# Patient Record
Sex: Female | Born: 1965 | Race: White | Hispanic: No
Health system: Southern US, Community
[De-identification: ages and names within clinical notes are randomized; demographics above are authoritative.]

## PROBLEM LIST (undated history)

## (undated) DIAGNOSIS — D649 Anemia, unspecified: Secondary | ICD-10-CM

## (undated) DIAGNOSIS — N189 Chronic kidney disease, unspecified: Secondary | ICD-10-CM

## (undated) DIAGNOSIS — Z780 Asymptomatic menopausal state: Secondary | ICD-10-CM

## (undated) DIAGNOSIS — W57XXXA Bitten or stung by nonvenomous insect and other nonvenomous arthropods, initial encounter: Secondary | ICD-10-CM

## (undated) DIAGNOSIS — I219 Acute myocardial infarction, unspecified: Secondary | ICD-10-CM

## (undated) DIAGNOSIS — C4490 Unspecified malignant neoplasm of skin, unspecified: Secondary | ICD-10-CM

## (undated) DIAGNOSIS — Z8742 Personal history of other diseases of the female genital tract: Secondary | ICD-10-CM

## (undated) DIAGNOSIS — F329 Major depressive disorder, single episode, unspecified: Secondary | ICD-10-CM

## (undated) DIAGNOSIS — N951 Menopausal and female climacteric states: Secondary | ICD-10-CM

## (undated) DIAGNOSIS — T7840XA Allergy, unspecified, initial encounter: Secondary | ICD-10-CM

## (undated) DIAGNOSIS — J449 Chronic obstructive pulmonary disease, unspecified: Secondary | ICD-10-CM

## (undated) DIAGNOSIS — R32 Unspecified urinary incontinence: Secondary | ICD-10-CM

## (undated) DIAGNOSIS — E079 Disorder of thyroid, unspecified: Secondary | ICD-10-CM

## (undated) DIAGNOSIS — F419 Anxiety disorder, unspecified: Secondary | ICD-10-CM

## (undated) DIAGNOSIS — M1990 Unspecified osteoarthritis, unspecified site: Secondary | ICD-10-CM

## (undated) DIAGNOSIS — F32A Depression, unspecified: Secondary | ICD-10-CM

## (undated) DIAGNOSIS — R011 Cardiac murmur, unspecified: Secondary | ICD-10-CM

## (undated) DIAGNOSIS — E7800 Pure hypercholesterolemia, unspecified: Secondary | ICD-10-CM

## (undated) DIAGNOSIS — C801 Malignant (primary) neoplasm, unspecified: Secondary | ICD-10-CM

## (undated) HISTORY — PX: ANKLE SURGERY: SHX546

## (undated) HISTORY — PX: APPENDECTOMY: SHX54

## (undated) HISTORY — DX: Acute myocardial infarction, unspecified: I21.9

## (undated) HISTORY — DX: Cardiac murmur, unspecified: R01.1

## (undated) HISTORY — DX: Pure hypercholesterolemia, unspecified: E78.00

## (undated) HISTORY — DX: Unspecified osteoarthritis, unspecified site: M19.90

## (undated) HISTORY — DX: Chronic obstructive pulmonary disease, unspecified: J44.9

## (undated) HISTORY — DX: Depression, unspecified: F32.A

## (undated) HISTORY — DX: Menopausal and female climacteric states: N95.1

## (undated) HISTORY — DX: Personal history of other diseases of the female genital tract: Z87.42

## (undated) HISTORY — DX: Disorder of thyroid, unspecified: E07.9

## (undated) HISTORY — PX: BUNIONECTOMY: SHX129

## (undated) HISTORY — PX: WISDOM TOOTH EXTRACTION: SHX21

## (undated) HISTORY — PX: AUGMENTATION MAMMAPLASTY: SUR837

## (undated) HISTORY — DX: Anxiety disorder, unspecified: F41.9

## (undated) HISTORY — PX: BREAST ENHANCEMENT SURGERY: SHX7

## (undated) HISTORY — DX: Allergy, unspecified, initial encounter: T78.40XA

## (undated) HISTORY — DX: Anemia, unspecified: D64.9

## (undated) HISTORY — DX: Major depressive disorder, single episode, unspecified: F32.9

## (undated) HISTORY — DX: Unspecified urinary incontinence: R32

## (undated) HISTORY — DX: Chronic kidney disease, unspecified: N18.9

## (undated) HISTORY — PX: TOTAL ABDOMINAL HYSTERECTOMY W/ BILATERAL SALPINGOOPHORECTOMY: SHX83

---
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
## 1998-04-06 ENCOUNTER — Emergency Department (HOSPITAL_COMMUNITY): Admission: EM | Admit: 1998-04-06 | Discharge: 1998-04-06 | Payer: Self-pay | Admitting: Internal Medicine

## 1998-09-07 ENCOUNTER — Emergency Department (HOSPITAL_COMMUNITY): Admission: EM | Admit: 1998-09-07 | Discharge: 1998-09-07 | Payer: Self-pay | Admitting: Emergency Medicine

## 1998-11-01 ENCOUNTER — Emergency Department (HOSPITAL_COMMUNITY): Admission: EM | Admit: 1998-11-01 | Discharge: 1998-11-01 | Payer: Self-pay

## 1999-06-29 ENCOUNTER — Emergency Department (HOSPITAL_COMMUNITY): Admission: EM | Admit: 1999-06-29 | Discharge: 1999-06-29 | Payer: Self-pay | Admitting: Emergency Medicine

## 2002-10-17 ENCOUNTER — Other Ambulatory Visit: Admission: RE | Admit: 2002-10-17 | Discharge: 2002-10-17 | Payer: Self-pay | Admitting: Family Medicine

## 2002-10-17 ENCOUNTER — Encounter: Payer: Self-pay | Admitting: Family Medicine

## 2002-10-17 LAB — CONVERTED CEMR LAB: Pap Smear: NORMAL

## 2004-11-23 ENCOUNTER — Ambulatory Visit: Payer: Self-pay | Admitting: Family Medicine

## 2004-12-07 ENCOUNTER — Encounter: Admission: RE | Admit: 2004-12-07 | Discharge: 2004-12-07 | Payer: Self-pay | Admitting: Family Medicine

## 2005-02-22 ENCOUNTER — Ambulatory Visit: Payer: Self-pay | Admitting: Family Medicine

## 2005-03-03 ENCOUNTER — Ambulatory Visit (HOSPITAL_COMMUNITY): Admission: RE | Admit: 2005-03-03 | Discharge: 2005-03-03 | Payer: Self-pay | Admitting: Gynecology

## 2005-03-03 ENCOUNTER — Encounter (INDEPENDENT_AMBULATORY_CARE_PROVIDER_SITE_OTHER): Payer: Self-pay | Admitting: *Deleted

## 2005-05-04 ENCOUNTER — Ambulatory Visit: Payer: Self-pay | Admitting: Family Medicine

## 2005-08-08 ENCOUNTER — Ambulatory Visit (HOSPITAL_COMMUNITY): Admission: RE | Admit: 2005-08-08 | Discharge: 2005-08-08 | Payer: Self-pay | Admitting: Gynecology

## 2005-08-11 ENCOUNTER — Encounter (INDEPENDENT_AMBULATORY_CARE_PROVIDER_SITE_OTHER): Payer: Self-pay | Admitting: Specialist

## 2005-08-11 ENCOUNTER — Ambulatory Visit (HOSPITAL_COMMUNITY): Admission: RE | Admit: 2005-08-11 | Discharge: 2005-08-11 | Payer: Self-pay | Admitting: Gynecology

## 2005-10-30 HISTORY — PX: OTHER SURGICAL HISTORY: SHX169

## 2005-12-08 ENCOUNTER — Ambulatory Visit: Payer: Self-pay | Admitting: Family Medicine

## 2006-02-19 ENCOUNTER — Ambulatory Visit: Payer: Self-pay | Admitting: Family Medicine

## 2006-03-19 ENCOUNTER — Ambulatory Visit: Payer: Self-pay | Admitting: Gynecology

## 2006-04-12 ENCOUNTER — Ambulatory Visit (HOSPITAL_COMMUNITY): Admission: RE | Admit: 2006-04-12 | Discharge: 2006-04-13 | Payer: Self-pay | Admitting: Gynecology

## 2006-04-13 ENCOUNTER — Ambulatory Visit: Payer: Self-pay | Admitting: Gynecology

## 2006-04-19 ENCOUNTER — Ambulatory Visit: Payer: Self-pay | Admitting: Family Medicine

## 2006-05-07 ENCOUNTER — Ambulatory Visit: Payer: Self-pay | Admitting: Gynecology

## 2006-05-07 ENCOUNTER — Ambulatory Visit: Payer: Self-pay | Admitting: Family Medicine

## 2006-06-01 ENCOUNTER — Ambulatory Visit: Payer: Self-pay | Admitting: Gynecology

## 2006-06-18 ENCOUNTER — Ambulatory Visit: Payer: Self-pay | Admitting: Gynecology

## 2006-07-11 ENCOUNTER — Ambulatory Visit: Payer: Self-pay | Admitting: Gynecology

## 2006-08-06 ENCOUNTER — Ambulatory Visit: Payer: Self-pay | Admitting: Gynecology

## 2006-08-23 ENCOUNTER — Ambulatory Visit: Payer: Self-pay | Admitting: Family Medicine

## 2006-09-04 ENCOUNTER — Ambulatory Visit: Payer: Self-pay | Admitting: Family Medicine

## 2006-09-10 ENCOUNTER — Ambulatory Visit: Payer: Self-pay | Admitting: Gynecology

## 2006-10-02 ENCOUNTER — Encounter: Admission: RE | Admit: 2006-10-02 | Discharge: 2006-10-02 | Payer: Self-pay | Admitting: Gynecology

## 2006-10-08 ENCOUNTER — Ambulatory Visit: Payer: Self-pay | Admitting: Gynecology

## 2006-11-08 ENCOUNTER — Ambulatory Visit: Payer: Self-pay | Admitting: Obstetrics & Gynecology

## 2006-12-10 ENCOUNTER — Ambulatory Visit: Payer: Self-pay | Admitting: Gynecology

## 2007-01-07 ENCOUNTER — Ambulatory Visit: Payer: Self-pay | Admitting: Gynecology

## 2007-02-07 ENCOUNTER — Ambulatory Visit: Payer: Self-pay | Admitting: Gynecology

## 2007-04-09 ENCOUNTER — Ambulatory Visit: Payer: Self-pay | Admitting: Gynecology

## 2007-05-09 ENCOUNTER — Encounter: Payer: Self-pay | Admitting: Family Medicine

## 2007-05-09 DIAGNOSIS — I059 Rheumatic mitral valve disease, unspecified: Secondary | ICD-10-CM | POA: Insufficient documentation

## 2007-05-09 DIAGNOSIS — I341 Nonrheumatic mitral (valve) prolapse: Secondary | ICD-10-CM | POA: Insufficient documentation

## 2007-05-09 DIAGNOSIS — G43109 Migraine with aura, not intractable, without status migrainosus: Secondary | ICD-10-CM | POA: Insufficient documentation

## 2007-05-13 ENCOUNTER — Ambulatory Visit: Payer: Self-pay | Admitting: Family Medicine

## 2007-05-13 DIAGNOSIS — R221 Localized swelling, mass and lump, neck: Secondary | ICD-10-CM | POA: Insufficient documentation

## 2007-05-13 DIAGNOSIS — R220 Localized swelling, mass and lump, head: Secondary | ICD-10-CM | POA: Insufficient documentation

## 2007-05-13 LAB — CONVERTED CEMR LAB
Free T4: 0.6 ng/dL (ref 0.6–1.6)
T3, Free: 2.2 pg/mL — ABNORMAL LOW (ref 2.3–4.2)
TSH: 1.75 microintl units/mL (ref 0.35–5.50)

## 2007-05-21 ENCOUNTER — Ambulatory Visit: Payer: Self-pay | Admitting: Family Medicine

## 2007-05-21 DIAGNOSIS — R5381 Other malaise: Secondary | ICD-10-CM | POA: Insufficient documentation

## 2007-05-21 DIAGNOSIS — R5383 Other fatigue: Secondary | ICD-10-CM

## 2007-09-09 ENCOUNTER — Ambulatory Visit: Payer: Self-pay | Admitting: Gynecology

## 2007-10-08 ENCOUNTER — Encounter: Admission: RE | Admit: 2007-10-08 | Discharge: 2007-10-08 | Payer: Self-pay | Admitting: Gynecology

## 2007-10-16 ENCOUNTER — Ambulatory Visit: Payer: Self-pay | Admitting: Family Medicine

## 2007-10-16 LAB — CONVERTED CEMR LAB: Inflenza A Ag: NEGATIVE

## 2007-12-10 ENCOUNTER — Ambulatory Visit: Payer: Self-pay | Admitting: Internal Medicine

## 2008-01-02 ENCOUNTER — Ambulatory Visit: Payer: Self-pay | Admitting: Family Medicine

## 2008-01-02 DIAGNOSIS — B9789 Other viral agents as the cause of diseases classified elsewhere: Secondary | ICD-10-CM | POA: Insufficient documentation

## 2008-01-15 ENCOUNTER — Telehealth (INDEPENDENT_AMBULATORY_CARE_PROVIDER_SITE_OTHER): Payer: Self-pay | Admitting: Internal Medicine

## 2008-01-16 ENCOUNTER — Ambulatory Visit: Payer: Self-pay | Admitting: Family Medicine

## 2008-02-11 ENCOUNTER — Ambulatory Visit: Payer: Self-pay | Admitting: Family Medicine

## 2008-02-21 ENCOUNTER — Encounter: Admission: RE | Admit: 2008-02-21 | Discharge: 2008-02-21 | Payer: Self-pay | Admitting: Family Medicine

## 2008-05-28 ENCOUNTER — Ambulatory Visit: Payer: Self-pay | Admitting: Family Medicine

## 2008-07-02 ENCOUNTER — Ambulatory Visit: Payer: Self-pay | Admitting: Family Medicine

## 2008-07-02 DIAGNOSIS — M25519 Pain in unspecified shoulder: Secondary | ICD-10-CM | POA: Insufficient documentation

## 2008-07-02 DIAGNOSIS — M629 Disorder of muscle, unspecified: Secondary | ICD-10-CM | POA: Insufficient documentation

## 2008-07-02 DIAGNOSIS — IMO0002 Reserved for concepts with insufficient information to code with codable children: Secondary | ICD-10-CM | POA: Insufficient documentation

## 2008-07-02 DIAGNOSIS — S4350XA Sprain of unspecified acromioclavicular joint, initial encounter: Secondary | ICD-10-CM | POA: Insufficient documentation

## 2008-07-02 DIAGNOSIS — M25569 Pain in unspecified knee: Secondary | ICD-10-CM | POA: Insufficient documentation

## 2008-07-08 ENCOUNTER — Ambulatory Visit: Payer: Self-pay | Admitting: Family Medicine

## 2008-07-09 ENCOUNTER — Telehealth (INDEPENDENT_AMBULATORY_CARE_PROVIDER_SITE_OTHER): Payer: Self-pay | Admitting: *Deleted

## 2008-07-12 ENCOUNTER — Encounter: Payer: Self-pay | Admitting: Family Medicine

## 2008-07-12 ENCOUNTER — Encounter: Admission: RE | Admit: 2008-07-12 | Discharge: 2008-07-12 | Payer: Self-pay | Admitting: Family Medicine

## 2008-07-15 ENCOUNTER — Encounter: Payer: Self-pay | Admitting: Family Medicine

## 2008-07-22 ENCOUNTER — Encounter (INDEPENDENT_AMBULATORY_CARE_PROVIDER_SITE_OTHER): Payer: Self-pay | Admitting: *Deleted

## 2008-09-14 ENCOUNTER — Ambulatory Visit: Payer: Self-pay | Admitting: Gynecology

## 2008-10-08 ENCOUNTER — Encounter: Admission: RE | Admit: 2008-10-08 | Discharge: 2008-10-08 | Payer: Self-pay | Admitting: Gynecology

## 2009-08-17 ENCOUNTER — Ambulatory Visit: Payer: Self-pay | Admitting: Obstetrics & Gynecology

## 2009-08-18 ENCOUNTER — Encounter: Payer: Self-pay | Admitting: Obstetrics & Gynecology

## 2010-02-07 ENCOUNTER — Encounter: Admission: RE | Admit: 2010-02-07 | Discharge: 2010-02-07 | Payer: Self-pay | Admitting: Obstetrics & Gynecology

## 2010-02-15 ENCOUNTER — Telehealth: Payer: Self-pay | Admitting: Family Medicine

## 2010-04-29 ENCOUNTER — Encounter: Admission: RE | Admit: 2010-04-29 | Discharge: 2010-04-29 | Payer: Self-pay | Admitting: Sports Medicine

## 2010-06-02 ENCOUNTER — Encounter (INDEPENDENT_AMBULATORY_CARE_PROVIDER_SITE_OTHER): Payer: Self-pay | Admitting: *Deleted

## 2010-11-20 ENCOUNTER — Encounter: Payer: Self-pay | Admitting: Gynecology

## 2010-11-21 ENCOUNTER — Encounter: Payer: Self-pay | Admitting: Obstetrics and Gynecology

## 2010-12-01 NOTE — Assessment & Plan Note (Signed)
Summary: ear infect.?/bir   Vital Signs:  Patient Profile:   45 Years Old Female Weight:      132 pounds Temp:     98.3 degrees F oral Pulse rate:   56 / minute Pulse rhythm:   regular BP sitting:   100 / 80  (right arm) Cuff size:   regular  Vitals Entered ByMarland Kitchen Providence Crosby (May 13, 2007 12:30 PM)               Chief Complaint:  RIGHT EAR PAIN.  History of Present Illness: R ear hurts down along the anterior cerv chain with balance problems...tripping over own feet., falling up steps approx one month.   Ear pain approx 1 week.  Current Allergies: No known allergies         Impression & Recommendations:  Problem # 1:  SYMPTOM, SWELLING IN HEAD/NECK (ICD-784.2) Assessment: New R/o Thyroid dz Orders: Ultrasound (Ultrasound) of thyroid. Venipuncture [phone]) TLB-TSH (Thyroid Stimulating Hormone) (84443-TSH) TLB-T3, Free (Triiodothyronine) (84481-T3FREE) TLB-T4 (Thyrox), Free [phone]) RTC 1 week.  Orders: Ultrasound (Ultrasound) Venipuncture (19147) TLB-TSH (Thyroid Stimulating Hormone) (84443-TSH) TLB-T3, Free (Triiodothyronine) (84481-T3FREE) TLB-T4 (Thyrox), Free [phone])   Problem # 2:  INFECTION, UP RESPIRAT, MLT SITES, ACUTE NOS (ICD-465.9) Assessment: New .guaifenesin as directed.    Advil 200 Mg Tabs (Ibuprofen) .Marland Kitchen... As needed Instructed on symptomatic treatment. Call if symptoms persist or worsen.  Her updated medication list for this problem includes:    Advil 200 Mg Tabs (Ibuprofen) .Marland Kitchen... As needed   Medications Added to Medication List This Visit: 1)  Hormone Injection  .... Every month 2)  Augmentin 500-125 Mg Tabs (Amoxicillin-pot clavulanate) .... T   Patient Instructions: 1)  GUAIFENESIN  600mg  by mouth AM and NOON   2)    ROBITUSSIN PLAIN (NO LETTERS, NO NAMES) two tablespoons  or 3)    [facility] MUCOUS RELIEF EXPECTORANT (400 mg) 11/2 TABS  or 4)    GUAIFENESIN (200 MG) 3 TABS  5)  Thyroid U/S                    6)  RTC 1  week.

## 2010-12-01 NOTE — Assessment & Plan Note (Signed)
Summary: HA X 3 DAYS/CLE   Vital Signs:  Patient Profile:   45 Years Old Female Weight:      130 pounds Temp:     98.2 degrees F oral Pulse rate:   73 / minute Pulse rhythm:   regular Resp:     16 per minute BP sitting:   107 / 76  (left arm) Cuff size:   regular  Vitals Entered By: Cooper Render (December 10, 2007 3:36 PM)                 Visit Type:  Acute PCP:  Hetty Ely  Chief Complaint:  migraine  x 3 days and vomiting x 3 yest & 2 x today.  History of Present Illness: Has had migraine headache x 3 days that has been accompanied by nausea and vomiting and has not responded to the Excedrin Migraine meds. Has had a little visual sx's as well. Rates pain as 7/10. Has been several months since patient had a migraine this bad. Patient lying on the exam table with the room darkened and her husband is sitting in the corner.    Prior Medication List:  CRESTOR 10 MG  TABS (ROSUVASTATIN CALCIUM) Take one by mouth daily ENJUVIA 1.25 MG  TABS (ESTROGENS CONJ SYNTHETIC B) Take one by mouth two times a day ADVIL 200 MG  TABS (IBUPROFEN) as needed EXCEDRIN MIGRAINE 250-250-65 MG  TABS (ASPIRIN-ACETAMINOPHEN-CAFFEINE) as needed AMOXICILLIN 500 MG  CAPS (AMOXICILLIN) 2 bid HYCODAN 5-1.5 MG/5ML  SYRP (HYDROCODONE-HOMATROPINE) 1 tsp at bedtime for cough, may repeat in 4-6h as needed.  Caution re drowsiness, no driving   Current Allergies (reviewed today): No known allergies   Past Surgical History:    Reviewed history from 05/21/2007 and no changes required:       Breast Augmentation 02/1999       Pelvic U/S, increased endometr. lining, RF Dr. 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       Siblings: Brother alive and well and a sister who died at 69 years of age of a motor vehicle accident and a sister with depression who has quit cocaine.  Social History:    Reviewed history from 05/09/2007 and no changes required:       Marital Status: Remarried       Children: 2 Children by her first marriage, 2 stepsons       Occupation: Housewife   Risk Factors:  Seatbelt use:  100 %   Review of Systems  The patient denies fever, chest pain, dyspnea on exhertion, peripheral edema, prolonged cough, abdominal pain, and severe indigestion/heartburn.         Complains of headache with nausea and vomiting x 3 days.   Physical Exam  General:  alert, well-developed, well-nourished, well-hydrated, appropriate dress, normal appearance, healthy-appearing, cooperative to examination, and good hygiene.   Neck:     supple and full ROM.   Lungs:     normal respiratory effort, no accessory muscle use, and normal breath sounds.   Heart:     normal rate and regular rhythm.   Neurologic:     cranial nerves III-XII intact and strength normal in all extremities.  Visual sx's. Psych:     Oriented X3 and subdued.  Continous headache x this is the third day.     Impression & Recommendations:  Problem # 1:  MIGRAINE HEADACHE (ICD-346.90) Onset 3 days ago with nausea and vomiting. Stadol 60 mg IM now. Phenergan 25 mg IM now. Phenergan 25 mg by mouth every 6 hours as needed nausea/ vomiting. Her updated medication list for this problem includes:    Advil 200 Mg Tabs (Ibuprofen) .Marland Kitchen... As needed    Excedrin Migraine 250-250-65 Mg Tabs (Aspirin-acetaminophen-caffeine) .Marland Kitchen... As needed  Orders: Ketorolac-Toradol 15mg  (Z6109) Promethazine up to 50mg  (J2550) Admin of  Therapeutic Inj  intramuscular or subcutaneous (60454)   Complete Medication List: 1)  Crestor 10 Mg Tabs (Rosuvastatin calcium) .... Take one by mouth daily 2)  Enjuvia 1.25 Mg Tabs (Estrogens conj synthetic b) .... Take one by mouth two times a day 3)  Advil 200 Mg Tabs (Ibuprofen) .... As needed 4)  Excedrin Migraine 250-250-65 Mg Tabs (Aspirin-acetaminophen-caffeine) .... As needed 5)  Promethazine Hcl 25 Mg Tabs (Promethazine hcl) .... Take 1 tab every 6 hours as needed for nausea.   Patient Instructions: 1)  Stadol 60 mg IM given. 2)  Phenergan 25 mg IM given. 3)  Go home and sleep. dark, quiet and cool place. 4)  Phenergan 25 mg by mouth every 6 hours as needed for nausea. 5)  Return to check as needed.    Prescriptions: PROMETHAZINE HCL 25 MG  TABS (PROMETHAZINE HCL) Take 1 tab every 6 hours as needed for nausea.  #12 x 0   Entered and Authorized by:   Kathie Rhodes NP   Signed by:   Kathie Rhodes NP on 12/10/2007   Method used:   Print then Give to Patient   RxID:   [id number]  ] Prior Medications (reviewed today): CRESTOR 10 MG  TABS (ROSUVASTATIN CALCIUM) Take one by mouth daily ENJUVIA 1.25 MG  TABS (ESTROGENS CONJ SYNTHETIC B) Take one by mouth two times a day ADVIL 200 MG  TABS (IBUPROFEN) as needed EXCEDRIN MIGRAINE 250-250-65 MG  TABS (ASPIRIN-ACETAMINOPHEN-CAFFEINE) as needed PROMETHAZINE HCL 25 MG  TABS (PROMETHAZINE HCL) Take 1 tab every 6 hours as needed for nausea. Current Allergies (reviewed today): No known allergies   Medication Administration  Injection # 1:    Medication: Ketorolac-Toradol 15mg     Diagnosis: MIGRAINE HEADACHE (ICD-346.90)    Route: IM    Site: LUOQ gluteus    Exp Date: 09/29/2008    Lot #: [id number]    Mfr: hospira    Comments: toradol 60 mg 2 ml    Patient tolerated injection without complications    Given by: Cooper Render (December 10, 2007 4:42 PM)  Injection # 2:    Medication: Promethazine up to 50mg      Diagnosis: MIGRAINE HEADACHE (ICD-346.90)    Route: IM    Site: RUOQ gluteus    Exp Date: 02/28/2008    Lot #: [id number]    Mfr: [email or web address]    Comments: 25 ml    Patient tolerated  injection without complications    Given by: Cooper Render (December 10, 2007 4:46 PM)

## 2010-12-01 NOTE — Letter (Signed)
Summary: [place] No Show Letter  [place] [facility]  [address] [place], [place] [address]   Phone: [phone]  Fax: [phone]    07/22/2008 MRN: [id number]  [name] [address] [place], [place]  [address]   Dear Ms. DILLS,   Our records indicate that you missed your scheduled appointment with Dr. Patsy Lager on July 22, 2008.  Please contact this office to reschedule your appointment as soon as possible.  It is important that you keep your scheduled appointments with your physician, so we can provide you the best care possible.  Please be advised that there may be a charge for "no show" appointments.    Sincerely,   [place] [facility]

## 2010-12-01 NOTE — Progress Notes (Signed)
Summary: Rx-Hydrocodone-APAP  Phone Note Refill Request Message from:  Fax from Pharmacy on July 09, 2008 8:56 AM  Not on med list. Hydrocodone-APAP 5/500mg  #30 take 1-2 tablets by mouth every 6 hours as needed for pain. [facility] [address] [phone]  Initial call taken by: Silas [place],  July 09, 2008 8:57 AM  Follow-up for Phone Call        I saw this patient yesterday and prescribed Ultram and Voltaren. Will not refill vicodin - discussed with pt. Follow-up by: Hannah Beat MD,  July 09, 2008 9:00 AM  Additional Follow-up for Phone Call Additional follow up Details #1::        called pharmacy and let them know that it was denied. Additional Follow-up by: Silas Sacramento,  July 09, 2008 9:17 AM

## 2010-12-01 NOTE — Progress Notes (Signed)
Summary: Referral to Dr. Sandria Manly  Phone Note Call from Patient Call back at [phone]   Caller: Patient Call For: Natalie Coombe, FNP Summary of Call: Pt needs a referral to Dr. Sandria Manly, a neurologist for her migraines. Initial call taken by: Sydell Axon,  January 15, 2008 4:56 PM  Follow-up for Phone Call        refer to Dr Sandria Manly for eval of migraine  ..................................................................Marland KitchenBillie-Khiley Tyler Deis FNP  January 15, 2008 5:42 PM

## 2010-12-01 NOTE — Assessment & Plan Note (Signed)
Summary: FLU? DLO   Vital Signs:  Patient Profile:   45 Years Old Female Weight:      134 pounds Temp:     97.9 degrees F oral Pulse rate:   66 / minute BP sitting:   144 / 84  (right arm) Cuff size:   regular  Vitals Entered By: Cooper Render (October 16, 2007 11:04 AM)                 Chief Complaint:  URI sx, cough, worse at night, fever, and dtr had a & b flu strain.  History of Present Illness: Here due to fever, cough--ribs hurt to cough, going on for 2 wks.  Has used Nyquil and Dayquil.  Getting worse. no wheezing.  Daughter had a pos flu test 3 wks ago.  Current Allergies (reviewed today): No known allergies  Updated/Current Medications (including changes made in today's visit):  CRESTOR 10 MG  TABS (ROSUVASTATIN CALCIUM) Take one by mouth daily ENJUVIA 1.25 MG  TABS (ESTROGENS CONJ SYNTHETIC B) Take one by mouth two times a day ADVIL 200 MG  TABS (IBUPROFEN) as needed EXCEDRIN MIGRAINE 250-250-65 MG  TABS (ASPIRIN-ACETAMINOPHEN-CAFFEINE) as needed AMOXICILLIN 500 MG  CAPS (AMOXICILLIN) 2 bid HYCODAN 5-1.5 MG/5ML  SYRP (HYDROCODONE-HOMATROPINE) 1 tsp at bedtime for cough, may repeat in 4-6h as needed.  Caution re drowsiness, no driving      Review of Systems      See HPI   Physical Exam  General:     alert, well-developed, and well-nourished.  NAD Eyes:     pupils equal, pupils round, and no injection.   Ears:     TMs retracted with increased fluid Nose:     injected with some crusting, sinuses +,- Mouth:     injected with no exudate Lungs:     moist harsh cough, non productive, no crackles and no wheezes.   Neurologic:     alert & oriented X3 and gait normal.   Skin:     turgor normal.   Psych:     normally interactive.      Impression & Recommendations:  Problem # 1:  BRONCHITIS-ACUTE (ICD-466.0) Assessment: New will continue comfort care measures: rest, increased by mouth fluids, tylenol/IBP will start Augmentin 875 1 two times a  day x 7d see back if not improved in 5-7d, sooner if worsens Flu swab neg The following medications were removed from the medication list:    Augmentin 500-125 Mg Tabs (Amoxicillin-pot clavulanate) .Marland Kitchen... T  Her updated medication list for this problem includes:    Amoxicillin 500 Mg Caps (Amoxicillin) .Marland Kitchen... 2 bid    Hycodan 5-1.5 Mg/65ml Syrp (Hydrocodone-homatropine) .Marland Kitchen... 1 tsp at bedtime for cough, may repeat in 4-6h as needed.  caution re drowsiness, no driving  Orders: EMR Electrical engineer Code [facility])   Complete Medication List: 1)  Crestor 10 Mg Tabs (Rosuvastatin calcium) .... Take one by mouth daily 2)  Enjuvia 1.25 Mg Tabs (Estrogens conj synthetic b) .... Take one by mouth two times a day 3)  Advil 200 Mg Tabs (Ibuprofen) .... As needed 4)  Excedrin Migraine 250-250-65 Mg Tabs (Aspirin-acetaminophen-caffeine) .... As needed 5)  Amoxicillin 500 Mg Caps (Amoxicillin) .... 2 bid 6)  Hycodan 5-1.5 Mg/5ml Syrp (Hydrocodone-homatropine) .Marland Kitchen.. 1 tsp at bedtime for cough, may repeat in 4-6h as needed.  caution re drowsiness, no driving  Other Orders: EMR Electrical engineer Code (EMRMisc)     Prescriptions: HYCODAN 5-1.5 MG/5ML  SYRP (HYDROCODONE-HOMATROPINE) 1 tsp  at bedtime for cough, may repeat in 4-6h as needed.  Caution re drowsiness, no driving  #045 ml x 0   Entered and Authorized by:   Gildardo Griffes FNP   Signed by:   Gildardo Griffes FNP on 10/16/2007   Method used:   Print then Give to Patient   RxID:   [id number] AMOXICILLIN 500 MG  CAPS (AMOXICILLIN) 2 bid  #40 x 0   Entered and Authorized by:   Gildardo Griffes FNP   Signed by:   Gildardo Griffes FNP on 10/16/2007   Method used:   Print then Give to Patient   RxID:   [id number]  ] Laboratory Results  Date/Time Received: 10/16/07 Date/Time Reported: 10/16/07  Other Tests  Influenza: negative

## 2010-12-01 NOTE — Assessment & Plan Note (Signed)
Summary: FOLLOW UP   Vital Signs:  Patient Profile:   45 Years Old Female Weight:      134 pounds Temp:     98.6 degrees F oral Pulse rate:   60 / minute Pulse rhythm:   regular BP sitting:   110 / 70  (left arm) Cuff size:   regular  Vitals Entered By: Providence Crosby (May 21, 2007 4:19 PM)               Chief Complaint:  f/u labs and thyroid ultrasound.  History of Present Illness: doing ok but still tired. Has put on almost 30 pounds since quitting smoking which was difficult and she has no desire to undo that effort. She admits to eating much of everything she wants. Was concerned about her thyroid with swellig of the left cubclavian/medial clavicle area. No other problems/complaints.  Current Allergies (reviewed today): No known allergies   Past Surgical History:    Breast Augmentation 02/1999    Pelvic U/S, increased endometr. lining, RF Dr. 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 No apparent alopecia or balding. Eyes:     Conjunctiva clear bilaterally.  Ears:     External ear exam shows no significant lesions or deformities.  Otoscopic examination reveals clear canals, tympanic membranes are intact bilaterally without bulging, retraction, inflammation or discharge. Hearing is grossly normal bilaterally. Nose:     External nasal examination shows no deformity or inflammation. Nasal mucosa are pink and moist without lesions or exudates. Mouth:  Oral mucosa and oropharynx without lesions or exudates.  Teeth in good repair. Neck:     No deformities, masses, or tenderness noted. Chest Wall:     No deformities, masses, or tenderness noted. Lungs:     Normal respiratory effort, chest expands symmetrically. Lungs are clear to auscultation, no crackles or wheezes. Heart:     Normal rate and regular rhythm. S1 and S2 normal without gallop, murmur, click, rub or other extra sounds. Msk:     No deformity or scoliosis noted of thoracic or lumbar spine.   Pulses:     R and L carotid,radial,femoral,dorsalis pedis and posterior tibial pulses are full and equal bilaterally Extremities:     No clubbing, cyanosis, edema, or deformity noted with normal full range of motion of all joints.   Skin:     Intact without suspicious lesions or rashes Cervical Nodes:     No lymphadenopathy noted    Impression & Recommendations:  Problem # 1:  SYMPTOM, MALAISE AND FATIGUE NEC (ICD-780.79) Assessment: Unchanged thyroid functions and thyroid U/S were nml. Presume this to be the result of reasonably rapid weight gain.  Long discussion about goals of reducing eating, what to eat and how, when to eat and idea and goals of regular exercise.   Patient Instructions: 1)  Please schedule a follow-up appointment as needed.

## 2010-12-01 NOTE — Progress Notes (Signed)
Summary: Rx Crestor  Phone Note Refill Request Call back at [phone] Message from:  [facility] on February 15, 2010 2:58 PM  Refills Requested: Medication #1:  CRESTOR 10 MG  TABS Take one by mouth daily   Last Refilled: 01/08/2010 PATIENT HAS NOT BEEN SEEN IN WAY OVER A YEAR. NO SHOWED FOR LAST APPT AND HAS CANCELLED SEVERAL APPTS. MESSAGE SENT TO PHARMACY AT LAST REFILL STATING MUST SCHEDULE AN APPT TO BE SEEN FOR FURTHER REFILLS   Method Requested: Electronic Initial call taken by: Sydell Axon LPN,  February 15, 2010 3:01 PM  Follow-up for Phone Call        Pt needs to be seen. Follow-up by: Shaune Leeks MD,  February 15, 2010 3:30 PM  Additional Follow-up for Phone Call Additional follow up Details #1::        Pharmacy notified as instructed. Additional Follow-up by: Sydell Axon LPN,  February 15, 2010 3:32 PM

## 2010-12-01 NOTE — Assessment & Plan Note (Signed)
Summary: acute/flu like symptoms/cmt   Vital Signs:  Patient Profile:   45 Years Old Female Weight:      121 pounds Temp:     98.1 degrees F oral Pulse rate:   70 / minute BP sitting:   101 / 71  (left arm) Cuff size:   regular  Vitals Entered By: Cooper Render (May 28, 2008 12:40 PM)                 PCP:  Hetty Ely  Chief Complaint:  fever, chills, achy, and bad HA  & cough.  History of Present Illness: Here with chills, cough--non-productive, ST, chillos--no fever, aching--for 1 wk Husband similarily sick with ST last week--resolved Taking Nyquil--makes legs ache--nothing now.     Updated Prior Medication List: CRESTOR 10 MG  TABS (ROSUVASTATIN CALCIUM) Take one by mouth daily ADVIL 200 MG  TABS (IBUPROFEN) as needed TOPAMAX 25 MG  TABS (TOPIRAMATE) take 2 tabs  at bedtime AMOXICILLIN 500 MG  CAPS (AMOXICILLIN) take 2 caps two times a day for bronchitis CLARINEX 5 MG  TABS (DESLORATADINE) 1 once daily for congestion by mouth [BMN]  Current Allergies (reviewed today): ! * NYQUIL     Review of Systems      See HPI   Physical Exam  General:     alert, well-developed, well-nourished, and well-hydrated.  NAD Ears:     TMs retracted with some fluid Nose:     no airflow obstruction, mucosal erythema, and mucosal edema.  sinuses tender throughout Mouth:     no exudates and pharyngeal erythema.   Lungs:     moist harsh cough, no crackles and no wheezes.   Cervical Nodes:     no anterior cervical adenopathy and no posterior cervical adenopathy.   Psych:     normally interactive.      Impression & Recommendations:  Problem # 1:  COUGH (ICD-786.2) Assessment: New continue comfort care measures: increase po fluids, rest, tylenol or IBP as needed will start on Amoxicillin two times a day x7d see back if not improved in 5d  Complete Medication List: 1)  Crestor 10 Mg Tabs (Rosuvastatin calcium) .... Take one by mouth daily 2)  Advil 200 Mg Tabs  (Ibuprofen) .... As needed 3)  Topamax 25 Mg Tabs (Topiramate) .... Take 2 tabs  at bedtime 4)  Amoxicillin 500 Mg Caps (Amoxicillin) .... Take 2 caps two times a day for bronchitis 5)  Clarinex 5 Mg Tabs (Desloratadine) .Marland Kitchen.. 1 once daily for congestion by mouth 6)  Imitrex Statdose Refill 6 Mg/0.6ml Kit (Sumatriptan succinate) .Marland Kitchen.. 1 at onset of ha, repeat in 2 h if not resolved    Prescriptions: CLARINEX 5 MG  TABS (DESLORATADINE) 1 once daily for congestion by mouth Brand medically necessary #5 x 0   Entered and Authorized by:   Gildardo Griffes FNP   Signed by:   Gildardo Griffes FNP on 05/28/2008   Method used:   Print then Give to Patient   RxID:   [id number] AMOXICILLIN 500 MG  CAPS (AMOXICILLIN) take 2 caps two times a day for bronchitis  #28 x 0   Entered and Authorized by:   Gildardo Griffes FNP   Signed by:   Gildardo Griffes FNP on 05/28/2008   Method used:   Print then Give to Patient   RxID:   [id number]  ] Prior Medications (reviewed today): CRESTOR 10 MG  TABS (ROSUVASTATIN CALCIUM) Take one by mouth daily ADVIL 200  MG  TABS (IBUPROFEN) as needed TOPAMAX 25 MG  TABS (TOPIRAMATE) take 2 tabs  at bedtime Current Allergies (reviewed today): ! * NYQUIL

## 2010-12-01 NOTE — Assessment & Plan Note (Signed)
Summary: MIGRAINE HA/CLE   Vital Signs:  Patient Profile:   45 Years Old Female Weight:      130 pounds Temp:     98.1 degrees F oral Pulse rate:   68 / minute BP sitting:   102 / 70  (right arm) Cuff size:   regular  Vitals Entered By: Cooper Render (January 16, 2008 11:01 AM)                 PCP:  Hetty Ely  Chief Complaint:  migraine HA, took maxalt x 2, and no help.  History of Present Illness: Here for follow up of migraine.  Having one now--started 2 dago, took Excedrin migraine 2 tabs  x2 no help(usually helps)--on 01/14/08. --took Maxalt Tabs 2 in 2hrs on 3/18--helped but did not go away. --Here today with HA that is no better --Had blood vessel break in L eye 2wks ago, saw eye doc--due to inflamation, gave eyegtts that had steriod in it.  --stopped smoking 2 yrs ago, 8oz of pepai once daily--no other caffeine.  Has had headaches q2wks since 1/09--denies problems in marriage, new dog in 1/09--will get rid of due to husbands allergies, no other changes.  Not working. Finances are stressed--husband manages the money and does the shopping due toher anxiety in crowded places.    Current Allergies (reviewed today): ! * NYQUIL     Review of Systems      See HPI   Physical Exam  General:     alert, well-developed, well-nourished, and well-hydrated.  lying in dark exam room Eyes:     EOMs full, pupils equal, pupils round, no injection, and no nystagmus.   Neck:     normal carotid upstroke and no carotid bruits.   Lungs:     normal respiratory effort, no intercostal retractions, no accessory muscle use, and normal breath sounds.   Heart:     normal rate, regular rhythm, and no murmur.   Neurologic:     alert & oriented X3, cranial nerves II-XII intact, strength normal in all extremities, sensation intact to light touch, gait normal, and finger-to-nose normal.   Skin:     turgor normal, color normal, and no rashes.   Psych:     normally interactive, good eye  contact, flat affect, and subdued.      Impression & Recommendations:  Problem # 1:  MIGRAINE HEADACHE (ICD-346.90) Assessment: New new 2d migraine, with little to no releif from Excedrin Migraine and Maxalt , which hs sworked in the past. will try Subcutaneously Imitrex for migraine---improved in and gone in gave samples of Treximet --gave instructions for todayand for future--call response gave HA calanders to keep o3 mo see back as needed The following medications were removed from the medication list:    Excedrin Migraine 250-250-65 Mg Tabs (Aspirin-acetaminophen-caffeine) .Marland Kitchen... As needed    Maxalt 10 Mg Tabs (Rizatriptan benzoate) .Marland Kitchen... 1 at onset of migraine  Her updated medication list for this problem includes:    Advil 200 Mg Tabs (Ibuprofen) .Marland Kitchen... As needed    Treximet 85-500 Mg Tabs (Sumatriptan-naproxen sodium) .Marland Kitchen... 1 at onset of headache and repeat in 2 hr as needed  Orders: EMR miscellaneous medications (EMRORAL) Admin of Therapeutic Inj  intramuscular or subcutaneous (04540)   Complete Medication List: 1)  Crestor 10 Mg Tabs (Rosuvastatin calcium) .... Take one by mouth daily 2)  Enjuvia 1.25 Mg Tabs (Estrogens conj synthetic b) .... Take one by mouth two times a day 3)  Advil 200 Mg Tabs (Ibuprofen) .... As needed 4)  Veramyst 27.5 Mcg/spray Susp (Fluticasone furoate) .... 2 sprays each nostril once daily for congestion 5)  Treximet 85-500 Mg Tabs (Sumatriptan-naproxen sodium) .Marland Kitchen.. 1 at onset of headache and repeat in 2 hr as needed     ] Prior Medications (reviewed today): CRESTOR 10 MG  TABS (ROSUVASTATIN CALCIUM) Take one by mouth daily ENJUVIA 1.25 MG  TABS (ESTROGENS CONJ SYNTHETIC B) Take one by mouth two times a day ADVIL 200 MG  TABS (IBUPROFEN) as needed VERAMYST 27.5 MCG/SPRAY  SUSP (FLUTICASONE FUROATE) 2 sprays each nostril once daily for congestion Current Allergies (reviewed today): ! * NYQUIL   Medication  Administration  Injection # 1:    Medication: EMR miscellaneous medications    Diagnosis: MIGRAINE HEADACHE (ICD-346.90)    Route: SQ    Site: R deltoid    Exp Date: 06/30/2009    Lot #: [id number]    Mfr: [email or web address]    Comments: Imitrex 6 mg sq    Patient tolerated injection without complications    Given by: Cooper Render (January 16, 2008 11:49 AM)  Orders Added: 1)  EMR miscellaneous medications [EMRORAL] 2)  Admin of Therapeutic Inj  intramuscular or subcutaneous [96372] 3)  Est. Patient Level III [14782]   Medication Administration  Injection # 1:    Medication: EMR miscellaneous medications    Diagnosis: MIGRAINE HEADACHE (ICD-346.90)    Route: SQ    Site: R deltoid    Exp Date: 06/30/2009    Lot #: [id number]    Mfr: [email or web address]    Comments: Imitrex 6 mg sq    Patient tolerated injection without complications    Given by: Cooper Render (January 16, 2008 11:49 AM)  Orders Added: 1)  EMR miscellaneous medications [EMRORAL] 2)  Admin of Therapeutic Inj  intramuscular or subcutaneous [96372] 3)  Est. Patient Level III [86578]

## 2010-12-01 NOTE — Letter (Signed)
Summary: Nadara Segundo letter  [place] [facility]  [address] [place], [place] [address]   Phone: [phone]  Fax: [phone]       06/02/2010 MRN: [id number]  MIGUEL MEDAL [address] [place], [place]  [address]  Dear Ms. [facility] [place], and [place] announce the retirement of Arta Silence, M.D., from full-time practice at the [facility] office effective April 28, 2010 and his plans of returning part-time.  It is important to Dr. Hetty Ely and to our practice that you understand that [facility] has seven physicians in our office for your health care needs.  We will continue to offer the same exceptional care that you have today.    Dr. Hetty Ely has spoken to many of you about his plans for retirement and returning part-time in the fall.   We will continue to work with you through the transition to schedule appointments for you in the office and meet the high standards that [facility] is committed to.   Again, it is with great pleasure that we share the news that Dr. Hetty Ely will return to [facility] at [facility] in October of 2011 with a reduced schedule.    If you have any questions, or would like to request an appointment with one of our physicians, please call us at [phone] and press the option for Scheduling an appointment.  We take pleasure in providing you with excellent patient care and look forward to seeing you at your next office visit.  Our [facility] Physicians are:  Tillman Abide, M.D. Laurita Quint, M.D. Roxy Manns, M.D. Kerby Nora, M.D. Hannah Beat, M.D. Ruthe Mannan, M.D. We proudly welcomed Raechel Ache, M.D. and Eustaquio Boyden, M.D. to the practice in July/August 2011.  Sincerely,  [facility]

## 2010-12-01 NOTE — Miscellaneous (Signed)
Summary: Orders Update  Clinical Lists Changes  Orders: Added new Referral order of Orthopedic Surgeon Referral (Ortho Surgeon) - Signed 

## 2010-12-01 NOTE — Assessment & Plan Note (Signed)
Summary: ?FLU   Vital Signs:  Patient Profile:   45 Years Old Female Weight:      131 pounds Temp:     98.4 degrees F oral Pulse rate:   79 / minute BP sitting:   106 / 79  (left arm) Cuff size:   regular  Vitals Entered By: Cooper Render (January 02, 2008 2:14 PM)                 PCP:  Hetty Ely  Chief Complaint:  URI sx, ears hurt, fever 102.1 this am, and vomiting.  History of Present Illness: Here due to fever/chills, vomiting, aching--onset x 36h.  Tried Nyquil--makes legs hurt.  Took IBP this AM for fever--102.1.  Skin hurts, no cough, runny nose and ears hurts, HA. No work yesterday or today--in bed,    Current Allergies (reviewed today): ! * NYQUIL     Review of Systems      See HPI   Physical Exam  General:     alert, well-developed, well-nourished, and well-hydrated.  sick Eyes:     pupils equal, pupils round, and no injection.   Ears:     TMs retracted with increased fluid bilat Nose:     mucosal erythema, mucosal edema, and airflow obstruction.  mouth breathing Mouth:     no exudates and pharyngeal erythema.   Lungs:     moist harsh cough, no crackles and no wheezes.   Neurologic:     alert & oriented X3 and gait normal.   Skin:     turgor normal, color normal, and no rashes.   Cervical Nodes:     no anterior cervical adenopathy and no posterior cervical adenopathy.   Psych:     normally interactive and good eye contact.      Impression & Recommendations:  Problem # 1:  VIRAL INFECTION (ICD-079.99) Assessment: New continue comfort care measures: increase po fluids, rest, tylenol or IBP as needed due to severd edema of nose, will start Veramyst nasal spray 2qd each nostril--sample and demo start Clarinex 1 once daily see back if not improved in 5=7d Her updated medication list for this problem includes:    Advil 200 Mg Tabs (Ibuprofen) .Marland Kitchen... As needed   Complete Medication List: 1)  Crestor 10 Mg Tabs (Rosuvastatin calcium) .... Take  one by mouth daily 2)  Enjuvia 1.25 Mg Tabs (Estrogens conj synthetic b) .... Take one by mouth two times a day 3)  Advil 200 Mg Tabs (Ibuprofen) .... As needed 4)  Excedrin Migraine 250-250-65 Mg Tabs (Aspirin-acetaminophen-caffeine) .... As needed 5)  Maxalt 10 Mg Tabs (Rizatriptan benzoate) .Marland Kitchen.. 1 at onset of migraine 6)  Clarinex 5 Mg Tabs (Desloratadine) .Marland Kitchen.. 1 once daily as needed congestion by mouth 7)  Veramyst 27.5 Mcg/spray Susp (Fluticasone furoate) .... 2 sprays each nostril once daily for congestion     Prescriptions: CLARINEX 5 MG  TABS (DESLORATADINE) 1 once daily as needed congestion by mouth Brand medically necessary #5 x 0   Entered and Authorized by:   Gildardo Griffes FNP   Signed by:   Gildardo Griffes FNP on 01/02/2008   Method used:   Print then Give to Patient   RxID:   [id number]  ] Prior Medications (reviewed today): CRESTOR 10 MG  TABS (ROSUVASTATIN CALCIUM) Take one by mouth daily ENJUVIA 1.25 MG  TABS (ESTROGENS CONJ SYNTHETIC B) Take one by mouth two times a day ADVIL 200 MG  TABS (IBUPROFEN) as needed EXCEDRIN  MIGRAINE 250-250-65 MG  TABS (ASPIRIN-ACETAMINOPHEN-CAFFEINE) as needed MAXALT 10 MG  TABS (RIZATRIPTAN BENZOATE) 1 at onset of migraine Current Allergies (reviewed today): ! * NYQUIL

## 2010-12-01 NOTE — Assessment & Plan Note (Signed)
Summary: R SHOULDER PAIN/CLE   Vital Signs:  Patient Profile:   45 Years Old Female Weight:      122.25 pounds (55.57 kg) Temp:     98.0 degrees F (36.67 degrees C) oral Pulse rate:   68 / minute Pulse rhythm:   regular BP sitting:   120 / 80  (left arm) Cuff size:   regular  Vitals Entered By: Silas Sacramento (July 02, 2008 12:10 PM)                 PCP:  Hetty Ely  Chief Complaint:  Right shoulder pain.  History of Present Illness: I saw [name] in the office today for an initial visit.  [name] is a 45 years old woman with the complaint of:  right shoulder pain. The patient fell earlier today and fell on the point of her shoulder. [name] now has a significant amount of tenderness in the anterior portion of her shoulder. [name]'s not having any swelling or  ecchymosis currently. [name] denies any prior shoulder injury or fracture on this side. [name]'s not had any prior ligamentous or tendon tear on that side.[name] denies any prior dislocation or subluxation. [name] denies any history of shoulder separation.  Right hand dominant    Current Allergies: ! * NYQUIL  Past Surgical History:    Reviewed history from 05/21/2007 and no changes required:       Breast Augmentation 02/1999       Pelvic U/S, increased endometr. lining, RF Dr. 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       Siblings: Brother alive and well and a sister who died at 24 years of age of a motor vehicle accident and a  sister with depression who has quit cocaine.  Social History:    Reviewed history from 05/09/2007 and no changes required:       Marital Status: Remarried       Children: 2 Children by her first marriage, 2 stepsons       Occupation: Housewife    Review of Systems       For overall ROS, please see HPI. patient denies fevers, chills, myalgias, chest pain, shortness of breath. does complain some of HA.    Physical Exam  General:     Well-developed,well-nourished,in no acute distress; alert,appropriate and cooperative throughout examination Head:     Normocephalic and atraumatic without obvious abnormalities. No apparent alopecia or balding. Ears:     no external deformities.   Nose:     no external deformity.   Lungs:     normal respiratory effort.   Msk:     Shoulder:R Inspection: No muscle wasting or winging Ecchymosis/edema: neg  AC joint, scapula, clavicle: very tender at [facility] joint and  portion of shoulder just anterior and inferior to [facility] joint Cervical spine: NT, full ROM Abduction: limited, painful, 3/5 Flexion: limited by pain IR, full, lift-off: limited by pain ER at neutral: limited by paintechnique via a hot heat and a half minutes before meals and on for Sulcus sign: neg Scapular dyskinesis: none C5-T1 intact Sensation intact   Neurologic:     alert & oriented X3 and gait normal.   Additional Exam:     XR Shoulder, R Indication: pain Findings: no evidence of acute fracture or dislocationher    Impression & Recommendations:  Problem # 1:  SHOULDER PAIN, RIGHT (ICD-719.41) Assessment: New Ibuprofen 800 mg by mouth three times a day. no sign of fracture and clinical indication of probable a.c. joint separation. There is also a likely rotator cuff contusion. At this point can't rule out a rotator cuff tear, and I will have her followup next week.  If at that point [name] still has significantly limited abduction and flexion, an MRI of her shoulder is  warranted.  The following medications were removed from the medication list:    Advil 200 Mg Tabs (Ibuprofen) .Marland Kitchen... As needed  Her updated medication list for this problem includes:    Hydrocodone-acetaminophen 5-500 Mg Tabs (Hydrocodone-acetaminophen) .Marland Kitchen... 1-2 tabs by mouth q 6 hours as needed pain  Orders: Radiology other (Radiology Other) Slings- Al  Types (Z6109)   Problem # 2:  ACROMIOCLAVICULAR SPRAIN AND STRAIN (ICD-840.0) DOI 07/02/08  sling 3-4 days, pendulums, ROM exercises  Complete Medication List: 1)  Crestor 10 Mg Tabs (Rosuvastatin calcium) .... Take one by mouth daily 2)  Topamax 25 Mg Tabs (Topiramate) .... Take 2 tabs  at bedtime 3)  Hydrocodone-acetaminophen 5-500 Mg Tabs (Hydrocodone-acetaminophen) .Marland Kitchen.. 1-2 tabs by mouth q 6 hours as needed pain    Prescriptions: HYDROCODONE-ACETAMINOPHEN 5-500 MG TABS (HYDROCODONE-ACETAMINOPHEN) 1-2 tabs by mouth q 6 hours as needed pain  #30 x 0   Entered and Authorized by:   Hannah Beat MD   Signed by:   Hannah Beat MD on 07/02/2008   Method used:   Print then Give to Patient   RxID:   [id number]  ]

## 2010-12-01 NOTE — Assessment & Plan Note (Signed)
Summary: [name]   Vital Signs:  Patient Profile:   45 Years Old Female Weight:      123 pounds Temp:     98 degrees F oral Pulse rate:   64 / minute Pulse rhythm:   regular BP sitting:   100 / 72  (left arm) Cuff size:   regular  Vitals Entered By: Lowella Petties (July 08, 2008 11:57 AM)                 PCP:  Hetty Ely  Chief Complaint:  Follow up with right shoulder.  History of Present Illness:    I saw [name] in the office today for follow-up visit today.  She is a 45 years old woman with the complaint of:  right shoulder pain. The patient fell last week and fell on the point of her shoulder. She now has a significant amount of tenderness in the anterior portion of her shoulder. She is having some slight amount of swelling on the posterior aspect of her shoulder. She denies any prior shoulder injury or fracture on this side. She's not had any prior ligamentous or tendon tear on that side.she denies any prior dislocation or subluxation. She denies any history of shoulder separation.  she left town last weekend, did have significant amount of difficulty doing her fishing activities. She is limited significantly on her range of motion. At this point she is having difficulty abducting her shoulder unassisted.  Shoulder x-rays were reviewed on her last visit, and these were negative. For any particular fracture.  Right hand dominant    Current Allergies: ! * NYQUIL  Past Surgical History:    Reviewed history from 05/21/2007 and no changes required:       Breast Augmentation 02/1999       Pelvic U/S, increased endometr. lining, RF Dr. Mia Creek 07/28/99       CT Head wnl 01/18/00       Stress Cardiolite wnl 07/26/99       ECHO, mild MVP, mild M. R. 07/21/99       Part Hyst L Ovary intactDysmennorhea  07/1999       Pelvic U/S wnl, left ovary intact o/w negative 04/25/04       Bladder tack 06/07       Limited Left Supraclavicular U/S nml  08/24/2006   Social History:    Reviewed history from 05/09/2007 and no changes required:       Marital Status: Remarried       Children: 2 Children by her first marriage, 2 stepsons       Occupation: Housewife    Review of Systems  General      Denies chills and fever.  MS      Complains of joint pain, joint swelling, loss of strength, muscle aches, muscle, cramps, muscle weakness, and stiffness.      Denies joint redness and mid back pain.  Neuro      complaint is some mild numbness in her right sided 2 and third fingers as well.  Heme      bruising noted at the site of her fall, including a right-sided hip region and also with abundant scrapes on her left knee.   Physical Exam  General:     Well-developed,well-nourished,in no acute distress; alert,appropriate and cooperative throughout examination Head:     Normocephalic and atraumatic without obvious abnormalities. No apparent alopecia or balding. Ears:     no external deformities.  Nose:     no external deformity.   Lungs:     normal respiratory effort.   Msk:     Shoulder:R Inspection: No muscle wasting or winging Ecchymosis/edema: neg  AC joint, scapula, clavicle: mildly tender at [facility] joint and portion of shoulder just anterior and inferior to [facility] joint, at the point of supraspinatus insertion Cervical spine: NT, full ROM Abduction: limited, painful, 3/5 Abduction with thumb up in scapular plane 3/5 Flexion: limited by pain Drop arm - significant pain, but not abruptly dropped Modified lift-off, 5/5 ER at neutral: 3+/5 Sulcus sign: neg C5-T1 intact Sensation intact   Extremities:     No clubbing, cyanosis, edema, or deformity noted with normal full range of motion of all joints.      Impression & Recommendations:  Problem # 1:  SHOULDER PAIN, RIGHT (ICD-719.41) Assessment: Unchanged 1. Obtain an MRI of the right shoulder. clinically, there is concern for potential full-thickness supraspinatus  tear. Clinically, infraspinatus is involved as well.  2. the patient is to followup in 2 weeks. Her MRI will help delineate whether or not this is a rehabable injury or if she will need operative intervention.  Date of Injury: 07/02/08  we reviewed range of motion exercises including pendulums, flexion and abduction range of motion along the wall.  The following medications were removed from the medication list:    Hydrocodone-acetaminophen 5-500 Mg Tabs (Hydrocodone-acetaminophen) .Marland Kitchen... 1-2 tabs by mouth q 6 hours as needed pain  Her updated medication list for this problem includes:    Tramadol Hcl 50 Mg Tabs (Tramadol hcl) .Marland Kitchen... 1 by mouth qid as needed pain    Diclofenac Sodium 75 Mg Tbec (Diclofenac sodium) .Marland Kitchen... 1 by mouth two times a day  Orders: Radiology Referral (Radiology)   Problem # 2:  ACROMIOCLAVICULAR SPRAIN AND STRAIN (ICD-840.0) Assessment: Unchanged  Complete Medication List: 1)  Crestor 10 Mg Tabs (Rosuvastatin calcium) .... Take one by mouth daily 2)  Topamax 25 Mg Tabs (Topiramate) .... Take 2 tabs  at bedtime 3)  Tramadol Hcl 50 Mg Tabs (Tramadol hcl) .Marland Kitchen.. 1 by mouth qid as needed pain 4)  Diclofenac Sodium 75 Mg Tbec (Diclofenac sodium) .Marland Kitchen.. 1 by mouth two times a day   Patient Instructions: 1)  Stop by to see Shirlee Limerick to set up MRI 2)  f/u 2 weeks   Prescriptions: DICLOFENAC SODIUM 75 MG TBEC (DICLOFENAC SODIUM) 1 by mouth two times a day  #60 x 3   Entered and Authorized by:   Hannah Beat MD   Signed by:   Hannah Beat MD on 07/08/2008   Method used:   Print then Give to Patient   RxID:   [id number] TRAMADOL HCL 50 MG  TABS (TRAMADOL HCL) 1 by mouth qid as needed pain  #90 x 2   Entered and Authorized by:   Hannah Beat MD   Signed by:   Hannah Beat MD on 07/08/2008   Method used:   Print then Give to Patient   RxID:   [id number]  ]

## 2011-02-01 ENCOUNTER — Other Ambulatory Visit: Payer: Self-pay | Admitting: Obstetrics & Gynecology

## 2011-02-01 DIAGNOSIS — Z1231 Encounter for screening mammogram for malignant neoplasm of breast: Secondary | ICD-10-CM

## 2011-02-13 ENCOUNTER — Emergency Department (HOSPITAL_COMMUNITY)
Admission: EM | Admit: 2011-02-13 | Discharge: 2011-02-13 | Disposition: A | Discharge status: Home / Self Care | Payer: 59 | Attending: Emergency Medicine | Admitting: Emergency Medicine

## 2011-02-13 DIAGNOSIS — E7800 Pure hypercholesterolemia, unspecified: Secondary | ICD-10-CM | POA: Insufficient documentation

## 2011-02-13 DIAGNOSIS — R509 Fever, unspecified: Secondary | ICD-10-CM | POA: Insufficient documentation

## 2011-02-13 DIAGNOSIS — M542 Cervicalgia: Secondary | ICD-10-CM | POA: Insufficient documentation

## 2011-02-13 DIAGNOSIS — IMO0001 Reserved for inherently not codable concepts without codable children: Secondary | ICD-10-CM | POA: Insufficient documentation

## 2011-02-13 DIAGNOSIS — J029 Acute pharyngitis, unspecified: Secondary | ICD-10-CM | POA: Insufficient documentation

## 2011-02-13 DIAGNOSIS — R110 Nausea: Secondary | ICD-10-CM | POA: Insufficient documentation

## 2011-02-13 DIAGNOSIS — J3489 Other specified disorders of nose and nasal sinuses: Secondary | ICD-10-CM | POA: Insufficient documentation

## 2011-02-13 DIAGNOSIS — R221 Localized swelling, mass and lump, neck: Secondary | ICD-10-CM | POA: Insufficient documentation

## 2011-02-13 DIAGNOSIS — R220 Localized swelling, mass and lump, head: Secondary | ICD-10-CM | POA: Insufficient documentation

## 2011-02-13 LAB — URINALYSIS, ROUTINE W REFLEX MICROSCOPIC
Bilirubin Urine: NEGATIVE
Glucose, UA: NEGATIVE mg/dL
Hgb urine dipstick: NEGATIVE
Ketones, ur: 15 mg/dL — AB
Nitrite: NEGATIVE
Protein, ur: NEGATIVE mg/dL
Specific Gravity, Urine: 1.023 (ref 1.005–1.030)
Urobilinogen, UA: 0.2 mg/dL (ref 0.0–1.0)
pH: 6 (ref 5.0–8.0)

## 2011-02-13 LAB — DIFFERENTIAL
Basophils Absolute: 0 10*3/uL (ref 0.0–0.1)
Basophils Relative: 0 % (ref 0–1)
Eosinophils Absolute: 0 10*3/uL (ref 0.0–0.7)
Eosinophils Relative: 0 % (ref 0–5)
Lymphocytes Relative: 10 % — ABNORMAL LOW (ref 12–46)
Lymphs Abs: 0.7 10*3/uL (ref 0.7–4.0)
Monocytes Absolute: 0.6 10*3/uL (ref 0.1–1.0)
Monocytes Relative: 8 % (ref 3–12)
Neutro Abs: 6.2 10*3/uL (ref 1.7–7.7)
Neutrophils Relative %: 82 % — ABNORMAL HIGH (ref 43–77)

## 2011-02-13 LAB — POCT I-STAT, CHEM 8
BUN: 8 mg/dL (ref 6–23)
Calcium, Ion: 1.14 mmol/L (ref 1.12–1.32)
Chloride: 103 mEq/L (ref 96–112)
Creatinine, Ser: 1.1 mg/dL (ref 0.4–1.2)
Glucose, Bld: 101 mg/dL — ABNORMAL HIGH (ref 70–99)
HCT: 42 % (ref 36.0–46.0)
Hemoglobin: 14.3 g/dL (ref 12.0–15.0)
Potassium: 3.8 mEq/L (ref 3.5–5.1)
Sodium: 137 mEq/L (ref 135–145)
TCO2: 25 mmol/L (ref 0–100)

## 2011-02-13 LAB — CBC
HCT: 39.5 % (ref 36.0–46.0)
Hemoglobin: 13.4 g/dL (ref 12.0–15.0)
MCH: 29.9 pg (ref 26.0–34.0)
MCHC: 33.9 g/dL (ref 30.0–36.0)
MCV: 88.2 fL (ref 78.0–100.0)
Platelets: 199 10*3/uL (ref 150–400)
RBC: 4.48 MIL/uL (ref 3.87–5.11)
RDW: 13.3 % (ref 11.5–15.5)
WBC: 7.5 10*3/uL (ref 4.0–10.5)

## 2011-02-13 LAB — RAPID STREP SCREEN (MED CTR MEBANE ONLY): Streptococcus, Group A Screen (Direct): NEGATIVE

## 2011-02-22 ENCOUNTER — Ambulatory Visit
Admission: RE | Admit: 2011-02-22 | Discharge: 2011-02-22 | Disposition: A | Discharge status: Home / Self Care | Payer: 59 | Source: Ambulatory Visit | Attending: Obstetrics & Gynecology | Admitting: Obstetrics & Gynecology

## 2011-02-22 DIAGNOSIS — Z1231 Encounter for screening mammogram for malignant neoplasm of breast: Secondary | ICD-10-CM

## 2011-03-14 NOTE — Assessment & Plan Note (Signed)
NAMEDEBBE, CRUMBLE                  ACCOUNT NO.:  [id number]   MEDICAL RECORD NO.:  [id number]          PATIENT TYPE:  POB   LOCATION:  [facility] at [facility]         FACILITY:  [facility]   PHYSICIAN:  Allie Bossier, MD        DATE OF BIRTH:  [birth year]   DATE OF SERVICE:                                  CLINIC NOTE   [name] is a 45 year old married white gravida 2, para 2.  She has 43-  year-old and 2 year old children.  She comes in here for annual exam.  Her main complaint today is that of losing urine with any kind of  lifting or movements, even walking.  She said that she had her tension-  free vaginal tape in 2007, and for about 2 years she had no incontinence  and then over the last year and a half it has progressively gotten  worse.  She denies dysuria currently.   PAST MEDICAL HISTORY:  Depression, stress incontinence, high  cholesterol, and migraines.   PAST SURGICAL HISTORY:  She had a TAHBSO, TVT in 2007, and appendectomy.  She has had saline breast implants.   REVIEW OF SYSTEMS:  Her family practice is [facility].  She has been married for last 10 years.  She has occasional  positional dyspareunia.  She is a Futures trader.  Mammogram was done this  year and gets done approximately every 6 months.  She has had  approximately 20-pound weight gain in the last year and her family  doctor has followed for thyroid.   FAMILY HISTORY:  Positive for diabetes but no breast, GYN, or colon  malignancies.   SOCIAL HISTORY:  She quit smoking in 2007.  She drinks occasionally.  Denies drug use.   ALLERGIES:  No known drug allergies.  No latex allergies.   MEDICATIONS:  She takes Wellbutrin 150 mg twice a day and Crestor daily.   PHYSICAL EXAMINATION:  VITAL SIGNS:  Weight 131 pounds, height 5 feet 0  inches, blood pressure 118/82, pulse 78.  HEENT:  Normal.  BREASTS:  Normal for the patient with implants.  There is no nipple  discharge, skin changes, or  masses.  ABDOMEN:  Benign.  No hepatosplenomegaly.  GENITOURINARY:  External genitalia, no lesions.  Cuff well healed.  There is good support of the vaginal cuff.  No cystocele or rectocele,  only minimal atrophy with Valsalva.  She does not expel urine, although  she did just void in the bathroom.  Bimanual exam, there are no masses  palpable.   ASSESSMENT AND PLAN:  1. Annual exam.  Recommended that she get her mammograms as      prescribed, recommended self-breast and self-vulvar exams monthly.  2. With regard to her urine loss, I am checking a urinalysis, a urine      culture, and a CBG (78).  I am empirically giving her a course of      Bactrim DS 1 p.o. b.i.d. for 5 days.  I will go ahead and schedule      her an appointment with a urologist.  I have told her that if the  antibiotic clears up her incontinence issue then she should cancel      that appointment.  She will otherwise will come back in a year.      Allie Bossier, MD     MCD/MEDQ  D:  08/17/2009  T:  08/18/2009  Job:  [id number]

## 2011-03-14 NOTE — Assessment & Plan Note (Signed)
NAMEDORALYN, [name] NO.:  [id number]   MEDICAL RECORD NO.:  [id number]          PATIENT TYPE:  POB   LOCATION:  [facility]         FACILITY:  [facility]   PHYSICIAN:  Argentina Donovan, MD        DATE OF BIRTH:  [birth year]   DATE OF SERVICE:  09/14/2008                                  CLINIC NOTE   The patient is a 45 year old Caucasian female who underwent total  abdominal hysterectomy, bilateral salpingo-oophorectomy split up into  two surgeries for endometriosis.  She has also had an appendectomy at  that time.  Following that, was placed on estrogen replacement therapy,  tried several different types, but she had terrible mood swings and  anger when she was on this and had to stop.  She is a small woman,  weighing 115 pounds and 5 feet tall, light complected, and I think high  risk for osteoporosis.  She has not been taking calcium, so we have  counseled her to start on calcium with vitamin D at 1200 mg a day, and  we will follow that with a baseline bone density scan.  In addition to  this, the patient had a small nodule in the right breast that she had it  ultrasound and mammogram on in the last April, which they thought might  be little fat necrosis above her implant.  She is scheduled to follow  that up in early December and for the mammogram we will try and follow  her on the same day for the bone density scan.  The patient also states  that following surgery, where she has had a sling put in for stress  incontinence, she was much improved after the surgery, but recently when  she coughs or sneezes or jumps, she does tend to lose a little urine.  This is becoming a problem for her.   On examination, I did not see much in the way of notable descensus of  the bladder.  However, the fact that she has not been on estrogen for  over a year may have added to the onset of this as a new problem.  I  have told her we will put her on Vagi-Tabs and see if that  works,  although if it starts causing her to have these symptoms that she was  having, has to stop it, and I suggest that she sees a urologist.  Constipation has also been a problem since her surgery, and I have  encouraged to increase fluid and stool softeners.  She states she does  go and she eats prunes and will continue those, and if that does not  help, we will consider a Gastroenterology referral.   PHYSICAL EXAMINATION:  BREASTS:  The patient's breasts are symmetrical  with implants.  No sign of axial or supraclavicular lymphadenopathy.  No  nodules noted by me and no nipple discharge.  ABDOMEN:  Soft, flat, nontender.  No masses, no organomegaly.  EXTERNAL GENITALIA:  Normal.  The vagina is clean with some loss of  rugation, I think.  When I had the patient cough, sneeze, or she pushed  down, she  did not seem to show a significant descensus in the anterior  vaginal wall or the urethra.  The vagina was clean, as I said, with a  loss of rugation and the cul-de-sac with status hysterectomy.  Manual  examination was confirmatory, but failed to reveal any pelvic growths at  all.   IMPRESSION:  1. Mild recurrent urinary stress incontinence.  2. Atrophic vaginitis, early.  3. Concern for osteoporosis, since she will not take any estrogen, has      not taken estrogen, and is of high risk for osteoporosis by      habitus.           ______________________________  Argentina Donovan, MD     PR/MEDQ  D:  09/14/2008  T:  09/15/2008  Job:  [id number]

## 2011-03-14 NOTE — Assessment & Plan Note (Signed)
[name], [name]                  ACCOUNT NO.:  [id number]   MEDICAL RECORD NO.:  [id number]          PATIENT TYPE:  POB   LOCATION:  [facility]         FACILITY:  [facility]   PHYSICIAN:  Tinnie Gens, MD        DATE OF BIRTH:  May 23, 1966   DATE OF SERVICE:  02/11/2008                                  CLINIC NOTE   CHIEF COMPLAINT:  Breast mass.   PRESENT ILLNESS:  The patient is a 45 year old patient of Dr. Mart Piggs  who has undergone a complete hysterectomy and oophorectomy and has been  on estrogen for that period.  The patient has started on NGBL 1.25 mg  daily but continues to have hot flashes on occasion.  The patient is  also reported increasing painful intercourse and renewal of her  endometriosis symptoms.  She is going to review this with Dr. Mia Creek  when she comes in November.  Today the patient comes in complaining of  breast mass that she noticed first last month and seems to be bigger  this month.  It is right behind her nipple.  The patient is status post  implants and she had a negative mammogram in December but she has very  dense breasts as well as implants.   PHYSICAL EXAMINATION:  VITALS:  Her vitals are as in the chart.  GENERAL:  She well-nourished female in no acute distress.  BREASTS:  Breasts are symmetric with everted nipples.  There is no skin  changes noted.  Breast implants present bilaterally.  She does have  fibrocystic change anterior portions of the breast bilaterally. There is  a breast lump that is soft and rounded at 12 o'clock behind her right  nipple.  There is associated fibrocystic change noted in this area and I  am suspicious this could be a breast cyst.   IMPRESSION:  Breast mass.   PLAN:  Will refer to the breast center.  They can do ultrasound as well  as mammogram and biopsy as needed.           ______________________________  Tinnie Gens, MD     TP/MEDQ  D:  02/11/2008  T:  02/11/2008  Job:  [id number]

## 2011-03-17 NOTE — Discharge Summary (Signed)
NAMEYISEL, MEGILL                  ACCOUNT NO.:  [id number]   MEDICAL RECORD NO.:  [id number]          PATIENT TYPE:  OIB   LOCATION:  9311                          FACILITY:  [facility]   PHYSICIAN:  Tracy L. Mayford Knife, M.D.DATE OF BIRTH:  [birth year]   DATE OF ADMISSION:  04/12/2006  DATE OF DISCHARGE:  04/13/2006                                 DISCHARGE SUMMARY   REASON FOR ADMISSION:  Scheduled surgery.   DISCHARGE DIAGNOSES:  1.  Stress incontinence, status post TVT and cystoscopy.   HOSPITAL COURSE:  The patient is a 45 year old female with stress  incontinence. The patient was admitted for scheduled surgery. She underwent  TVT and cystoscopy. Please see OP note for full details. Postoperative  course was unremarkable. Foley was discontinued on postoperative day 1 and  the patient voided without difficulty.   DISPOSITION:  Home.   CONDITION ON DISCHARGE:  Stable condition.   FOLLOWUP:  In 2 weeks with Dr Mia Creek.   ACTIVITY:  No heavy lifting and nothing per vagina x6 weeks.   SPECIAL INSTRUCTIONS:  The patient was also encouraged not to let her  bladder be over-distended. She needs to wake overnight and void.   DISCHARGE MEDICATIONS:  1.  Levaquin 500 mg 1 tab p.o. daily x7 days.  2.  Vicodin p.r.n. pain.           ______________________________  Marc Morgans. Mayford Knife, M.D.     TLW/MEDQ  D:  04/13/2006  T:  04/13/2006  Job:  [id number]

## 2011-03-17 NOTE — Op Note (Signed)
[name], SCHLEMMER                  ACCOUNT NO.:  [id number]   MEDICAL RECORD NO.:  [id number]          PATIENT TYPE:  AMB   LOCATION:  [facility]                           FACILITY:  [facility]   PHYSICIAN:  Ginger Carne, MD  DATE OF BIRTH:  13-Oct-1966   DATE OF PROCEDURE:  03/03/2005  DATE OF DISCHARGE:                                 OPERATIVE REPORT   PREOPERATIVE DIAGNOSIS:  Chronic left lower quadrant pain and dyspareunia.   POSTOPERATIVE DIAGNOSIS:  Chronic left lower quadrant pain and dyspareunia.  Left ovarian/adnexal adhesive disease.   PROCEDURE:  Laparoscopic left salpingo-oophorectomy.   SURGEON:  Ginger Carne, M.D.   ASSISTANT:  None.   COMPLICATIONS:  None immediate.   ESTIMATED BLOOD LOSS:  Minimal.   ANESTHESIA:  General.   SPECIMENS:  Left tube and ovary.   FINDINGS:  External genitalia, vulva, and vagina normal.  Cervix and uterus  and right tube and ovary are absent from prior surgery.  Laparoscopic  evaluation revealed evidence of adhesive disease of the left adnexa to its  respective side wall.  The upper abdomen was normal.  Appendix was normal.  Large and small bowel grossly normal and no evidence for residual  endometriosis in the cul-de-sac, vaginal cuff, or bladder flap.   DESCRIPTION OF PROCEDURE:  The patient was prepped and draped in the usual  fashion and placed in the lithotomy position.  Betadine solution used for  antiseptic and the patient was catheterized prior to the procedure.  After  adequate general anesthesia, a vertical infraumbilical incision was made and  the Veress needle placed in the abdomen.  Opening and closing pressures were  10 to 15 mmHg.  Needle released, trocar placed in the same incision, and  laparoscope placed in the trocar sleeve.  Two 5 mm ports were made in the  left lower quadrant, left hypogastric regions and inspection of the pelvic  and abdominal contents was carried out followed by photography.  Afterward,  adnexal adhesions on the left side were taken down with sharp and blunt  dissection and the infundibulopelvic ligament was bipolar cauterized and cut  including the attachment of the round ligament to the left adnexal  structure.  Specimen removed with an Endopouch without difficulty.  Bleeding  points were hemostatically checked, no active bleeding in the left  infundibulopelvic ligament and pelvic side wall noted.  Gas released,  trocars removed, closure of  the 10 mm fascia site with 0 Vicryl suture and 4-0 Vicryl for the  subcuticular closure.  Needle, sponge, and instrument counts correct.  The  patient tolerated the procedure well and returned to the post anesthesia  recovery room in excellent condition.      SHB/MEDQ  D:  03/03/2005  T:  03/03/2005  Job:  [id number]

## 2011-03-17 NOTE — Op Note (Signed)
[name], [name]                  ACCOUNT NO.:  [id number]   MEDICAL RECORD NO.:  [id number]          PATIENT TYPE:  OIB   LOCATION:  9399                          FACILITY:  [facility]   PHYSICIAN:  Ginger Carne, MD  DATE OF BIRTH:  01-13-1966   DATE OF PROCEDURE:  04/12/2006  DATE OF DISCHARGE:                                 OPERATIVE REPORT   PREOPERATIVE DIAGNOSIS:  Genuine urinary stress incontinence.   POSTOP DIAGNOSIS:  Genuine urinary stress incontinence.   PROCEDURE:  Tension-free vaginal tape procedure and cystoscopy   SURGEON:  Ginger Carne, M.D.   ASSISTANT:  None.   COMPLICATIONS:  None immediate.   ESTIMATED BLOOD LOSS:  Minimal.   SPECIMEN:  None.   OPERATIVE FINDINGS:  The patient had been previously diagnosed with genuine  urinary stress incontinence and appropriately worked up.  No evidence of any  additional findings including prolapse.   OPERATIVE PROCEDURE:  The patient prepped and draped in the usual fashion  and placed in lithotomy position.  Betadine solution used for antiseptic and  the patient was catheterized prior to the procedure.  After adequate general  anesthesia, a weighted speculum was placed in the fourchette and posterior  vaginal wall. The self-retaining retractors utilized.  Following this the  anterior vaginal epithelium was incised in the midline.  The pubovesical  cervical fascia was then dissected bilaterally up to the space of Retzius.  Using a bottom up technique with the Advantage TVT system, the tape was then  placed on either side with the trocar 1-2 cm lateral to the symphysis pubis  on either side.  Following this a cystoscopy performed.  No injury to the  urethra or bladder noted.  Specifically, trigone, side walls, and dome were  identified and free of injury.  Afterwards fluid from the bladder was  removed.  Bladder was then filled to 260 mL and appropriate tensioning of  said tape performed. Following this, the  sheaths were removed and the tape  was well-tensioned.  3-0 Vicryl sutures were placed laterally on either side  to affixed tape to the superficial portion of the bladder muscularis to  avoid posterior migration.  Copious irrigation with lactated Ringer's  followed. Closure of the vaginal epithelium with 3-0 Monocryl running  interlocking suture.  The patient tolerated the procedure well and returned  to the post anesthesia recovery room in excellent condition.     Ginger Carne, MD  Electronically Signed    SHB/MEDQ  D:  04/12/2006  T:  04/12/2006  Job:  [id number]

## 2011-03-17 NOTE — H&P (Signed)
[name], [name]                  ACCOUNT NO.:  [id number]   MEDICAL RECORD NO.:  [id number]          PATIENT TYPE:  AMB   LOCATION:  [facility]                           FACILITY:  [facility]   PHYSICIAN:  Ginger Carne, MD  DATE OF BIRTH:  [birth year]   DATE OF ADMISSION:  04/12/2006  DATE OF DISCHARGE:                                HISTORY & PHYSICAL   REASON FOR HOSPITALIZATION:  Genuine urinary stress incontinence.   HISTORY OF PRESENT ILLNESS:  This patient is a 45 year old gravida 3, para 2-  0-1-2 Caucasian female with a 1-year history of worsening urinary stress  incontinence.  The patient loses urine primarily with coughing, straining  and other Valsalva numerous  The patient denies loss of urine at rest,  nocturia or postvoid dribbling and she denies urgency.  The patient takes no  medications to enhance propensity for losing urine and has no chronic or  debilitating diseases affecting said incontinence.  The patient has never  had previous kidney or bladder surgery.  She has attempted on her own Kegel  exercises without benefit.   OBSTETRICAL/GYNECOLOGICAL HISTORY:  In 2000, the patient underwent a  laparoscopic-assisted vaginal hysterectomy and right salpingo-oophorectomy  because of endometriosis.  In May of 2006, she had a laparoscopic left  salpingo-oophorectomy because of continued pain and endometriosis.  In  October 2060, she had a laparoscopic appendectomy for subacute appendicitis.   The patient has had 2 full-term vaginal deliveries and 1 miscarriage.   ALLERGIES:  None.   CURRENT MEDICATIONS:  Delestrogen injections 40 mg every 3 weeks  intramuscularly and Januvia 1.25 mg twice a day orally.   MEDICAL HISTORY:  Noncontributory.   SURGICAL HISTORY:  Per above.   SOCIAL HISTORY:  The patient smokes less than 1 pack of cigarettes a day,  denies alcohol or illicit drug abuse.   REVIEW OF SYSTEMS:  Ten-point comprehensive review of systems is negative.   FAMILY  HISTORY:  Mother had ovarian cancer at age 15.  She also had a  myocardial infarction and type 2 diabetes.   PHYSICAL EXAMINATION:  VITAL SIGNS:  Blood pressure 123/71.  Height 5 feet 0  inches, weight 124 pounds.  Pulse 59 and regular.  HEENT: Grossly normal.  BREASTS:  Without masses, discharge, thickenings or tenderness.  CHEST:  Clear to percussion and auscultation.  CARDIOVASCULAR:  Without murmurs or enlargements.  Regular rate and rhythm.  EXTREMITIES:  Within normal limits.  LYMPHATICS:  Within normal limits.  SKIN:  Within normal limits.  NEUROLOGICAL:  Within normal limits.  MUSCULOSKELETAL:  Within normal limits.  VASCULAR:  Within normal limits.  ABDOMEN:  Soft without gross hepatosplenomegaly.  PELVIC:  External genitalia, vulva and vagina normal.  Cervix and uterus  absent.  No vault prolapse observed.  GU:  The patient demonstrates loss of urine on Valsalva maneuvers.  Residual  urine volume is 19 mL.  Filling cystometry reveals no evidence for  spontaneous detrusor contractions.  RECTAL:  Hemoccult-negative without masses.   IMPRESSION:  Genuine urinary stress incontinence.   PLAN:  After a thorough discussion  with said patient, she has opted and  agreed to a tension-free vaginal tape procedure and cystoscopy.  Ashby Dawes of  said procedure discussed in detail.  Risks including possible injuries to  ureter, bowel and bladder, possible conversion to an open procedure, graft  rejection, erosion or infection, possible postoperative recurrent urinary  stress incontinence and/or urgency were discussed and understood by said  patient.      Ginger Carne, MD  Electronically Signed     SHB/MEDQ  D:  04/10/2006  T:  04/10/2006  Job:  [id number]

## 2011-03-17 NOTE — Op Note (Signed)
[name], [name]                  ACCOUNT NO.:  [id number]   MEDICAL RECORD NO.:  [id number]          PATIENT TYPE:  AMB   LOCATION:  [facility]                           FACILITY:  [facility]   PHYSICIAN:  Ginger Carne, MD  DATE OF BIRTH:  17-Nov-1965   DATE OF PROCEDURE:  08/11/2005  DATE OF DISCHARGE:                                 OPERATIVE REPORT   PREOPERATIVE DIAGNOSES:  1.  Chronic appendicitis.  2.  Right lower quadrant pain.   POSTOPERATIVE DIAGNOSES:  1.  Chronic appendicitis.  2.  Right lower quadrant pain.   OPERATION/PROCEDURE:  Laparoscopic appendectomy.   SURGEON:  Ginger Carne, M.D.   ASSISTANT:  None.   COMPLICATIONS:  None.   ESTIMATED BLOOD LOSS:  Minimal.   SPECIMENS:  Appendix.   ANESTHESIA:  General.   OPERATIVE FINDINGS:  Upon laparoscopic evaluation, the patient had a  previously excised uterus, cervix, right tube and left tube.  The appendix  was significantly thickened with a thickened mesoappendix.  The patient  appendix was not pliable with a thickened wall.  It was not erythematous.  It was also demonstrating adhesions with omentum to the right gutter.  Large  and small bowel otherwise grossly normal.   DESCRIPTION OF PROCEDURE:  The patient was prepped and draped in the usual  fashion and placed in the lithotomy position.  Betadine solution used for  antiseptic and the patient was catheterized prior to the procedure.  Afterwards a vertical infraumbilical incision was made.  The Veress needle  was placed in the abdomen.  Opening and closing pressures were 10-15 mmHg.  The needle was released, trocar placed in the same incision.  Laparoscope  placed in the trocar sleeve.  Two 5 mm ports were made in the left lower  quadrant and left hypogastric region.  Following this, the appendix was  visualized and mesoappendix was bipolar cauterized at the base.  Two 0  Vicryl loop ties were placed at the base of the appendix and one about the 8  mm above  the first two.  Appendix cut above the first two ties, removed with  an Endopouch.  Copiously irrigation with lactated Ringer's to the base  solid. No  active bleeding at the base noted.  Irrigant removed.  Gas released.  Trocars removed.  Closure with a 10 mm fascial site was 0 Vicryl sutured and  4-0 Vicryl for subcuticular closure.  Instrument and sponge count were  correct.  The patient tolerated the procedure well and returned to the post  anesthesia recovery room in excellent condition.      Ginger Carne, MD  Electronically Signed     SHB/MEDQ  D:  08/11/2005  T:  08/11/2005  Job:  [id number]

## 2011-03-17 NOTE — H&P (Signed)
[name], [name]                  ACCOUNT NO.:  [id number]   MEDICAL RECORD NO.:  [id number]          PATIENT TYPE:  AMB   LOCATION:  [facility]                           FACILITY:  [facility]   PHYSICIAN:  Ginger Carne, MD  DATE OF BIRTH:  15-Feb-1966   DATE OF ADMISSION:  DATE OF DISCHARGE:                                HISTORY & PHYSICAL   REASON FOR HOSPITALIZATION:  Chronic left lower quadrant pain and  dyspareunia.   HISTORY OF PRESENT ILLNESS:  This patient is a 45 year old gravida 3, para 2-  0-1-2, Caucasian female admitted for a laparoscopic left salpingo-  oophorectomy.  The patient has had a longstanding history for over 1 year of  worsening dyspareunia principally on the left lower quadrant.  The patient  also complains of discomfort when she does not engage in intercourse.  In  2000, the patient had a total vaginal hysterectomy and right salpingo-  oophorectomy with preservation of her left adnexa because of  menometrorrhagia and endometriosis (stage II).  The patient was not  suppressed with oral contraceptives due to active smoking history and age  over 30.   The patient denies genitourinary, gastrointestinal or musculoskeletal  sources for her discomfort.   OBSTETRICAL/GYNECOLOGICAL HISTORY:  She has had 2 full-term pregnancies, in  1985 and 1992, by way of vaginal delivery.  She had a miscarriage in 1987  and bilateral tubal ligation in 1994.   PAST SURGICAL HISTORY:  Total vaginal hysterectomy and right salpingo-  oophorectomy in October of 2000.   SOCIAL HISTORY:  This patient smokes 1 pack of cigarettes per day, denies  alcohol or illicit drug abuse.   MEDICAL HISTORY:  Hypercholesterolemia.   CURRENT MEDICATIONS:  1.  Crestor 10 mg one at night daily.  2.  Sudafed p.r.n. for allergies.   ALLERGIES:  None to medication, iodine or Latex.   REVIEW OF SYSTEMS:  Negative.   FAMILY HISTORY:  Mother, at the age of 38, has had ovarian carcinoma in  addition to  myocardial infarction and type 2 diabetes.  Her father is in  good health.   PHYSICAL EXAMINATION:  VITAL SIGNS:  Blood pressure 110/70.  Height 5 foot 0  inches.  Weight 119 pounds.  HEENT:  Grossly normal.  BREASTS:  Breast exam without masses, discharge, thickenings or tenderness.  CHEST:  Clear to percussion and auscultation.  CARDIOVASCULAR:  Exam without murmurs or enlargements, regular rate and  rhythm.  EXTREMITIES/LYMPHATICS/NEUROLOGICAL/MUSCULOSKELETAL:  Within normal limits.  ABDOMEN:  Soft without gross hepatosplenomegaly.  PELVIC:  External genitalia, vulva and vagina normal.  Cervix and uterus  surgically absent.  Right adnexa palpable without masses.  Left adnexa  reveals tenderness, but without enlarged masses.  RECTAL:  Hemoccult-negative, without masses.   IMAGING STUDIES:  Transvaginal ultrasound reveals ovary measuring 3.7 x 2.1  x 1.7 cm abutting the vaginal cuff.   IMPRESSION:  1.  Dyspareunia and chronic left lower quadrant pain, status post vaginal      hysterectomy.  2.  Probable left peri-adnexal adhesions.   PLAN:  The patient does not desire  to continue having said discomfort.  She  was offered the option of adhesiolysis of the left adnexa as opposed to a  laparoscopic left salpingo-oophorectomy.  The pros and cons of the above  procedures were discussed in detail.  The patient understood that if  adhesiolysis was performed, new adhesions may re-form.  In addition, the  patient may have endometriosis of the left adnexa as well which would not be  managed appropriately with adhesiolysis.  She has opted for removal of said  tube and ovary.  The patient understands that she will be a candidate for  estrogen replacement therapy after  said procedure in addition to the pros  and cons of estrogen replacement therapy.  Risks including possible  laparotomy, bleeding and infection discussed in detail.  The patient also  understands that there may be a period of  time to adjust her estrogen dosing  for satisfactory results.      SHB/MEDQ  D:  03/02/2005  T:  03/02/2005  Job:  [id number]

## 2011-03-17 NOTE — H&P (Signed)
[name], [name]                  ACCOUNT NO.:  [id number]   MEDICAL RECORD NO.:  [id number]           PATIENT TYPE:   LOCATION:                                 FACILITY:   PHYSICIAN:  Ginger Carne, MD       DATE OF BIRTH:   DATE OF ADMISSION:  08/11/2005  DATE OF DISCHARGE:                                HISTORY & PHYSICAL   REASON FOR HOSPITALIZATION:  Subacute appendicitis.   HISTORY OF PRESENT ILLNESS:  This patient is a 45 year old gravida 3, para 2-  0-1-2 Caucasian female admitted for a laparoscopic appendectomy because of  chronic subacute appendicitis.  Patient has had a three-week history of  worsening right lower quadrant pain.  She has had a previous left salpingo-  oophorectomy laparoscopically performed in May of 2006 and a hysterectomy  and right salpingo-oophorectomy in 2000 for endometriosis.  The patient  underwent a CAT scan of the abdomen and pelvis without contrast on the 10th  of October which demonstrated no evidence for renal or ureteral stones.  The  appendix did fill with contrast and exuded acute appendicitis.  Patient has  no genitourinary or musculoskeletal sources for her discomfort.  She has  also had some nausea and anorexia associated with symptoms.   OB/GYN HISTORY:  Patient has had two full-term vaginal deliveries and a  miscarriage.  In 2000 she underwent a total vaginal hysterectomy, right  salpingo-oophorectomy for stage II endometriosis, menometrorrhagia, and  pelvic pain.  In May of 2006 she underwent a laparoscopic left salpingo-  oophorectomy because of adhesive disease and dyspareunia.   ALLERGIES:  None.   CURRENT MEDICATIONS:  Premarin 1.25 mg twice daily.   MEDICAL HISTORY:  Noncontributory.   SURGICAL HISTORY:  Per above.   SOCIAL HISTORY:  Patient smokes one pack of cigarettes daily.  Denies  alcohol or illicit drug abuse.   REVIEW OF SYSTEMS:  Negative.   FAMILY HISTORY:  Mother had ovarian cancer at age 32.  Also  history of  myocardial infarction and type 2 diabetes.   PHYSICAL EXAMINATION:  VITAL SIGNS:  Weight 119 pounds, blood pressure  100/60, height 5 feet 0 inches.  HEENT:  Grossly normal.  CHEST:  Clear.  CARDIAC:  Without murmurs or enlargements.  Regular rate and rhythm.  EXTREMITIES:  Within normal limits.  LYMPHATICS:  Within normal limits.  SKIN:  Within normal limits.  NEUROLOGIC:  Within normal limits.  MUSCULOSKELETAL:  Within normal limits.  ABDOMEN:  Soft without gross hepatosplenomegaly.  There was tenderness in  the right lower quadrant.  PELVIC:  External genitalia, vulva, and vagina normal.  Cervix and uterus  absent.  Both adnexa surgically absent, vaginal cuff palpable with minimal  tenderness.  RECTAL:  Hemoccult-negative without masses.   LABORATORIES:  Urinalysis is normal.   IMPRESSION:  Subacute appendicitis.   PLAN:  Patient will undergo laparoscopic appendectomy. Normal CT scan with  contrast of the appendix does not exclude subacute appendicitis.  Ashby Dawes of  said procedure discussed in detail.  Her symptoms have worsened and no other  reasonable  source for her pain can be identified.      Ginger Carne, MD  Electronically Signed     SHB/MEDQ  D:  08/10/2005  T:  08/10/2005  Job:  [id number]

## 2011-05-30 ENCOUNTER — Other Ambulatory Visit: Payer: Self-pay | Admitting: Family Medicine

## 2011-05-30 DIAGNOSIS — R091 Pleurisy: Secondary | ICD-10-CM

## 2011-06-01 ENCOUNTER — Ambulatory Visit
Admission: RE | Admit: 2011-06-01 | Discharge: 2011-06-01 | Disposition: A | Discharge status: Home / Self Care | Payer: 59 | Source: Ambulatory Visit | Attending: Family Medicine | Admitting: Family Medicine

## 2011-06-01 DIAGNOSIS — R091 Pleurisy: Secondary | ICD-10-CM

## 2011-06-05 ENCOUNTER — Other Ambulatory Visit: Payer: Self-pay | Admitting: Family Medicine

## 2011-06-05 DIAGNOSIS — K769 Liver disease, unspecified: Secondary | ICD-10-CM

## 2011-06-08 ENCOUNTER — Ambulatory Visit
Admission: RE | Admit: 2011-06-08 | Discharge: 2011-06-08 | Disposition: A | Discharge status: Home / Self Care | Payer: 59 | Source: Ambulatory Visit | Attending: Family Medicine | Admitting: Family Medicine

## 2011-06-08 DIAGNOSIS — K769 Liver disease, unspecified: Secondary | ICD-10-CM

## 2011-10-11 ENCOUNTER — Encounter: Payer: Self-pay | Admitting: Obstetrics & Gynecology

## 2011-10-11 ENCOUNTER — Ambulatory Visit (INDEPENDENT_AMBULATORY_CARE_PROVIDER_SITE_OTHER): Payer: 59 | Admitting: Obstetrics & Gynecology

## 2011-10-11 VITALS — BP 119/78 | HR 65 | Ht 60.0 in | Wt 153.0 lb

## 2011-10-11 DIAGNOSIS — Z01419 Encounter for gynecological examination (general) (routine) without abnormal findings: Secondary | ICD-10-CM

## 2011-10-11 DIAGNOSIS — N951 Menopausal and female climacteric states: Secondary | ICD-10-CM

## 2011-10-11 DIAGNOSIS — Z0000 Encounter for general adult medical examination without abnormal findings: Secondary | ICD-10-CM

## 2011-10-11 MED ORDER — VENLAFAXINE HCL 75 MG PO TABS: 75.0000 mg | ORAL_TABLET | Freq: Two times a day (BID) | ORAL | Status: DC

## 2011-10-11 MED ORDER — ESTRADIOL 1 MG PO TABS: 1.0000 mg | ORAL_TABLET | Freq: Every day | ORAL | Status: DC

## 2011-10-11 NOTE — Progress Notes (Signed)
  Subjective:    [name] is a 45 y.o. G2P2 s/p hysterectomy for benign indications who presents for annual exam and to discuss hormone replacement therapy for treatment of very symptomatic menopausal symptoms Patient is requesting hormone replacement therapy due to hot flashes, insomnia, moodiness, family history of heart disease and hysterectomy with BSO. The patient is not taking hormone replacement therapy. Patient denies post-menopausal vaginal bleeding. The patient is sexually active. GYN screening history: last mammogram: approximate date 01/2011 and was normal. Patient is hormone deficient due to hysterectomy with BSO, which occurred several years ago. The patient currently has symptoms of anxiety, hot flashes, insomnia, moodiness, vaginal dryness.   Patient is also requesting lipid panel check, she came in fasting.  Menstrual History: OB History    Grav Para Term Preterm Abortions TAB SAB Ect Mult Living   2 2        2      No LMP recorded. Patient has had a hysterectomy.   The following portions of the patient's history were reviewed and updated as appropriate: allergies, current medications, past family history, past medical history, past social history, past surgical history and problem list.  Review of Systems A comprehensive review of systems was negative.    Objective:     BP 119/78  Pulse 65  Ht 5' (1.524 m)  Wt 153 lb (69.4 kg)  BMI 29.88 kg/m2 GENERAL: Well-developed, well-nourished female in no acute distress.  HEENT: Normocephalic, atraumatic. Sclerae anicteric.  NECK: Supple. Normal thyroid.  LUNGS: Clear to auscultation bilaterally.  HEART: Regular rate and rhythm. BREASTS: Symmetric with everted nipples. No masses, skin changes, nipple drainage, or lymphadenopathy. ABDOMEN: Soft, nontender, nondistended. No organomegaly. PELVIC: Normal external female genitalia. Vagina is pink and ruggated, well healed vaginal cuff.  Normal discharge.  EXTREMITIES: No  cyanosis, clubbing, or edema, 2+ distal pulses.     Assessment:   Normal annual exam Menopausal symptoms  Discussion of hormone replacement therapy in 45 y.o. woman.    Plan:   Counseled regarding lifestyle changes, other non-hormonal treatments including Effexor.  Patient requests HRT and Effexor. Risks and benefits of HRT, including recent evidence on HRT effects on breast cancer, stroke and heart disease, were discussed. Will begin patient on Estrogen: Estradiol 1mg  daily and Effexor 75 mg daily, both eprescribed. Lipid panel checked, will follow up results. Follow up in 3 months.

## 2011-10-11 NOTE — Patient Instructions (Signed)
Hormone Therapy At menopause, your body begins making less estrogen and progesterone hormones. This causes the body to stop having menstrual periods. This is because estrogen and progesterone hormones control your periods and menstrual cycle. A lack of estrogen may cause symptoms such as:  Hot flushes (or hot flashes).   Vaginal dryness.   Dry skin.   Loss of sex drive.   Risk of bone loss (osteoporosis).  When this happens, you may choose to take hormone therapy to get back the estrogen lost during menopause. When the hormone estrogen is given alone, it is usually referred to as ET (Estrogen Therapy). When the hormone progestin is combined with estrogen, it is generally called HT (Hormone Therapy). This was formerly known as hormone replacement therapy (HRT). Your caregiver can help you make a decision on what will be best for you. The decision to use HT seems to change often as new studies are done. Many studies do not agree on the benefits of hormone replacement therapy. LIKELY BENEFITS OF HT INCLUDE PROTECTION FROM:  Hot Flushes (also called hot flashes) - A hot flush is a sudden feeling of heat that spreads over the face and body. The skin may redden like a blush. It is connected with sweats and sleep disturbance. Women going through menopause may have hot flushes a few times a month or several times per day depending on the woman.   Osteoporosis (bone loss)- Estrogen helps guard against bone loss. After menopause, a woman's bones slowly lose calcium and become weak and brittle. As a result, bones are more likely to break. The hip, wrist, and spine are affected most often. Hormone therapy can help slow bone loss after menopause. Weight bearing exercise and taking calcium with vitamin D also can help prevent bone loss. There are also medications that your caregiver can prescribe that can help prevent osteoporosis.   Vaginal Dryness - Loss of estrogen causes changes in the vagina. Its lining  may become thin and dry. These changes can cause pain and bleeding during sexual intercourse. Dryness can also lead to infections. This can cause burning and itching. (Vaginal estrogen treatment can help relieve pain, itching, and dryness.)   Urinary Tract Infections are more common after menopause because of lack of estrogen. Some women also develop urinary incontinence because of low estrogen levels in the vagina and bladder.   Possible other benefits of estrogen include a positive effect on mood and short-term memory in women.  RISKS AND COMPLICATIONS  Using estrogen alone without progesterone causes the lining of the uterus to grow. This increases the risk of lining of the uterus (endometrial) cancer. Your caregiver should give another hormone called progestin if you have a uterus.   Women who take combined (estrogen and progestin) HT appear to have an increased risk of breast cancer. The risk appears to be small, but increases throughout the time that HT is taken.   Combined therapy also makes the breast tissue slightly denser which makes it harder to read mammograms (breast X-rays).   Combined, estrogen and progesterone therapy can be taken together every day, in which case there may be spotting of blood. HT therapy can be taken cyclically in which case you will have menstrual periods. Cyclically means HT is taken for a set amount of days, then not taken, then this process is repeated.   HT may increase the risk of stroke, heart attack, breast cancer and forming blood clots in your leg.   Transdermal estrogen (estrogen that is absorbed  through the skin with a patch or a cream) may have more positive results with:   Cholesterol.   Blood pressure.   Blood clots.  Having the following conditions may indicate you should not have HT:  Endometrial cancer.   Liver disease.   Breast cancer.   Heart disease.   History of blood clots.   Stroke.  TREATMENT   If you choose to take HT  and have a uterus, usually estrogen and progestin are prescribed.   Your caregiver will help you decide the best way to take the medications.   Possible ways to take estrogen include:   Pills.   Patches.   Gels.   Sprays.   Vaginal estrogen cream, rings and tablets.   It is best to take the lowest dose possible that will help your symptoms and take them for the shortest period of time that you can.   Hormone therapy can help relieve some of the problems (symptoms) that affect women at menopause. Before making a decision about HT, talk to your caregiver about what is best for you. Be well informed and comfortable with your decisions.  HOME CARE INSTRUCTIONS   Follow your caregivers advice when taking the medications.   A Pap test is done to screen for cervical cancer.   The first Pap test should be done at age 45.   Between ages 26 and 66, Pap tests are repeated every 2 years.   Beginning at age 95, you are advised to have a Pap test every 3 years as long as your past 3 Pap tests have been normal.   Some women have medical problems that increase the chance of getting cervical cancer. Talk to your caregiver about these problems. It is especially important to talk to your caregiver if a new problem develops soon after your last Pap test. In these cases, your caregiver may recommend more frequent screening and Pap tests.   The above recommendations are the same for women who have or have not gotten the vaccine for HPV (Human Papillomavirus).   If you had a hysterectomy for a problem that was not a cancer or a condition that could lead to cancer, then you no longer need Pap tests. However, even if you no longer need a Pap test, a regular exam is a good idea to make sure no other problems are starting.    If you are between ages 36 and 55, and you have had normal Pap tests going back 10 years, you no longer need Pap tests. However, even if you no longer need a Pap test, a regular  exam is a good idea to make sure no other problems are starting.    If you have had past treatment for cervical cancer or a condition that could lead to cancer, you need Pap tests and screening for cancer for at least 20 years after your treatment.   If Pap tests have been discontinued, risk factors (such as a new sexual partner) need to be re-assessed to determine if screening should be resumed.   Some women may need screenings more often if they are at high risk for cervical cancer.   Get mammograms done as per the advice of your caregiver.  SEEK IMMEDIATE MEDICAL CARE IF:  You develop abnormal vaginal bleeding.   You have pain or swelling in your legs, shortness of breath, or chest pain.   You develop dizziness or headaches.   You have lumps or changes in your breasts or  armpits.   You have slurred speech.   You develop weakness or numbness of your arms or legs.   You have pain, burning, or bleeding when urinating.   You develop abdominal pain.  Document Released: 07/15/2003 Document Revised: 06/28/2011 Document Reviewed: 11/02/2010 [facility] Patient Information 2012 [place], [place].  Preventative Care for Adults, Female A healthy lifestyle and preventative care can promote health and wellness. Preventative health guidelines for women include the following key practices:  A routine yearly physical is a good way to check with your caregiver about your health and preventative screening. It is a chance to share any concerns and updates on your health, and to receive a thorough exam.   Visit your dentist for a routine exam and preventative care every 6 months. Brush your teeth twice a day and floss once a day. Good oral hygiene prevents tooth decay and gum disease.   The frequency of eye exams is based on your age, health, family medical history, use of contact lenses, and other factors. Follow your caregiver's recommendations for frequency of eye exams.   Eat a healthy diet.  Foods like vegetables, fruits, whole grains, low-fat dairy products, and lean protein foods contain the nutrients you need without too many calories. Decrease your intake of foods high in solid fats, added sugars, and salt. Eat the right amount of calories for you.Get information about a proper diet from your caregiver, if necessary.   Regular physical exercise is one of the most important things you can do for your health. Most adults should get at least 150 minutes of moderate-intensity exercise (any activity that increases your heart rate and causes you to sweat) each week. In addition, most adults need muscle-strengthening exercises on 2 or more days a week.   Maintain a healthy weight. The body mass index (BMI) is a screening tool to identify possible weight problems. It provides an estimate of body fat based on height and weight. Your caregiver can help determine your BMI, and can help you achieve or maintain a healthy weight.For adults 20 years and older:   A BMI below 18.5 is considered underweight.   A BMI of 18.5 to 24.9 is normal.   A BMI of 25 to 29.9 is considered overweight.   A BMI of 30 and above is considered obese.   Maintain normal blood lipids and cholesterol levels by exercising and minimizing your intake of saturated fat. Eat a balanced diet with plenty of fruit and vegetables. Blood tests for lipids and cholesterol should begin at age 70 and be repeated every 5 years. If your lipid or cholesterol levels are high, you are over 50, or you are a high risk for heart disease, you may need your cholesterol levels checked more frequently.Ongoing high lipid and cholesterol levels should be treated with medicines if diet and exercise are not effective.   If you smoke, find out from your caregiver how to quit. If you do not use tobacco, do not start.   If you are pregnant, do not drink alcohol. If you are breastfeeding, be very cautious about drinking alcohol. If you are not  pregnant and choose to drink alcohol, do not exceed 1 drink per day. One drink is considered to be 12 ounces (355 mL) of beer, 5 ounces (148 mL) of wine, or 1.5 ounces (44 mL) of liquor.   Avoid use of street drugs. Do not share needles with anyone. Ask for help if you need support or instructions about stopping the use of  drugs.   High blood pressure causes heart disease and increases the risk of stroke. Your blood pressure should be checked at least every 1 to 2 years. Ongoing high blood pressure should be treated with medicines if weight loss and exercise are not effective.   If you are 17 to 45 years old, ask your caregiver if you should take aspirin to prevent strokes.   Diabetes screening involves taking a blood sample to check your fasting blood sugar level. This should be done once every 3 years, after age 64, if you are within normal weight and without risk factors for diabetes. Testing should be considered at a younger age or be carried out more frequently if you are overweight and have at least 1 risk factor for diabetes.   Breast cancer screening is essential preventative care for women. You should practice "breast self-awareness." This means understanding the normal appearance and feel of your breasts and may include breast self-examination. Any changes detected, no matter how small, should be reported to a caregiver. Women in their 69s and 30s should have a clinical breast exam (CBE) by a caregiver as part of a regular health exam every 1 to 3 years. After age 49, women should have a CBE every year. Starting at age 73, women should consider having a mammogram (breast X-ray) every year. Women who have a family history of breast cancer should talk to their caregiver about genetic screening. Women at a high risk of breast cancer should talk to their caregiver about having an MRI and a mammogram every year.   The Pap test is a screening test for cervical cancer. A Pap test can show cell changes  on the cervix that might become cervical cancer if left untreated. A Pap test is a procedure in which cells are obtained and examined from the lower end of the uterus (cervix).   Women should have a Pap test starting at age 35.   Between ages 32 and 63, Pap tests should be repeated every 2 years.   Beginning at age 66, you should have a Pap test every 3 years as long as the past 3 Pap tests have been normal.   Some women have medical problems that increase the chance of getting cervical cancer. Talk to your caregiver about these problems. It is especially important to talk to your caregiver if a new problem develops soon after your last Pap test. In these cases, your caregiver may recommend more frequent screening and Pap tests.   The above recommendations are the same for women who have or have not gotten the vaccine for human papillomavirus (HPV).   If you had a hysterectomy for a problem that was not cancer or a condition that could lead to cancer, then you no longer need Pap tests. Even if you no longer need a Pap test, a regular exam is a good idea to make sure no other problems are starting.   If you are between ages 33 and 26, and you have had normal Pap tests going back 10 years, you no longer need Pap tests. Even if you no longer need a Pap test, a regular exam is a good idea to make sure no other problems are starting.   If you have had past treatment for cervical cancer or a condition that could lead to cancer, you need Pap tests and screening for cancer for at least 20 years after your treatment.   If Pap tests have been discontinued, risk factors (such  as a new sexual partner) need to be reassessed to determine if screening should be resumed.   The HPV test is an additional test that may be used for cervical cancer screening. The HPV test looks for the virus that can cause the cell changes on the cervix. The cells collected during the Pap test can be tested for HPV. The HPV test  could be used to screen women aged 48 years and older, and should be used in women of any age who have unclear Pap test results. After the age of 62, women should have HPV testing at the same frequency as a Pap test.   Colorectal cancer can be detected and often prevented. Most routine colorectal cancer screening begins at the age of 55 and continues through age 85. However, your caregiver may recommend screening at an earlier age if you have risk factors for colon cancer. On a yearly basis, your caregiver may provide home test kits to check for hidden blood in the stool. Use of a small camera at the end of a tube, to directly examine the colon (sigmoidoscopy or colonoscopy), can detect the earliest forms of colorectal cancer. Talk to your caregiver about this at age 83, when routine screening begins. Direct examination of the colon should be repeated every 5 to 10 years through age 25, unless early forms of pre-cancerous polyps or small growths are found.   Practice safe sex. Use condoms and avoid high-risk sexual practices to reduce the spread of sexually transmitted infections (STIs). STIs include gonorrhea, chlamydia, syphilis, trichomonas, herpes, HPV, and human immunodeficiency virus (HIV). Herpes, HIV, and HPV are viral illnesses that have no cure. They can result in disability, cancer, and death. Sexually active women aged 60 and younger should be checked for Chlamydia. Older women with new or multiple partners should also be tested for Chlamydia. Testing for other STIs is recommended if you are sexually active and at increased risk.   Osteoporosis is a disease in which the bones lose minerals and strength with aging. This can result in serious bone fractures. The risk of osteoporosis can be identified using a bone density scan. Women ages 69 and over and women at risk for fractures or osteoporosis should discuss screening with their caregivers. Ask your caregiver whether you should take a calcium  supplement or vitamin D to reduce the rate of osteoporosis.   Menopause can be associated with physical symptoms and risks. Hormone replacement therapy is available to decrease symptoms and risks. You should talk to your caregiver about whether hormone replacement therapy is right for you.   Use sunscreen with skin protection factor (SPF) of 30 or more. Apply sunscreen liberally and repeatedly throughout the day. You should seek shade when your shadow is shorter than you. Protect yourself by wearing long sleeves, pants, a wide-brimmed hat, and sunglasses year round, whenever you are outdoors.   Once a month, do a whole body skin exam, using a mirror to look at the skin on your back. Notify your caregiver of new moles, moles that have irregular borders, moles that are larger than a pencil eraser, or moles that have changed in shape or color.   Stay current with required immunizations.   Influenza. You need a dose every fall (or winter). The composition of the flu vaccine changes each year, so being vaccinated once is not enough.   Pneumococcal polysaccharide. You need 1 to 2 doses if you smoke cigarettes or if you have certain chronic medical conditions. You need  1 dose at age 93 (or older) if you have never been vaccinated.   Tetanus, diphtheria, pertussis (Tdap, Td). Get 1 dose of Tdap vaccine if you are younger than age 62 years, are over 58 and have contact with an infant, are a Research scientist (physical sciences), are pregnant, or simply want to be protected from whooping cough. After that, you need a Td booster dose every 10 years. Consult your caregiver if you have not had at least 3 tetanus and diphtheria-containing shots sometime in your life or have a deep or dirty wound.   HPV. You need this vaccine if you are a woman age 38 years or younger. The vaccine is given in 3 doses over 6 months.   Measles, mumps, rubella (MMR). You need at least 1 dose of MMR if you were born in 1957 or later. You may also need a  2nd dose.   Meningococcal. If you are age 68 to 68 years and a Orthoptist living in a residence hall, or have one of several medical conditions, you need to get vaccinated against meningococcal disease. You may also need additional booster doses.   Zoster (shingles). If you are age 60 years or older, you should get this vaccine.   Varicella (chickenpox). If you have never had chickenpox or you were vaccinated but received only 1 dose, talk to your caregiver to find out if you need this vaccine.   Hepatitis A. You need this vaccine if you have a specific risk factor for hepatitis A virus infection or you simply wish to be protected from this disease. The vaccine is usually given as 2 doses, 6 to 18 months apart.   Hepatitis B. You need this vaccine if you have a specific risk factor for hepatitis B virus infection or you simply wish to be protected from this disease. The vaccine is given in 3 doses, usually over 6 months.  Preventative Services / Frequency Ages 68 to 72  Blood pressure check.** / Every 1 to 2 years.   Lipid and cholesterol check.**/ Every 5 years beginning at age 74.   Clinical breast exam.** / Every 3 years for women in their 66s and 30s.   Pap Test.** / Every 2 years from ages 68 through 82. Every 3 years starting at age 71 years through age 30 or 56 with a history of 3 consecutive normal Pap tests.   HPV Screening.** / Every 3 years from ages 36 through ages 36 to 64 with a history of 3 consecutive normal Pap tests.   Skin self-exam. / Monthly.   Influenza immunization.** / Every year.   Pneumococcal polysaccharide immunization.** / 1 to 2 doses if you smoke cigarettes or if you have certain chronic medical conditions.   Tetanus, diphtheria, pertussis (Tdap,Td) immunization. / A one-time dose of Tdap vaccine. After that, you need a Td booster dose every 10 years.   HPV immunization. / 3 doses over 6 months, if 26 and younger.   Measles, mumps,  rubella (MMR) immunization. / You need at least 1 dose of MMR if you were born in 1957 or later. You may also need a 2nd dose.   Meningococcal immunization. / 1 dose if you are age 17 to 75 years and a Orthoptist living in a residence hall, or have one of several medical conditions, you need to get vaccinated against meningococcal disease. You may also need additional booster doses.   Varicella immunization. **/ Consult your caregiver.   Hepatitis A  immunization. ** / Consult your caregiver. 2 doses, 6 to 18 months apart.   Hepatitis B immunization.** / Consult your caregiver. 3 doses usually over 6 months.  Ages 60 to 30  Blood pressure check.** / Every 1 to 2 years.   Lipid and cholesterol check.**/ Every 5 years beginning at age 52.   Clinical breast exam.** / Every year after age 59.   Mammogram.** / Every year beginning at age 32 and continuing for as long as you are in good health. Consult with your caregiver.   Pap Test.** / Every 3 years starting at age 33 years through age 61 or 64 with a history of 3 consecutive normal Pap tests.   HPV Screening.** / Every 3 years from ages 61 through ages 59 to 47 with a history of 3 consecutive normal Pap tests.   Fecal occult blood test (FOBT) of stool. / Every year beginning at age 28 and continuing until age 104. You may not have to do this test if you get colonoscopy every 10 years.   Flexible sigmoidoscopy** or colonoscopy.** / Every 5 years for a flexible sigmoidoscopy or every 10 years for a colonoscopy beginning at age 56 and continuing until age 81.   Skin self-exam. / Monthly.   Influenza immunization.** / Every year.   Pneumococcal polysaccharide immunization.** / 1 to 2 doses if you smoke cigarettes or if you have certain chronic medical conditions.   Tetanus, diphtheria, pertussis (Tdap/Td) immunization.** / A one-time dose of Tdap vaccine. After that, you need a Td booster dose every 10 years.   Measles,  mumps, rubella (MMR) immunization. / You need at least 1 dose of MMR if you were born in 1957 or later. You may also need a 2nd dose.   Varicella immunization. **/ Consult your caregiver.   Meningococcal immunization.** / Consult your caregiver.     Hepatitis A immunization. ** / Consult your caregiver. 2 doses, 6 to 18 months apart.   Hepatitis B immunization.** / Consult your caregiver. 3 doses, usually over 6 months.  Ages 56 and over  Blood pressure check.** / Every 1 to 2 years.   Lipid and cholesterol check.**/ Every 5 years beginning at age 22.   Clinical breast exam.** / Every year after age 37.   Mammogram.** / Every year beginning at age 23 and continuing for as long as you are in good health. Consult with your caregiver.   Pap Test,** / Every 3 years starting at age 57 years through age 42 or 52 with a 3 consecutive normal Pap tests. Testing can be stopped between 65 and 70 with 3 consecutive normal Pap tests and no abnormal Pap or HPV tests in the past 10 years.   HPV Screening.** / Every 3 years from ages 41 through ages 77 or 36 with a history of 3 consecutive normal Pap tests. Testing can be stopped between 65 and 70 with 3 consecutive normal Pap tests and no abnormal Pap or HPV tests in the past 10 years.   Fecal occult blood test (FOBT) of stool. / Every year beginning at age 55 and continuing until age 81. You may not have to do this test if you get colonoscopy every 10 years.   Flexible sigmoidoscopy** or colonoscopy.** / Every 5 years for a flexible sigmoidoscopy or every 10 years for a colonoscopy beginning at age 78 and continuing until age 36.   Osteoporosis screening.** / A one-time screening for women ages 52 and over and women  at risk for fractures or osteoporosis.   Skin self-exam. / Monthly.   Influenza immunization.** / Every year.   Pneumococcal polysaccharide immunization.** / 1 dose at age 15 (or older) if you have never been vaccinated.    Tetanus, diphtheria, pertussis (Tdap, Td) immunization. / A one-time dose of Tdap vaccine if you are over 65 and have contact with an infant, are a Research scientist (physical sciences), or simply want to be protected from whooping cough. After that, you need a Td booster dose every 10 years.   Varicella immunization. **/ Consult your caregiver.   Meningococcal immunization.** / Consult your caregiver.   Hepatitis A immunization. ** / Consult your caregiver. 2 doses, 6 to 18 months apart.   Hepatitis B immunization.** / Check with your caregiver. 3 doses, usually over 6 months.  ** Family history and personal history of risk and conditions may change your caregiver's recommendations. Document Released: 12/12/2001 Document Revised: 06/28/2011 Document Reviewed: 03/13/2011 [facility] Patient Information 2012 [place], [place].  Hand Washing Staying healthy is important to you and your entire family. Follow these easy, low-cost steps to help stop many infectious diseases before they happen. HOW TO [facility]  Wet your hands and apply liquid, bar, or powder soap.   Rub hands together vigorously to make a lather and scrub all surfaces. Be sure to clean between the fingers and around the nails.   Continue for 20 seconds! It takes that long for the soap and scrubbing action to dislodge and remove stubborn germs.   Rinse hands well under running water.   Dry your hands using a paper towel or air dryer.   If possible, use your paper towel or elbow to turn off the faucet. This will help avoid re-exposure to germs on the handle.  WHEN TO [facility] YOUR HANDS  Before and after eating.   Before, during, and after handling or preparing food.   After contact with blood or body fluids (like vomit, nasal secretions, or saliva). This means washing after you blow your nose!   Before and after changing a diaper.   After you use the bathroom.   After handling animals, their toys, leashes, or waste.   After touching something  that could be contaminated (such as a trash can, cleaning cloth, drain, or soil).   Before and after taking care of (dressing) a wound, giving medicine, or inserting contact lenses.   More often when someone in your home is sick.   Whenever your hands become soiled.  If soap and water are not available, use an alcohol-based wipe or hand gel. Keeping your hands clean is one of the best ways to keep from getting sick and spreading illnesses. Cleaning your hands gets rid of germs you pick up:  From other people.   From the surfaces you touch.   From the animals you come in contact with.  Document Released: 06/06/2005 Document Revised: 06/28/2011 Document Reviewed: 11/11/2008 [facility] Patient Information 2012 [place], [place].

## 2011-10-11 NOTE — Progress Notes (Signed)
Addended by: Barbara Cower on: 10/11/2011 11:37 AM   Modules accepted: Orders

## 2011-10-12 LAB — COMPREHENSIVE METABOLIC PANEL
ALT: 26 U/L (ref 0–35)
AST: 31 U/L (ref 0–37)
Albumin: 4.4 g/dL (ref 3.5–5.2)
Alkaline Phosphatase: 84 U/L (ref 39–117)
BUN: 14 mg/dL (ref 6–23)
CO2: 28 mEq/L (ref 19–32)
Calcium: 10 mg/dL (ref 8.4–10.5)
Chloride: 103 mEq/L (ref 96–112)
Creat: 0.66 mg/dL (ref 0.50–1.10)
Glucose, Bld: 88 mg/dL (ref 70–99)
Potassium: 4.5 mEq/L (ref 3.5–5.3)
Sodium: 139 mEq/L (ref 135–145)
Total Bilirubin: 0.4 mg/dL (ref 0.3–1.2)
Total Protein: 6.4 g/dL (ref 6.0–8.3)

## 2011-10-12 LAB — LIPID PANEL
Cholesterol: 314 mg/dL — ABNORMAL HIGH (ref 0–200)
HDL: 53 mg/dL (ref >39)
LDL Cholesterol: 234 mg/dL — ABNORMAL HIGH (ref 0–99)
Total CHOL/HDL Ratio: 5.9 Ratio
Triglycerides: 137 mg/dL (ref <150)
VLDL: 27 mg/dL (ref 0–40)

## 2011-10-16 NOTE — Progress Notes (Signed)
Call patient regarding labs result. Per patient was not taking her cholesterol medication. However she will continue to take her Rx Crestor until she see her primary care doctor.

## 2011-10-24 ENCOUNTER — Emergency Department (HOSPITAL_COMMUNITY): Payer: 59

## 2011-10-24 ENCOUNTER — Emergency Department (HOSPITAL_COMMUNITY)
Admission: EM | Admit: 2011-10-24 | Discharge: 2011-10-25 | Disposition: A | Discharge status: Home / Self Care | Payer: 59 | Attending: Emergency Medicine | Admitting: Emergency Medicine

## 2011-10-24 ENCOUNTER — Encounter (HOSPITAL_COMMUNITY): Payer: Self-pay | Admitting: Emergency Medicine

## 2011-10-24 DIAGNOSIS — X58XXXA Exposure to other specified factors, initial encounter: Secondary | ICD-10-CM | POA: Insufficient documentation

## 2011-10-24 DIAGNOSIS — S139XXA Sprain of joints and ligaments of unspecified parts of neck, initial encounter: Secondary | ICD-10-CM

## 2011-10-24 DIAGNOSIS — E7800 Pure hypercholesterolemia, unspecified: Secondary | ICD-10-CM | POA: Insufficient documentation

## 2011-10-24 DIAGNOSIS — R1110 Vomiting, unspecified: Secondary | ICD-10-CM | POA: Insufficient documentation

## 2011-10-24 DIAGNOSIS — R011 Cardiac murmur, unspecified: Secondary | ICD-10-CM | POA: Insufficient documentation

## 2011-10-24 DIAGNOSIS — R51 Headache: Secondary | ICD-10-CM

## 2011-10-24 DIAGNOSIS — I059 Rheumatic mitral valve disease, unspecified: Secondary | ICD-10-CM | POA: Insufficient documentation

## 2011-10-24 MED ORDER — HYDROMORPHONE HCL PF 1 MG/ML IJ SOLN
1.0000 mg | Freq: Once | INTRAMUSCULAR | Status: AC
  Administered 2011-10-24: 1 mg via INTRAVENOUS
  Filled 2011-10-24: qty 1

## 2011-10-24 MED ORDER — IOHEXOL 350 MG/ML SOLN
50.0000 mL | Freq: Once | INTRAVENOUS | Status: AC | PRN
  Administered 2011-10-24: 50 mL via INTRAVENOUS

## 2011-10-24 NOTE — ED Provider Notes (Signed)
History     CSN: [id number]  Arrival date & time 10/24/11  2045   First MD Initiated Contact with Patient 10/24/11 2158      Chief Complaint  Patient presents with  . Emesis    (Consider location/radiation/quality/duration/timing/severity/associated sxs/prior treatment) Patient is a 45 y.o. female presenting with headaches. The history is provided by the patient. No language interpreter was used.  Headache  This is a new problem. The current episode started 3 to 5 hours ago. The problem occurs constantly. The problem has not changed since onset.The headache is associated with nothing. The pain is located in the occipital region. The quality of the pain is described as throbbing and sharp. The pain is at a severity of 8/10. The pain is severe. The pain radiates to the left neck and right neck. Associated symptoms include vomiting. Pertinent negatives include no anorexia, no fever and no shortness of breath. Associated symptoms comments: Vomiting immediately prior to headache onset. She has tried nothing for the symptoms. The treatment provided no relief.    Past Medical History  Diagnosis Date  . Migraine   . History of endometriosis   . Depression   . High cholesterol   . Incontinence   . Menopausal symptoms   . Allergy   . Anxiety   . Heart murmur     MITRAL VALVE PROLASP  . Chronic kidney disease     KIDNEYSTONES  . Thyroid disease   . Osteoporosis     Past Surgical History  Procedure Date  . Total abdominal hysterectomy w/ bilateral salpingoophorectomy   . Tvt 2007  . Appendectomy   . Breast enhancement surgery     SALINE  . Abdominal hysterectomy   . Breast surgery   . Bunionectomy     BILATERAL FEET  . Ankle surgery     RIGHT  . Wisdom tooth extraction     X 4    Family History  Problem Relation Age of Onset  . Diabetes Mother   . Heart disease Mother   . Hyperlipidemia Mother   . Tuberculosis Father   . Alcohol abuse Father   . Heart disease Sister    . Hyperlipidemia Sister   . Heart disease Brother   . Hyperlipidemia Brother   . Diabetes Maternal Grandmother   . Diabetes Maternal Grandfather     History  Substance Use Topics  . Smoking status: Former Games developer  . Smokeless tobacco: Not on file  . Alcohol Use: Yes     OCCASIONALLY    OB History    Grav Para Term Preterm Abortions TAB SAB Ect Mult Living   2 2        2       Review of Systems  Constitutional: Negative for fever and chills.  Respiratory: Negative for cough and shortness of breath.   Gastrointestinal: Positive for vomiting. Negative for anorexia.  Neurological: Positive for headaches.  All other systems reviewed and are negative.    Allergies  Pseudoeph-doxylamine-dm-apap  Home Medications   Current Outpatient Rx  Name Route Sig Dispense Refill  . ARMOUR THYROID 60 MG PO TABS Oral Take 60 mg by mouth daily.     . ATORVASTATIN CALCIUM 40 MG PO TABS Oral Take 40 mg by mouth daily.      Marland Kitchen ESTRADIOL 1 MG PO TABS Oral Take 1 tablet (1 mg total) by mouth daily. 30 tablet 3  . FLUTICASONE PROPIONATE 50 MCG/ACT NA SUSP Nasal Place 1 spray into the nose  daily.     Marland Kitchen OMEPRAZOLE 20 MG PO CPDR        BP 129/81  Pulse 80  Temp(Src) 98.2 F (36.8 C) (Oral)  Resp 12  SpO2 96%  Physical Exam  Nursing note and vitals reviewed. Constitutional: She is oriented to person, place, and time. She appears well-developed and well-nourished. No distress.  HENT:  Head: Normocephalic and atraumatic.  Eyes: EOM are normal. Pupils are equal, round, and reactive to light.  Neck: Normal range of motion. Neck supple.  Cardiovascular: Normal rate and regular rhythm.  Exam reveals no friction rub.   No murmur heard. Pulmonary/Chest: Effort normal and breath sounds normal. No respiratory distress. She has no wheezes. She has no rales.  Abdominal: Soft. She exhibits no distension. There is no tenderness. There is no rebound.  Musculoskeletal: Normal range of motion. She  exhibits no edema.  Neurological: She is alert and oriented to person, place, and time.  Skin: She is not diaphoretic.    ED Course  Procedures (including critical care time)  Labs Reviewed - No data to display No results found.   1. Headache   2. Neck sprain       MDM  49F p/w headache after vomiting. Had acute episode of vomiting today, then headache began after that. No continued vomiting, no neurologic symptoms/complaints. Denies fevers. Associated neck pain. AFVSS on arrival. Neuro intact, exam benign. Neck supple with full ROM. Mild point tenderness on upper c-spine.  Patient's thunderclap headache concerning for possible aneurysm rupture since occurred immediately after forceful vomiting. CT/CTA head ordered. CTs negative, no signs of intracranial bleed. CTs reviewed by me. Dr. Jeraldine Loots spoke directly with Radiology on the phone concerning the results. Clinical picture c/w neck sprain. No concern for meningitis - afebrile, headache after known event. Will discharge patient home with pain meds and muscle relaxers. Given strict return precautions and instructed to f/u with PCP in a few days.        Elwin Mocha, MD 10/25/11 [phone]

## 2011-10-24 NOTE — ED Notes (Signed)
Pt reports diarrhea this am after eating lots of 'greens'.  States that she started to vomit at 7pm tonight and after vomiting got a pressure HA and burning sensation in her head.  Reports that she vomited multiple times.  Abdomen nontender, denies abdominal cramping at this time.  Skin warm, dry and intact.  Neuro intact.  Pt ambulatory in dept without difficulty.

## 2011-10-24 NOTE — ED Notes (Signed)
Pt st's she started having diarrhea this am then started vomiting this pm  Also c/o headache

## 2011-10-25 MED ORDER — DIAZEPAM 5 MG PO TABS: 5.0000 mg | ORAL_TABLET | Freq: Four times a day (QID) | ORAL | Status: AC | PRN

## 2011-10-25 MED ORDER — HYDROCODONE-ACETAMINOPHEN 5-500 MG PO TABS: 1.0000 | ORAL_TABLET | Freq: Four times a day (QID) | ORAL | Status: AC | PRN

## 2011-10-25 NOTE — Discharge Instructions (Signed)
Cervical Sprain and Strain A cervical sprain is an injury to the neck. The injury can include either over-stretching or even small tears in the ligaments that hold the bones of the neck in place. A strain affects muscles and tendons. Minor injuries usually only involve ligaments and muscles. Because the different parts of the neck are so close together, more severe injuries can involve both sprain and strain. These injuries can affect the muscles, ligaments, tendons, discs, and nerves in the neck. CAUSES  An injury may be the result of a direct blow or from certain habits that can lead to the symptoms noted above.  Injury from:   Contact sports (such as football, rugby, wrestling, hockey, auto racing, gymnastics, diving, martial arts, and boxing).   Motor vehicle accidents.   Whiplash injuries (see image at right). These are common. They occur when the neck is forcefully whipped or forced backward and/or forward.   Falls.   Lifestyle or awkward postures:   Cradling a telephone between the ear and shoulder.   Sitting in a chair that offers no support.   Working at an ill-designed computer station.   Activities that require hours of repeated or long periods of looking up (stretching the neck backward) or looking down (bending the head/neck forward).  SYMPTOMS   Pain, soreness, stiffness, or burning sensation in the front, back, or sides of the neck. This may develop immediately after injury. Onset of discomfort may also develop slowly and not begin for 24 hours or more.   Shoulder and/or upper back pain.   Limits to the normal movement of the neck.   Headache.   Dizziness.   Weakness and/or abnormal sensation (such as numbness or tingling) of one or both arms and/or hands.   Muscle spasm.   Difficulty with swallowing or chewing.   Tenderness and swelling at the injury site.  DIAGNOSIS  Most of the time, your caregiver can diagnose this problem with a careful history and  examination. The history will include information about known problems (such as arthritis in the neck) or a previous neck injury. X-rays may be ordered to find out if there is a different problem. X-rays can also help to find problems with the bones of the neck not related to the injury or current symptoms. TREATMENT  Several treatment options are available to help pain, spasm, and other symptoms. They include:  Cold helps relieve pain and reduce inflammation. Cold should be applied for 10 to 15 minutes every 2 to 3 hours after any activity that aggravates your symptoms. Use ice packs or an ice massage. Place a towel or cloth in between your skin and the ice pack.   Medication:   Only take over-the-counter or prescription medicines for pain, discomfort, or fever as directed by your caregiver.   Pain relievers or muscle relaxants may be prescribed. Use only as directed and only as much as you need.   Change in the activity that caused the problem. This might include using a headset with a telephone so that the phone is not propped between your ear and shoulder.   Neck collar. Your caregiver may recommend temporary use of a soft cervical collar.   Work station. Changes may be needed in your work place. A better sitting position and/or better posture during work may be part of your treatment.   Physical Therapy. Your caregiver may recommend physical therapy. This can include instructions in the use of stretching and strengthening exercises. Improvement in posture is important.   Exercises and posture training can help stabilize the neck and strengthen muscles and keep symptoms from returning.  HOME CARE INSTRUCTIONS  Other than formal physical therapy, all treatments above can be done at home. Even when not at work, it is important to be conscious of your posture and of activities that can cause a return of symptoms. Most cervical sprains and/or strains are better in 1-3 weeks. As you improve and  increase activities, doing a warm up and stretching before the activity will help prevent recurrent problems. SEEK MEDICAL CARE IF:   Pain is not effectively controlled with medication.   You feel unable to decrease pain medication over time as planned.   Activity level is not improving as planned and/or expected.  SEEK IMMEDIATE MEDICAL CARE IF:   While using medication, you develop any bleeding, stomach upset, or signs of an allergic reaction.   Symptoms get worse, become intolerable, and are not helped by medications.   New, unexplained symptoms develop.   You experience numbness, tingling, weakness, or paralysis of any part of your body.  MAKE SURE YOU:   Understand these instructions.   Will watch your condition.   Will get help right away if you are not doing well or get worse.  Document Released: 08/13/2007 Document Revised: 06/28/2011 Document Reviewed: 08/13/2007 [facility] Patient Information 2012 [place], [place].Cervical Sprain and Strain A cervical sprain is an injury to the neck. The injury can include either over-stretching or even small tears in the ligaments that hold the bones of the neck in place. A strain affects muscles and tendons. Minor injuries usually only involve ligaments and muscles. Because the different parts of the neck are so close together, more severe injuries can involve both sprain and strain. These injuries can affect the muscles, ligaments, tendons, discs, and nerves in the neck. CAUSES  An injury may be the result of a direct blow or from certain habits that can lead to the symptoms noted above.  Injury from:   Contact sports (such as football, rugby, wrestling, hockey, auto racing, gymnastics, diving, martial arts, and boxing).   Motor vehicle accidents.   Whiplash injuries (see image at right). These are common. They occur when the neck is forcefully whipped or forced backward and/or forward.   Falls.   Lifestyle or awkward postures:    Cradling a telephone between the ear and shoulder.   Sitting in a chair that offers no support.   Working at an Theme park manager station.   Activities that require hours of repeated or long periods of looking up (stretching the neck backward) or looking down (bending the head/neck forward).  SYMPTOMS   Pain, soreness, stiffness, or burning sensation in the front, back, or sides of the neck. This may develop immediately after injury. Onset of discomfort may also develop slowly and not begin for 24 hours or more.   Shoulder and/or upper back pain.   Limits to the normal movement of the neck.   Headache.   Dizziness.   Weakness and/or abnormal sensation (such as numbness or tingling) of one or both arms and/or hands.   Muscle spasm.   Difficulty with swallowing or chewing.   Tenderness and swelling at the injury site.  DIAGNOSIS  Most of the time, your caregiver can diagnose this problem with a careful history and examination. The history will include information about known problems (such as arthritis in the neck) or a previous neck injury. X-rays may be ordered to find out if there is a  different problem. X-rays can also help to find problems with the bones of the neck not related to the injury or current symptoms. TREATMENT  Several treatment options are available to help pain, spasm, and other symptoms. They include:  Cold helps relieve pain and reduce inflammation. Cold should be applied for 10 to 15 minutes every 2 to 3 hours after any activity that aggravates your symptoms. Use ice packs or an ice massage. Place a towel or cloth in between your skin and the ice pack.   Medication:   Only take over-the-counter or prescription medicines for pain, discomfort, or fever as directed by your caregiver.   Pain relievers or muscle relaxants may be prescribed. Use only as directed and only as much as you need.   Change in the activity that caused the problem. This might  include using a headset with a telephone so that the phone is not propped between your ear and shoulder.   Neck collar. Your caregiver may recommend temporary use of a soft cervical collar.   Work station. Changes may be needed in your work place. A better sitting position and/or better posture during work may be part of your treatment.   Physical Therapy. Your caregiver may recommend physical therapy. This can include instructions in the use of stretching and strengthening exercises. Improvement in posture is important. Exercises and posture training can help stabilize the neck and strengthen muscles and keep symptoms from returning.  HOME CARE INSTRUCTIONS  Other than formal physical therapy, all treatments above can be done at home. Even when not at work, it is important to be conscious of your posture and of activities that can cause a return of symptoms. Most cervical sprains and/or strains are better in 1-3 weeks. As you improve and increase activities, doing a warm up and stretching before the activity will help prevent recurrent problems. SEEK MEDICAL CARE IF:   Pain is not effectively controlled with medication.   You feel unable to decrease pain medication over time as planned.   Activity level is not improving as planned and/or expected.  SEEK IMMEDIATE MEDICAL CARE IF:   While using medication, you develop any bleeding, stomach upset, or signs of an allergic reaction.   Symptoms get worse, become intolerable, and are not helped by medications.   New, unexplained symptoms develop.   You experience numbness, tingling, weakness, or paralysis of any part of your body.  MAKE SURE YOU:   Understand these instructions.   Will watch your condition.   Will get help right away if you are not doing well or get worse.  Document Released: 08/13/2007 Document Revised: 06/28/2011 Document Reviewed: 08/13/2007 [facility] Patient Information 2012 [place], [place].

## 2011-10-26 NOTE — ED Provider Notes (Signed)
  I performed a history and physical examination of [name] and discussed her management with Dr. Gwendolyn Grant.  I agree with the history, physical, assessment, and plan of care, with the following exceptions: None  [name] with history of migraines now presenting with sudden onset neck discomfort following vomiting.  On exam the patient has no neurologic deficits, has tenderness to palpation about the neck. The patient treatment and studies, presentation, absence of distress, absence of neurologic findings are consistent with cervical paraspinal muscular strain versus sprain  Elyn Krogh, Elvis Coil, MD 10/26/11 0009

## 2012-01-09 ENCOUNTER — Ambulatory Visit (INDEPENDENT_AMBULATORY_CARE_PROVIDER_SITE_OTHER): Payer: 59 | Admitting: Family Medicine

## 2012-01-09 ENCOUNTER — Encounter: Payer: Self-pay | Admitting: Family Medicine

## 2012-01-09 DIAGNOSIS — N951 Menopausal and female climacteric states: Secondary | ICD-10-CM

## 2012-01-09 DIAGNOSIS — F32A Depression, unspecified: Secondary | ICD-10-CM | POA: Insufficient documentation

## 2012-01-09 DIAGNOSIS — F3289 Other specified depressive episodes: Secondary | ICD-10-CM

## 2012-01-09 DIAGNOSIS — Z780 Asymptomatic menopausal state: Secondary | ICD-10-CM

## 2012-01-09 DIAGNOSIS — F329 Major depressive disorder, single episode, unspecified: Secondary | ICD-10-CM

## 2012-01-09 HISTORY — DX: Asymptomatic menopausal state: Z78.0

## 2012-01-09 MED ORDER — FLUOXETINE HCL 20 MG PO CAPS: 20.0000 mg | ORAL_CAPSULE | Freq: Every day | ORAL | Status: DC

## 2012-01-09 NOTE — Progress Notes (Signed)
  Subjective:    Patient ID: [name], female    DOB: 12/10/1965, 46 y.o.   MRN: [id number]  HPI Here today for f/u.  Last seen in Dec. By Dr. Macon Large and started on estradiol and effexor for depression and issues with menopause.  Her hot flashes are well controlled and she is feeling much better from that perspective.  She continues to have issues with anger/temper and mood.  Denies suicidal ideation or hearing voices.  Effexor did not work secondary to side effects.  She is interested in starting Prozac instead.   Review of Systems  Psychiatric/Behavioral: Positive for behavioral problems, dysphoric mood and agitation. Negative for suicidal ideas, hallucinations, confusion and self-injury. The patient is hyperactive.        Objective:   Physical Exam  Vitals reviewed. Constitutional: She appears well-developed and well-nourished.  HENT:  Head: Normocephalic and atraumatic.  Eyes: No scleral icterus.  Pulmonary/Chest: Effort normal.  Abdominal: Soft.  Skin: Skin is warm.  Psychiatric: Her affect is blunt. Her affect is not inappropriate. Her speech is not rapid and/or pressured. She is not aggressive, is not hyperactive and not combative. Thought content is not delusional. Cognition and memory are not impaired. She does not express impulsivity. She expresses no homicidal and no suicidal ideation. She expresses no suicidal plans and no homicidal plans.          Assessment & Plan:   1. Depression  FLUoxetine (PROZAC) 20 MG capsule  2. Menopause    Start with 1/2 tab daily x 7 days, then increase to 1 tab daily.  Continue Estradiol.

## 2012-01-09 NOTE — Patient Instructions (Signed)
Depression  Depression is a strong emotion of feeling unhappy that can last for weeks, months, or even longer. Depression causes problems with the ability to function in life. It upsets your:   Relationships.   Sleep.   Eating habits.   Work habits.  HOME CARE  Take all medicine as told by your doctor.   Talk with a therapist, counselor, or friend.   Eat a healthy diet.   Exercise regularly.   Do not drink alcohol or use drugs.  GET HELP RIGHT AWAY IF: You start to have thoughts about hurting yourself or others. MAKE SURE YOU:  Understand these instructions.   Will watch your condition.   Will get help right away if you are not doing well or get worse.  Document Released: 11/18/2010 Document Revised: 10/05/2011 Document Reviewed: 11/18/2010 [employer] Patient Information 2012 [employer]. 

## 2012-01-16 ENCOUNTER — Other Ambulatory Visit: Payer: Self-pay | Admitting: Obstetrics & Gynecology

## 2012-01-16 DIAGNOSIS — Z1231 Encounter for screening mammogram for malignant neoplasm of breast: Secondary | ICD-10-CM

## 2012-01-26 ENCOUNTER — Other Ambulatory Visit: Payer: Self-pay | Admitting: Obstetrics & Gynecology

## 2012-02-06 ENCOUNTER — Ambulatory Visit: Payer: 59 | Admitting: Family Medicine

## 2012-02-23 ENCOUNTER — Ambulatory Visit: Payer: 59

## 2012-02-29 ENCOUNTER — Other Ambulatory Visit: Payer: Self-pay | Admitting: Obstetrics & Gynecology

## 2012-03-07 ENCOUNTER — Ambulatory Visit: Payer: 59

## 2012-03-12 ENCOUNTER — Ambulatory Visit (INDEPENDENT_AMBULATORY_CARE_PROVIDER_SITE_OTHER): Payer: 59 | Admitting: Family Medicine

## 2012-03-12 ENCOUNTER — Encounter: Payer: Self-pay | Admitting: Family Medicine

## 2012-03-12 VITALS — BP 110/63 | HR 67 | Ht 60.0 in | Wt 151.0 lb

## 2012-03-12 DIAGNOSIS — N951 Menopausal and female climacteric states: Secondary | ICD-10-CM

## 2012-03-12 DIAGNOSIS — F329 Major depressive disorder, single episode, unspecified: Secondary | ICD-10-CM

## 2012-03-12 DIAGNOSIS — F32A Depression, unspecified: Secondary | ICD-10-CM

## 2012-03-12 DIAGNOSIS — F3289 Other specified depressive episodes: Secondary | ICD-10-CM

## 2012-03-12 DIAGNOSIS — Z780 Asymptomatic menopausal state: Secondary | ICD-10-CM

## 2012-03-12 MED ORDER — FLUOXETINE HCL 40 MG PO CAPS: 40.0000 mg | ORAL_CAPSULE | Freq: Every day | ORAL | Status: DC

## 2012-03-12 MED ORDER — ESTRADIOL 1 MG PO TABS: 1.5000 mg | ORAL_TABLET | Freq: Every day | ORAL | Status: DC

## 2012-03-12 NOTE — Patient Instructions (Signed)

## 2012-03-12 NOTE — Progress Notes (Signed)
  Subjective:    Patient ID: [name], female    DOB: Mar 11, 1966, 46 y.o.   MRN: [id number]  HPI  Here today for f/u. Continues to be plagued with menopausal sx's.  Last seen in 3/13 with rx given for prozac and 1mg  estradiol.  She is still having anger issues and night sweats which drench the sheets.  She is desiring an increase in both medications to see if this will improve her sx's.  She has had a long h/o menopause, which is surgical starting in 2006.  She has been on injectable forms of estrogen.  She is allergic to adhesive.  She is a former smoker but quit 6 years ago.  Review of Systems  Constitutional: Negative for fever and activity change.  Respiratory: Negative for shortness of breath.   Cardiovascular: Negative for chest pain.  Gastrointestinal: Negative for abdominal pain.  Genitourinary: Negative for menstrual problem.  Psychiatric/Behavioral: Positive for behavioral problems and agitation.       Objective:   Physical Exam  Vitals reviewed. Constitutional: She appears well-developed and well-nourished.  HENT:  Head: Normocephalic and atraumatic.  Neck: Neck supple.  Cardiovascular: Normal rate.   Pulmonary/Chest: Effort normal.  Abdominal: Soft.          Assessment & Plan:   1. Depression  FLUoxetine (PROZAC) 40 MG capsule  2. Menopause    Increase estradiol to 1.5 mg daily  RTC in 3 mos.

## 2012-03-12 NOTE — Progress Notes (Signed)
Patient is here to discuss worsening night sweats and mood swings.  Having increased fatigue and family says that she is on edge all the time.  She even went to an anger management class to see if it might help.  Wonders if she needs to increase prozac and hrt.  She is going to sleep by 6pm

## 2012-03-12 NOTE — Assessment & Plan Note (Signed)
Still with night sweats, increase estradiol and Prozac

## 2012-10-09 ENCOUNTER — Other Ambulatory Visit: Payer: Self-pay | Admitting: Family Medicine

## 2012-10-09 DIAGNOSIS — R310 Gross hematuria: Secondary | ICD-10-CM

## 2012-10-10 ENCOUNTER — Ambulatory Visit
Admission: RE | Admit: 2012-10-10 | Discharge: 2012-10-10 | Disposition: A | Discharge status: Home / Self Care | Payer: 59 | Source: Ambulatory Visit | Attending: Family Medicine | Admitting: Family Medicine

## 2012-10-10 DIAGNOSIS — R310 Gross hematuria: Secondary | ICD-10-CM

## 2012-12-19 ENCOUNTER — Other Ambulatory Visit: Payer: Self-pay | Admitting: Obstetrics & Gynecology

## 2013-01-14 ENCOUNTER — Ambulatory Visit (HOSPITAL_COMMUNITY): Payer: 59

## 2013-01-17 ENCOUNTER — Ambulatory Visit (HOSPITAL_COMMUNITY)
Admission: RE | Admit: 2013-01-17 | Discharge: 2013-01-17 | Disposition: A | Discharge status: Home / Self Care | Payer: 59 | Source: Ambulatory Visit | Attending: Obstetrics & Gynecology | Admitting: Obstetrics & Gynecology

## 2013-01-17 DIAGNOSIS — Z1231 Encounter for screening mammogram for malignant neoplasm of breast: Secondary | ICD-10-CM | POA: Insufficient documentation

## 2013-02-21 ENCOUNTER — Telehealth: Payer: Self-pay | Admitting: Family Medicine

## 2013-02-21 MED ORDER — HYDROCODONE-ACETAMINOPHEN 5-325 MG PO TABS: 1.0000 | ORAL_TABLET | ORAL | Status: DC | PRN

## 2013-02-21 NOTE — Telephone Encounter (Signed)
norco 5/325 q 4 hrs prn #15, NTBS if worsening.

## 2013-02-21 NOTE — Telephone Encounter (Signed)
Pt states she has a kidney stone and wants something for pain.Can we call her something in please?

## 2013-02-21 NOTE — Telephone Encounter (Signed)
Pt aware per vm.  

## 2013-02-21 NOTE — Telephone Encounter (Signed)
Rx Refilled  

## 2013-02-22 ENCOUNTER — Other Ambulatory Visit: Payer: Self-pay | Admitting: Family Medicine

## 2013-02-25 ENCOUNTER — Ambulatory Visit (INDEPENDENT_AMBULATORY_CARE_PROVIDER_SITE_OTHER): Payer: 59 | Admitting: Family Medicine

## 2013-02-25 ENCOUNTER — Encounter: Payer: Self-pay | Admitting: Family Medicine

## 2013-02-25 VITALS — BP 110/70 | HR 77 | Temp 98.3°F | Resp 14 | Wt 140.0 lb

## 2013-02-25 DIAGNOSIS — N200 Calculus of kidney: Secondary | ICD-10-CM

## 2013-02-25 DIAGNOSIS — R319 Hematuria, unspecified: Secondary | ICD-10-CM

## 2013-02-25 DIAGNOSIS — R300 Dysuria: Secondary | ICD-10-CM

## 2013-02-25 LAB — URINALYSIS, ROUTINE W REFLEX MICROSCOPIC
Bilirubin Urine: NEGATIVE
Glucose, UA: NEGATIVE mg/dL
Hgb urine dipstick: NEGATIVE
Ketones, ur: NEGATIVE mg/dL
Leukocytes, UA: NEGATIVE
Nitrite: NEGATIVE
Protein, ur: NEGATIVE mg/dL
Specific Gravity, Urine: 1.01 (ref 1.005–1.030)
Urobilinogen, UA: 0.2 mg/dL (ref 0.0–1.0)
pH: 7 (ref 5.0–8.0)

## 2013-02-25 MED ORDER — SUMATRIPTAN SUCCINATE 6 MG/0.5ML ~~LOC~~ SOLN: 6.0000 mg | SUBCUTANEOUS | Status: DC | PRN

## 2013-02-25 MED ORDER — OXYCODONE-ACETAMINOPHEN 5-325 MG PO TABS
1.0000 | ORAL_TABLET | Freq: Three times a day (TID) | ORAL | Status: DC | PRN
Start: 2013-02-25 — End: 2014-08-10

## 2013-02-25 NOTE — Progress Notes (Signed)
  Subjective:    Patient ID: [name], female    DOB: [birth year], 47 y.o.   MRN: [id number]  HPI Patient reports one week of right-sided low back pain that radiates to her groin. The pain is colicky in nature. She reports one episode of gross hematuria earlier in the week. She is currently taking pain killers with minimal relief. She denies any dysuria, frequency, or urgency. She denies any fever. Urinalysis today is negative for any hematuria or pyuria. Past Medical History  Diagnosis Date  . Migraine   . History of endometriosis   . Depression   . High cholesterol   . Incontinence   . Menopausal symptoms   . Allergy   . Anxiety   . Heart murmur     MITRAL VALVE PROLASP  . Chronic kidney disease     KIDNEYSTONES  . Thyroid disease   . Osteoporosis    Current Outpatient Prescriptions on File Prior to Visit  Medication Sig Dispense Refill  . ARMOUR THYROID 60 MG tablet Take 60 mg by mouth daily.       . fluticasone (FLONASE) 50 MCG/ACT nasal spray Place 1 spray into the nose daily.       Marland Kitchen omeprazole (PRILOSEC) 20 MG capsule        No current facility-administered medications on file prior to visit.   History   Social History  . Marital Status: Married    Spouse Name: N/A    Number of Children: N/A  . Years of Education: N/A   Occupational History  . Not on file.   Social History Main Topics  . Smoking status: Former Games developer  . Smokeless tobacco: Not on file  . Alcohol Use: Yes     Comment: OCCASIONALLY  . Drug Use: No  . Sexually Active: Yes -- Female partner(s)    Birth Control/ Protection: Surgical   Other Topics Concern  . Not on file   Social History Narrative  . No narrative on file   Allergies  Allergen Reactions  . Pseudoeph-Doxylamine-Dm-Apap     REACTION: keeps awake, legs constantly moving      Review of Systems    review of systems is otherwise negative Objective:   Physical Exam  Constitutional: She appears well-developed and  well-nourished.  Abdominal: Soft. Bowel sounds are normal. She exhibits no distension. There is no tenderness. There is no rebound and no guarding.  Musculoskeletal: Normal range of motion. She exhibits no edema and no tenderness.          Assessment & Plan:  1. Dysuria Urinalysis reveals no infection. - Urinalysis, Routine w reflex microscopic  2. Nephrolithiasis I suspect kidney stone and, gave the patient Percocet 5/325 by mouth every 4 hours when necessary pain.  Obtain CT scan to evaluate for retained stone, or stone greater than 10 mm given the lack of hematuria, and the symptoms which are prolonged. - CT Abdomen Pelvis Wo Contrast; Future  3. Hematuria - CT Abdomen Pelvis Wo Contrast; Future

## 2013-02-26 ENCOUNTER — Ambulatory Visit
Admission: RE | Admit: 2013-02-26 | Discharge: 2013-02-26 | Disposition: A | Discharge status: Home / Self Care | Payer: 59 | Source: Ambulatory Visit | Attending: Family Medicine | Admitting: Family Medicine

## 2013-02-26 DIAGNOSIS — R319 Hematuria, unspecified: Secondary | ICD-10-CM

## 2013-02-26 DIAGNOSIS — N200 Calculus of kidney: Secondary | ICD-10-CM

## 2013-02-28 ENCOUNTER — Telehealth: Payer: Self-pay | Admitting: Family Medicine

## 2013-02-28 NOTE — Telephone Encounter (Signed)
There was no problem seen on the CT that would require narcotics.  If she needs more pain meds, I need to see her.

## 2013-02-28 NOTE — Telephone Encounter (Signed)
Pt states that oxycodone is making her feel nauseous and wants to know can you give her something else..she says hydrocodone doesn't make her sick. Can we call something else in?

## 2013-03-03 NOTE — Telephone Encounter (Signed)
.  Patient aware And has appt. 03/05/13

## 2013-03-05 ENCOUNTER — Encounter: Payer: Self-pay | Admitting: Family Medicine

## 2013-03-05 ENCOUNTER — Ambulatory Visit (INDEPENDENT_AMBULATORY_CARE_PROVIDER_SITE_OTHER): Payer: 59 | Admitting: Family Medicine

## 2013-03-05 VITALS — BP 110/76 | HR 68 | Temp 98.4°F | Resp 16 | Wt 140.0 lb

## 2013-03-05 DIAGNOSIS — M545 Low back pain, unspecified: Secondary | ICD-10-CM

## 2013-03-05 MED ORDER — CYCLOBENZAPRINE HCL 10 MG PO TABS: 10.0000 mg | ORAL_TABLET | Freq: Three times a day (TID) | ORAL | Status: DC | PRN

## 2013-03-05 NOTE — Progress Notes (Signed)
  Subjective:    Patient ID: [name], female    DOB: April 10, 1966, 47 y.o.   MRN: [id number]  HPI  Patient is here for followup from last office visit. She continues to have low back pain. The hematuria has stopped. A CT urogram revealed no kidney stone that would cause the bleeding. She no longer has dysuria. She has no fevers. She has pain in her right lower back with walking, standing, and forward flexion.  She denies sciatica, paresthesias, or dysesthesias in her legs.  She has no symptoms of cauda equina syndrome. Past Medical History  Diagnosis Date  . Migraine   . History of endometriosis   . Depression   . High cholesterol   . Incontinence   . Menopausal symptoms   . Allergy   . Anxiety   . Heart murmur     MITRAL VALVE PROLASP  . Chronic kidney disease     KIDNEYSTONES  . Thyroid disease   . Osteoporosis    Current Outpatient Prescriptions on File Prior to Visit  Medication Sig Dispense Refill  . ARMOUR THYROID 60 MG tablet Take 60 mg by mouth daily.       . fluticasone (FLONASE) 50 MCG/ACT nasal spray Place 1 spray into the nose daily.       Marland Kitchen omeprazole (PRILOSEC) 20 MG capsule       . oxyCODONE-acetaminophen (ROXICET) 5-325 MG per tablet Take 1 tablet by mouth every 8 (eight) hours as needed for pain.  20 tablet  0  . rosuvastatin (CRESTOR) 10 MG tablet Take 10 mg by mouth daily.      . SUMAtriptan (IMITREX) 6 MG/0.5ML SOLN injection Inject 0.5 mLs (6 mg total) into the skin every 2 (two) hours as needed for migraine or headache. F  6 vial  2   No current facility-administered medications on file prior to visit.   Allergies  Allergen Reactions  . Pseudoeph-Doxylamine-Dm-Apap     REACTION: keeps awake, legs constantly moving      Review of Systems Remainder of review of systems is negative    Objective:   Physical Exam  Vitals reviewed. Cardiovascular: Normal rate and regular rhythm.   Pulmonary/Chest: Effort normal and breath sounds normal.  Abdominal:  Soft. Bowel sounds are normal. She exhibits no distension. There is no tenderness.  Musculoskeletal:       Lumbar back: She exhibits decreased range of motion, tenderness, pain and spasm. She exhibits no bony tenderness.          Assessment & Plan:  1. Low back pain Obtain lumbar spine x-ray to rule out degenerative disc disease. I feel this likely muscle strain. Prescribed Flexeril 10 mg by mouth every 8 hours when necessary pain. Recommend discontinuation of narcotic. We'll also consult physical therapy to try to rehabilitation the muscle that is injured. - DG Lumbar Spine Complete; Future - Ambulatory referral to Physical Therapy

## 2013-03-06 ENCOUNTER — Encounter: Payer: Self-pay | Admitting: Family Medicine

## 2013-03-10 ENCOUNTER — Ambulatory Visit
Admission: RE | Admit: 2013-03-10 | Discharge: 2013-03-10 | Disposition: A | Discharge status: Home / Self Care | Payer: 59 | Source: Ambulatory Visit | Attending: Family Medicine | Admitting: Family Medicine

## 2013-03-10 DIAGNOSIS — M545 Low back pain, unspecified: Secondary | ICD-10-CM

## 2013-09-04 ENCOUNTER — Other Ambulatory Visit: Payer: Self-pay

## 2014-01-16 ENCOUNTER — Other Ambulatory Visit: Payer: Self-pay | Admitting: Family Medicine

## 2014-01-16 NOTE — Telephone Encounter (Signed)
Medication filled x1 with no refills.   Requires office visit before any further refills can be given.  

## 2014-08-06 ENCOUNTER — Telehealth: Payer: Self-pay | Admitting: *Deleted

## 2014-08-06 MED ORDER — NITROFURANTOIN MONOHYD MACRO 100 MG PO CAPS: 100.0000 mg | ORAL_CAPSULE | Freq: Two times a day (BID) | ORAL | Status: DC

## 2014-08-06 NOTE — Telephone Encounter (Signed)
Patient is having pain and burning with urination, she did an OTC test for UTI and it showed blood and leukocytes, she does not have health insurance right now and would like to know if she can something called in.  She already is taking the AZO to alleviate her symptoms.  She will call us back if she is not feeling better to come in to be seen.

## 2014-08-10 ENCOUNTER — Emergency Department (HOSPITAL_COMMUNITY): Payer: Self-pay

## 2014-08-10 ENCOUNTER — Emergency Department (HOSPITAL_COMMUNITY)
Admission: EM | Admit: 2014-08-10 | Discharge: 2014-08-10 | Disposition: A | Discharge status: Home / Self Care | Payer: Self-pay | Attending: Emergency Medicine | Admitting: Emergency Medicine

## 2014-08-10 ENCOUNTER — Encounter (HOSPITAL_COMMUNITY): Payer: Self-pay | Admitting: Emergency Medicine

## 2014-08-10 ENCOUNTER — Emergency Department (HOSPITAL_COMMUNITY): Payer: 59

## 2014-08-10 DIAGNOSIS — Z8739 Personal history of other diseases of the musculoskeletal system and connective tissue: Secondary | ICD-10-CM | POA: Insufficient documentation

## 2014-08-10 DIAGNOSIS — Z8639 Personal history of other endocrine, nutritional and metabolic disease: Secondary | ICD-10-CM | POA: Insufficient documentation

## 2014-08-10 DIAGNOSIS — Z87891 Personal history of nicotine dependence: Secondary | ICD-10-CM | POA: Insufficient documentation

## 2014-08-10 DIAGNOSIS — Y9289 Other specified places as the place of occurrence of the external cause: Secondary | ICD-10-CM | POA: Insufficient documentation

## 2014-08-10 DIAGNOSIS — Z8742 Personal history of other diseases of the female genital tract: Secondary | ICD-10-CM | POA: Insufficient documentation

## 2014-08-10 DIAGNOSIS — N189 Chronic kidney disease, unspecified: Secondary | ICD-10-CM | POA: Insufficient documentation

## 2014-08-10 DIAGNOSIS — Z8659 Personal history of other mental and behavioral disorders: Secondary | ICD-10-CM | POA: Insufficient documentation

## 2014-08-10 DIAGNOSIS — S2231XA Fracture of one rib, right side, initial encounter for closed fracture: Secondary | ICD-10-CM | POA: Insufficient documentation

## 2014-08-10 DIAGNOSIS — Z85828 Personal history of other malignant neoplasm of skin: Secondary | ICD-10-CM | POA: Insufficient documentation

## 2014-08-10 DIAGNOSIS — Z8679 Personal history of other diseases of the circulatory system: Secondary | ICD-10-CM | POA: Insufficient documentation

## 2014-08-10 DIAGNOSIS — Z792 Long term (current) use of antibiotics: Secondary | ICD-10-CM | POA: Insufficient documentation

## 2014-08-10 DIAGNOSIS — W1830XA Fall on same level, unspecified, initial encounter: Secondary | ICD-10-CM | POA: Insufficient documentation

## 2014-08-10 DIAGNOSIS — S3991XA Unspecified injury of abdomen, initial encounter: Secondary | ICD-10-CM | POA: Insufficient documentation

## 2014-08-10 DIAGNOSIS — R011 Cardiac murmur, unspecified: Secondary | ICD-10-CM | POA: Insufficient documentation

## 2014-08-10 DIAGNOSIS — Y9389 Activity, other specified: Secondary | ICD-10-CM | POA: Insufficient documentation

## 2014-08-10 HISTORY — DX: Malignant (primary) neoplasm, unspecified: C80.1

## 2014-08-10 HISTORY — DX: Unspecified malignant neoplasm of skin, unspecified: C44.90

## 2014-08-10 LAB — URINALYSIS, ROUTINE W REFLEX MICROSCOPIC
Bilirubin Urine: NEGATIVE
Glucose, UA: NEGATIVE mg/dL
Hgb urine dipstick: NEGATIVE
Ketones, ur: NEGATIVE mg/dL
Leukocytes, UA: NEGATIVE
Nitrite: NEGATIVE
Protein, ur: NEGATIVE mg/dL
Specific Gravity, Urine: 1.039 — ABNORMAL HIGH (ref 1.005–1.030)
Urobilinogen, UA: 0.2 mg/dL (ref 0.0–1.0)
pH: 7 (ref 5.0–8.0)

## 2014-08-10 MED ORDER — IOHEXOL 300 MG/ML  SOLN
100.0000 mL | Freq: Once | INTRAMUSCULAR | Status: AC | PRN
  Administered 2014-08-10: 100 mL via INTRAVENOUS

## 2014-08-10 MED ORDER — MORPHINE SULFATE 4 MG/ML IJ SOLN
4.0000 mg | Freq: Once | INTRAMUSCULAR | Status: AC
  Administered 2014-08-10: 4 mg via INTRAVENOUS
  Filled 2014-08-10: qty 1

## 2014-08-10 MED ORDER — OXYCODONE-ACETAMINOPHEN 5-325 MG PO TABS: 1.0000 | ORAL_TABLET | ORAL | Status: DC | PRN

## 2014-08-10 NOTE — ED Notes (Signed)
Patient transported to CT 

## 2014-08-10 NOTE — ED Notes (Signed)
Pt c/o right sided rib pain after falling in shower 2 days ago; pt sts pain with inspiration

## 2014-08-10 NOTE — ED Provider Notes (Signed)
CSN: [id number]     Arrival date & time 08/10/14  1258 History   First MD Initiated Contact with Patient 08/10/14 1446     Chief Complaint  Patient presents with  . Fall     (Consider location/radiation/quality/duration/timing/severity/associated sxs/prior Treatment) Patient is a 48 y.o. female presenting with fall. The history is provided by the patient. No language interpreter was used.  Fall This is a new problem. The current episode started in the past 7 days. The problem occurs constantly. The problem has been unchanged. Associated symptoms include abdominal pain and chest pain. Pertinent negatives include no chills, coughing, fever, headaches, nausea, neck pain or vomiting. Associated symptoms comments: She fell while getting out of the shower 2 days ago, hitting the toilet when she fell to the right lower chest wall. Since that time she has had pain in same area, worse with movement and deep breathing. No cough or fever. She denies N, V..    Past Medical History  Diagnosis Date  . Migraine   . History of endometriosis   . Depression   . High cholesterol   . Incontinence   . Menopausal symptoms   . Allergy   . Anxiety   . Heart murmur     MITRAL VALVE PROLASP  . Chronic kidney disease     KIDNEYSTONES  . Thyroid disease   . Osteoporosis   . Cancer   . Skin cancer    Past Surgical History  Procedure Laterality Date  . Total abdominal hysterectomy w/ bilateral salpingoophorectomy    . Tvt  2007  . Appendectomy    . Breast enhancement surgery      SALINE  . Abdominal hysterectomy    . Breast surgery    . Bunionectomy      BILATERAL FEET  . Ankle surgery      RIGHT  . Wisdom tooth extraction      X 4   Family History  Problem Relation Age of Onset  . Diabetes Mother   . Heart disease Mother   . Hyperlipidemia Mother   . Tuberculosis Father   . Alcohol abuse Father   . Heart disease Sister   . Hyperlipidemia Sister   . Heart disease Brother   .  Hyperlipidemia Brother   . Diabetes Maternal Grandmother   . Diabetes Maternal Grandfather    History  Substance Use Topics  . Smoking status: Former Research scientist (life sciences)  . Smokeless tobacco: Not on file  . Alcohol Use: Yes     Comment: OCCASIONALLY   OB History   Grav Para Term Preterm Abortions TAB SAB Ect Mult Living   2 2        2      Review of Systems  Constitutional: Negative for fever and chills.  Respiratory: Negative.  Negative for cough and shortness of breath.        See HPI.  Cardiovascular: Positive for chest pain.  Gastrointestinal: Positive for abdominal pain. Negative for nausea and vomiting.  Genitourinary: Negative.  Negative for hematuria.  Musculoskeletal: Negative.  Negative for back pain and neck pain.  Skin: Negative.   Neurological: Negative.  Negative for syncope and headaches.      Allergies  Pseudoeph-doxylamine-dm-apap  Home Medications   Prior to Admission medications   Medication Sig Start Date End Date Taking? Authorizing Provider  nitrofurantoin, macrocrystal-monohydrate, (MACROBID) 100 MG capsule Take 100 mg by mouth 2 (two) times daily.   Yes Historical Provider, MD   BP 129/62  Pulse 62  Temp(Src) 98.9 F (37.2 C) (Oral)  Resp 16  Ht 5' (1.524 m)  Wt 121 lb (54.885 kg)  BMI 23.63 kg/m2  SpO2 99% Physical Exam  Constitutional: She is oriented to person, place, and time. She appears well-developed and well-nourished. No distress.  HENT:  Head: Normocephalic.  Neck: Normal range of motion. Neck supple.  Cardiovascular: Normal rate.   No murmur heard. Pulmonary/Chest: Effort normal. She has no wheezes. She has no rales.  Right lateral lower chest wall tenderness.   Abdominal: Soft. She exhibits no mass. There is no rebound.  Abdominal tenderness limited to RUQ. Mild guarding.   Musculoskeletal: Normal range of motion.  Neurological: She is alert and oriented to person, place, and time.  Skin: Skin is warm and dry.  Psychiatric: She has a  normal mood and affect.    ED Course  Procedures (including critical care time) Labs Review Labs Reviewed - No data to display  Imaging Review Dg Ribs Unilateral W/chest Right  08/10/2014   CLINICAL DATA:  Slipped and fell in shower 2 days ago. RIGHT lateral rib pain.  EXAM: RIGHT RIBS AND CHEST - 3+ VIEW  COMPARISON:  None.  FINDINGS: Normal cardiac and mediastinal silhouette. Clear lung fields. No visible pneumothorax.  Over the RIGHT lateral eleventh rib, near the area of tenderness, there is a possible nondisplaced fracture, as marked with an arrow on the shallow RIGHT oblique radiograph. Correlate clinically.  BILATERAL breast implants are noted.  IMPRESSION: Possible nondisplaced RIGHT lateral eleventh rib fracture.  No active infiltrates.   Electronically Signed   By: Rolla Flatten M.D.   On: 08/10/2014 14:47     EKG Interpretation None      MDM   Final diagnoses:  None    1. Right rib fracture  Pain is only partially improved in the ED. CT abd negative for organ injury, CXR neg for PTX, normal oxygenation. Will provide incentive spirometer, pain medications for home and give return precautions. Encourage PCP follow up for recheck as needed.    Dewaine Oats, PA-C 08/10/14 1657

## 2014-08-10 NOTE — ED Notes (Signed)
Patient not in room at this time.

## 2014-08-10 NOTE — ED Provider Notes (Signed)
Medical screening examination/treatment/procedure(s) were performed by non-physician practitioner and as supervising physician I was immediately available for consultation/collaboration.   EKG Interpretation None        Pamella Pert, MD 08/10/14 1958

## 2014-08-10 NOTE — Discharge Instructions (Signed)
Rib Fracture °A rib fracture is a break or crack in one of the bones of the ribs. The ribs are a group of long, curved bones that wrap around your chest and attach to your spine. They protect your lungs and other organs in the chest cavity. A broken or cracked rib is often painful, but most do not cause other problems. Most rib fractures heal on their own over time. However, rib fractures can be more serious if multiple ribs are broken or if broken ribs move out of place and push against other structures. °CAUSES  °· A direct blow to the chest. For example, this could happen during contact sports, a car accident, or a fall against a hard object. °· Repetitive movements with high force, such as pitching a baseball or having severe coughing spells. °SYMPTOMS  °· Pain when you breathe in or cough. °· Pain when someone presses on the injured area. °DIAGNOSIS  °Your caregiver will perform a physical exam. Various imaging tests may be ordered to confirm the diagnosis and to look for related injuries. These tests may include a chest X-ray, computed tomography (CT), magnetic resonance imaging (MRI), or a bone scan. °TREATMENT  °Rib fractures usually heal on their own in 1-3 months. The longer healing period is often associated with a continued cough or other aggravating activities. During the healing period, pain control is very important. Medication is usually given to control pain. Hospitalization or surgery may be needed for more severe injuries, such as those in which multiple ribs are broken or the ribs have moved out of place.  °HOME CARE INSTRUCTIONS  °· Avoid strenuous activity and any activities or movements that cause pain. Be careful during activities and avoid bumping the injured rib. °· Gradually increase activity as directed by your caregiver. °· Only take over-the-counter or prescription medications as directed by your caregiver. Do not take other medications without asking your caregiver first. °· Apply ice  to the injured area for the first 1-2 days after you have been treated or as directed by your caregiver. Applying ice helps to reduce inflammation and pain. °¨ Put ice in a plastic bag. °¨ Place a towel between your skin and the bag.   °¨ Leave the ice on for 15-20 minutes at a time, every 2 hours while you are awake. °· Perform deep breathing as directed by your caregiver. This will help prevent pneumonia, which is a common complication of a broken rib. Your caregiver may instruct you to: °¨ Take deep breaths several times a day. °¨ Try to cough several times a day, holding a pillow against the injured area. °¨ Use a device called an incentive spirometer to practice deep breathing several times a day. °· Drink enough fluids to keep your urine clear or pale yellow. This will help you avoid constipation.   °· Do not wear a rib belt or binder. These restrict breathing, which can lead to pneumonia.   °SEEK IMMEDIATE MEDICAL CARE IF:  °· You have a fever.   °· You have difficulty breathing or shortness of breath.   °· You develop a continual cough, or you cough up thick or bloody sputum. °· You feel sick to your stomach (nausea), throw up (vomit), or have abdominal pain.   °· You have worsening pain not controlled with medications.   °MAKE SURE YOU: °· Understand these instructions. °· Will watch your condition. °· Will get help right away if you are not doing well or get worse. °Document Released: 10/16/2005 Document Revised: 06/18/2013 Document Reviewed:   12/18/2012 [employer] Patient Information 2015 [place], [place]. This information is not intended to replace advice given to you by your health care provider. Make sure you discuss any questions you have with your health care provider. Incentive Spirometer An incentive spirometer is a tool that can help keep your lungs clear and active. This tool measures how well you are filling your lungs with each breath. Taking long, deep breaths may help reverse or decrease the  chance of developing breathing (pulmonary) problems (especially infection) following:  Surgery of the chest or abdomen.  Surgery if you have a history of smoking or a lung problem.  A long period of time when you are unable to move or be active. BEFORE THE PROCEDURE   If the spirometer includes an indicator to show your best effort, your nurse or respiratory therapist will set it to a desired goal.  If possible, sit up straight or lean slightly forward. Try not to slouch.  Hold the incentive spirometer in an upright position. INSTRUCTIONS FOR USE  1. Sit on the edge of your bed if possible, or sit up as far as you can in bed or on a chair. 2. Hold the incentive spirometer in an upright position. 3. Breathe out normally. 4. Place the mouthpiece in your mouth and seal your lips tightly around it. 5. Breathe in slowly and as deeply as possible, raising the piston or the ball toward the top of the column. 6. Hold your breath for 3-5 seconds or for as long as possible. Allow the piston or ball to fall to the bottom of the column. 7. Remove the mouthpiece from your mouth and breathe out normally. 8. Rest for a few seconds and repeat Steps 1 through 7 at least 10 times every 1-2 hours when you are awake. Take your time and take a few normal breaths between deep breaths. 9. The spirometer may include an indicator to show your best effort. Use the indicator as a goal to work toward during each repetition. 10. After each set of 10 deep breaths, practice coughing to be sure your lungs are clear. If you have an incision (the cut made at the time of surgery), support your incision when coughing by placing a pillow or rolled-up towels firmly against it. Once you are able to get out of bed, walk around indoors and cough well. You may stop using the incentive spirometer when instructed by your caregiver.  RISKS AND COMPLICATIONS  Breathing too quickly may cause dizziness. At an extreme, this could cause  you to pass out. Take your time so you do not get dizzy or light-headed.  If you are in pain, you may need to take or ask for pain medication before doing incentive spirometry. It is harder to take a deep breath if you are having pain. AFTER USE  Rest and breathe slowly and easily.  It can be helpful to keep a log of your progress. Your caregiver can provide you with a simple table to help with this. If you are using the spirometer at home, follow these instructions: [place] IF:   You are having difficultly using the spirometer.  You have trouble using the spirometer as often as instructed.  Your pain medication is not giving enough relief while using the spirometer.  You develop fever of 100.35F (38.1C) or higher. SEEK IMMEDIATE MEDICAL CARE IF:   You cough up bloody sputum that had not been present before.  You develop fever of 102F (38.9C) or greater.  You develop worsening pain at or near the incision site. MAKE SURE YOU:   Understand these instructions.  Will watch your condition.  Will get help right away if you are not doing well or get worse. Document Released: 02/26/2007 Document Revised: 03/02/2014 Document Reviewed: 04/29/2007 [facility] Patient Information 2015 [place], [place]. This information is not intended to replace advice given to you by your health care provider. Make sure you discuss any questions you have with your health care provider.

## 2014-08-14 ENCOUNTER — Other Ambulatory Visit: Payer: Self-pay

## 2014-08-31 ENCOUNTER — Encounter (HOSPITAL_COMMUNITY): Payer: Self-pay | Admitting: Emergency Medicine

## 2015-02-19 ENCOUNTER — Emergency Department (INDEPENDENT_AMBULATORY_CARE_PROVIDER_SITE_OTHER): Payer: Self-pay

## 2015-02-19 ENCOUNTER — Emergency Department (INDEPENDENT_AMBULATORY_CARE_PROVIDER_SITE_OTHER)
Admission: EM | Admit: 2015-02-19 | Discharge: 2015-02-19 | Disposition: A | Discharge status: Home / Self Care | Payer: Self-pay | Source: Home / Self Care | Attending: Emergency Medicine | Admitting: Emergency Medicine

## 2015-02-19 ENCOUNTER — Encounter (HOSPITAL_COMMUNITY): Payer: Self-pay

## 2015-02-19 DIAGNOSIS — N390 Urinary tract infection, site not specified: Secondary | ICD-10-CM

## 2015-02-19 DIAGNOSIS — N200 Calculus of kidney: Secondary | ICD-10-CM

## 2015-02-19 LAB — POCT URINALYSIS DIP (DEVICE)
Bilirubin Urine: NEGATIVE
Glucose, UA: NEGATIVE mg/dL
Ketones, ur: NEGATIVE mg/dL
Nitrite: POSITIVE — AB
Protein, ur: NEGATIVE mg/dL
Specific Gravity, Urine: 1.025 (ref 1.005–1.030)
Urobilinogen, UA: 0.2 mg/dL (ref 0.0–1.0)
pH: 6.5 (ref 5.0–8.0)

## 2015-02-19 MED ORDER — HYDROCODONE-ACETAMINOPHEN 5-325 MG PO TABS
1.0000 | ORAL_TABLET | ORAL | Status: DC | PRN
Start: 2015-02-19 — End: 2016-01-17

## 2015-02-19 MED ORDER — ONDANSETRON 4 MG PO TBDP
ORAL_TABLET | ORAL | Status: AC
  Filled 2015-02-19: qty 1

## 2015-02-19 MED ORDER — TAMSULOSIN HCL 0.4 MG PO CAPS: 0.4000 mg | ORAL_CAPSULE | Freq: Every day | ORAL | Status: DC

## 2015-02-19 MED ORDER — HYDROCODONE-ACETAMINOPHEN 5-325 MG PO TABS
1.0000 | ORAL_TABLET | Freq: Once | ORAL | Status: AC
  Administered 2015-02-19: 1 via ORAL

## 2015-02-19 MED ORDER — KETOROLAC TROMETHAMINE 60 MG/2ML IM SOLN
INTRAMUSCULAR | Status: AC
  Filled 2015-02-19: qty 2

## 2015-02-19 MED ORDER — ONDANSETRON 4 MG PO TBDP
4.0000 mg | ORAL_TABLET | Freq: Once | ORAL | Status: AC
  Administered 2015-02-19: 4 mg via ORAL

## 2015-02-19 MED ORDER — HYDROCODONE-ACETAMINOPHEN 5-325 MG PO TABS
ORAL_TABLET | ORAL | Status: AC
  Filled 2015-02-19: qty 1

## 2015-02-19 MED ORDER — CIPROFLOXACIN HCL 500 MG PO TABS: 500.0000 mg | ORAL_TABLET | Freq: Two times a day (BID) | ORAL | Status: DC

## 2015-02-19 MED ORDER — ONDANSETRON 4 MG PO TBDP: 4.0000 mg | ORAL_TABLET | Freq: Three times a day (TID) | ORAL | Status: DC | PRN

## 2015-02-19 MED ORDER — IBUPROFEN 800 MG PO TABS: 800.0000 mg | ORAL_TABLET | Freq: Three times a day (TID) | ORAL | Status: DC

## 2015-02-19 MED ORDER — KETOROLAC TROMETHAMINE 60 MG/2ML IM SOLN
60.0000 mg | Freq: Once | INTRAMUSCULAR | Status: AC
  Administered 2015-02-19: 60 mg via INTRAMUSCULAR

## 2015-02-19 NOTE — ED Provider Notes (Signed)
CSN: [id number]     Arrival date & time 02/19/15  0908 History   First MD Initiated Contact with Patient 02/19/15 [phone]     Chief Complaint  Patient presents with  . Flank Pain   (Consider location/radiation/quality/duration/timing/severity/associated sxs/prior Treatment) HPI [name] is a 49 year old woman here for evaluation of right flank pain. [name] states this started yesterday. It is located in the right flank and wraps around to the right side of her abdomen. [name] states [name] has had multiple kidney stones in the past, and this feels the same. [name] denies any dysuria. [name] has seen some blood in her urine. [name] also reports some mild suprapubic pressure with urination. No fevers or chills. [name] reports nausea. No vomiting. [name] is tolerating fluids well. No vaginal symptoms. [name] has had a complete hysterectomy. [name] tried Advil yesterday, without improvement.  Past Medical History  Diagnosis Date  . Migraine   . History of endometriosis   . Depression   . High cholesterol   . Incontinence   . Menopausal symptoms   . Allergy   . Anxiety   . Heart murmur     MITRAL VALVE PROLASP  . Chronic kidney disease     KIDNEYSTONES  . Thyroid disease   . Osteoporosis   . Cancer   . Skin cancer    Past Surgical History  Procedure Laterality Date  . Total abdominal hysterectomy w/ bilateral salpingoophorectomy    . Tvt  2007  . Appendectomy    . Breast enhancement surgery      SALINE  . Abdominal hysterectomy    . Breast surgery    . Bunionectomy      BILATERAL FEET  . Ankle surgery      RIGHT  . Wisdom tooth extraction      X 4   Family History  Problem Relation Age of Onset  . Diabetes Mother   . Heart disease Mother   . Hyperlipidemia Mother   . Tuberculosis Father   . Alcohol abuse Father   . Heart disease Sister   . Hyperlipidemia Sister   . Heart disease Brother   . Hyperlipidemia Brother   . Diabetes Maternal Grandmother   . Diabetes Maternal Grandfather    History   Substance Use Topics  . Smoking status: Former Research scientist (life sciences)  . Smokeless tobacco: Not on file  . Alcohol Use: Yes     Comment: OCCASIONALLY   OB History    Gravida Para Term Preterm AB TAB SAB Ectopic Multiple Living   2 2        2      Review of Systems  Constitutional: Positive for activity change and appetite change. Negative for fever and chills.  HENT: Negative.   Respiratory: Negative.   Cardiovascular: Negative.   Gastrointestinal: Positive for nausea and abdominal pain. Negative for vomiting, diarrhea and constipation.  Genitourinary: Positive for hematuria and flank pain. Negative for dysuria and vaginal discharge.    Allergies  Pseudoeph-doxylamine-dm-apap  Home Medications   Prior to Admission medications   Medication Sig Start Date End Date Taking? Authorizing Provider  ciprofloxacin (CIPRO) 500 MG tablet Take 1 tablet (500 mg total) by mouth 2 (two) times daily. 02/19/15   Melony Overly, MD  HYDROcodone-acetaminophen (NORCO/VICODIN) 5-325 MG per tablet Take 1 tablet by mouth every 4 (four) hours as needed for moderate pain. 02/19/15   Melony Overly, MD  ibuprofen (ADVIL,MOTRIN) 800 MG tablet Take 1 tablet (800 mg total) by mouth 3 (three) times  daily. 02/19/15   Melony Overly, MD  ondansetron (ZOFRAN-ODT) 4 MG disintegrating tablet Take 1 tablet (4 mg total) by mouth every 8 (eight) hours as needed for nausea or vomiting. 02/19/15   Melony Overly, MD  tamsulosin (FLOMAX) 0.4 MG CAPS capsule Take 1 capsule (0.4 mg total) by mouth daily. 02/19/15   Melony Overly, MD   BP 126/79 mmHg  Pulse 71  Temp(Src) 98 F (36.7 C)  Resp 16  SpO2 95% Physical Exam  Constitutional: [name] is oriented to person, place, and time. [name] appears well-developed and well-nourished. [name] appears distressed (looks uncomfortable, rocking slightly on exam table).  Neck: Neck supple.  Cardiovascular: Normal rate, regular rhythm and normal heart sounds.   No murmur heard. Pulmonary/Chest: Effort normal. No  respiratory distress. [name] has no wheezes. [name] has no rales.  Course sounds in bilateral bases  Abdominal: Soft. Bowel sounds are normal. [name] exhibits no distension. There is tenderness (right side). There is no rebound and no guarding.  Positive right CVA tenderness  Neurological: [name] is alert and oriented to person, place, and time.  Skin: Skin is warm and dry.    ED Course  Procedures (including critical care time) Labs Review Labs Reviewed  POCT URINALYSIS DIP (DEVICE) - Abnormal; Notable for the following:    Hgb urine dipstick MODERATE (*)    Nitrite POSITIVE (*)    Leukocytes, UA TRACE (*)    All other components within normal limits  URINE CULTURE    Imaging Review Dg Abd 1 View  02/19/2015   CLINICAL DATA:  Right-sided flank pain  EXAM: ABDOMEN - 1 VIEW  COMPARISON:  08/10/2014  FINDINGS: Scattered large and small bowel gas is noted. No renal or ureteral calculi are identified. Multiple phleboliths are seen within the pelvis. No bony abnormality is noted.  IMPRESSION: No acute abnormality seen.   Electronically Signed   By: Inez [name] M.D.   On: 02/19/2015 10:24     MDM   1. Right kidney stone   2. UTI (lower urinary tract infection)    Toradol 60 mg IM, Zofran 4 mg ODT, Norco 5-325 mg by mouth given.  Her pain is improved after the medications. Will treat for kidney stone with Flomax, ibuprofen, Zofran, Norco. UA is concerning for infection as well. Urine culture sent. Treat with Cipro for 7 days. Return precautions reviewed.  Melony Overly, MD 02/19/15 1038

## 2015-02-19 NOTE — ED Notes (Signed)
C/o pain in flak. Reported history of frequent stones

## 2015-02-19 NOTE — Discharge Instructions (Signed)
You likely have a kidney stone and a bladder infection. Take Flomax daily for the next 2 weeks or until you past the stone. Take Cipro twice a day for 1 week for infection. Take ibuprofen 800 mg 3 times a day for pain. Take Zofran every 8 hours as needed for nausea. Take Norco every 4-6 hours as needed for severe pain. Strain your urine. If you develop fevers, vomiting, or are unable to manage the pain at home, please go to [facility] emergency room.

## 2015-02-21 LAB — URINE CULTURE: Colony Count: >=100000

## 2015-02-23 NOTE — ED Notes (Signed)
Urine culture: >100,000 colonies E. Coli.  Pt. adequately treated with Cipro. 02/23/2015

## 2016-01-17 ENCOUNTER — Encounter (HOSPITAL_COMMUNITY): Payer: Self-pay | Admitting: Emergency Medicine

## 2016-01-17 ENCOUNTER — Emergency Department (HOSPITAL_COMMUNITY)
Admission: EM | Admit: 2016-01-17 | Discharge: 2016-01-17 | Disposition: A | Discharge status: Home / Self Care | Payer: BLUE CROSS/BLUE SHIELD | Attending: Emergency Medicine | Admitting: Emergency Medicine

## 2016-01-17 ENCOUNTER — Emergency Department (HOSPITAL_COMMUNITY): Payer: BLUE CROSS/BLUE SHIELD

## 2016-01-17 DIAGNOSIS — R011 Cardiac murmur, unspecified: Secondary | ICD-10-CM | POA: Diagnosis not present

## 2016-01-17 DIAGNOSIS — Z8639 Personal history of other endocrine, nutritional and metabolic disease: Secondary | ICD-10-CM | POA: Diagnosis not present

## 2016-01-17 DIAGNOSIS — N189 Chronic kidney disease, unspecified: Secondary | ICD-10-CM | POA: Diagnosis not present

## 2016-01-17 DIAGNOSIS — J159 Unspecified bacterial pneumonia: Secondary | ICD-10-CM | POA: Diagnosis not present

## 2016-01-17 DIAGNOSIS — Z8659 Personal history of other mental and behavioral disorders: Secondary | ICD-10-CM | POA: Insufficient documentation

## 2016-01-17 DIAGNOSIS — F17200 Nicotine dependence, unspecified, uncomplicated: Secondary | ICD-10-CM | POA: Diagnosis not present

## 2016-01-17 DIAGNOSIS — J189 Pneumonia, unspecified organism: Secondary | ICD-10-CM

## 2016-01-17 DIAGNOSIS — Z85828 Personal history of other malignant neoplasm of skin: Secondary | ICD-10-CM | POA: Diagnosis not present

## 2016-01-17 DIAGNOSIS — R6884 Jaw pain: Secondary | ICD-10-CM

## 2016-01-17 DIAGNOSIS — R0789 Other chest pain: Secondary | ICD-10-CM

## 2016-01-17 LAB — CBC
HCT: 44.5 % (ref 36.0–46.0)
Hemoglobin: 14.9 g/dL (ref 12.0–15.0)
MCH: 31 pg (ref 26.0–34.0)
MCHC: 33.5 g/dL (ref 30.0–36.0)
MCV: 92.5 fL (ref 78.0–100.0)
Platelets: 273 10*3/uL (ref 150–400)
RBC: 4.81 MIL/uL (ref 3.87–5.11)
RDW: 14.1 % (ref 11.5–15.5)
WBC: 11.2 10*3/uL — ABNORMAL HIGH (ref 4.0–10.5)

## 2016-01-17 LAB — BASIC METABOLIC PANEL
Anion gap: 12 (ref 5–15)
BUN: 9 mg/dL (ref 6–20)
CO2: 27 mmol/L (ref 22–32)
Calcium: 9.8 mg/dL (ref 8.9–10.3)
Chloride: 100 mmol/L — ABNORMAL LOW (ref 101–111)
Creatinine, Ser: 0.94 mg/dL (ref 0.44–1.00)
GFR calc Af Amer: >60 mL/min (ref >60)
GFR calc non Af Amer: >60 mL/min (ref >60)
Glucose, Bld: 71 mg/dL (ref 65–99)
Potassium: 4.6 mmol/L (ref 3.5–5.1)
Sodium: 139 mmol/L (ref 135–145)

## 2016-01-17 LAB — I-STAT TROPONIN, ED: Troponin i, poc: 0 ng/mL (ref 0.00–0.08)

## 2016-01-17 LAB — D-DIMER, QUANTITATIVE: D-Dimer, Quant: <0.27 ug/mL-FEU (ref 0.00–0.50)

## 2016-01-17 MED ORDER — SODIUM CHLORIDE 0.9 % IV BOLUS (SEPSIS)
1000.0000 mL | Freq: Once | INTRAVENOUS | Status: AC
  Administered 2016-01-17: 1000 mL via INTRAVENOUS

## 2016-01-17 MED ORDER — LEVOFLOXACIN 750 MG PO TABS: 750.0000 mg | ORAL_TABLET | Freq: Every day | ORAL | Status: DC

## 2016-01-17 MED ORDER — HYDROCODONE-ACETAMINOPHEN 5-325 MG PO TABS: 1.0000 | ORAL_TABLET | ORAL | Status: DC | PRN

## 2016-01-17 MED ORDER — IPRATROPIUM-ALBUTEROL 0.5-2.5 (3) MG/3ML IN SOLN
3.0000 mL | Freq: Once | RESPIRATORY_TRACT | Status: AC
  Administered 2016-01-17: 3 mL via RESPIRATORY_TRACT
  Filled 2016-01-17: qty 3

## 2016-01-17 MED ORDER — FENTANYL CITRATE (PF) 100 MCG/2ML IJ SOLN
50.0000 ug | Freq: Once | INTRAMUSCULAR | Status: AC
  Administered 2016-01-17: 50 ug via INTRAVENOUS
  Filled 2016-01-17: qty 2

## 2016-01-17 MED ORDER — KETOROLAC TROMETHAMINE 30 MG/ML IJ SOLN
30.0000 mg | Freq: Once | INTRAMUSCULAR | Status: AC
  Administered 2016-01-17: 30 mg via INTRAVENOUS
  Filled 2016-01-17: qty 1

## 2016-01-17 MED ORDER — ASPIRIN 81 MG PO CHEW
324.0000 mg | CHEWABLE_TABLET | Freq: Once | ORAL | Status: AC
  Administered 2016-01-17: 324 mg via ORAL
  Filled 2016-01-17: qty 4

## 2016-01-17 NOTE — ED Provider Notes (Signed)
CSN: [id number]     Arrival date & time 01/17/16  [id number] History   First MD Initiated Contact with Patient 01/17/16 1008     Chief Complaint  Patient presents with  . Chest Pain  . Jaw Pain     (Consider location/radiation/quality/duration/timing/severity/associated sxs/prior Treatment) HPI  50 year old female presents with a chief complaint of right jaw pain and chest pain. States the jaw pain started 2 days ago and is constant but seems to "lock up" whenever she tries to eat anything. She has severe pain and has to massage her jaw to get it unlocked. Denies any dental pain. Yesterday she had to take a leftover hydrocodone because of how severe the pain was. Patient also has chest pain. She has been having cough, shortness of breath and intermittent pain for about 6 weeks. She has been on 2 courses of antibiotics and prednisone by her PCP. Patient states that the cough has improved but now the chest pain is gone from intermittent to constant over the last 2 days. It is in the left side of her chest but also in her bilateral shoulder blades. Walking around while taking care of her children seems to worsen it and she has to sit down. No current fevers. No leg swelling or leg pain. Patient endorses a current history of smoking, hyperlipidemia that is untreated, and a family history of early coronary disease (mom at bedside had an MI in her 47s). Chest also feels tight.  Past Medical History  Diagnosis Date  . Migraine   . History of endometriosis   . Depression   . High cholesterol   . Incontinence   . Menopausal symptoms   . Allergy   . Anxiety   . Heart murmur     MITRAL VALVE PROLASP  . Chronic kidney disease     KIDNEYSTONES  . Thyroid disease   . Osteoporosis   . Cancer ([place])   . Skin cancer    Past Surgical History  Procedure Laterality Date  . Total abdominal hysterectomy w/ bilateral salpingoophorectomy    . Tvt  2007  . Appendectomy    . Breast enhancement surgery     SALINE  . Abdominal hysterectomy    . Breast surgery    . Bunionectomy      BILATERAL FEET  . Ankle surgery      RIGHT  . Wisdom tooth extraction      X 4   Family History  Problem Relation Age of Onset  . Diabetes Mother   . Heart disease Mother   . Hyperlipidemia Mother   . Tuberculosis Father   . Alcohol abuse Father   . Heart disease Sister   . Hyperlipidemia Sister   . Heart disease Brother   . Hyperlipidemia Brother   . Diabetes Maternal Grandmother   . Diabetes Maternal Grandfather    Social History  Substance Use Topics  . Smoking status: Current Some Day Smoker  . Smokeless tobacco: None  . Alcohol Use: Yes     Comment: OCCASIONALLY   OB History    Gravida Para Term Preterm AB TAB SAB Ectopic Multiple Living   2 2        2      Review of Systems  Constitutional: Negative for fever.  HENT: Negative for dental problem and facial swelling.   Respiratory: Positive for cough, chest tightness, shortness of breath and wheezing.   Cardiovascular: Positive for chest pain.  Gastrointestinal: Negative for abdominal pain.  Musculoskeletal: Positive  for back pain.  All other systems reviewed and are negative.     Allergies  Pseudoeph-doxylamine-dm-apap  Home Medications   Prior to Admission medications   Medication Sig Start Date End Date Taking? Authorizing Provider  ciprofloxacin (CIPRO) 500 MG tablet Take 1 tablet (500 mg total) by mouth 2 (two) times daily. 02/19/15   Melony Overly, MD  HYDROcodone-acetaminophen (NORCO/VICODIN) 5-325 MG per tablet Take 1 tablet by mouth every 4 (four) hours as needed for moderate pain. 02/19/15   Melony Overly, MD  ibuprofen (ADVIL,MOTRIN) 800 MG tablet Take 1 tablet (800 mg total) by mouth 3 (three) times daily. 02/19/15   Melony Overly, MD  ondansetron (ZOFRAN-ODT) 4 MG disintegrating tablet Take 1 tablet (4 mg total) by mouth every 8 (eight) hours as needed for nausea or vomiting. 02/19/15   Melony Overly, MD  tamsulosin (FLOMAX)  0.4 MG CAPS capsule Take 1 capsule (0.4 mg total) by mouth daily. 02/19/15   Melony Overly, MD   BP 117/69 mmHg  Pulse 77  Temp(Src) 99.1 F (37.3 C) (Oral)  Resp 23  Ht 5' (1.524 m)  Wt 129 lb 7 oz (58.712 kg)  BMI 25.28 kg/m2  SpO2 97% Physical Exam  Constitutional: She is oriented to person, place, and time. She appears well-developed and well-nourished.  HENT:  Head: Normocephalic and atraumatic.    Right Ear: Tympanic membrane and external ear normal.  Left Ear: Tympanic membrane and external ear normal.  Nose: Nose normal.  Poor overall dentition with multiple missing teeth and multiple discolored teeth. None are swollen or tender  Eyes: Right eye exhibits no discharge. Left eye exhibits no discharge.  Neck: Normal range of motion. Neck supple.  Cardiovascular: Normal rate, regular rhythm and normal heart sounds.   Pulmonary/Chest: Effort normal. She has wheezes (mild inspiratory wheezes at end of inspiration). She exhibits no tenderness.  Abdominal: Soft. There is no tenderness.  Musculoskeletal:       Thoracic back: She exhibits tenderness (mild).       Back:  Neurological: She is alert and oriented to person, place, and time.  Skin: Skin is warm and dry.  Nursing note and vitals reviewed.   ED Course  Procedures (including critical care time) Labs Review Labs Reviewed  BASIC METABOLIC PANEL - Abnormal; Notable for the following:    Chloride 100 (*)    All other components within normal limits  CBC - Abnormal; Notable for the following:    WBC 11.2 (*)    All other components within normal limits  D-DIMER, QUANTITATIVE (NOT AT [facility])  Randolm Idol, ED    Imaging Review Dg Orthopantogram  01/17/2016  CLINICAL DATA:  Right jaw pain intermittently for 3 weeks. No known injury. Initial encounter. EXAM: ORTHOPANTOGRAM/PANORAMIC COMPARISON:  None. FINDINGS: No acute bony or joint abnormality is identified. No periapical lucency is seen. The patient is missing  several teeth. IMPRESSION: No acute abnormality. Electronically Signed   By: Inge Rise M.D.   On: 01/17/2016 10:58   Dg Chest 2 View  01/17/2016  CLINICAL DATA:  Chest pain and cough EXAM: CHEST  2 VIEW COMPARISON:  08/10/2014 FINDINGS: Bilateral airspace disease has developed since prior study. Patchy airspace disease in the right middle lobe and lingula. This is most likely due to pneumonia. No effusion. Negative for heart failure. Heart size normal. IMPRESSION: Right middle lobe and lingular infiltrates consistent with pneumonia Electronically Signed   By: Franchot Gallo M.D.   On: 01/17/2016  10:46   I have personally reviewed and evaluated these images and lab results as part of my medical decision-making.   EKG Interpretation   Date/Time:  Monday January 17 2016 09:31:21 EDT Ventricular Rate:  79 PR Interval:  112 QRS Duration: 88 QT Interval:  354 QTC Calculation: 405 R Axis:   104 Text Interpretation:  Normal sinus rhythm Rightward axis T wave  abnormality, consider inferior ischemia Abnormal ECG No old tracing to  compare Confirmed by Glendola Friedhoff  MD, Petina Muraski ([id number]) on 01/17/2016 9:41:07 AM      MDM   Final diagnoses:  Community acquired pneumonia  Jaw pain  Atypical chest pain    Given continued cough, chest tightness/pain and dyspnea this is likely incompletely or untreated PNA. Mildly elevated WBC but no significant increased WOB, no hypoxia. Discussed obs vs outpatient treatment and patient wants to go home. Her CP is atypical and I doubt ACS. PE is less likely as well. She had a couple BPs in the high 90s but last BP over [id number] systolic and she does not feel or look ill. She does have some nonspecific T waves on ECG but I doubt this is acute. No old to compare. Patient does not want to be monitored or have a second troponin. Thus plan to treat with levaquin (was most recently on doxycycline). As for her jaw, maybe this is TMJ? She has no swelling or dental abscess that I can  see. Is not currently "locked up" and she freely moves it and talks. F/u with ENT.    Sherwood Gambler, MD 01/17/16 [phone]

## 2016-01-17 NOTE — ED Notes (Signed)
Pt c/o cough for 6 weeks and was treated for pneumonia. Pt reports left sided chest pain that radiates to shoulder with shortness of breath for couple weeks. Pt also reports right side of jaw locking up.

## 2016-02-01 ENCOUNTER — Ambulatory Visit
Admission: RE | Admit: 2016-02-01 | Discharge: 2016-02-01 | Disposition: A | Discharge status: Home / Self Care | Payer: BLUE CROSS/BLUE SHIELD | Source: Ambulatory Visit | Attending: Family Medicine | Admitting: Family Medicine

## 2016-02-01 ENCOUNTER — Other Ambulatory Visit: Payer: Self-pay | Admitting: Family Medicine

## 2016-02-01 DIAGNOSIS — R1032 Left lower quadrant pain: Secondary | ICD-10-CM

## 2016-02-15 ENCOUNTER — Encounter: Payer: Self-pay | Admitting: Cardiology

## 2016-12-18 ENCOUNTER — Encounter (HOSPITAL_COMMUNITY): Payer: Self-pay | Admitting: Emergency Medicine

## 2016-12-18 ENCOUNTER — Emergency Department (HOSPITAL_COMMUNITY): Payer: BLUE CROSS/BLUE SHIELD

## 2016-12-18 ENCOUNTER — Observation Stay (HOSPITAL_COMMUNITY)
Admission: EM | Admit: 2016-12-18 | Discharge: 2016-12-19 | Disposition: A | Discharge status: Home / Self Care | Payer: BLUE CROSS/BLUE SHIELD | Attending: Internal Medicine | Admitting: Internal Medicine

## 2016-12-18 DIAGNOSIS — E785 Hyperlipidemia, unspecified: Secondary | ICD-10-CM | POA: Insufficient documentation

## 2016-12-18 DIAGNOSIS — R918 Other nonspecific abnormal finding of lung field: Secondary | ICD-10-CM | POA: Insufficient documentation

## 2016-12-18 DIAGNOSIS — R079 Chest pain, unspecified: Principal | ICD-10-CM | POA: Insufficient documentation

## 2016-12-18 DIAGNOSIS — I341 Nonrheumatic mitral (valve) prolapse: Secondary | ICD-10-CM | POA: Insufficient documentation

## 2016-12-18 DIAGNOSIS — G43909 Migraine, unspecified, not intractable, without status migrainosus: Secondary | ICD-10-CM | POA: Insufficient documentation

## 2016-12-18 DIAGNOSIS — E7800 Pure hypercholesterolemia, unspecified: Secondary | ICD-10-CM | POA: Insufficient documentation

## 2016-12-18 DIAGNOSIS — Z8249 Family history of ischemic heart disease and other diseases of the circulatory system: Secondary | ICD-10-CM | POA: Insufficient documentation

## 2016-12-18 DIAGNOSIS — F17210 Nicotine dependence, cigarettes, uncomplicated: Secondary | ICD-10-CM | POA: Insufficient documentation

## 2016-12-18 DIAGNOSIS — Z7952 Long term (current) use of systemic steroids: Secondary | ICD-10-CM | POA: Insufficient documentation

## 2016-12-18 DIAGNOSIS — Z833 Family history of diabetes mellitus: Secondary | ICD-10-CM | POA: Insufficient documentation

## 2016-12-18 DIAGNOSIS — R0602 Shortness of breath: Secondary | ICD-10-CM | POA: Insufficient documentation

## 2016-12-18 DIAGNOSIS — F419 Anxiety disorder, unspecified: Secondary | ICD-10-CM | POA: Insufficient documentation

## 2016-12-18 DIAGNOSIS — N189 Chronic kidney disease, unspecified: Secondary | ICD-10-CM | POA: Insufficient documentation

## 2016-12-18 DIAGNOSIS — Z7982 Long term (current) use of aspirin: Secondary | ICD-10-CM | POA: Insufficient documentation

## 2016-12-18 DIAGNOSIS — E079 Disorder of thyroid, unspecified: Secondary | ICD-10-CM | POA: Insufficient documentation

## 2016-12-18 DIAGNOSIS — F329 Major depressive disorder, single episode, unspecified: Secondary | ICD-10-CM | POA: Insufficient documentation

## 2016-12-18 LAB — URINALYSIS, ROUTINE W REFLEX MICROSCOPIC
Bilirubin Urine: NEGATIVE
Glucose, UA: NEGATIVE mg/dL
Hgb urine dipstick: NEGATIVE
Ketones, ur: NEGATIVE mg/dL
Leukocytes, UA: NEGATIVE
Nitrite: NEGATIVE
Protein, ur: NEGATIVE mg/dL
Specific Gravity, Urine: 1.002 — ABNORMAL LOW (ref 1.005–1.030)
pH: 6 (ref 5.0–8.0)

## 2016-12-18 LAB — CBC WITH DIFFERENTIAL/PLATELET
Basophils Absolute: 0.1 10*3/uL (ref 0.0–0.1)
Basophils Relative: 1 %
Eosinophils Absolute: 0.2 10*3/uL (ref 0.0–0.7)
Eosinophils Relative: 3 %
HCT: 40.7 % (ref 36.0–46.0)
Hemoglobin: 13.7 g/dL (ref 12.0–15.0)
Lymphocytes Relative: 50 %
Lymphs Abs: 4.3 10*3/uL — ABNORMAL HIGH (ref 0.7–4.0)
MCH: 30.4 pg (ref 26.0–34.0)
MCHC: 33.7 g/dL (ref 30.0–36.0)
MCV: 90.2 fL (ref 78.0–100.0)
Monocytes Absolute: 0.3 10*3/uL (ref 0.1–1.0)
Monocytes Relative: 4 %
Neutro Abs: 3.5 10*3/uL (ref 1.7–7.7)
Neutrophils Relative %: 42 %
Platelets: 271 10*3/uL (ref 150–400)
RBC: 4.51 MIL/uL (ref 3.87–5.11)
RDW: 13.9 % (ref 11.5–15.5)
WBC: 8.5 10*3/uL (ref 4.0–10.5)

## 2016-12-18 LAB — COMPREHENSIVE METABOLIC PANEL
ALT: 22 U/L (ref 14–54)
AST: 30 U/L (ref 15–41)
Albumin: 4.2 g/dL (ref 3.5–5.0)
Alkaline Phosphatase: 60 U/L (ref 38–126)
Anion gap: 14 (ref 5–15)
BUN: 8 mg/dL (ref 6–20)
CO2: 24 mmol/L (ref 22–32)
Calcium: 9.4 mg/dL (ref 8.9–10.3)
Chloride: 105 mmol/L (ref 101–111)
Creatinine, Ser: 0.75 mg/dL (ref 0.44–1.00)
GFR calc Af Amer: >60 mL/min (ref >60)
GFR calc non Af Amer: >60 mL/min (ref >60)
Glucose, Bld: 89 mg/dL (ref 65–99)
Potassium: 3.6 mmol/L (ref 3.5–5.1)
Sodium: 143 mmol/L (ref 135–145)
Total Bilirubin: 0.4 mg/dL (ref 0.3–1.2)
Total Protein: 6.8 g/dL (ref 6.5–8.1)

## 2016-12-18 LAB — I-STAT TROPONIN, ED: Troponin i, poc: 0 ng/mL (ref 0.00–0.08)

## 2016-12-18 MED ORDER — ASPIRIN 81 MG PO CHEW
243.0000 mg | CHEWABLE_TABLET | Freq: Once | ORAL | Status: AC
  Administered 2016-12-19: 243 mg via ORAL
  Filled 2016-12-18: qty 3

## 2016-12-18 MED ORDER — ASPIRIN 81 MG PO CHEW: 324.0000 mg | CHEWABLE_TABLET | Freq: Once | ORAL | Status: DC

## 2016-12-18 NOTE — ED Notes (Signed)
Patient began acting very strange while RN was in room. As her husband begins talking about patient "acting out of her head" earlier today, pt states, "you've got 5 heads right now" and laughs. Pt denies dizziness or feeling like she's going to pass out, just states she has been fatigued more than normal.

## 2016-12-18 NOTE — ED Notes (Signed)
Patient now laughing and joking with family.  Pt fell asleep while in wheelchair for approx 10 minutes.  NAD noted.

## 2016-12-18 NOTE — ED Notes (Addendum)
Patient yelling at husband and mother that she wants to have a cigarette.  Husband looking to RN for reassurance.  This RN spoke to patient.  Patient adamant that she would like a cigarette.  This RN told her she could not tell her it was OK for her to go outside to have a cigarette, but asked patient if she decided she absolutely had to go to go outside in wheelchair.  Patient and family agreed.  Patient now back in from smoking and stating "my heart hurts". This RN stated "I think we should not take any more trips outside"

## 2016-12-18 NOTE — ED Triage Notes (Addendum)
Pt to ED with c/o left chest pain onset earlier tonight.  Pt's st's pain radiates from her chest into her back. Pt with unsteady gait when going to bathroom and dozing off during triage

## 2016-12-18 NOTE — ED Notes (Signed)
Patient states she has had intermittent CP accompanied by SOB "for a while." Pt states it's been going on a couple weeks. Pt's family states she has been under a lot of stress and was acting confused and was "off balance" earlier today. Pt is a smoker, denies any major medical conditions.

## 2016-12-18 NOTE — ED Provider Notes (Addendum)
[facility] DEPT Provider Note   CSN: [id number] Arrival date & time: 12/18/16  1926     History   Chief Complaint Chief Complaint  Patient presents with  . Chest Pain    HPI [name] is a 51 y.o. female.Complains of anterior chest pain rating to left arm pain does not go back onset approximately 2 weeks ago. Pain is worse with exertion and improved with rest she is presently asymptomatic pain lasts possibly 5 minutes at a time. No treatment prior to coming here presently asymptomatic. No other associated symptom  HPI  Past Medical History:  Diagnosis Date  . Allergy   . Anxiety   . Cancer ([place])   . Chronic kidney disease    KIDNEYSTONES  . Depression   . Heart murmur    MITRAL VALVE PROLASP  . High cholesterol   . History of endometriosis   . Incontinence   . Menopausal symptoms   . Migraine   . Osteoporosis   . Skin cancer   . Thyroid disease    Patient Active Problem List   Diagnosis Date Noted  . Depression 01/09/2012  . Menopause 01/09/2012  . SHOULDER PAIN, RIGHT 07/02/2008  . ACROMIOCLAVICULAR SPRAIN AND STRAIN 07/02/2008  . MIGRAINE HEADACHE 05/09/2007  . 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 ANKLE SURGERY     RIGHT  . APPENDECTOMY    . BREAST ENHANCEMENT SURGERY     SALINE  . BUNIONECTOMY     BILATERAL FEET  . TOTAL ABDOMINAL HYSTERECTOMY W/ BILATERAL SALPINGOOPHORECTOMY    . TVT  2007  . WISDOM TOOTH EXTRACTION     X 4    OB History    Gravida Para Term Preterm AB Living   2 2       2    SAB TAB Ectopic Multiple Live Births           2       Home Medications    Prior to Admission medications   Medication Sig Start Date End Date Taking? Authorizing Provider  doxycycline (VIBRAMYCIN) 100 MG capsule Take 100 mg by mouth 2 (two) times daily. For 10 days started on 01-08-16     Historical Provider, MD  HYDROcodone-acetaminophen (NORCO) 5-325 MG tablet Take 1 tablet by mouth every 4 (four) hours as needed. 01/17/16   Sherwood Gambler, MD  ibuprofen (ADVIL,MOTRIN) 800 MG tablet Take 1 tablet (800 mg total) by mouth 3 (three) times daily. 02/19/15   Melony Overly, MD  levofloxacin (LEVAQUIN) 750 MG tablet Take 1 tablet (750 mg total) by mouth daily. X 7 days 01/17/16   Sherwood Gambler, MD  predniSONE (DELTASONE) 20 MG tablet Take 20 mg by mouth See admin instructions. Finished on 01-14-16    Historical Provider, MD    Family History Family History  Problem Relation Age of Onset  . Diabetes Mother   . Heart disease Mother   . Hyperlipidemia Mother   . Tuberculosis Father   . Alcohol abuse Father   . Heart disease Sister   . Hyperlipidemia Sister   . Heart disease Brother   . Hyperlipidemia Brother   . Diabetes Maternal Grandmother   . Diabetes Maternal Grandfather    Other had MI age 58 Social History Social History  Substance Use Topics  . Smoking status: Current Every Day Smoker  . Smokeless tobacco:  Never Used  . Alcohol use Yes     Comment: OCCASIONALLY   Denies illicit drug use  Allergies   Pseudoeph-doxylamine-dm-apap   Review of Systems Review of Systems  Constitutional: Negative.   HENT: Negative.   Respiratory: Positive for shortness of breath.   Cardiovascular: Positive for chest pain.  Gastrointestinal: Negative.   Musculoskeletal: Negative.   Skin: Negative.   Neurological: Negative.   Psychiatric/Behavioral: Negative.   All other systems reviewed and are negative.    Physical Exam Updated Vital Signs BP 103/69   Pulse 63   Temp 97.9 F (36.6 C) (Oral)   Resp 15   Ht 5' (1.524 m)   Wt 120 lb (54.4 kg)   SpO2 99%   BMI 23.44 kg/m   Physical Exam  Constitutional: She appears well-developed and well-nourished.  HENT:  Head: Normocephalic and atraumatic.  Eyes: Conjunctivae are normal. Pupils are equal, round, and reactive to  light.  Neck: Neck supple. No tracheal deviation present. No thyromegaly present.  Cardiovascular: Normal rate and regular rhythm.   No murmur heard. Pulmonary/Chest: Effort normal and breath sounds normal.  Abdominal: Soft. Bowel sounds are normal. She exhibits no distension. There is no tenderness.  Musculoskeletal: Normal range of motion. She exhibits no edema or tenderness.  Neurological: She is alert. Coordination normal.  Skin: Skin is warm and dry. No rash noted.  Psychiatric: She has a normal mood and affect.  Nursing note and vitals reviewed.    ED Treatments / Results  Labs (all labs ordered are listed, but only abnormal results are displayed) Labs Reviewed  CBC WITH DIFFERENTIAL/PLATELET - Abnormal; Notable for the following:       Result Value   Lymphs Abs 4.3 (*)    All other components within normal limits  URINALYSIS, ROUTINE W REFLEX MICROSCOPIC - Abnormal; Notable for the following:    Color, Urine COLORLESS (*)    Specific Gravity, Urine 1.002 (*)    All other components within normal limits  COMPREHENSIVE METABOLIC PANEL  I-STAT TROPOININ, ED  I-STAT TROPOININ, ED    EKG  EKG Interpretation  Date/Time:  Monday December 18 2016 19:35:01 EST Ventricular Rate:  71 PR Interval:  130 QRS Duration: 88 QT Interval:  402 QTC Calculation: 436 R Axis:   105 Text Interpretation:  Normal sinus rhythm Rightward axis Borderline ECG No significant change since last tracing Confirmed by Winfred Leeds  MD, Dreyden Rohrman [phone]) on 12/18/2016 11:23:13 PM       Radiology Dg Chest 2 View  Result Date: 12/18/2016 CLINICAL DATA:  Left chest pain beginning tonight. Shortness of breath. EXAM: CHEST  2 VIEW COMPARISON:  PA and lateral chest 01/17/2016. CT chest 06/01/2011. Single-view of the chest 08/10/2014. FINDINGS: There is some peribronchial thickening which is unchanged. No consolidative process, pneumothorax or effusion. Heart size is normal. Breast implants are noted. IMPRESSION:  Chronic bronchitic change.  No acute process. Electronically Signed   By: Inge Rise M.D.   On: 12/18/2016 20:45    Procedures Procedures (including critical care time)  Medications Ordered in ED Medications  aspirin chewable tablet 324 mg (not administered)   Chest x-ray viewed by me Results for orders placed or performed during the hospital encounter of 12/18/16  CBC with Differential  Result Value Ref Range   WBC 8.5 4.0 - 10.5 K/uL   RBC 4.51 3.87 - 5.11 MIL/uL   Hemoglobin 13.7 12.0 - 15.0 g/dL   HCT 40.7 36.0 - 46.0 %   MCV 90.2 78.0 -  100.0 fL   MCH 30.4 26.0 - 34.0 pg   MCHC 33.7 30.0 - 36.0 g/dL   RDW 13.9 11.5 - 15.5 %   Platelets 271 150 - 400 K/uL   Neutrophils Relative % 42 %   Neutro Abs 3.5 1.7 - 7.7 K/uL   Lymphocytes Relative 50 %   Lymphs Abs 4.3 (H) 0.7 - 4.0 K/uL   Monocytes Relative 4 %   Monocytes Absolute 0.3 0.1 - 1.0 K/uL   Eosinophils Relative 3 %   Eosinophils Absolute 0.2 0.0 - 0.7 K/uL   Basophils Relative 1 %   Basophils Absolute 0.1 0.0 - 0.1 K/uL  Comprehensive metabolic panel  Result Value Ref Range   Sodium 143 135 - 145 mmol/L   Potassium 3.6 3.5 - 5.1 mmol/L   Chloride 105 101 - 111 mmol/L   CO2 24 22 - 32 mmol/L   Glucose, Bld 89 65 - 99 mg/dL   BUN 8 6 - 20 mg/dL   Creatinine, Ser 0.75 0.44 - 1.00 mg/dL   Calcium 9.4 8.9 - 10.3 mg/dL   Total Protein 6.8 6.5 - 8.1 g/dL   Albumin 4.2 3.5 - 5.0 g/dL   AST 30 15 - 41 U/L   ALT 22 14 - 54 U/L   Alkaline Phosphatase 60 38 - 126 U/L   Total Bilirubin 0.4 0.3 - 1.2 mg/dL   GFR calc non Af Amer >60 >60 mL/min   GFR calc Af Amer >60 >60 mL/min   Anion gap 14 5 - 15  Urinalysis, Routine w reflex microscopic  Result Value Ref Range   Color, Urine COLORLESS (A) YELLOW   APPearance CLEAR CLEAR   Specific Gravity, Urine 1.002 (L) 1.005 - 1.030   pH 6.0 5.0 - 8.0   Glucose, UA NEGATIVE NEGATIVE mg/dL   Hgb urine dipstick NEGATIVE NEGATIVE   Bilirubin Urine NEGATIVE NEGATIVE    Ketones, ur NEGATIVE NEGATIVE mg/dL   Protein, ur NEGATIVE NEGATIVE mg/dL   Nitrite NEGATIVE NEGATIVE   Leukocytes, UA NEGATIVE NEGATIVE  I-Stat Troponin, ED (not at [facility])  Result Value Ref Range   Troponin i, poc 0.00 0.00 - 0.08 ng/mL   Comment 3           Dg Chest 2 View  Result Date: 12/18/2016 CLINICAL DATA:  Left chest pain beginning tonight. Shortness of breath. EXAM: CHEST  2 VIEW COMPARISON:  PA and lateral chest 01/17/2016. CT chest 06/01/2011. Single-view of the chest 08/10/2014. FINDINGS: There is some peribronchial thickening which is unchanged. No consolidative process, pneumothorax or effusion. Heart size is normal. Breast implants are noted. IMPRESSION: Chronic bronchitic change.  No acute process. Electronically Signed   By: Inge Rise M.D.   On: 12/18/2016 20:45    Initial Impression / Assessment and Plan / ED Course  I have reviewed the triage vital signs and the nursing notes.  Pertinent labs & imaging results that were available during my care of the patient were reviewed by me and considered in my medical decision making (see chart for details).     Aspirin administered . Heart score equals 5.Concern for angina. Dr. Alcario Drought consulted. He will see patient in hospital. Plan telemetry, 23 observation.  Final Clinical Impressions(s) / ED Diagnoses  Diagnosis #1chest pain #2 tobacco  abuse Final diagnoses:  None    New Prescriptions New Prescriptions   No medications on file     Orlie Dakin, MD 12/19/16 [id number]    Orlie Dakin, MD 12/19/16 [id number]

## 2016-12-18 NOTE — ED Notes (Signed)
Pt's husband up to desk.  Short with RN.  Asking for pain medicine for patient.  This RN explained that additional pain meds over her home meds could not be offered at this time.  Patient's mother then up to desk, asking for a bed for pt to lay down.  Makeshift bed made on bench for patient with "pillow" and patient laying at this time.

## 2016-12-18 NOTE — ED Notes (Signed)
Patient attempted to walk around lobby to bathroom and food machines.  This Rn encouraged patient to be in wheelchair.  So did patient's family.  Patient escorted to bathroom with mother, refusing assistance from additional staff.  Patient complains of "my back hurts!" and holding her lower back.  Patient's family states she is argumentative and altered.

## 2016-12-19 ENCOUNTER — Encounter (HOSPITAL_COMMUNITY): Payer: Self-pay

## 2016-12-19 DIAGNOSIS — R079 Chest pain, unspecified: Secondary | ICD-10-CM | POA: Diagnosis present

## 2016-12-19 LAB — TROPONIN I
Troponin I: <0.03 ng/mL (ref <0.03)
Troponin I: <0.03 ng/mL (ref <0.03)
Troponin I: <0.03 ng/mL (ref <0.03)

## 2016-12-19 LAB — HIV ANTIBODY (ROUTINE TESTING W REFLEX): HIV Screen 4th Generation wRfx: NONREACTIVE

## 2016-12-19 LAB — I-STAT TROPONIN, ED: Troponin i, poc: 0 ng/mL (ref 0.00–0.08)

## 2016-12-19 MED ORDER — ATORVASTATIN CALCIUM 40 MG PO TABS
40.0000 mg | ORAL_TABLET | Freq: Every day | ORAL | Status: DC
Start: 2016-12-19 — End: 2016-12-19

## 2016-12-19 MED ORDER — ASPIRIN 81 MG PO CHEW
81.0000 mg | CHEWABLE_TABLET | Freq: Every day | ORAL | Status: DC
  Administered 2016-12-19: 81 mg via ORAL
  Filled 2016-12-19: qty 1

## 2016-12-19 MED ORDER — ONDANSETRON HCL 4 MG/2ML IJ SOLN: 4.0000 mg | Freq: Four times a day (QID) | INTRAMUSCULAR | Status: DC | PRN

## 2016-12-19 MED ORDER — ACETAMINOPHEN 325 MG PO TABS
650.0000 mg | ORAL_TABLET | ORAL | Status: DC | PRN
Start: 2016-12-19 — End: 2016-12-19
  Administered 2016-12-19: 650 mg via ORAL
  Filled 2016-12-19: qty 2

## 2016-12-19 MED ORDER — ENOXAPARIN SODIUM 40 MG/0.4ML ~~LOC~~ SOLN
40.0000 mg | SUBCUTANEOUS | Status: DC
  Filled 2016-12-19: qty 0.4

## 2016-12-19 NOTE — Discharge Summary (Signed)
Physician Discharge Summary  [name] [id number] DOB: September 19, 1966 DOA: 12/18/2016  PCP: Odette Fraction, MD  Admit date: 12/18/2016 Discharge date: 12/19/2016   Recommendations for Outpatient Follow-Up:   1. Stop smoking 2. outpatient stress test- refused inpatient   Discharge Diagnosis:   Active Problems:   Chest pain, rule out acute myocardial infarction   Discharge disposition:  Home.   Discharge Condition: Improved.  Diet recommendation: Low sodium, heart healthy  Wound care: None.   History of Present Illness:    [name] is a 51 y.o. female with medical history significant of HLD, family history of early CAD in patients mother (in her late 34s), mitral valve prolapse.  Patient presents to the ED with c/o chest pain.  Symptoms onset about 2 weeks ago.  Not really worse with exertion she tells me.  Symptoms are intermittent and pain lasts about 5 min at a time.  No treatment prior to coming to ED and presently CP free.  Pain is located in the L chest with radiation to L arm.   Hospital Course by Problem:   Chest pain  CE negative EKG Outpatient stress test    Medical Consultants:    None.   Discharge Exam:   Vitals:   12/19/16 0137 12/19/16 0624  BP: 106/68 102/62  Pulse: 63 67  Resp: 18 18  Temp: 98.3 F (36.8 C) 98.4 F (36.9 C)   Vitals:   12/19/16 0015 12/19/16 0030 12/19/16 0137 12/19/16 0624  BP:  107/76 106/68 102/62  Pulse: 68 75 63 67  Resp: 14 18 18 18   Temp:   98.3 F (36.8 C) 98.4 F (36.9 C)  TempSrc:   Oral Oral  SpO2: 97% 97% 100% 96%  Weight:   53.1 kg (117 lb)   Height:   5' (1.524 m)     Gen:  NAD    The results of significant diagnostics from this hospitalization (including imaging, microbiology, ancillary and laboratory) are listed below for reference.     Procedures and Diagnostic Studies:   Dg Chest 2 View  Result Date: 12/18/2016 CLINICAL DATA:  Left chest pain beginning tonight. Shortness  of breath. EXAM: CHEST  2 VIEW COMPARISON:  PA and lateral chest 01/17/2016. CT chest 06/01/2011. Single-view of the chest 08/10/2014. FINDINGS: There is some peribronchial thickening which is unchanged. No consolidative process, pneumothorax or effusion. Heart size is normal. Breast implants are noted. IMPRESSION: Chronic bronchitic change.  No acute process. Electronically Signed   By: Inge Rise M.D.   On: 12/18/2016 20:45     Labs:   Basic Metabolic Panel:  Recent Labs Lab 12/18/16 1951  NA 143  K 3.6  CL 105  CO2 24  GLUCOSE 89  BUN 8  CREATININE 0.75  CALCIUM 9.4   GFR Estimated Creatinine Clearance: 60.4 mL/min (by C-G formula based on SCr of 0.75 mg/dL). Liver Function Tests:  Recent Labs Lab 12/18/16 1951  AST 30  ALT 22  ALKPHOS 60  BILITOT 0.4  PROT 6.8  ALBUMIN 4.2   No results for input(s): LIPASE, AMYLASE in the last 168 hours. No results for input(s): AMMONIA in the last 168 hours. Coagulation profile No results for input(s): INR, PROTIME in the last 168 hours.  CBC:  Recent Labs Lab 12/18/16 1951  WBC 8.5  NEUTROABS 3.5  HGB 13.7  HCT 40.7  MCV 90.2  PLT 271   Cardiac Enzymes:  Recent Labs Lab 12/19/16 0052 12/19/16 0341  TROPONINI <0.03 <  0.03   BNP: Invalid input(s): POCBNP CBG: No results for input(s): GLUCAP in the last 168 hours. D-Dimer No results for input(s): DDIMER in the last 72 hours. Hgb A1c No results for input(s): HGBA1C in the last 72 hours. Lipid Profile No results for input(s): CHOL, HDL, LDLCALC, TRIG, CHOLHDL, LDLDIRECT in the last 72 hours. Thyroid function studies No results for input(s): TSH, T4TOTAL, T3FREE, THYROIDAB in the last 72 hours.  Invalid input(s): FREET3 Anemia work up No results for input(s): VITAMINB12, FOLATE, FERRITIN, TIBC, IRON, RETICCTPCT in the last 72 hours. Microbiology No results found for this or any previous visit (from the past 240 hour(s)).   Discharge Instructions:    Discharge Instructions    Diet - low sodium heart healthy    Complete by:  As directed    Discharge instructions    Complete by:  As directed    Stop smoking Outpatient stress test-- you should receive a call from [place]   Increase activity slowly    Complete by:  As directed      Allergies as of 12/19/2016      Reactions   Pseudoeph-doxylamine-dm-apap    REACTION: keeps awake, legs constantly moving NYQUIL      Medication List    STOP taking these medications   VITAMIN E PO     TAKE these medications   aspirin 81 MG chewable tablet Chew 81 mg by mouth daily.   atorvastatin 40 MG tablet Commonly known as:  LIPITOR Take 40 mg by mouth daily.   MELATONIN PO Take 1 tablet by mouth at bedtime as needed (sleep).         Time coordinating discharge: 20 min  Signed:  Ahlia Lemanski U Monay Houlton   [facility] 12/19/2016, 8:19 AM

## 2016-12-19 NOTE — Progress Notes (Signed)
Discharge instructions and follow up appts explained and provided to patient verbalized understanding. Patient left floor via walking accompanied by staff no c/o pain at discharge.  Yaritzel Stange, Tivis Ringer, RN

## 2016-12-19 NOTE — H&P (Signed)
History and Physical    [name] [id number] DOB: [birth year] DOA: 12/18/2016   PCP: Odette Fraction, MD Chief Complaint:  Chief Complaint  Patient presents with  . Chest Pain    HPI: [name] is a 52 y.o. female with medical history significant of HLD, family history of early CAD in patients mother (in her late 37s), mitral valve prolapse.  Patient presents to the ED with c/o chest pain.  Symptoms onset about 2 weeks ago.  Not really worse with exertion she tells me.  Symptoms are intermittent and pain lasts about 5 min at a time.  No treatment prior to coming to ED and presently CP free.  Pain is located in the L chest with radiation to L arm.  ED Course: Trop neg x2. HEART score of 4.  Review of Systems: As per HPI otherwise 10 point review of systems negative.    Past Medical History:  Diagnosis Date  . Allergy   . Anxiety   . Cancer ([place])   . Chronic kidney disease    KIDNEYSTONES  . Depression   . Heart murmur    MITRAL VALVE PROLASP  . High cholesterol   . History of endometriosis   . Incontinence   . Menopausal symptoms   . Migraine   . Osteoporosis   . Skin cancer   . Thyroid disease     Past Surgical History:  Procedure Laterality Date  . ANKLE SURGERY     RIGHT  . APPENDECTOMY    . BREAST ENHANCEMENT SURGERY     SALINE  . BUNIONECTOMY     BILATERAL FEET  . TOTAL ABDOMINAL HYSTERECTOMY W/ BILATERAL SALPINGOOPHORECTOMY    . TVT  2007  . WISDOM TOOTH EXTRACTION     X 4     reports that she has been smoking.  She has never used smokeless tobacco. She reports that she drinks alcohol. She reports that she does not use drugs.  Allergies  Allergen Reactions  . Pseudoeph-Doxylamine-Dm-Apap     REACTION: keeps awake, legs constantly moving  [place]    Family History  Problem Relation Age of Onset  . Diabetes Mother   . Heart disease Mother   . Hyperlipidemia Mother   . Tuberculosis Father   . Alcohol abuse Father   . Heart disease  Sister   . Hyperlipidemia Sister   . Heart disease Brother   . Hyperlipidemia Brother   . Diabetes Maternal Grandmother   . Diabetes Maternal Grandfather       Prior to Admission medications   Medication Sig Start Date End Date Taking? Authorizing Provider  aspirin 81 MG chewable tablet Chew 81 mg by mouth daily.   Yes Historical Provider, MD  atorvastatin (LIPITOR) 40 MG tablet Take 40 mg by mouth daily.   Yes Historical Provider, MD  MELATONIN PO Take 1 tablet by mouth at bedtime as needed (sleep).   Yes Historical Provider, MD  VITAMIN E PO Take 1 tablet by mouth daily.   Yes Historical Provider, MD    Physical Exam: Vitals:   12/18/16 2330 12/18/16 2345 12/19/16 0015 12/19/16 0030  BP: 111/73   107/76  Pulse: 73 78 68 75  Resp: 12 22 14 18   Temp:      TempSrc:      SpO2: 98% 98% 97% 97%  Weight:      Height:          Constitutional: NAD, calm, comfortable Eyes: PERRL, lids and conjunctivae normal  ENMT: Mucous membranes are moist. Posterior pharynx clear of any exudate or lesions.Normal dentition.  Neck: normal, supple, no masses, no thyromegaly Respiratory: clear to auscultation bilaterally, no wheezing, no crackles. Normal respiratory effort. No accessory muscle use.  Cardiovascular: Regular rate and rhythm, no murmurs / rubs / gallops. No extremity edema. 2+ pedal pulses. No carotid bruits.  Abdomen: no tenderness, no masses palpated. No hepatosplenomegaly. Bowel sounds positive.  Musculoskeletal: no clubbing / cyanosis. No joint deformity upper and lower extremities. Good ROM, no contractures. Normal muscle tone.  Skin: no rashes, lesions, ulcers. No induration Neurologic: CN 2-12 grossly intact. Sensation intact, DTR normal. Strength 5/5 in all 4.  Psychiatric: Normal judgment and insight. Alert and oriented x 3. Normal mood.    Labs on Admission: I have personally reviewed following labs and imaging studies  CBC:  Recent Labs Lab 12/18/16 1951  WBC 8.5    NEUTROABS 3.5  HGB 13.7  HCT 40.7  MCV 90.2  PLT 99991111   Basic Metabolic Panel:  Recent Labs Lab 12/18/16 1951  NA 143  K 3.6  CL 105  CO2 24  GLUCOSE 89  BUN 8  CREATININE 0.75  CALCIUM 9.4   GFR: Estimated Creatinine Clearance: 60.4 mL/min (by C-G formula based on SCr of 0.75 mg/dL). Liver Function Tests:  Recent Labs Lab 12/18/16 1951  AST 30  ALT 22  ALKPHOS 60  BILITOT 0.4  PROT 6.8  ALBUMIN 4.2   No results for input(s): LIPASE, AMYLASE in the last 168 hours. No results for input(s): AMMONIA in the last 168 hours. Coagulation Profile: No results for input(s): INR, PROTIME in the last 168 hours. Cardiac Enzymes: No results for input(s): CKTOTAL, CKMB, CKMBINDEX, TROPONINI in the last 168 hours. BNP (last 3 results) No results for input(s): PROBNP in the last 8760 hours. HbA1C: No results for input(s): HGBA1C in the last 72 hours. CBG: No results for input(s): GLUCAP in the last 168 hours. Lipid Profile: No results for input(s): CHOL, HDL, LDLCALC, TRIG, CHOLHDL, LDLDIRECT in the last 72 hours. Thyroid Function Tests: No results for input(s): TSH, T4TOTAL, FREET4, T3FREE, THYROIDAB in the last 72 hours. Anemia Panel: No results for input(s): VITAMINB12, FOLATE, FERRITIN, TIBC, IRON, RETICCTPCT in the last 72 hours. Urine analysis:    Component Value Date/Time   COLORURINE COLORLESS (A) 12/18/2016 1954   APPEARANCEUR CLEAR 12/18/2016 1954   LABSPEC 1.002 (L) 12/18/2016 1954   PHURINE 6.0 12/18/2016 [place] NEGATIVE 12/18/2016 1954   HGBUR NEGATIVE 12/18/2016 1954   BILIRUBINUR NEGATIVE 12/18/2016 1954   KETONESUR NEGATIVE 12/18/2016 1954   PROTEINUR NEGATIVE 12/18/2016 1954   UROBILINOGEN 0.2 02/19/2015 1002   NITRITE NEGATIVE 12/18/2016 1954   LEUKOCYTESUR NEGATIVE 12/18/2016 1954   Sepsis Labs: @LABRCNTIP (procalcitonin:4,lacticidven:4) )No results found for this or any previous visit (from the past 240 hour(s)).   Radiological Exams  on Admission: Dg Chest 2 View  Result Date: 12/18/2016 CLINICAL DATA:  Left chest pain beginning tonight. Shortness of breath. EXAM: CHEST  2 VIEW COMPARISON:  PA and lateral chest 01/17/2016. CT chest 06/01/2011. Single-view of the chest 08/10/2014. FINDINGS: There is some peribronchial thickening which is unchanged. No consolidative process, pneumothorax or effusion. Heart size is normal. Breast implants are noted. IMPRESSION: Chronic bronchitic change.  No acute process. Electronically Signed   By: Inge Rise M.D.   On: 12/18/2016 20:45    EKG: Independently reviewed.  Assessment/Plan Active Problems:   Chest pain, rule out acute myocardial infarction  1. Chest pain ruleout - 1. CP obs pathway 2. Serial trops 3. NPO after midnight 4. Tele monitor 5. Call cards in AM re: need for stress test   DVT prophylaxis: Lovenox Code Status: Full Family Communication: No family in room Consults called: None Admission status: Admit to obs   Etta Quill DO [facility] Pager [phone] from 7PM-7AM  If 7AM-7PM, please contact the day physician for the patient [email or web address] Password [email or web address]  12/19/2016, 12:50 AM

## 2016-12-27 ENCOUNTER — Ambulatory Visit: Payer: BLUE CROSS/BLUE SHIELD | Admitting: Physician Assistant

## 2018-07-21 NOTE — Progress Notes (Signed)
Subjective:    Patient ID: [name], female    DOB: Oct 18, 1966, 53 y.o.   MRN: [id number]  [name] is a 52 year old female presenting to establish care. She has not been seen by her previous primary provider since 2014 and is not taking any medications currently, mostly due to cost.   HPI: She presents with several complaints, but her migraines, skull dent, and bilateral distal paresthesias being the most concerning to her today.   Migraine with aura: Reports history of migraines for at least the past 30 years, feels they have been increasing in frequency of the past few months. Bilateral frontal and throbbing in quality, with flashing white light aura prior to onset. Last generally 1-2 days, having a HA every few days currently. Associated with photophobia, phonophobia, and nausea. She can still do her ADL's during this time, however states "but not well." She previously took Excedrin HA, however was told to stop "for her heart" and Imitrex injections with relief of her migraines. No recent head injury or trauma, however is concerned that her decaying teeth may be contributing to increase of headaches. Denies any blurry vision, tinnitus, or syncope.     Bilateral paresthesias: Pt reports intermittent bilateral distal hand and feet paresthesias for quite some time now that has not further progressed since onset. She always wakes up with a burning sensation in her hands and feet, but then will slowly go away and comes on intermittently through the day several times. Can not identify any other eliciting factors, no association with certain movements, positions, or timing of migraines. Sometimes rubbing her hands together will make it better. The sensation is always bilateral, but may be only feet or her hands at a given time. Previously was taking B12 shots, has not taken in several years. Does have a history of hypothyroidism, currently untreated. No personal history of diabetes, however  complains of polydipsia today. Mother and few other family members have diabetes, mother has same symptoms. Has not been sexually active since at least early 2018.   Skull indentation: Right anterior parietal region. Noticed around January while randomly palpating her scalp, feels this area has increased since detected it. Denies head injury or any trauma to region. Has pain with palpation intermittently, denies any erythema, swelling, or drainage from area.     PMH per pt report: Anixety, arthritis, chronic pain, hyperlipidemia, frequent renal stones, migraines, hypothyroidism with nodules    Smoking status reviewed, 1PPD, patient considering quitting however not ready to fully commit to this.   Review of Systems Per HPI, also denies recent illness, fever, changes in vision, chest pain, shortness of breath, abdominal pain, N/V/D. (+) cough    Patient Active Problem List   Diagnosis Date Noted  . Paresthesia 07/22/2018  . 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 Hyperlipidemia 07/22/2018  . Health care maintenance 07/22/2018  . Depression 01/09/2012  . Menopause 01/09/2012  . SHOULDER PAIN, RIGHT 07/02/2008  . Migraine with aura 05/09/2007  . MITRAL VALVE PROLAPSE, MILD M. R.-SBE PROPHYL 05/09/2007     Objective:  BP 110/62   Pulse 71   Temp 98.7 F (37.1 C) (Oral)   Wt 110 lb 6.4 oz (50.1 kg)   SpO2 99%   BMI 21.56 kg/m  Vitals and nursing note reviewed  General: NAD, pleasant Mouth: black discoloration of R upper posterior molar, crater formation of decaying L upper posterior molar. No surrounding gum erythema.  Cardiac: RRR, normal heart sounds, 2+ systolic murmur  Respiratory: CTAB, normal effort Abdomen: soft, nontender, nondistended Extremities: no edema or cyanosis. WWP. Skin: warm and dry, no rashes noted Neuro: alert and oriented, no focal deficits MSK:   BUE: no deformities or bruising noted on bilateral arms, non-tender to palpation. Full ROM, 5/5 strength in wrist and  elbow joint. Tinels negative at bilateral wrist and elbow. Phalens test elicits increased tingling sensation in all 4 fingertips, no change in sensation in thumbs. Sensation to light touch intact. 2/4 b/l bicep reflexes  BLE: No deformities or bruising noted, NTP. Full ROM and 5/5 strength in ankle and knee joints. 2/4 patellar reflexes. Sensation to light touch and proprioception intact in bilateral feet with exception of proprioception of L great toe in region of previous surgery.  Psych: normal affect Derm: Few mm indentation, approx 2 cm in length running a/p in right anterior parietal region of skull with normal appearing scalp below. No erythema, drainage. Mild tenderness with palpation.   Assessment & Plan:    Migraine with aura Previously did well with sumatriptan injections, however given expense without insurance, will not pursue this at this time. Discussed out of pocket cost with patient, she agrees on waiting until she has insurance, applying for medicaid soon.  -Advised she may use her Excedrin HA or ibuprofen OTC as needed for HA, with ice or heating pad to assist  -Will consider discussing TMJ OMM/occipital release in future appointments  -Provided patient a list of dentists in the area for tooth extraction, may benefit HA frequency     Skull deformity Minor, chronic. Question if present long-term or actually since first detected in 10/2017 per pt. Unknown etiology, no history of trauma. Will continue to monitor, advised return precautions if rapid progression or signs of infection noted.   Paresthesia Chronic history of bilateral paresthesias/burning in bilateral hands and feet. Will check RPR, HIV negative in 11/2016 with no further sexual encounters since this time per pt. Will also obtain TSH and B12, especially given history of hypothyroidism (not currently on medication), and history reported of previously taking B12 injections. Lastly, will also check Hgb A1c, given  presentation and pt also reporting polydipsia in setting of diabetic family history.  -F/u RPR, Hgb A1c, TSH, B12   Hyperlipidemia Previously on atorvastatin, has not taken in several years. Last LDL 234 in 2012.  -Obtain lipid panel, [facility] maintenance -[facility]ided [facility] for patient, to assess pricing without insurance  -Recommended she receive her flu shot at her local pharmacy given this would be lower cost for her  -Had a complete hysterectomy in 2006 with removal of cervix per pt, however cervix not mention in surgical pathology notes within epic. Would like to perform a pelvic exam in the future to assess if cervix present or not for further papsmear recommendations  -Will discuss colonoscopy and pneumococcal vaccine at later appointments, perhaps following pt hopefully obtaining insurance   F/u in 2-3 weeks for further discussion of presenting concerns (chronic pain etc), and progression of today's complaints, and pelvic exam if able.    Darrelyn Hillock, DO Family Medicine Resident PGY-1

## 2018-07-22 ENCOUNTER — Ambulatory Visit (INDEPENDENT_AMBULATORY_CARE_PROVIDER_SITE_OTHER): Payer: Self-pay | Admitting: Family Medicine

## 2018-07-22 ENCOUNTER — Encounter: Payer: Self-pay | Admitting: Family Medicine

## 2018-07-22 ENCOUNTER — Other Ambulatory Visit: Payer: Self-pay

## 2018-07-22 VITALS — BP 110/62 | HR 71 | Temp 98.7°F | Wt 110.4 lb

## 2018-07-22 DIAGNOSIS — M952 Other acquired deformity of head: Secondary | ICD-10-CM

## 2018-07-22 DIAGNOSIS — Z1231 Encounter for screening mammogram for malignant neoplasm of breast: Secondary | ICD-10-CM

## 2018-07-22 DIAGNOSIS — E785 Hyperlipidemia, unspecified: Secondary | ICD-10-CM | POA: Insufficient documentation

## 2018-07-22 DIAGNOSIS — Z1239 Encounter for other screening for malignant neoplasm of breast: Secondary | ICD-10-CM

## 2018-07-22 DIAGNOSIS — R202 Paresthesia of skin: Secondary | ICD-10-CM

## 2018-07-22 DIAGNOSIS — G43109 Migraine with aura, not intractable, without status migrainosus: Secondary | ICD-10-CM

## 2018-07-22 DIAGNOSIS — Z0000 Encounter for general adult medical examination without abnormal findings: Secondary | ICD-10-CM

## 2018-07-22 NOTE — Assessment & Plan Note (Addendum)
Minor, chronic. Question if present long-term or actually since first detected in 10/2017 per pt. Unknown etiology, no history of trauma. Will continue to monitor, advised return precautions if rapid progression or signs of infection noted.

## 2018-07-22 NOTE — Assessment & Plan Note (Signed)
Chronic history of bilateral paresthesias/burning in bilateral hands and feet. Will check RPR, HIV negative in 11/2016 with no further sexual encounters since this time per pt. Will also obtain TSH and B12, especially given history of hypothyroidism (not currently on medication), and history reported of previously taking B12 injections. Lastly, will also check Hgb A1c, given presentation and pt also reporting polydipsia in setting of diabetic family history.  -F/u RPR, Hgb A1c, TSH, B12

## 2018-07-22 NOTE — Assessment & Plan Note (Signed)
-  Provided [facility] slip for patient, to assess pricing without insurance  -Recommended she receive her flu shot at her local pharmacy given this would be lower cost for her  -Had a complete hysterectomy in 2006 with removal of cervix per pt, however cervix not mention in surgical pathology notes within epic. Would like to perform a pelvic exam in the future to assess if cervix present or not for further papsmear recommendations  -Will discuss colonoscopy and pneumococcal vaccine at later appointments, perhaps following pt hopefully obtaining insurance

## 2018-07-22 NOTE — Assessment & Plan Note (Signed)
Previously on atorvastatin, has not taken in several years. Last LDL 234 in 2012.  -Obtain lipid panel, CMP

## 2018-07-22 NOTE — Assessment & Plan Note (Signed)
Previously did well with sumatriptan injections, however given expense without insurance, will not pursue this at this time. Discussed out of pocket cost with patient, she agrees on waiting until she has insurance, applying for medicaid soon.  -Advised she may use her Excedrin HA or ibuprofen OTC as needed for HA, with ice or heating pad to assist  -Will consider discussing TMJ OMM/occipital release in future appointments  -Provided patient a list of dentists in the area for tooth extraction, may benefit HA frequency

## 2018-07-22 NOTE — Patient Instructions (Signed)
It was so nice to meet you!   For your migraines, you may use ibuprofen 400mg  or Excedrin migraine every 6 hours as needed. You may also try a heating or ice pad on your head for relief. We will revisit sumatriptan injections in the future with insurance.   We also obtained labs today to check for causes of the burning in your hands and feet. I will let you know these results soon.   I have provided you a list of dentist that should be able to do sliding scale so you can have your teeth evaluated.   Please see your pharmacy for receiving a flu shot and visiting the breast cancer for your screening mammogram!   Follow up in the next 2-3 weeks. Have a wonderful day!

## 2018-07-23 LAB — COMPREHENSIVE METABOLIC PANEL
ALT: 14 IU/L (ref 0–32)
AST: 22 IU/L (ref 0–40)
Albumin/Globulin Ratio: 2.3 — ABNORMAL HIGH (ref 1.2–2.2)
Albumin: 4.9 g/dL (ref 3.5–5.5)
Alkaline Phosphatase: 77 IU/L (ref 39–117)
BUN/Creatinine Ratio: 14 (ref 9–23)
BUN: 13 mg/dL (ref 6–24)
Bilirubin Total: 0.5 mg/dL (ref 0.0–1.2)
CO2: 24 mmol/L (ref 20–29)
Calcium: 9.7 mg/dL (ref 8.7–10.2)
Chloride: 99 mmol/L (ref 96–106)
Creatinine, Ser: 0.94 mg/dL (ref 0.57–1.00)
GFR calc Af Amer: 81 mL/min/{1.73_m2} (ref >59)
GFR calc non Af Amer: 70 mL/min/{1.73_m2} (ref >59)
Globulin, Total: 2.1 g/dL (ref 1.5–4.5)
Glucose: 89 mg/dL (ref 65–99)
Potassium: 4 mmol/L (ref 3.5–5.2)
Sodium: 137 mmol/L (ref 134–144)
Total Protein: 7 g/dL (ref 6.0–8.5)

## 2018-07-23 LAB — HEMOGLOBIN A1C
Est. average glucose Bld gHb Est-mCnc: 108 mg/dL
Hgb A1c MFr Bld: 5.4 % (ref 4.8–5.6)

## 2018-07-23 LAB — LIPID PANEL
Chol/HDL Ratio: 5.6 ratio — ABNORMAL HIGH (ref 0.0–4.4)
Cholesterol, Total: 319 mg/dL — ABNORMAL HIGH (ref 100–199)
HDL: 57 mg/dL (ref >39)
LDL Calculated: 243 mg/dL — ABNORMAL HIGH (ref 0–99)
Triglycerides: 94 mg/dL (ref 0–149)
VLDL Cholesterol Cal: 19 mg/dL (ref 5–40)

## 2018-07-23 LAB — VITAMIN B12: Vitamin B-12: 629 pg/mL (ref 232–1245)

## 2018-07-23 LAB — RPR: RPR Ser Ql: NONREACTIVE

## 2018-07-23 LAB — TSH: TSH: 2.5 u[IU]/mL (ref 0.450–4.500)

## 2018-07-24 ENCOUNTER — Encounter: Payer: Self-pay | Admitting: Family Medicine

## 2018-08-28 ENCOUNTER — Ambulatory Visit: Payer: Self-pay | Admitting: Family Medicine

## 2018-10-04 ENCOUNTER — Ambulatory Visit: Payer: Self-pay | Admitting: Family Medicine

## 2018-11-05 ENCOUNTER — Encounter: Payer: Self-pay | Admitting: Family Medicine

## 2018-11-05 ENCOUNTER — Other Ambulatory Visit: Payer: Self-pay

## 2018-11-05 ENCOUNTER — Ambulatory Visit (INDEPENDENT_AMBULATORY_CARE_PROVIDER_SITE_OTHER): Payer: Self-pay | Admitting: Family Medicine

## 2018-11-05 VITALS — BP 119/80 | HR 67 | Temp 98.5°F | Wt 103.0 lb

## 2018-11-05 DIAGNOSIS — M952 Other acquired deformity of head: Secondary | ICD-10-CM

## 2018-11-05 DIAGNOSIS — R51 Headache: Secondary | ICD-10-CM

## 2018-11-05 DIAGNOSIS — R059 Cough, unspecified: Secondary | ICD-10-CM

## 2018-11-05 DIAGNOSIS — F17200 Nicotine dependence, unspecified, uncomplicated: Secondary | ICD-10-CM | POA: Insufficient documentation

## 2018-11-05 DIAGNOSIS — R05 Cough: Secondary | ICD-10-CM

## 2018-11-05 DIAGNOSIS — F17210 Nicotine dependence, cigarettes, uncomplicated: Secondary | ICD-10-CM

## 2018-11-05 DIAGNOSIS — R519 Headache, unspecified: Secondary | ICD-10-CM

## 2018-11-05 DIAGNOSIS — G43109 Migraine with aura, not intractable, without status migrainosus: Secondary | ICD-10-CM

## 2018-11-05 DIAGNOSIS — E785 Hyperlipidemia, unspecified: Secondary | ICD-10-CM

## 2018-11-05 DIAGNOSIS — G8929 Other chronic pain: Secondary | ICD-10-CM

## 2018-11-05 DIAGNOSIS — R634 Abnormal weight loss: Secondary | ICD-10-CM

## 2018-11-05 HISTORY — DX: Nicotine dependence, cigarettes, uncomplicated: F17.210

## 2018-11-05 MED ORDER — ALBUTEROL SULFATE HFA 108 (90 BASE) MCG/ACT IN AERS: 2.0000 | INHALATION_SPRAY | Freq: Four times a day (QID) | RESPIRATORY_TRACT | 2 refills | Status: DC | PRN

## 2018-11-05 MED ORDER — BUPROPION HCL ER (SR) 150 MG PO TB12: 150.0000 mg | ORAL_TABLET | Freq: Two times a day (BID) | ORAL | 1 refills | Status: DC

## 2018-11-05 NOTE — Assessment & Plan Note (Signed)
LDL 243 in 06/2018.  Not currently on statin therapy, will restart on Crestor at follow-up visit if patient amenable.

## 2018-11-05 NOTE — Assessment & Plan Note (Addendum)
Currently smokes 1 PPD, interested in quitting.  Previously had success with Wellbutrin, completely quit at that time.  Will restart Wellbutrin for smoking cessation, and may have additional added benefit to help with depression-like symptoms. - Prescribed Wellbutrin 150 mg, increase to twice daily in the next few days - Continued family support - Patient to notify if she needs additional cessation materials - Prescribed albuterol inhaler for wheezing/SOB with likely underlying COPD

## 2018-11-05 NOTE — Progress Notes (Signed)
Subjective:    Patient ID: [name], female    DOB: February 24, 1966, 53 y.o.   MRN: [id number]  HPI: [name] is a 53 year old female that presented to discuss multiple complaints as below:  Weight loss: 103 pounds today in the office, lowest weight at home 98 over Christmas. January 2019 she states her weight was around 130.  She has not been intentionally trying to lose weight, however has had a decrease in appetite for the past several months.  She enjoys eating avocado, pastas, and spicy foods and continues to have an appetite for those.  Eating maybe once or twice a day, not following a regular eating schedule.  She also notes associated low-grade fevers, fatigue, significant night sweats, and easy bruising over the past few months associated with her decrease in appetite.  States she has often checked her temperature, ranging 99- 100.1.  She endorses a chronic cough, smoking history, sleeping changes, difficulty concentrating.  However, denies any nausea, vomiting, change in bowel movements, hematochezia/melena, feeling depressed, bone pains, or palpation of any lumps.  Has never had a mammogram or colonoscopy due to cost.  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  She is not sure what it was, but had to have it surgically removed off of her thigh.  Skull indentation: Patient believes this is been present since approximately early 2019.  Decided to monitor this at her last visit in September 2019, however patient reports the region has sunken in more.  Patient and family in room are requesting further imaging of the area.  They are also highly concerned to make a major contributing to frequent headaches that she has been experiencing.  Denies any trauma to the area, swelling, skin infection, or itching.  Region is tender when she presses on it.  Headaches/migraines with aura: Continues to have daily headaches, unchanged in quality from visit in 06/2018.  Has  only been taking Tylenol at home, minimal relief.  Does not have insurance at this time to afford sumatriptan injections that had previously been beneficial for her.  She has also not been able to see a dentist for tooth extraction due to cost.  Smoking status reviewed: 1 PPD, interested in cutting back, previously had success with Wellbutrin.  Review of Systems Per HPI, also denies recent illness, fever, headache, changes in vision, chest pain, shortness of breath, abdominal pain, N/V/D, weakness   Patient Active Problem List   Diagnosis Date Noted  . Recent unintentional weight loss over several months 11/05/2018  . Cigarette smoker 11/05/2018  . Paresthesia 07/22/2018  . 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 Hyperlipidemia 07/22/2018  . Health care maintenance 07/22/2018  . Depression 01/09/2012  . Menopause 01/09/2012  . Migraine with aura 05/09/2007  . MITRAL VALVE PROLAPSE, MILD M. R.-SBE PROPHYL 05/09/2007     Objective:  BP 119/80   Pulse 67   Temp 98.5 F (36.9 C) (Oral)   Wt 103 lb (46.7 kg)   SpO2 97%   BMI 20.12 kg/m  Vitals and nursing note reviewed  General: NAD, pleasant HEENT: Oropharynx nonerythematous, no posterior/anterior cervical or supra-clavicular lymphadenopathy. 1/2 cm indentation approximately 2 cm in length running A/P in the right anterior parietal region of skull with normal-appearing scalp above.  Poor dentition. Cardiac: RRR, normal heart sounds  Respiratory: Dry cough intermittently during evaluation, scattered wheezing and rhonchi present in all lung fields, NO increased work of breathing, satting well on RA  Abdomen:  soft, nontender, non-distended, no hepatosplenomegaly appreciated. No axillary or inguinal lymphadenopathy palpated. Extremities: no edema or cyanosis. WWP. Skin: warm and dry, no rashes noted Neuro: alert and oriented, no focal deficits, 5/5 muscle strength upper and lower extremity, EOMI, PERRLA Psych: normal affect  Assessment &  Plan:  Continued health care is limited by patient is self-pay.  Recommend patient schedule an appointment with [name] for further financial assistance.  Also provided her with [facility] Financial assistance program contact information.  Patient will be applying for Medicaid shortly after her divorce is finalized and will hopefully be accepted.  Recent unintentional weight loss over several months Steadily lost approximately 30 pounds in the last year, associated with low-grade fevers, fatigue, night sweats, decreased appetite, and easy bruising over the last few months.  Differential is broad: could include cancer, depression, COPD, poor intake/access to food difficulty.  Most pressing differential to rule out includes hematological cancer in the setting of associated fatigue and easy bruising VS lung cancer with extensive smoking pack-year history.  Weight loss could also be secondary to progression of underlying lung disease.  Patient does not have a history of COPD, but highly likely given pack-year history and rhonchi/wheezing on exam with chronic cough. Could consider atypical presentation of depression, given report of loss of appetite/sleep changes/difficulty concentrating, however patient does not endorse feeling depressed or down.  Unlikely thyroid related, TSH WNL in 06/2018.  - Precepted with Dr. Owens Shark - Obtain CBC with differential, CMP, PT/INR, hep C - Schedule for CT chest to evaluate for lung abnormalities - Referral to GI for colonoscopy - Encouraged continued intake of favorite foods for nutrition, stay hydrated - Re-address access to food on follow-up visit  Skull deformity Worsened since last visit in 06/2018.  No abnormalities on her scalp to suggest infectious etiology or history of trauma.  Patient and family are quite concerned this is contributing to headaches, and concerned that there is a abnormality within her bone.  Also concerning that this deformity developed around the time she  started losing more weight and symptoms as above, will proceed with imaging, ? lytic lesion.  Discussed an x-ray prior to CT, however patient would like to proceed straight to CT so that her deformity can be best evaluated immediately.  Cigarette smoker Currently smokes 1 PPD, interested in quitting.  Previously had success with Wellbutrin, completely quit at that time.  Will restart Wellbutrin for smoking cessation, and may have additional added benefit to help with depression-like symptoms. - Prescribed Wellbutrin 150 mg, increase to twice daily in the next few days - Continued family support - Patient to notify if she needs additional cessation materials - Prescribed albuterol inhaler for wheezing/SOB with likely underlying COPD  Hyperlipidemia LDL 243 in 06/2018.  Not currently on statin therapy, will restart on Crestor at follow-up visit if patient amenable.  Migraine with aura Unchanged from previous, experiencing often. -Recommend Excedrin HA or ibuprofen as needed for onset of headache - Reconsider sumatriptan injections once patient is insured - CT head as above for further evaluation of skull deformity, possibly contributing - Patient to follow-up with a dentist for tooth extraction once financially able, may also help with migraine frequency - Continue heating pad as needed   Follow-up in 2 weeks for further management, and to discuss additional complaints.  Darrelyn Hillock, DO Family Medicine Resident PGY-1

## 2018-11-05 NOTE — Patient Instructions (Addendum)
[facility] [phone]   Medicaid  Department of Social Services Address: [address] [place], [place] [address] [facility] [address] [place], [place] [address]   [id number], [place] [address] State Courier #: [id number] Phone: [phone] Fax Number: [phone] Emergency Phone: [phone]  Please schedule an appointment with [name] to discuss financial assistance etc   It was wonderful to see you today.  I have sent prescription for Wellbutrin to help with smoking cessation and an albuterol inhaler for your cough.  I have ordered a CT of your head and your chest, to evaluate as we discussed.  Please call the center to schedule these appointments.  Lastly I will let you know the results of your lab tests in the next 1-2 weeks.  Thank you for choosing [facility].   Please call [phone] with any questions about today's appointment.  Please be sure to schedule follow up at the front  desk before you leave today, follow-up in the next 2- 3 weeks.   Darrelyn Hillock, DO  Family Medicine

## 2018-11-05 NOTE — Assessment & Plan Note (Signed)
Worsened since last visit in 06/2018.  No abnormalities on her scalp to suggest infectious etiology or history of trauma.  Patient and family are quite concerned this is contributing to headaches, and concerned that there is a abnormality within her bone.  Also concerning that this deformity developed around the time she started losing more weight and symptoms as above, will proceed with imaging, ? lytic lesion.  Discussed an x-ray prior to CT, however patient would like to proceed straight to CT so that her deformity can be best evaluated immediately.

## 2018-11-05 NOTE — Assessment & Plan Note (Addendum)
Steadily lost approximately 30 pounds in the last year, associated with low-grade fevers, fatigue, night sweats, decreased appetite, and easy bruising over the last few months.  Differential is broad: could include cancer, depression, COPD, poor intake/access to food difficulty.  Most pressing differential to rule out includes hematological cancer in the setting of associated fatigue and easy bruising VS lung cancer with extensive smoking pack-year history.  Weight loss could also be secondary to progression of underlying lung disease.  Patient does not have a history of COPD, but highly likely given pack-year history and rhonchi/wheezing on exam with chronic cough. Could consider atypical presentation of depression, given report of loss of appetite/sleep changes/difficulty concentrating, however patient does not endorse feeling depressed or down.  Unlikely thyroid related, TSH WNL in 06/2018.  - Precepted with Dr. Owens Shark - Obtain CBC with differential, CMP, PT/INR, hep C - Schedule for CT chest to evaluate for lung abnormalities - Referral to GI for colonoscopy - Encouraged continued intake of favorite foods for nutrition, stay hydrated - Re-address access to food on follow-up visit

## 2018-11-05 NOTE — Assessment & Plan Note (Signed)
Unchanged from previous, experiencing often. -Recommend Excedrin HA or ibuprofen as needed for onset of headache - Reconsider sumatriptan injections once patient is insured - CT head as above for further evaluation of skull deformity, possibly contributing - Patient to follow-up with a dentist for tooth extraction once financially able, may also help with migraine frequency - Continue heating pad as needed

## 2018-11-06 LAB — COMPREHENSIVE METABOLIC PANEL
ALT: 12 IU/L (ref 0–32)
AST: 19 IU/L (ref 0–40)
Albumin/Globulin Ratio: 2.4 — ABNORMAL HIGH (ref 1.2–2.2)
Albumin: 4.7 g/dL (ref 3.5–5.5)
Alkaline Phosphatase: 71 IU/L (ref 39–117)
BUN/Creatinine Ratio: 11 (ref 9–23)
BUN: 8 mg/dL (ref 6–24)
Bilirubin Total: 0.4 mg/dL (ref 0.0–1.2)
CO2: 25 mmol/L (ref 20–29)
Calcium: 9.8 mg/dL (ref 8.7–10.2)
Chloride: 102 mmol/L (ref 96–106)
Creatinine, Ser: 0.71 mg/dL (ref 0.57–1.00)
GFR calc Af Amer: 113 mL/min/{1.73_m2} (ref >59)
GFR calc non Af Amer: 98 mL/min/{1.73_m2} (ref >59)
Globulin, Total: 2 g/dL (ref 1.5–4.5)
Glucose: 88 mg/dL (ref 65–99)
Potassium: 4.4 mmol/L (ref 3.5–5.2)
Sodium: 142 mmol/L (ref 134–144)
Total Protein: 6.7 g/dL (ref 6.0–8.5)

## 2018-11-06 LAB — CBC WITH DIFFERENTIAL/PLATELET
Basophils Absolute: 0.1 10*3/uL (ref 0.0–0.2)
Basos: 1 %
EOS (ABSOLUTE): 0.2 10*3/uL (ref 0.0–0.4)
Eos: 3 %
Hematocrit: 39.7 % (ref 34.0–46.6)
Hemoglobin: 13.6 g/dL (ref 11.1–15.9)
Immature Grans (Abs): 0 10*3/uL (ref 0.0–0.1)
Immature Granulocytes: 0 %
Lymphocytes Absolute: 1.6 10*3/uL (ref 0.7–3.1)
Lymphs: 21 %
MCH: 30.1 pg (ref 26.6–33.0)
MCHC: 34.3 g/dL (ref 31.5–35.7)
MCV: 88 fL (ref 79–97)
Monocytes Absolute: 0.4 10*3/uL (ref 0.1–0.9)
Monocytes: 5 %
Neutrophils Absolute: 5.5 10*3/uL (ref 1.4–7.0)
Neutrophils: 70 %
Platelets: 244 10*3/uL (ref 150–450)
RBC: 4.52 x10E6/uL (ref 3.77–5.28)
RDW: 13.6 % (ref 11.7–15.4)
WBC: 7.8 10*3/uL (ref 3.4–10.8)

## 2018-11-06 LAB — HCV COMMENT:

## 2018-11-06 LAB — HEPATITIS C ANTIBODY (REFLEX): HCV Ab: <0.1 {s_co_ratio} (ref 0.0–0.9)

## 2018-11-06 LAB — PROTIME-INR
INR: 1 (ref 0.8–1.2)
Prothrombin Time: 10.4 s (ref 9.1–12.0)

## 2018-11-07 ENCOUNTER — Encounter: Payer: Self-pay | Admitting: Family Medicine

## 2018-11-11 ENCOUNTER — Ambulatory Visit
Admission: RE | Admit: 2018-11-11 | Discharge: 2018-11-11 | Disposition: A | Discharge status: Home / Self Care | Payer: Self-pay | Source: Ambulatory Visit | Attending: Family Medicine | Admitting: Family Medicine

## 2018-11-11 DIAGNOSIS — G8929 Other chronic pain: Secondary | ICD-10-CM

## 2018-11-11 DIAGNOSIS — R059 Cough, unspecified: Secondary | ICD-10-CM

## 2018-11-11 DIAGNOSIS — R51 Headache: Principal | ICD-10-CM

## 2018-11-11 DIAGNOSIS — R519 Headache, unspecified: Secondary | ICD-10-CM

## 2018-11-11 DIAGNOSIS — R05 Cough: Secondary | ICD-10-CM

## 2018-11-11 MED ORDER — IOPAMIDOL (ISOVUE-300) INJECTION 61%
75.0000 mL | Freq: Once | INTRAVENOUS | Status: AC | PRN
  Administered 2018-11-11: 75 mL via INTRAVENOUS

## 2018-11-12 ENCOUNTER — Telehealth: Payer: Self-pay | Admitting: Family Medicine

## 2018-11-12 NOTE — Telephone Encounter (Signed)
Call patient on 1/14 to discuss CT head and chest results.  Informed the patient her CT head scan was normal.  Also discussed pertinent positives of CT chest including the 8 mm pulmonary nodule and interstitial lung disease changes.  Discussed with patient that we will be obtaining PFTs in the near future and continue monitoring the nodule with a CT scan in 6 months.  Patient verbalized understanding.  Patriciaann Clan, DO

## 2018-11-25 ENCOUNTER — Other Ambulatory Visit: Payer: Self-pay

## 2018-11-25 ENCOUNTER — Ambulatory Visit (INDEPENDENT_AMBULATORY_CARE_PROVIDER_SITE_OTHER): Payer: Self-pay | Admitting: Family Medicine

## 2018-11-25 VITALS — BP 110/70 | HR 57 | Temp 98.0°F | Ht 60.75 in | Wt 104.8 lb

## 2018-11-25 DIAGNOSIS — M79604 Pain in right leg: Secondary | ICD-10-CM

## 2018-11-25 DIAGNOSIS — R634 Abnormal weight loss: Secondary | ICD-10-CM

## 2018-11-25 DIAGNOSIS — R053 Chronic cough: Secondary | ICD-10-CM

## 2018-11-25 DIAGNOSIS — M545 Low back pain, unspecified: Secondary | ICD-10-CM

## 2018-11-25 DIAGNOSIS — R05 Cough: Secondary | ICD-10-CM

## 2018-11-25 DIAGNOSIS — R202 Paresthesia of skin: Secondary | ICD-10-CM

## 2018-11-25 DIAGNOSIS — E785 Hyperlipidemia, unspecified: Secondary | ICD-10-CM

## 2018-11-25 DIAGNOSIS — F17210 Nicotine dependence, cigarettes, uncomplicated: Secondary | ICD-10-CM

## 2018-11-25 MED ORDER — GABAPENTIN 100 MG PO CAPS: 100.0000 mg | ORAL_CAPSULE | Freq: Three times a day (TID) | ORAL | 1 refills | Status: DC

## 2018-11-25 NOTE — Progress Notes (Signed)
Subjective:    Patient ID: [name], female    DOB: December 05, 1965, 53 y.o.   MRN: [id number]   CC: Follow up   HPI: [name] is a 53 year old female presenting to discuss the following:   Follow-up imaging and labs completed at last visit: We discussed results of imaging and labs completed at last visit, showed patient images of CT chest and head.  Lower back pain: Present for a few weeks.  She has noticed it more after picking up her 85-year-old grandson more often.  Previously had a similar pain back in 2011/2012 when she had a bulging disc, however went away.  States the pain is an 8/10, constant aching but hurts worse when she bends forward.  She also notes that she is very uncomfortable when laying flat.  The pain is better when she leans back.  She has associated paresthesias to her right side to her knee, mainly when bending forward.  She has had no relief with 800 mg of ibuprofen every 6 hours.  She denies any associated fever, dysuria, bowel/bladder incontinence, saddle anesthesia, rash, weakness.  Chronic cough/COPD: Continues to be coughing intermittently throughout the day, wakes up at night coughing.  Prescribed albuterol at our last visit, she has had relief with this.  However, states she has been using albuterol approximately twice a day due to symptoms of feeling chest tightness, shortness of breath.  Would like to start her on a controlling inhaler and establish PFTs, however given she is self-pay she does not feel she could afford any of these currently.  She is about to start the process for applying for Medicaid, states they needed a form filled out by her PCP.  Smoking status reviewed: Started on Wellbutrin 2 weeks ago, up to 300 mg daily.  Tolerating well, no changes in sleep pattern.  Still smoking 1 PPD.  Review of Systems Per HPI, also denies recent illness, fever, headache, changes in vision, chest pain, shortness of breath, abdominal pain, N/V/D,  weakness   Patient Active Problem List   Diagnosis Date Noted  . Low back pain radiating to right lower extremity 11/26/2018  . Chronic cough 11/26/2018  . Recent unintentional weight loss over several months 11/05/2018  . Cigarette smoker 11/05/2018  . Paresthesia 07/22/2018  . 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 Hyperlipidemia 07/22/2018  . Health care maintenance 07/22/2018  . Depression 01/09/2012  . Menopause 01/09/2012  . Migraine with aura 05/09/2007  . MITRAL VALVE PROLAPSE, MILD M. R.-SBE PROPHYL 05/09/2007     Objective:  BP 110/70 (BP Location: Left Arm, Patient Position: Sitting, Cuff Size: Normal)   Pulse (!) 57   Temp 98 F (36.7 C)   Ht 5' 0.75" (1.543 m)   Wt 104 lb 12.8 oz (47.5 kg)   SpO2 97%   BMI 19.97 kg/m  Vitals and nursing note reviewed  General: NAD, pleasant Cardiac: RRR, normal heart sounds, no murmurs Respiratory: No increased work of breathing, occasional scattered expiratory wheeze/rhonchi.  Satting well on RA. Abdomen: soft, nontender, nondistended Extremities: no edema or cyanosis. WWP. Skin: warm and dry, no rashes noted MSK/neuro: No rash, erythema, deformity noted on her lower back.  Tender to palpation around L4-L5.  Full ROM of lower extremity.  5/5 lower extremity strength bilaterally.  2/4 patellar reflexes bilaterally.  Straight leg raise positive @ 45 degrees on the right, eliciting pain and paresthesias.  Sensation intact bilaterally in lower extremity.  Normal gait.  No CVA tenderness bilaterally. Psych: normal  affect  Assessment & Plan:    Low back pain radiating to right lower extremity Subacute, clinical presentation appears consistent with a herniated disc without any alarm symptoms/signs.  Discussed this will likely continue to resolve on its own in the next couple of weeks.  Recommended ice/heat, lidocaine patch OTC for symptomatic relief.  Also prescribed gabapentin today to help with the neuropathic pain.  Extensively discussed  return precautions, will consider imaging at follow-up if the pain has not improved.  Cigarette smoker Currently on Wellbutrin, tolerating well.  Continues to smoke 1 PPD.  Discussed starting to cut back on 1-2 cigarettes/day, patient amenable.   Chronic cough Symptoms compatible with COPD.  Currently has albuterol inhaler, provides relief.  Symptoms continue to be uncontrolled and would like to start her on a daily inhaler, however given her financial/insurance situation, she will not be able to afford any of these inhalers.  Patient would also like to postpone her PFTs due to cost, will likely have these done after her next follow-up.  - Will discuss situation with Dr. Valentina Lucks and [name], assess for any additional financial aid available - Smoking cessation as above -Albuterol PRN, counseled on appropriate use  Hyperlipidemia Plan to start statin at a future visit.  After discussion, patient was not amenable to starting a statin today as she is already experiencing lower back muscle pains and does not want to potentially add to this.  Recent unintentional weight loss over several months Weight stable since last visit.  Labs and imaging from previous visit all reassuring against lung, hematologic malignancies/abnormalities.  Patient to have colonoscopy performed when she has been approved for Medicaid.  Will continue to monitor this closely, encourage plentiful diet.  F/u in one month.   [facility] Resident PGY-1

## 2018-11-25 NOTE — Patient Instructions (Signed)
So nice to see you today!  Please continue to try cutting back on your smoking, we discussed cutting back a cigarette a day as you can.  For your cough, I will try to discuss with pharmacy to see if there is any cheaper daily inhalers that we can prescribe.  Lastly, for your back pain you may use heat/ice for 15 minutes at bedtime as needed, can try lidocaine patch over-the-counter, and I prescribed gabapentin which should hopefully help with the tingling sensations.  We will start with 100 mg 3 times a day, you may increase this to a maximum of 300 mg 3 times a day if you are tolerating it well.  If this makes you too sleepy, you may just take a tablet at night.  I would like to see you back in a month!

## 2018-11-26 ENCOUNTER — Encounter: Payer: Self-pay | Admitting: Family Medicine

## 2018-11-26 DIAGNOSIS — M545 Low back pain, unspecified: Secondary | ICD-10-CM | POA: Insufficient documentation

## 2018-11-26 DIAGNOSIS — M79604 Pain in right leg: Secondary | ICD-10-CM | POA: Insufficient documentation

## 2018-11-26 DIAGNOSIS — R053 Chronic cough: Secondary | ICD-10-CM | POA: Insufficient documentation

## 2018-11-26 DIAGNOSIS — R05 Cough: Secondary | ICD-10-CM | POA: Insufficient documentation

## 2018-11-26 NOTE — Assessment & Plan Note (Signed)
Symptoms compatible with COPD.  Currently has albuterol inhaler, provides relief.  Symptoms continue to be uncontrolled and would like to start her on a daily inhaler, however given her financial/insurance situation, she will not be able to afford any of these inhalers.  Patient would also like to postpone her PFTs due to cost, will likely have these done after her next follow-up.  - Will discuss situation with Dr. Valentina Lucks and [name], assess for any additional financial aid available - Smoking cessation as above -Albuterol PRN, counseled on appropriate use

## 2018-11-26 NOTE — Assessment & Plan Note (Signed)
Plan to start statin at a future visit.  After discussion, patient was not amenable to starting a statin today as she is already experiencing lower back muscle pains and does not want to potentially add to this.

## 2018-11-26 NOTE — Assessment & Plan Note (Addendum)
Currently on Wellbutrin, tolerating well.  Continues to smoke 1 PPD.  Discussed starting to cut back on 1-2 cigarettes/day, patient amenable.

## 2018-11-26 NOTE — Assessment & Plan Note (Signed)
Weight stable since last visit.  Labs and imaging from previous visit all reassuring against lung, hematologic malignancies/abnormalities.  Patient to have colonoscopy performed when she has been approved for Medicaid.  Will continue to monitor this closely, encourage plentiful diet.

## 2018-11-26 NOTE — Assessment & Plan Note (Addendum)
Subacute, clinical presentation appears consistent with a herniated disc without any alarm symptoms/signs.  Discussed this will likely continue to resolve on its own in the next couple of weeks.  Recommended ice/heat, lidocaine patch OTC for symptomatic relief.  Also prescribed gabapentin today to help with the neuropathic pain.  Extensively discussed return precautions, will consider imaging at follow-up if the pain has not improved.

## 2018-12-04 ENCOUNTER — Telehealth: Payer: Self-pay

## 2018-12-04 NOTE — Telephone Encounter (Signed)
Patient left message asking that PCP give her a call. No other info given.  Call back is [phone]  Danley Danker, RN [facility])

## 2018-12-05 ENCOUNTER — Other Ambulatory Visit: Payer: Self-pay | Admitting: Family Medicine

## 2018-12-05 DIAGNOSIS — K0889 Other specified disorders of teeth and supporting structures: Secondary | ICD-10-CM

## 2018-12-05 MED ORDER — TRAMADOL HCL 50 MG PO TABS: 50.0000 mg | ORAL_TABLET | Freq: Three times a day (TID) | ORAL | 0 refills | Status: AC | PRN

## 2018-12-05 MED ORDER — AMOXICILLIN 500 MG PO CAPS: 500.0000 mg | ORAL_CAPSULE | Freq: Two times a day (BID) | ORAL | 0 refills | Status: AC

## 2018-12-05 NOTE — Telephone Encounter (Signed)
Called patient back.  States she has been having some posterior gum swelling and tooth pain that radiates up into her eye for the past 2 days.  Has been getting worse, requesting antibiotic.  Has not been able to see a dentist due to cost.  Has history of poor dentition.  Will be putting in an antibiotic, pain medication, and urgent referral to Dent[facility] clinic [facility].

## 2018-12-05 NOTE — Telephone Encounter (Signed)
Called patient, no answer and left voicemail. If patient calls the clinic, please inquire her concerns. I will be in the clinic all day.   Thank you!  Patriciaann Clan, DO

## 2018-12-05 NOTE — Progress Notes (Signed)
Spoke with patient over the phone, patient reporting a 2-day history of worsening posterior tooth pain and gingival swelling/erythema.  States she is not been able to eat or drink anything due to the severe shooting pains.  Denies any fever, fatigue, neurological changes.  History of poor dentition, broken posterior molars.  She has not been able to see a dentist in several years due to the cost. She spoke with 1 dentist over the phone today, stated would be $200 to see them.  Differential including severe broken tooth, abscess, gingivitis, cavity.  Will send in amoxicillin for 5 days and a few day course of tramadol to help with the pain.  Will also put an urgent referral to the health department's teeth clinic.  Discussed patient being evaluated earlier/going to the ED if she has no improvement in the next 24 hours, continuing to not be able to eat or drink anything etc. She voices understanding.    Orders Placed This Encounter  Procedures  . Ambulatory referral to Dentistry    Referral Priority:   Urgent    Referral Type:   Consultation    Referral Reason:   Specialty Services Required    Requested Specialty:   Dental General Practice    Number of Visits Requested:   [place], DO

## 2018-12-05 NOTE — Telephone Encounter (Signed)
Patient returned call. Wants to speak to PCP regarding medication.  Call back is [phone].  Danley Danker, RN [facility] RN)

## 2018-12-12 ENCOUNTER — Ambulatory Visit: Payer: Self-pay | Admitting: Family Medicine

## 2018-12-12 NOTE — Progress Notes (Deleted)
   Subjective:    Patient ID: [name], female    DOB: November 04, 1965, 53 y.o.   MRN: [id number]   CC: breathing and back pain   HPI: [name] is a 53 year old female presenting to discuss the following:]  Back pain:  Breathing concern:   Smoking status reviewed  Review of Systems Per HPI, also denies recent illness, fever, headache, changes in vision, chest pain, shortness of breath, abdominal pain, N/V/D, weakness   Patient Active Problem List   Diagnosis Date Noted  . Low back pain radiating to right lower extremity 11/26/2018  . Chronic cough 11/26/2018  . Recent unintentional weight loss over several months 11/05/2018  . Cigarette smoker 11/05/2018  . Paresthesia 07/22/2018  . 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 Hyperlipidemia 07/22/2018  . Health care maintenance 07/22/2018  . Depression 01/09/2012  . Menopause 01/09/2012  . Migraine with aura 05/09/2007  . MITRAL VALVE PROLAPSE, MILD M. R.-SBE PROPHYL 05/09/2007     Objective:  There were no vitals taken for this visit. Vitals and nursing note reviewed  General: NAD, pleasant Cardiac: RRR, normal heart sounds, no murmurs Respiratory: CTAB, normal effort Abdomen: soft, nontender, nondistended Extremities: no edema or cyanosis. WWP. Skin: warm and dry, no rashes noted Neuro: alert and oriented, no focal deficits Psych: normal affect  Assessment & Plan:    No problem-specific Assessment & Plan notes found for this encounter.    Darrelyn Hillock, DO Family Medicine Resident PGY-1

## 2019-01-27 ENCOUNTER — Other Ambulatory Visit: Payer: Self-pay | Admitting: Family Medicine

## 2019-01-27 ENCOUNTER — Telehealth: Payer: Self-pay | Admitting: *Deleted

## 2019-01-27 DIAGNOSIS — M545 Low back pain, unspecified: Secondary | ICD-10-CM

## 2019-01-27 DIAGNOSIS — M79604 Pain in right leg: Secondary | ICD-10-CM

## 2019-01-27 MED ORDER — TRAMADOL HCL 50 MG PO TABS: 50.0000 mg | ORAL_TABLET | Freq: Three times a day (TID) | ORAL | 0 refills | Status: DC | PRN

## 2019-01-27 MED ORDER — BACLOFEN 10 MG PO TABS: 5.0000 mg | ORAL_TABLET | Freq: Three times a day (TID) | ORAL | 0 refills | Status: AC | PRN

## 2019-01-27 NOTE — Telephone Encounter (Signed)
Pt is having Right leg, butt, lower back and hip. She reports a 8/10.  She has tried Ibuprofen 800mg  and  tylenol 400mg  with no relief.  She wonders if this could be because of her "bulging disk".  She has an OV on 01/30/19 (which will likely be canceled) and she would like for Dr. Higinio Plan to call her if possible.  [name], CMA

## 2019-01-27 NOTE — Telephone Encounter (Signed)
Call patient to further discuss her right leg, buttocks, and back pain.    She states this pain started on Friday, 3/27, in her lower back with radiating sharp pains into her right buttocks and back of her thigh.  Pain occasionally going down to her right calf.  Pain exacerbated by bending forward. Feels like her bottom and thigh are "on fire ". Denies precipitating trauma. Denies any associated fever, urinary retention, bowel/bladder incontinence, saddle anesthesia.  No associated muscle weakness or sensation changes (i.e. numbness).  She has been trying ibuprofen 800 mg and Tylenol 400 mg every 4-6 hours while awake with minimal relief.  Has also tried icy hot and heating pads without relief.  Of note, she had similar complaints back in January 2020 when I saw her last.  She states that pain resolved over time, however is now having a recurrence currently.  Reassured with no red flags during discussion and with current attempts to reduce spreading of Covid-19, will call to follow-up in 2 weeks (or sooner if needed). Will send in a short course of tramadol and baclofen to help with symptomatic relief.  Additionally recommended no heavy lifting.  Strict care precautions including presenting to the ED/call in the office were discussed, including sudden onset weakness, incontinence/retention, saddle anesthesia. She voiced understanding and agreement in plan.   Patriciaann Clan, DO

## 2019-01-28 ENCOUNTER — Other Ambulatory Visit: Payer: Self-pay | Admitting: Family Medicine

## 2019-01-28 DIAGNOSIS — R202 Paresthesia of skin: Secondary | ICD-10-CM

## 2019-01-28 MED ORDER — GABAPENTIN 100 MG PO CAPS: 100.0000 mg | ORAL_CAPSULE | Freq: Three times a day (TID) | ORAL | 1 refills | Status: DC

## 2019-01-30 ENCOUNTER — Ambulatory Visit: Payer: Self-pay | Admitting: Family Medicine

## 2019-02-10 ENCOUNTER — Encounter: Payer: Self-pay | Admitting: Family Medicine

## 2019-02-10 ENCOUNTER — Telehealth (INDEPENDENT_AMBULATORY_CARE_PROVIDER_SITE_OTHER): Payer: Self-pay | Admitting: Family Medicine

## 2019-02-10 ENCOUNTER — Other Ambulatory Visit: Payer: Self-pay

## 2019-02-10 DIAGNOSIS — W57XXXA Bitten or stung by nonvenomous insect and other nonvenomous arthropods, initial encounter: Secondary | ICD-10-CM

## 2019-02-10 DIAGNOSIS — R509 Fever, unspecified: Secondary | ICD-10-CM | POA: Insufficient documentation

## 2019-02-10 HISTORY — DX: Bitten or stung by nonvenomous insect and other nonvenomous arthropods, initial encounter: W57.XXXA

## 2019-02-10 NOTE — Assessment & Plan Note (Signed)
With cough and flu like symptoms could be viral pneumonia and possible covid.  Seems stable with no distress and good pulse ox.  No signs of bacterial pneumonia.  Have her continue supportive care.  Use mdi as needed.  Monitor her pulse ox and wear masks.  Call if worseng

## 2019-02-10 NOTE — Assessment & Plan Note (Signed)
Does not seem infected or indication of lyme disease.  She will send picture.  Local care

## 2019-02-10 NOTE — Progress Notes (Signed)
Subjective  JENNAVIEVE ARRICK is a 53 y.o. female is presenting with the following  [facility] Visit  Patient consented to have visit conducted via telephone.  Attempted visit with doxyme - her browser not supported  Encounter participants: Patient: [name]  Provider: Lind Covert  Others (if applicable): none  Chief Complaint: Cough fever tick bite  HPI: Cough  For 4 days associated with flu like symptoms and fever to 101.  No sick contacts.  Checked her moms pulse ox was 86 on Saturday.  Latest today Monday is 95-96.  She is using her inhaler several times a day. Actually feels better today with less shortness of breath but still cough  Tick Bite Discovered tick on back of knee last pm.  She removed it.  Small bump there now.  No redness or other rash   Pertinent PMHx: COPD - uses albuterol regulalry.  No hospitilizations   Exam:  Respiratory: occaisional cough no audible shortness of breath   Assessment/Plan:  No problem-specific Assessment & Plan notes found for this encounter.    Time spent on phone with patient: 20 minutes

## 2019-02-21 ENCOUNTER — Other Ambulatory Visit: Payer: Self-pay | Admitting: Family Medicine

## 2019-02-21 DIAGNOSIS — R202 Paresthesia of skin: Secondary | ICD-10-CM

## 2019-03-20 ENCOUNTER — Encounter: Payer: Self-pay | Admitting: Family Medicine

## 2019-03-26 ENCOUNTER — Other Ambulatory Visit: Payer: Self-pay

## 2019-03-26 ENCOUNTER — Encounter: Payer: Self-pay | Admitting: Family Medicine

## 2019-03-26 ENCOUNTER — Ambulatory Visit (INDEPENDENT_AMBULATORY_CARE_PROVIDER_SITE_OTHER): Payer: Self-pay | Admitting: Family Medicine

## 2019-03-26 VITALS — BP 100/60 | HR 65 | Temp 96.9°F | Wt 105.6 lb

## 2019-03-26 DIAGNOSIS — F17210 Nicotine dependence, cigarettes, uncomplicated: Secondary | ICD-10-CM

## 2019-03-26 DIAGNOSIS — M79604 Pain in right leg: Secondary | ICD-10-CM

## 2019-03-26 DIAGNOSIS — R059 Cough, unspecified: Secondary | ICD-10-CM

## 2019-03-26 DIAGNOSIS — M545 Low back pain, unspecified: Secondary | ICD-10-CM

## 2019-03-26 DIAGNOSIS — R05 Cough: Secondary | ICD-10-CM

## 2019-03-26 DIAGNOSIS — R202 Paresthesia of skin: Secondary | ICD-10-CM

## 2019-03-26 DIAGNOSIS — E785 Hyperlipidemia, unspecified: Secondary | ICD-10-CM

## 2019-03-26 DIAGNOSIS — R634 Abnormal weight loss: Secondary | ICD-10-CM

## 2019-03-26 MED ORDER — BUPROPION HCL ER (SR) 150 MG PO TB12: 150.0000 mg | ORAL_TABLET | Freq: Two times a day (BID) | ORAL | 0 refills | Status: DC

## 2019-03-26 MED ORDER — GABAPENTIN 100 MG PO CAPS: 100.0000 mg | ORAL_CAPSULE | Freq: Three times a day (TID) | ORAL | 0 refills | Status: DC

## 2019-03-26 NOTE — Progress Notes (Addendum)
Subjective:    Patient ID: [name], female    DOB: May 30, 1966, 53 y.o.   MRN: [id number]   CC: feet numb, cough, back pain   HPI: [name] is a 53 year old female presenting to discuss the following:  Bilateral feet numbness: Patient has a history of chronic bilateral paresthesias, burning sensation to her bilateral feet.  She feels this is slowly been progressing over the last several weeks, now having more numbness in her feet bilaterally, ankle down.  Due to her feet feeling numb, upon awakening a few days ago she fell to her side into the wall after not being able to feel her feet.  Intermittent.  Denies any associated weakness.  No history of diabetes, previous RPR, HIV, B12, TSH all WNL.  She also has concurrent chronic bilateral hand paresthesias that have not progressed during this time.  Is able to walk without concern while in the office.  Had a few tick bites in the last several weeks, no targetoid rashes.   Back pain: Acute on chronic.  Patient has a chronic longstanding history of lumbar back pain.  However has had at least 2 episodes of radicular back pain since January.  Now states that her back pain is increasing again with tingling sensation into her right thigh, similar to previous episodes.  She has been trying to rest which is helped some.  She has been using IcyHot, lidocaine patches, ibuprofen and milligrams every 4 hours as needed.  She did have some leftover tramadol which has been effective, used her last one today.  Denies any saddle anesthesia, bowel/bladder incontinence or retention, extremity weakness.  Acute cough: Intermittently has cough due to COPD, however developed worsening cough in the past day.  States it feels wet and will occasionally takes "nasty," however has not been able to produce any mucus.  Denies any associated sinus pressure, fever, myalgias, HA. Feels a little more short of breath only when she is having a coughing fit.  A little sore throat,  says only irritated from cough and not having pain while drinking or eating. Current smoker, down to 15 cigarettes a day from 20.  15/day cigs. From 20.  She has been using her albuterol inhaler about twice a day during this time.  She has no concern for COVID at this time, denies any known COVID exposures.  She has been self isolating at home, has only gone to the grocery store and back every 1-2 weeks.  No sick contacts.  Smoking status reviewed  Review of Systems Per HPI, also denies fever, headache, changes in vision, chest pain, abdominal pain, N/V/D, weakness   Patient Active Problem List   Diagnosis Date Noted  . Paresthesia of foot, bilateral 03/28/2019  . Cough in adult 03/28/2019  . Fever 02/10/2019  . Tick bite 02/10/2019  . Low back pain radiating to right lower extremity 11/26/2018  . Chronic cough 11/26/2018  . Recent unintentional weight loss over several months 11/05/2018  . Cigarette smoker 11/05/2018  . Paresthesia 07/22/2018  . 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 Hyperlipidemia 07/22/2018  . Health care maintenance 07/22/2018  . Depression 01/09/2012  . Menopause 01/09/2012  . Migraine with aura 05/09/2007  . MITRAL VALVE PROLAPSE, MILD M. R.-SBE PROPHYL 05/09/2007     Objective:  BP 100/60 (BP Location: Right Arm, Patient Position: Sitting, Cuff Size: Normal)   Pulse 65   Temp (!) 96.9 F (36.1 C) (Axillary)   Wt 105 lb 9.6 oz (47.9 kg)  SpO2 96%   BMI 20.12 kg/m  Vitals and nursing note reviewed  General: NAD, pleasant Cardiac: RRR, normal heart sounds, no murmurs Respiratory: normal effort, clear to auscultation the exception of bibasilar rhonchi that have been present in the past, appropriate sats on RA, some irritative coughing with deep inspiration Abdomen: soft, nontender, nondistended Extremities: no edema or cyanosis. WWP. Skin: warm and dry, no rashes noted MSK/neuro: Alert and oriented.  EOMI, PERRLA.  5/5 muscle strength in bilateral lower  extremity and hip, knee, and ankle joints. Sensation to light touch intact bilateral lower extremities, with the exception of slightly off in her left great toe.  2/4 patellar reflexes bilaterally. Tenderness to palpation around paraspinal musculature of L4-L5.  Negative straight leg raise test bilaterally, however did elicit increased back pain on the right test. Psych: normal affect  Assessment & Plan:  Unfortunately, medical care is limited by low socioeconomic status and lack of insurance.  Have attempted several times for patient to meet with [name] to apply for financial assistance and orange card, however endorses she has not been able to get hold of her.  During appointment, [name] was fortunately able to hand deliver forms to apply for [place] financial aid and orange card.  States she will bring these in completed next week and we will be able to fax these forms for her. Patient will continue applying for Medicaid.  Paresthesia of foot, bilateral Chronic concern, with slow progressive worsening.  Sensation to light touch and strength intact bilaterally on exam with the exception of slight abnormality in proprioception of her left great toe, no clear dermatomal regions.  Gait normal.  Unclear etiology. Hgb A1c, B12, TSH, HIV, RPR all WNL.  Unlikely related to lumbar abnormality given distribution.  Additionally, low concern that worsening is related to tick bites, would be quite an atypical presentation of Lyme disease. - Would like to proceed with a nerve conduction study, however patient is unable to afford this at this time. -Ensure slow transitions from sitting to standing - Consider rechecking HIV on next visit - Return precautions discussed extensively  Low back pain radiating to right lower extremity Acute on chronic back pain.  Third episode in the last several months, however no associated radicular symptoms currently.  Likely related to disc herniation vs arthritis.  However, given  increasing episodes of acute pain with recent weight loss over the past year, would like to further evaluate for any lumbar spinal malignancy.  Reassured no osseous abnormalities on CT chest performed earlier this year. - Obtain lumbar x-rays, patient to walk-in at convenience to [facility] - Alternate ibuprofen and Tylenol for pain control, has some leftover baclofen for spasms from previous episode - Can continue ice/heat, lidocaine patches - Provided with ROM/back strengthening exercises  Cough in adult Acute on chronic, 1 day history.  Likely related to viral URI, however could also consider secondary to chronic tobacco use and COPD.  Reassured she has been afebrile with a nonproductive cough and no significant acute lung findings on exam making bacterial pneumonia less likely.  Considered COPD exacerbation, however with 1 day history and nonproductive with appropriate O2, suspect URI over this.  Lastly, considered COVID, but without additional symptomatology and no known exposures while isolating, believe this is less likely. - Return precautions discussed extensively, including becoming febrile, productive cough, increasing shortness of breath etc. - Supportive measures, including honey, steam - May use albuterol inhaler as needed  Hyperlipidemia Would like to start a statin on  follow-up.  Recent unintentional weight loss over several months Reassured her weight has remained stable since last visit. Will continue to monitor.  - F/U x-rays as above - Consider repeat HIV, hep C, sed rate on follow-up - Unable to afford colonoscopy at this time, will consider more affordable option such as FOBT etc. in the meantime  Precepted with Dr. Erin Hearing.   F/U in the next few weeks to discuss chronic conditions and follow-up on current, or sooner if needed.  Darrelyn Hillock, DO Family Medicine Resident PGY-1

## 2019-03-26 NOTE — Patient Instructions (Addendum)
It was a wonderful seeing you today.  For your repetitive back pains, I would like to get an x-ray of your back.  You may walk into the [facility] imaging center whenever available to have this done.  I would also recommend alternating ibuprofen 600 mg every 6 hours and Tylenol 650 mg every 4 hours as needed to help with this pain.  Heat/ice pads are encouraged for up to 20 minutes at a time to help.  Make sure you maintain your stability before walking to make sure he has sensation in your feet.  I would like to get nerve conduction studies, however unfortunately this would be self-pay and expensive.  We will work towards trying to establish an additional assistance.  Lastly, for your cough continue to monitor this.  You may use your albuterol inhaler as needed.  Also try breathing and some steam, honey with tea.  If you develop a fever, worsening shortness of breath, or worsening productive cough, please return.  I would like you to follow-up in a few weeks so that we can go over additional chronic conditions and follow-up on these concerns.

## 2019-03-28 ENCOUNTER — Encounter: Payer: Self-pay | Admitting: Family Medicine

## 2019-03-28 DIAGNOSIS — R202 Paresthesia of skin: Secondary | ICD-10-CM

## 2019-03-28 DIAGNOSIS — R059 Cough, unspecified: Secondary | ICD-10-CM | POA: Insufficient documentation

## 2019-03-28 DIAGNOSIS — R05 Cough: Secondary | ICD-10-CM | POA: Insufficient documentation

## 2019-03-28 HISTORY — DX: Paresthesia of skin: R20.2

## 2019-03-28 NOTE — Assessment & Plan Note (Signed)
Would like to start a statin on follow-up.

## 2019-03-28 NOTE — Assessment & Plan Note (Signed)
Reassured her weight has remained stable since last visit. Will continue to monitor.  - F/U x-rays as above - Consider repeat HIV, hep C, sed rate on follow-up - Unable to afford colonoscopy at this time, will consider more affordable option such as FOBT etc. in the meantime

## 2019-03-28 NOTE — Assessment & Plan Note (Signed)
Chronic concern, with slow progressive worsening.  Sensation to light touch and strength intact bilaterally on exam with the exception of slight abnormality in proprioception of her left great toe, no clear dermatomal regions.  Gait normal.  Unclear etiology. Hgb A1c, B12, TSH, HIV, RPR all WNL.  Unlikely related to lumbar abnormality given distribution.  Additionally, low concern that worsening is related to tick bites, would be quite an atypical presentation of Lyme disease. - Would like to proceed with a nerve conduction study, however patient is unable to afford this at this time. -Ensure slow transitions from sitting to standing - Consider rechecking HIV on next visit - Return precautions discussed extensively

## 2019-03-28 NOTE — Assessment & Plan Note (Addendum)
Acute on chronic, 1 day history.  Likely related to viral URI, however could also consider secondary to chronic tobacco use and COPD.  Reassured she has been afebrile with a nonproductive cough and no significant acute lung findings on exam making bacterial pneumonia less likely.  Considered COPD exacerbation, however with 1 day history and nonproductive with appropriate O2, suspect URI over this.  Lastly, considered COVID, but without additional symptomatology and no known exposures while isolating, believe this is less likely. - Return precautions discussed extensively, including becoming febrile, productive cough, increasing shortness of breath etc. - Supportive measures, including honey, steam - May use albuterol inhaler as needed

## 2019-03-28 NOTE — Assessment & Plan Note (Signed)
Acute on chronic back pain.  Third episode in the last several months, however no associated radicular symptoms currently.  Likely related to disc herniation vs arthritis.  However, given increasing episodes of acute pain with recent weight loss over the past year, would like to further evaluate for any lumbar spinal malignancy.  Reassured no osseous abnormalities on CT chest performed earlier this year. - Obtain lumbar x-rays, patient to walk-in at convenience to [place] center - Alternate ibuprofen and Tylenol for pain control, has some leftover baclofen for spasms from previous episode - Can continue ice/heat, lidocaine patches - Provided with ROM/back strengthening exercises

## 2019-03-31 ENCOUNTER — Ambulatory Visit
Admission: RE | Admit: 2019-03-31 | Discharge: 2019-03-31 | Disposition: A | Discharge status: Home / Self Care | Payer: Self-pay | Source: Ambulatory Visit | Attending: Family Medicine | Admitting: Family Medicine

## 2019-03-31 ENCOUNTER — Other Ambulatory Visit: Payer: Self-pay

## 2019-03-31 DIAGNOSIS — M545 Low back pain, unspecified: Secondary | ICD-10-CM

## 2019-04-01 ENCOUNTER — Encounter: Payer: Self-pay | Admitting: Family Medicine

## 2019-04-03 ENCOUNTER — Telehealth: Payer: Self-pay | Admitting: Family Medicine

## 2019-04-03 NOTE — Telephone Encounter (Signed)
Called patient to discuss MyChart message from yesterday, 6/3.  Left voicemail for her to check my response and that I would attempt to call back a little bit later today so that we can discuss options moving forward.  Patriciaann Clan, DO

## 2019-04-04 ENCOUNTER — Telehealth: Payer: Self-pay | Admitting: Family Medicine

## 2019-04-04 NOTE — Telephone Encounter (Signed)
Called patient again this morning to discuss current symptoms and recommendations for MRI/referral.  No answer, left voicemail to call back to clinic this morning.  Patriciaann Clan, DO

## 2019-04-09 NOTE — Telephone Encounter (Signed)
Patient calls nurse line returning PCP call. I informed her of the need for MRI at this point and neurology referral. Patient is fine with both, however does not have health insurance at this time. I told her for the MRI she could work out a Agricultural consultant with them, however unsure about a neurology referral.   You can place the MRI order and I can schedule for her.

## 2019-04-10 ENCOUNTER — Telehealth: Payer: Self-pay | Admitting: *Deleted

## 2019-04-10 ENCOUNTER — Other Ambulatory Visit: Payer: Self-pay | Admitting: Family Medicine

## 2019-04-10 DIAGNOSIS — R29898 Other symptoms and signs involving the musculoskeletal system: Secondary | ICD-10-CM

## 2019-04-10 DIAGNOSIS — M545 Low back pain, unspecified: Secondary | ICD-10-CM

## 2019-04-10 NOTE — Telephone Encounter (Signed)
Would prefer sooner, is there an additional radiology location that would work with a payment plan for her to have this done?

## 2019-04-10 NOTE — Telephone Encounter (Signed)
-----   Message from Patriciaann Clan, DO sent at 04/10/2019  3:11 PM EDT ----- Regarding: MRI Can someone please schedule a lumbar MRI for this patient when available. Order has already been placed.  Thank you so much guys, you are wonderful!  Patriciaann Clan, DO

## 2019-04-10 NOTE — Telephone Encounter (Signed)
Can you put in another order that says [place], they said they cant use the order in there. Please thanks. [place] Kennon Holter, CMA

## 2019-04-10 NOTE — Telephone Encounter (Signed)
Called patient to verify she is okay with MRI, will work with payment plan.  States her symptoms are relapsing/remitting, currently not having any weakness and having mild paresthesias bilaterally.  Previous episode she contact me about was localized to her left leg with numbness and weakness-attempted to reach patient numerous times last week with instructions to present to the ED and need for MRI via voicemail.  Will likely place a neurology referral after MRI results, however understand barriers as patient has no insurance.   Again, patient informed to present to the ED if acute weakness, saddle anesthesia, bowel or bladder incontinence/retention develops.  Patriciaann Clan, DO

## 2019-04-10 NOTE — Telephone Encounter (Signed)
Pt scheduled for an MRI on July 1st at 12:20pm at [facility] imaging ([address]). Bring ID, insurance card, and mask.  They sad the order was STAT but first available is July. Is this ok? Johnthomas Lader Kennon Holter, CMA

## 2019-04-10 NOTE — Telephone Encounter (Signed)
Completed, new order placed.

## 2019-04-11 NOTE — Telephone Encounter (Signed)
Pt informed of appt on 6/20 @ 3 at [facility] 1st floor radiology. Must arrive at 2:30. Tylerjames Hoglund Kennon Holter, CMA

## 2019-04-14 ENCOUNTER — Other Ambulatory Visit: Payer: Self-pay

## 2019-04-14 ENCOUNTER — Ambulatory Visit (HOSPITAL_COMMUNITY)
Admission: RE | Admit: 2019-04-14 | Discharge: 2019-04-14 | Disposition: A | Discharge status: Home / Self Care | Payer: Self-pay | Source: Ambulatory Visit | Attending: Family Medicine | Admitting: Family Medicine

## 2019-04-14 DIAGNOSIS — R29898 Other symptoms and signs involving the musculoskeletal system: Secondary | ICD-10-CM | POA: Insufficient documentation

## 2019-04-14 MED ORDER — GADOBUTROL 1 MMOL/ML IV SOLN
5.0000 mL | Freq: Once | INTRAVENOUS | Status: AC | PRN
  Administered 2019-04-14: 5 mL via INTRAVENOUS

## 2019-04-18 ENCOUNTER — Other Ambulatory Visit: Payer: Self-pay | Admitting: Family Medicine

## 2019-04-18 DIAGNOSIS — R29898 Other symptoms and signs involving the musculoskeletal system: Secondary | ICD-10-CM

## 2019-04-18 MED ORDER — BACLOFEN 10 MG PO TABS: 10.0000 mg | ORAL_TABLET | Freq: Three times a day (TID) | ORAL | 0 refills | Status: DC | PRN

## 2019-04-18 NOTE — Progress Notes (Signed)
Called patient to discuss results of her MRI lumbar spine performed on 6/15, my epic inbox was being monitored by a colleague from 6/15 to 6/18.  Informed her of no acute findings within her MRI and small disc protrusion at L5-S1 without any nerve root encroachment.  States her numbness and weakness is better currently, "comes and goes," and most recently has experienced more similar symptoms in her right arm. Walking normally. Still has lower back pain (chronic in nature), has improvement with baclofen.  Denies any bladder/bowel incontinence/retention, saddle anesthesia, fever.  No current numbness/weakness.  She is concerned for her relapsing symptoms.   Overall, has a chronic (years +) history of bilateral feet and hand paresthesias with intermittent worsening. However now recently with acute worsening associated with left-sided lower leg weakness and increased numbness sensations (initially bilateral feet) that seems to be relapsing.  Physical exam in May generally unremarkable with appropriate gait and potentially decrease sensation around the left great toe (via monofilament).  Now experiencing some symptoms in her right arm.  Concern for structural neurological etiology, however could continue to keep functional neurological symptom disorder within the differential as her symptoms are not clear.  Will place urgent neurologic referral and discuss with [name] for options given patient has no insurance.  Patient is working towards qualifying for orange card, submitted paperwork this week.  Additionally will send in baclofen as this has provided her relief in the past.  Recommend she continue to use Tylenol/ibuprofen, lidocaine patches, ice/heat for her lower back.  Also instructed if patient is to experience acute weakness, numbness, saddle anesthesia, bowel/bladder incontinence/retention that she should present to the ED, patient endorsed understanding.  Encouraged her to follow-up in the clinic as soon as  possible.  Orders Placed This Encounter  Procedures  . Ambulatory referral to Neurology   Meds ordered this encounter  Medications  . baclofen (LIORESAL) 10 MG tablet    Sig: Take 1 tablet (10 mg total) by mouth 3 (three) times daily as needed for muscle spasms.    Dispense:  20 each    Refill:  [place], DO

## 2019-04-23 ENCOUNTER — Other Ambulatory Visit: Payer: Self-pay | Admitting: Family Medicine

## 2019-04-23 DIAGNOSIS — F17210 Nicotine dependence, cigarettes, uncomplicated: Secondary | ICD-10-CM

## 2019-04-29 ENCOUNTER — Encounter: Payer: Self-pay | Admitting: Neurology

## 2019-04-30 ENCOUNTER — Other Ambulatory Visit: Payer: Self-pay

## 2019-04-30 ENCOUNTER — Encounter: Payer: Self-pay | Admitting: Neurology

## 2019-04-30 ENCOUNTER — Telehealth (INDEPENDENT_AMBULATORY_CARE_PROVIDER_SITE_OTHER): Payer: Self-pay | Admitting: Neurology

## 2019-04-30 DIAGNOSIS — R202 Paresthesia of skin: Secondary | ICD-10-CM

## 2019-04-30 NOTE — Progress Notes (Signed)
New Patient Virtual Visit via Video Note The purpose of this virtual visit is to provide medical care while limiting exposure to the novel coronavirus.    Consent was obtained for video visit:  Yes.   Answered questions that patient had about telehealth interaction:  Yes.   I discussed the limitations, risks, security and privacy concerns of performing an evaluation and management service by telemedicine. I also discussed with the patient that there may be a patient responsible charge related to this service. The patient expressed understanding and agreed to proceed.  Pt location: Home Physician Location: office Name of referring provider:  McDiarmid, Blane Ohara, MD I connected with [name] at patients initiation/request on 04/30/2019 at  8:10 AM EDT by video enabled telemedicine application and verified that I am speaking with the correct person using two identifiers. Pt MRN:  [id number] Pt DOB:  [birth year] Video Participants:  [name]    History of Present Illness: [name] is a 53 y.o. right-handed female with migraine, chronic low back, COPD, and depression presenting for evaluation of generalized paresthesias.   Starting around 2016, she began having burning sensation over the hands and feet which was intermittent.  Over the past 6 months, she reports that symptoms have intensified and involve the entirety of her arms, thighs, lower legs, and feet.  Symptoms occur in spells throughout the day, lasting several hours.  She has not identified any triggers.  She does report finding tick bites on her over the past few months.  She also complains of a multitude of other symptoms such as indentation in the scalp, chronic low back pain, and numbness of the head.  She denies any problems with balance or walking.  She walks unassisted.  Previous evaluation has included MRI lumbar spine most recently in June 2020 which was did not show any nerve impingement.  Laboratory testing has  included hemoglobin A1c, TSH, vitamin B12, HIV and RPR which is normal.  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  She is trying to get disability.   Out-side paper records, electronic medical record, and images have been reviewed where available and summarized as:  MRI lumbar spine wwo contrast 04/14/2019: 1. No acute findings or explanation for the patient's symptoms. 2. Small right paracentral disc protrusion at L5-S1 without nerve root encroachment.  Lab Results  Component Value Date   HGBA1C 5.4 07/22/2018   Lab Results  Component Value Date   ELFYBOFB51 025 07/22/2018   Lab Results  Component Value Date   TSH 2.500 07/22/2018   No results found for: ESRSEDRATE, POCTSEDRATE  Past Medical History:  Diagnosis Date  . Allergy   . Anxiety   . Cancer ([place])   . Chronic kidney disease    KIDNEYSTONES  . Depression   . Heart murmur    MITRAL VALVE PROLASP  . High cholesterol   . History of endometriosis   . Incontinence   . Menopausal symptoms   . Migraine   . Osteoporosis   . Skin cancer   . Thyroid disease     Past Surgical History:  Procedure Laterality Date  . ANKLE SURGERY     RIGHT  . APPENDECTOMY    . BREAST ENHANCEMENT SURGERY     SALINE  . BUNIONECTOMY     BILATERAL FEET  . TOTAL ABDOMINAL HYSTERECTOMY W/ BILATERAL SALPINGOOPHORECTOMY    . TVT  2007  . WISDOM TOOTH EXTRACTION  X 4     Medications:  Outpatient Encounter Medications as of 04/30/2019  Medication Sig  . albuterol (PROVENTIL HFA;VENTOLIN HFA) 108 (90 Base) MCG/ACT inhaler Inhale 2 puffs into the lungs every 6 (six) hours as needed for wheezing or shortness of breath.  Marland Kitchen aspirin 81 MG chewable tablet Chew 81 mg by mouth daily.  . baclofen (LIORESAL) 10 MG tablet Take 1 tablet (10 mg total) by mouth 3 (three) times daily as needed for muscle spasms.  Marland Kitchen buPROPion (WELLBUTRIN SR) 150 MG 12 hr tablet TAKE 1 TABLET BY MOUTH TWICE A DAY  . gabapentin  (NEURONTIN) 100 MG capsule Take 1 capsule (100 mg total) by mouth 3 (three) times daily.  Marland Kitchen MELATONIN PO Take 1 tablet by mouth at bedtime as needed (sleep).  . traMADol (ULTRAM) 50 MG tablet Take 1 tablet (50 mg total) by mouth every 8 (eight) hours as needed for severe pain.   No facility-administered encounter medications on file as of 04/30/2019.     Allergies:  Allergies  Allergen Reactions  . Pseudoeph-Doxylamine-Dm-Apap     REACTION: keeps awake, legs constantly moving  [place]    Family History: Family History  Problem Relation Age of Onset  . Diabetes Mother   . Heart disease Mother   . Hyperlipidemia Mother   . Tuberculosis Father   . Alcohol abuse Father   . Heart disease Sister   . Hyperlipidemia Sister   . Heart disease Brother   . Hyperlipidemia Brother   . Diabetes Maternal Grandmother   . Diabetes Maternal Grandfather     Social History: Social History   Tobacco Use  . Smoking status: Current Every Day Smoker    Packs/day: 1.00    Years: 25.00    Pack years: 25.00  . Smokeless tobacco: Never Used  Substance Use Topics  . Alcohol use: Yes    Comment: OCCASIONALLY  . 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  Trying to get disability.   Lives with mom.    Highest level of education:  High school    Review of Systems:  CONSTITUTIONAL: No fevers, chills, night sweats, or weight loss.   EYES: No visual changes or eye pain ENT: No hearing changes.  No history of nose bleeds.   RESPIRATORY: No cough, wheezing and shortness of breath.   CARDIOVASCULAR: Negative for chest pain, and palpitations.   GI: Negative for abdominal discomfort, blood in stools or black stools.  No recent change in bowel habits.   GU:  No history of incontinence.   MUSCLOSKELETAL: +history of joint pain or swelling.  No myalgias.   SKIN: Negative for lesions, rash, and itching.   HEMATOLOGY/ONCOLOGY: Negative  for prolonged bleeding, bruising easily, and swollen nodes.  No history of cancer.   ENDOCRINE: Negative for cold or heat intolerance, polydipsia or goiter.   PSYCH:  +depression or anxiety symptoms.   NEURO: As Above.   General Medical Exam:  Well appearing, comfortable.  Nonlabored breathing.  No deformity or edema.  No rash.  Neurological Exam: MENTAL STATUS including orientation to time, place, person, recent and remote memory, attention span and concentration, language, and fund of knowledge is normal.  Speech is not dysarthric.  CRANIAL NERVES:  Normal conjugate, extra-ocular eye movements in all directions of gaze.  No ptosis.  Normal facial symmetry and movements.  Normal shoulder shrug and head rotation.  Tongue is midline.  MOTOR:  Antigravity in all extremities.  No abnormal movements.  No pronator drift.    SENSORY & REFLEXES:  Unable to assess   COORDINATION/GAIT:  Intact rapid alternating movements bilaterally.  Able to rise from a chair without using arms.  Gait narrow based and stable.   IMPRESSION: Migratory paresthesias over the arms and legs, which does not conform to a cutaneous nerve or dermatomal distribution.  Symptoms are not classic for neuropathy, multiple sclerosis, or lumbosacral radiculopathy.  I have personally reviewed her MRI lumbar spine which shows very mild disc protrusion at L5-S1, which would not cause any of her symptoms.  I recommend electrodiagnostic testing of the left arm and leg to better localize her symptoms.  She does not have insurance at this time and will contact my office to schedule NCS/EMG once approved for orange card.   Follow Up Instructions:  I discussed the assessment and treatment plan with the patient. The patient was provided an opportunity to ask questions and all were answered. The patient agreed with the plan and demonstrated an understanding of the instructions.   The patient was advised to call back or seek an in-person  evaluation if the symptoms worsen or if the condition fails to improve as anticipated.  Total Time spent:  35 min   Alda Berthold, DO

## 2019-05-05 ENCOUNTER — Encounter: Payer: Self-pay | Admitting: Family Medicine

## 2019-05-07 ENCOUNTER — Other Ambulatory Visit: Payer: Self-pay

## 2019-05-07 ENCOUNTER — Telehealth (INDEPENDENT_AMBULATORY_CARE_PROVIDER_SITE_OTHER): Payer: Self-pay | Admitting: Family Medicine

## 2019-05-07 ENCOUNTER — Telehealth: Payer: Self-pay | Admitting: *Deleted

## 2019-05-07 DIAGNOSIS — J441 Chronic obstructive pulmonary disease with (acute) exacerbation: Secondary | ICD-10-CM

## 2019-05-07 DIAGNOSIS — R05 Cough: Secondary | ICD-10-CM

## 2019-05-07 DIAGNOSIS — R059 Cough, unspecified: Secondary | ICD-10-CM

## 2019-05-07 MED ORDER — TIOTROPIUM BROMIDE MONOHYDRATE 18 MCG IN CAPS: 18.0000 ug | ORAL_CAPSULE | Freq: Every day | RESPIRATORY_TRACT | 5 refills | Status: DC

## 2019-05-07 MED ORDER — PREDNISONE 20 MG PO TABS: 40.0000 mg | ORAL_TABLET | Freq: Every day | ORAL | 0 refills | Status: AC

## 2019-05-07 MED ORDER — DOXYCYCLINE HYCLATE 100 MG PO TABS: 200.0000 mg | ORAL_TABLET | Freq: Every day | ORAL | 0 refills | Status: AC

## 2019-05-07 NOTE — Telephone Encounter (Signed)
Pt has no insurance and the [name] will cost her $477.  She would like something cheaper called in.  She is able to pick up the prednisone and doxy as they only come to #30.  She will go ahead and pick those up and start them and wait to hear back about the inhaler [name], CMA

## 2019-05-07 NOTE — Progress Notes (Signed)
[place] Telemedicine Visit  Patient consented to have virtual visit. Method of visit: Video  Encounter participants: Patient: [name] - located at home Provider: Danna Hefty - located at home Others (if applicable): None  Chief Complaint: cough  HPI: Patient notes worsening cough and difficulty breathing x 2 weeks. She has a chronic cough but it has gotten worse. Wakes her up cough and now more short of breath with very short distances. She normally has no trouble walking moderate distances such as to the mailbox, but now gets short of breath walking to the bathroom or kitchen. She is unable to produce any sputum. No fevers or chills. She is a current tobacco user. Usually smokes 1PPD, but can only smoke ~10 cigs/day due to the trouble breathing. She has developed some rib pain from the coughing. Denies any chest pain, LE edema. No prior hospitalizations for COPD exacerbations. She's been using her albuterol inhaler 4-5x/day. She is not on any other inhalers. Patient has a pulse ox at home and notes her lowest O2 sats have been 89%. Her current O2 sat is 92%. Pulse 67. Denies any sick contact or recent travel. Denies any COVID exposure. She notes she only goes to the grocery store and wears a mask. She does not work.   ROS: per HPI  Pertinent PMHx: COPD  Exam:  Pulse: 67, O2 sat 92% Gen: sitting up comfortably, NAD Respiratory: wet cough appreciated throughout exam, talking in full sentences but appears tired at times  Skin: normal for ethnicity, no cyanosis appreciated   Assessment/Plan:  Cough in adult Symptoms most consistent with mild-moderate COPD exacerbation. Less likely pneumonia given how well appearing patient is, however still possible. Although no known exposure, symptoms do resemble COVID, thus believe testing is warranted in this setting. Order placed for COVID-19. Patient to receive call to schedule her test. Assessment during  encounter is reassuring and believe patient can safely be treated at home for her COPD exacerbation at this time. Prednisone, Doxycycline, and Spiriva called into pharmacy. Patient to continue Albuterol inhaler PRN. Return precautions discussed at length including worsening shortness of breath/difficulty breathing or drop in her O2 sats <88%. Patient understood and agreed to plan. Will plan to call and check in on patient in the morning.   Meds ordered this encounter  Medications  . predniSONE (DELTASONE) 20 MG tablet    Sig: Take 2 tablets (40 mg total) by mouth daily with breakfast for 5 days.    Dispense:  10 tablet    Refill:  0  . doxycycline (VIBRA-TABS) 100 MG tablet    Sig: Take 2 tablets (200 mg total) by mouth daily for 5 days.    Dispense:  10 tablet    Refill:  0  . tiotropium (SPIRIVA) 18 MCG inhalation capsule    Sig: Place 1 capsule (18 mcg total) into inhaler and inhale daily.    Dispense:  30 capsule    Refill:  5    Time spent during visit with patient: 25 minutes  Mina Marble, [place], PGY2

## 2019-05-07 NOTE — Assessment & Plan Note (Addendum)
Symptoms most consistent with mild-moderate COPD exacerbation. Less likely pneumonia given how well appearing patient is, however still possible. Although no known exposure, symptoms do resemble COVID, thus believe testing is warranted in this setting. Order placed for COVID-19. Patient to receive call to schedule her test. Assessment during encounter is reassuring and believe patient can safely be treated at home for her COPD exacerbation at this time. Prednisone, Doxycycline, and Spiriva called into pharmacy. Patient to continue Albuterol inhaler PRN. Return precautions discussed at length including worsening shortness of breath/difficulty breathing or drop in her O2 sats <88%. Patient understood and agreed to plan. Will plan to call and check in on patient in the morning.

## 2019-05-08 ENCOUNTER — Telehealth: Payer: Self-pay | Admitting: *Deleted

## 2019-05-08 DIAGNOSIS — Z20822 Contact with and (suspected) exposure to covid-19: Secondary | ICD-10-CM

## 2019-05-08 NOTE — Telephone Encounter (Signed)
Spoke to patient this morning. She notes she is feeling about the same but denies any worsening SOB or trouble breathing. She admits to having an upset stomach after taking the prednisone and antibiotic last night. Recommended patient take these medicines with a meal to help decrease GI upset. Patient also noted she did not want to get the COVID test completed because she "doesn't like things up her nose". Given unknown status of COVID infection, advised of safety precautions including trying to isolate from other family, frequent hand washing, disinfecting the bathroom between use, and the high-touch areas of the home, and wear a mask when around others or in public. Patient agreed and understood plan. Reiterated return precautions.    Patient unable to afford Spiriva inhaler given lack of insurance at this time. Reached out to Dr. Valentina Lucks for alternatives, however based on research does not appear there is a more affordable option at this time. Patient has already applied for the [facility] provided by [facility] and is waiting to hear back. Will hold off on LAMA inhaler and recommend starting once more financially able.

## 2019-05-08 NOTE — Telephone Encounter (Signed)
Left patient a voicemail to call back to schedule COVID 19 test.  Test ordered.

## 2019-05-08 NOTE — Telephone Encounter (Signed)
-----   Message from Junious Dresser, [place] sent at 05/07/2019  9:25 AM EDT ----- Regarding: FW: COVID test  ----- Message ----- From: Danna Hefty, DO Sent: 05/07/2019   9:20 AM EDT To: [facility] Red Pool Subject: COVID test                                     COVID Drive-Up Test Referral Criteria  Patient age: 53 y.o.  Symptoms: Cough and Shortness of breath, muscle aches  Underlying Conditions: No underlying conditions  Is the patient a first responder? No  Does the patient live or work in a high risk or high density environment: No  Is the patient a COVID convalescent patient who is 14-28 days symptom-free and interested in donating plasma for use as a therapeutic product? No

## 2019-05-23 ENCOUNTER — Encounter: Payer: Self-pay | Admitting: Family Medicine

## 2019-05-24 ENCOUNTER — Other Ambulatory Visit: Payer: Self-pay | Admitting: Family Medicine

## 2019-05-24 DIAGNOSIS — F17210 Nicotine dependence, cigarettes, uncomplicated: Secondary | ICD-10-CM

## 2019-05-27 ENCOUNTER — Other Ambulatory Visit: Payer: Self-pay | Admitting: Family Medicine

## 2019-05-27 DIAGNOSIS — R053 Chronic cough: Secondary | ICD-10-CM

## 2019-05-27 DIAGNOSIS — R05 Cough: Secondary | ICD-10-CM

## 2019-05-27 MED ORDER — BENZONATATE 200 MG PO CAPS: 200.0000 mg | ORAL_CAPSULE | Freq: Two times a day (BID) | ORAL | 0 refills | Status: DC | PRN

## 2019-06-24 ENCOUNTER — Other Ambulatory Visit: Payer: Self-pay

## 2019-06-24 ENCOUNTER — Encounter: Payer: Self-pay | Admitting: Family Medicine

## 2019-06-24 ENCOUNTER — Ambulatory Visit (INDEPENDENT_AMBULATORY_CARE_PROVIDER_SITE_OTHER): Payer: Self-pay | Admitting: Family Medicine

## 2019-06-24 ENCOUNTER — Encounter: Payer: Self-pay | Admitting: Neurology

## 2019-06-24 ENCOUNTER — Telehealth: Payer: Self-pay | Admitting: *Deleted

## 2019-06-24 VITALS — BP 95/70 | HR 65 | Temp 98.5°F | Wt 102.0 lb

## 2019-06-24 DIAGNOSIS — J441 Chronic obstructive pulmonary disease with (acute) exacerbation: Secondary | ICD-10-CM

## 2019-06-24 DIAGNOSIS — R202 Paresthesia of skin: Secondary | ICD-10-CM

## 2019-06-24 DIAGNOSIS — Z0000 Encounter for general adult medical examination without abnormal findings: Secondary | ICD-10-CM

## 2019-06-24 DIAGNOSIS — K0381 Cracked tooth: Secondary | ICD-10-CM

## 2019-06-24 DIAGNOSIS — Z1239 Encounter for other screening for malignant neoplasm of breast: Secondary | ICD-10-CM

## 2019-06-24 DIAGNOSIS — J449 Chronic obstructive pulmonary disease, unspecified: Secondary | ICD-10-CM

## 2019-06-24 DIAGNOSIS — E785 Hyperlipidemia, unspecified: Secondary | ICD-10-CM

## 2019-06-24 DIAGNOSIS — Z1211 Encounter for screening for malignant neoplasm of colon: Secondary | ICD-10-CM

## 2019-06-24 DIAGNOSIS — R053 Chronic cough: Secondary | ICD-10-CM

## 2019-06-24 DIAGNOSIS — F17210 Nicotine dependence, cigarettes, uncomplicated: Secondary | ICD-10-CM

## 2019-06-24 DIAGNOSIS — R911 Solitary pulmonary nodule: Secondary | ICD-10-CM

## 2019-06-24 DIAGNOSIS — R05 Cough: Secondary | ICD-10-CM

## 2019-06-24 DIAGNOSIS — K089 Disorder of teeth and supporting structures, unspecified: Secondary | ICD-10-CM

## 2019-06-24 MED ORDER — BUPROPION HCL ER (SR) 150 MG PO TB12: 150.0000 mg | ORAL_TABLET | Freq: Two times a day (BID) | ORAL | 1 refills | Status: DC

## 2019-06-24 MED ORDER — TIOTROPIUM BROMIDE MONOHYDRATE 18 MCG IN CAPS: 18.0000 ug | ORAL_CAPSULE | Freq: Every day | RESPIRATORY_TRACT | 5 refills | Status: DC

## 2019-06-24 MED ORDER — ATORVASTATIN CALCIUM 40 MG PO TABS: 40.0000 mg | ORAL_TABLET | Freq: Every day | ORAL | 1 refills | Status: DC

## 2019-06-24 MED ORDER — ROSUVASTATIN CALCIUM 20 MG PO TABS: 20.0000 mg | ORAL_TABLET | Freq: Every day | ORAL | 0 refills | Status: DC

## 2019-06-24 MED ORDER — GABAPENTIN 100 MG PO CAPS: 100.0000 mg | ORAL_CAPSULE | Freq: Three times a day (TID) | ORAL | 1 refills | Status: DC

## 2019-06-24 NOTE — Telephone Encounter (Signed)
I have just sent atorvastatin to the health dept. Thank you!   Best, Dr Higinio Plan

## 2019-06-24 NOTE — Progress Notes (Signed)
Subjective:    Patient ID: [name], female    DOB: Feb 25, 1966, 53 y.o.   MRN: [id number]   CC: Received orange card/dentist  HPI: [name] is a 53 yo female presenting to discuss the following:   Cracked tooth/poor dentition: Has been trying to see a dentist for almost a year now, however is not been able to afford in the past.  Recently qualified for the orange card and needs a referral placed for dentist today.  Overall has quite poor dentition. Her posterior molars on the right are cracked and darkened in appearance.  She has previously had severe pain related to these.  She has an appetite and wants to eat, however has had difficulty due to her teeth.  Has been eating avocado, mashed potatoes, applesauce, and smoothies to maintain her nutrition.  Bilateral paresthesias: Already evaluated by neurology in July, recommended calling when she qualified for the orange card to have a nerve conduction study performed.  She has cut back on the amount of smoking, down to 15 cigarettes a day from 20.  She is interested in quitting, her mom is also wanting to quit as well.  She is currently on Wellbutrin.  She would like to try nicotine replacement therapy, however says she has not been able to afford it in the past.   Requests referral for mammogram, colonoscopy.   COPD: previously tried to get onto controlling therapy, however has not been able to afford this in the past. Has intermittent coughing with sputum productive frequently. Not currently in exacerbation.    Smoking status reviewed as discussed above  Review of Systems Per HPI   Patient Active Problem List   Diagnosis Date Noted  . Poor dentition 06/25/2019  . Lung nodule, solitary 06/25/2019  . Paresthesia of foot, bilateral 03/28/2019  . Cough in adult 03/28/2019  . Fever 02/10/2019  . Tick bite 02/10/2019  . Low back pain radiating to right lower extremity 11/26/2018  . Chronic cough 11/26/2018  . Recent  unintentional weight loss over several months 11/05/2018  . Cigarette smoker 11/05/2018  . Paresthesia 07/22/2018  . 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 Hyperlipidemia 07/22/2018  . Health care maintenance 07/22/2018  . Depression 01/09/2012  . Menopause 01/09/2012  . Migraine with aura 05/09/2007  . MITRAL VALVE PROLAPSE, MILD M. R.-SBE PROPHYL 05/09/2007     Objective:  BP 95/70   Pulse 65   Temp 98.5 F (36.9 C) (Oral)   Wt 102 lb (46.3 kg)   SpO2 98%   BMI 19.43 kg/m  Vitals and nursing note reviewed  General: NAD, pleasant, thin middle aged female  HEENT: MMM. Poor dentition throughout. Right lower molars cracked and darkened. Middle lower incisor loose. No associated gingival swelling, erythema, or fever.  Cardiac: RRR, normal heart sounds, no murmurs Respiratory: CTAB, normal effort Abdomen: soft, nontender, nondistended Extremities: no edema or cyanosis. WWP. Skin: warm and dry, no rashes noted Neuro: alert and oriented, no gross deficits. Gait normal.  Psych: normal affect  Assessment & Plan:   Health care maintenance --Amb referral placed to GI for screening colonoscopy  --Referral placed for mammogram, given location information  --Papsmear not indicated, s/p complete hysterectomy  --Flu shot not covered by orange card within our clinic, instructed to go to health dept for this at her convenience   Poor dentition Including right lower cracked molars and loose lower incisor. Associated with decreased ability to eat, limiting to softer foods. Hopeful she will be able to  maintain/increase her weight after dental revision.  --Amb referral to dentistry program covered by orange card   --Discussed nutritional dense foods to incorporate into her diet  --Tylenol/ibuprofen PRN for pain  --Return precautions discussed including development of fever, localized gingival swelling/erythema, or drainage from gingiva   Paresthesia of foot, bilateral Patient to call [facility] for EMG now that she has the orange card for further evaluation.   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 Previously tolerated Crestor, however atorvastatin preferred through orange card/medication assistance. Discussed risks and benefits of starting statin today. AST/ALT wnl in 10/2018.  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 Previously have tried to get patient on controlling inhaler, however has been able to afford this.  --Provided samples of Incruse today, pharmacy student within clinic demonstrated how to use inhaler for patient  --Sent prescription for Spriva to health dept, will see if this can be covered through medication assistance program   Cigarette smoker Down to 15 cigarettes daily, from 20. Interested in complete cessation, congratulated on cutting back thus far. Her mother is also trying to quit smoking, has good support at home. Provided 1-800-quit-now resource for additional support and NRT cost assistance.  --Referral to Pharmacy clinic for tobacco cessation  --Cont Wellbutrin   Lung nodule, solitary 83mm nodule in LUL seen on CT chest in 10/2018. Recommended repeat evaluation in 6-12 months. Will hold off as patient continues to strive towards financially stability.  --Repeat CT chest prior to 10/2019 or sooner if needed    Follow up after meeting with pharmacy clinic, in the next 2-3 months or sooner if needed.   [facility] Resident PGY-2

## 2019-06-24 NOTE — Patient Instructions (Addendum)
Wonderful to see you today as always.  1.  I have placed a referral for colonoscopy-they will call to schedule, please make sure that you listen to your voicemail if you do not receive a call and call them back. 2.  Please make sure you get your mammogram 3.  I have printed the refills of your medications, take this at the [facility], let me know if these are not accepted and I will send in a prescription 4.  Please make sure you make a follow-up with Dr. Valentina Lucks to continue discussing quitting smoking.  Also make sure you call the 1 800 quit now for more resources and patch assistance. 5.  Last but certainly not least, please call the dentist today to schedule an appointment.  You can get a flu shot at the health department.

## 2019-06-24 NOTE — Telephone Encounter (Signed)
Crestor is not available @ the health department.  Can we send atorvastatin instead?  [name], CMA

## 2019-06-25 ENCOUNTER — Encounter: Payer: Self-pay | Admitting: Family Medicine

## 2019-06-25 DIAGNOSIS — R911 Solitary pulmonary nodule: Secondary | ICD-10-CM | POA: Insufficient documentation

## 2019-06-25 DIAGNOSIS — K089 Disorder of teeth and supporting structures, unspecified: Secondary | ICD-10-CM | POA: Insufficient documentation

## 2019-06-25 NOTE — Assessment & Plan Note (Signed)
Patient to call [facility] for EMG now that she has the orange card for further evaluation.

## 2019-06-25 NOTE — Assessment & Plan Note (Addendum)
Including right lower cracked molars and loose lower incisor. Associated with decreased ability to eat, limiting to softer foods. Hopeful she will be able to maintain/increase her weight after dental revision.  --Amb referral to dentistry program covered by orange card   --Discussed nutritional dense foods to incorporate into her diet  --Tylenol/ibuprofen PRN for pain  --Return precautions discussed including development of fever, localized gingival swelling/erythema, or drainage from gingiva

## 2019-06-25 NOTE — Assessment & Plan Note (Signed)
LDL 243, familial. Previously tolerated Crestor, however atorvastatin preferred through orange card/medication assistance. Discussed risks and benefits of starting statin today. AST/ALT wnl in 10/2018.  --Start Atorvastatin 40mg , take 20mg  for the first week --Recheck lipid panel in the next 2-3 months

## 2019-06-25 NOTE — Assessment & Plan Note (Addendum)
48mm nodule in LUL seen on CT chest in 10/2018. Recommended repeat evaluation in 6-12 months. Will hold off as patient continues to strive towards financially stability.  --Repeat CT chest prior to 10/2019 or sooner if needed

## 2019-06-25 NOTE — Assessment & Plan Note (Signed)
--  Amb referral placed to GI for screening colonoscopy  --Referral placed for mammogram, given location information  --Papsmear not indicated, s/p complete hysterectomy  --Flu shot not covered by orange card within our clinic, instructed to go to health dept for this at her convenience

## 2019-06-25 NOTE — Assessment & Plan Note (Signed)
Down to 15 cigarettes daily, from 20. Interested in complete cessation, congratulated on cutting back thus far. Her mother is also trying to quit smoking, has good support at home. Provided 1-800-quit-now resource for additional support and NRT cost assistance.  --Referral to Pharmacy clinic for tobacco cessation  --Cont Wellbutrin

## 2019-06-25 NOTE — Assessment & Plan Note (Signed)
Suspected COPD GOLD group B, has not been able to have PFTs performed yet. Previously have tried to get patient on controlling inhaler, however has been able to afford this.  --Provided samples of Incruse today, pharmacy student within clinic demonstrated how to use inhaler for patient  --Sent prescription for Spriva to health dept, will see if this can be covered through medication assistance program

## 2019-07-01 ENCOUNTER — Other Ambulatory Visit: Payer: Self-pay | Admitting: Family Medicine

## 2019-07-01 DIAGNOSIS — Z1231 Encounter for screening mammogram for malignant neoplasm of breast: Secondary | ICD-10-CM

## 2019-07-11 ENCOUNTER — Encounter: Payer: Self-pay | Admitting: Family Medicine

## 2019-07-24 ENCOUNTER — Other Ambulatory Visit: Payer: Self-pay

## 2019-07-24 DIAGNOSIS — R202 Paresthesia of skin: Secondary | ICD-10-CM

## 2019-07-24 NOTE — Addendum Note (Signed)
Addended by: Alda Berthold on: 07/24/2019 04:12 PM   Modules accepted: Orders

## 2019-07-29 ENCOUNTER — Ambulatory Visit: Payer: Self-pay | Admitting: Pharmacist

## 2019-07-31 ENCOUNTER — Other Ambulatory Visit: Payer: Self-pay

## 2019-07-31 ENCOUNTER — Ambulatory Visit (INDEPENDENT_AMBULATORY_CARE_PROVIDER_SITE_OTHER): Payer: Self-pay | Admitting: Neurology

## 2019-07-31 DIAGNOSIS — R202 Paresthesia of skin: Secondary | ICD-10-CM

## 2019-07-31 NOTE — Procedures (Signed)
[facility]  [address]  [place], [place] [address] Tel: [phone] Fax:  [phone] Test Date:  07/31/2019  Patient: [name] DOB: 20-Apr-1966 Physician: Narda Amber, DO  Sex: Female Height: 5\' 0"  Ref Phys: Narda Amber, DO  ID#: [id number] Temp: 33.0C Technician:    Patient Complaints: This is a 53 year old female referred for evaluation of generalized paresthesias involving the face, arms, and legs.  NCV & EMG Findings: Extensive electrodiagnostic testing of the left upper and lower extremity shows:  1. All sensory responses including the left median, ulnar, mixed palmar, sural, and superficial peroneal nerves are within normal limits. 2. All motor responses including the left median, ulnar, peroneal, and tibial nerves are within normal limits. 3. Left tibial H reflex study is within normal limits. 4. There is no evidence of active or chronic motor axonal loss changes affecting any of the tested muscles.  Motor unit configuration and recruitment pattern is within normal limits.  Impression: This is a normal study of the left upper and lower extremities.  In particular, there is no evidence of a sensorimotor polyneuropathy or cervical/lumbosacral radiculopathy.   ___________________________ Narda Amber, DO    Nerve Conduction Studies Anti Sensory Summary Table   Site NR Peak (ms) Norm Peak (ms) P-T Amp (V) Norm P-T Amp  Left Median Anti Sensory (2nd Digit)  33C  Wrist    3.0 <3.6 43.7 >15  Left Sup Peroneal Anti Sensory (Ant Lat Mall)  33C  12 cm    1.8 <4.6 19.4 >4  Left Sural Anti Sensory (Lat Mall)  33C  Calf    2.8 <4.6 23.5 >4  Left Ulnar Anti Sensory (5th Digit)  33C  Wrist    2.7 <3.1 38.4 >10   Motor Summary Table   Site NR Onset (ms) Norm Onset (ms) O-P Amp (mV) Norm O-P Amp Site1 Site2 Delta-0 (ms) Dist (cm) Vel (m/s) Norm Vel (m/s)  Left Median Motor (Abd Poll Brev)  33C  Wrist    2.7 <4.0 12.8 >6 Elbow Wrist 3.8  24.0 63 >50  Elbow    6.5  12.4         Left Peroneal Motor (Ext Dig Brev)  33C  Ankle    3.0 <6.0 7.6 >2.5 B Fib Ankle 6.8 32.0 47 >40  B Fib    9.8  7.3  Poplt B Fib 1.2 7.0 58 >40  Poplt    11.0  7.1         Left Tibial Motor (Abd Hall Brev)  33C  Ankle    3.0 <6.0 14.5 >4 Knee Ankle 6.4 38.0 59 >40  Knee    9.4  11.1         Left Ulnar Motor (Abd Dig Minimi)  33C  Wrist    2.0 <3.1 7.0 >7 B Elbow Wrist 2.9 19.0 66 >50  B Elbow    4.9  6.6  A Elbow B Elbow 1.6 10.0 63 >50  A Elbow    6.5  6.4          Comparison Summary Table   Site NR Peak (ms) Norm Peak (ms) P-T Amp (V) Site1 Site2 Delta-P (ms) Norm Delta (ms)  Left Median/Ulnar Palm Comparison (Wrist - 8cm)  33C  Median Palm    1.6 <2.2 68.5 Median Palm Ulnar Palm 0.2   Ulnar Palm    1.4 <2.2 18.3       H Reflex Studies   NR H-Lat (ms) Lat  Norm (ms) L-R H-Lat (ms)  Left Tibial (Gastroc)  33C     29.25 <35    EMG   Side Muscle Ins Act Fibs Psw Fasc Number Recrt Dur Dur. Amp Amp. Poly Poly. Comment  Left AntTibialis Nml Nml Nml Nml Nml Nml Nml Nml Nml Nml Nml Nml N/A  Left Gastroc Nml Nml Nml Nml Nml Nml Nml Nml Nml Nml Nml Nml N/A  Left Flex Dig Long Nml Nml Nml Nml Nml Nml Nml Nml Nml Nml Nml Nml N/A  Left RectFemoris Nml Nml Nml Nml Nml Nml Nml Nml Nml Nml Nml Nml N/A  Left GluteusMed Nml Nml Nml Nml Nml Nml Nml Nml Nml Nml Nml Nml N/A  Left 1stDorInt Nml Nml Nml Nml Nml Nml Nml Nml Nml Nml Nml Nml N/A  Left PronatorTeres Nml Nml Nml Nml Nml Nml Nml Nml Nml Nml Nml Nml N/A  Left Biceps Nml Nml Nml Nml Nml Nml Nml Nml Nml Nml Nml Nml N/A  Left Deltoid Nml Nml Nml Nml Nml Nml Nml Nml Nml Nml Nml Nml N/A  Left Triceps Nml Nml Nml Nml Nml Nml Nml Nml Nml Nml Nml Nml N/A      Waveforms:

## 2019-08-04 ENCOUNTER — Telehealth: Payer: Self-pay

## 2019-08-04 NOTE — Telephone Encounter (Signed)
Pt notified of results

## 2019-08-04 NOTE — Telephone Encounter (Signed)
-----   Message from Alda Berthold, DO sent at 08/04/2019  2:21 PM EDT ----- Please inform patient that her nerve testing is normal.  No evidence of nerve or muscle injury causing her symptoms.  If she has additional questions, recommend setting up follow-up visit to discuss.

## 2019-08-14 ENCOUNTER — Other Ambulatory Visit: Payer: Self-pay

## 2019-08-14 ENCOUNTER — Ambulatory Visit
Admission: RE | Admit: 2019-08-14 | Discharge: 2019-08-14 | Disposition: A | Discharge status: Home / Self Care | Payer: No Typology Code available for payment source | Source: Ambulatory Visit | Attending: Family Medicine | Admitting: Family Medicine

## 2019-08-14 ENCOUNTER — Other Ambulatory Visit: Payer: Self-pay | Admitting: Family Medicine

## 2019-08-14 DIAGNOSIS — Z1231 Encounter for screening mammogram for malignant neoplasm of breast: Secondary | ICD-10-CM

## 2019-08-29 ENCOUNTER — Telehealth (INDEPENDENT_AMBULATORY_CARE_PROVIDER_SITE_OTHER): Payer: Self-pay | Admitting: Family Medicine

## 2019-08-29 ENCOUNTER — Other Ambulatory Visit: Payer: Self-pay

## 2019-08-29 ENCOUNTER — Encounter: Payer: Self-pay | Admitting: Family Medicine

## 2019-08-29 DIAGNOSIS — R9389 Abnormal findings on diagnostic imaging of other specified body structures: Secondary | ICD-10-CM

## 2019-08-29 DIAGNOSIS — E785 Hyperlipidemia, unspecified: Secondary | ICD-10-CM

## 2019-08-29 DIAGNOSIS — K089 Disorder of teeth and supporting structures, unspecified: Secondary | ICD-10-CM

## 2019-08-29 DIAGNOSIS — J441 Chronic obstructive pulmonary disease with (acute) exacerbation: Secondary | ICD-10-CM

## 2019-08-29 DIAGNOSIS — F17210 Nicotine dependence, cigarettes, uncomplicated: Secondary | ICD-10-CM

## 2019-08-29 DIAGNOSIS — R05 Cough: Secondary | ICD-10-CM

## 2019-08-29 DIAGNOSIS — R053 Chronic cough: Secondary | ICD-10-CM

## 2019-08-29 DIAGNOSIS — Z659 Problem related to unspecified psychosocial circumstances: Secondary | ICD-10-CM

## 2019-08-29 MED ORDER — BENZONATATE 200 MG PO CAPS: 200.0000 mg | ORAL_CAPSULE | Freq: Two times a day (BID) | ORAL | 0 refills | Status: DC | PRN

## 2019-08-29 MED ORDER — PREDNISONE 20 MG PO TABS: 40.0000 mg | ORAL_TABLET | Freq: Every day | ORAL | 0 refills | Status: AC

## 2019-08-29 MED ORDER — AZITHROMYCIN 250 MG PO TABS: 250.0000 mg | ORAL_TABLET | Freq: Every day | ORAL | 0 refills | Status: DC

## 2019-08-29 MED ORDER — TRAMADOL HCL 50 MG PO TABS: 50.0000 mg | ORAL_TABLET | Freq: Three times a day (TID) | ORAL | 0 refills | Status: DC | PRN

## 2019-08-29 NOTE — Progress Notes (Signed)
[facility] Visit  Patient consented to have virtual visit. Method of visit: Video was attempted, but technology challenges prevented patient from using video, so visit was conducted via telephone.  Encounter participants: Patient: [name] - located at Home  Provider: Patriciaann Clan - located at [facility] Others (if applicable): none   Chief Complaint: Discuss dental, worsening cough  HPI: [name] is a 53 year old female presenting discuss the following:  Dental referral: Patient has a long-term history of extremely poor dentition with cracked molars.  She recently received orange card and health department financial assistance and a dentist referral was placed back in 05/2019, however the [facility] dentistry office for the health department is currently backed up for months due to the Covid pandemic.  She continues to have a lot of tooth pain that contributes to her inability to eat well.  Throbbing pain, mainly on the right, and sometimes will have shooting pains up her cheek into her ear.  Denies any areas of fluid collection within her gums, gingival erythema or drainage, fever.  She is very worried about getting her teeth taken care of because she has minimal finances, stressing about pain for her electric bill this month.  Cough: [facility], the following day she had a fever and myalgias.  The fever resolved after 1 day and myalgias after a few days, however since that time she has had a persistent nonproductive cough.  Says she seems to be coughing "all the time " and it is keeping her awake at night.  She tried some OTC cough syrup with minimal relief.  She is feeling rundown and fatigued.  Last time she was here she received samples of Incruse which she feels like has helped her breathing, however she has not been able to pick up the Spiriva because the health department needs her IRS forms to complete her financial  assistance.  Has albuterol at home, has used that occasionally with some improvement.  She is also feeling little bit more short of breath especially with coughing fits. Has a pulse ox at home and saw it dipped into upper 80s after walking a while a few days ago. Denies any fever, sputum production, chest pain, sore throat, rhinorrhea/nasal congestion, LE edema.  No known Covid exposures, she stays home mostly, no one else sick at home.  She currently working towards smoking cessation with nicotine patches and gum for as needed cravings.  She is quite concerned that her lung disease is progressing she has noticed more trouble with her breathing over the last several months.  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  Briefly seen via video for a few minutes, appeared comfortable.  Able to speak in full sentences without concern, however with frequent coughing.  Assessment/Plan:  COPD exacerbation (HCC) Acute (on chronic) 2-week history of persistent nonproductive cough with worsening dyspnea, suspicious for a presumed COPD/ILD exacerbation. Complete evaluation is challenged by telephone visit only, however reassuringly her breathing was unlabored via brief video.  Considered pneumonia, however as she is afebrile without sputum production and given duration of symptomatology, will hold off on CXR at this time. Unfortunately, her medical care has been severely challenged by social determinants, however just recently qualified for orange card and financial assistance.  Believe she truly needs to be seen by pulmonology especially for further evaluation of smoking-related interstitial lung disease seen on CT on 10/2018, her pulmonary status has been uncontrolled for quite  some time due to above. -Zithromax 500 mg for 1 day, 250 mg for the next 4 -Prednisone 40 mg for 5 days -Albuterol inhaler scheduled for the next 24 hours, as needed afterwards -Continue remainder of Incruse sample -Refilled Tessalon Perles for cough -Encourage supportive measures including staying well-hydrated, warm showers, honey -Refer to pulmonology for evaluation of smoking related ILD/possible mixed COPD picture, we have not been able to obtain PFTs for her previously due to cost, now not currently being performed in our clinic -ED precautions discussed including significant dyspnea  Cigarette smoker Currently on NRT with patches and gum.  Congratulated her on this huge step towards complete cessation and encouraged her to continue doing so.  Hyperlipidemia Started on atorvastatin 05/2019 for primary prevention and significant hypercholesteremia.  Will recheck lipid panel on follow-up.  Poor social situation Currently unemployed and has not been able to qualify for Medicaid, has been trying to qualify for disability.  This has greatly impacted her care.  Recently received orange card and HD financial assistance. Reassuringly has stable housing and food supply. -Will touch base with our social worker, Neoma Laming, to see if there is any additional resources we can provide for her  Poor dentition Chronic, including right lower cracked molars and loose lower incisor.  No symptoms concerning for abscess or acute infection.  Associated with decreased ability to eat and limiting herself to softer foods.  Unfortunately, dentistry through [facility] is full for the next several months.  Provided her with a list of dentist that accept uninsured/self-pay patients through [place].  She will be contacting these offices over the next week to see if any can provide affordable or payment plan options. -List provided through [facility] in short course of tramadol due to significant dental pain -May  also take Tylenol in addition as needed -Return precautions including localized gingival swelling/erythema, fluid collection, fever    Time spent during visit with patient: 19 minutes  Patriciaann Clan, DO

## 2019-08-29 NOTE — Assessment & Plan Note (Signed)
Started on atorvastatin 05/2019 for primary prevention and significant hypercholesteremia.  Will recheck lipid panel on follow-up.

## 2019-08-29 NOTE — Assessment & Plan Note (Addendum)
Acute (on chronic) 2-week history of persistent nonproductive cough with worsening dyspnea, suspicious for a presumed COPD/ILD exacerbation. Complete evaluation is challenged by telephone visit only, however reassuringly her breathing was unlabored via brief video.  Considered pneumonia, however as she is afebrile without sputum production and given duration of symptomatology, will hold off on CXR at this time. Unfortunately, her medical care has been severely challenged by social determinants, however just recently qualified for orange card and financial assistance. 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 -Zithromax 500 mg for 1 day, 250 mg for the next 4 -Prednisone 40 mg for 5 days -Albuterol inhaler scheduled for the next 24 hours, as needed afterwards -Continue remainder of Incruse sample -Refilled Tessalon Perles for cough -Encourage supportive measures including staying well-hydrated, warm showers, honey -Refer to pulmonology for evaluation of smoking related ILD/possible mixed COPD picture, we have not been able to obtain PFTs for her previously due to cost, now not currently being performed in our clinic -ED precautions discussed including significant dyspnea

## 2019-08-29 NOTE — Assessment & Plan Note (Addendum)
Chronic, including right lower cracked molars and loose lower incisor.  No symptoms concerning for abscess or acute infection.  Associated with decreased ability to eat and limiting herself to softer foods.  Unfortunately, dentistry through [facility] is full for the next several months.  Provided her with a list of dentist that accept uninsured/self-pay patients through [place].  She will be contacting these offices over the next week to see if any can provide affordable or payment plan options. -List provided through [facility] in short course of tramadol due to significant dental pain -May also take Tylenol in addition as needed -Return precautions including localized gingival swelling/erythema, fluid collection, fever

## 2019-08-29 NOTE — Assessment & Plan Note (Signed)
Currently on NRT with patches and gum.  Congratulated her on this huge step towards complete cessation and encouraged her to continue doing so.

## 2019-08-29 NOTE — Assessment & Plan Note (Addendum)
Currently unemployed and has not been able to qualify for Medicaid, has been trying to qualify for disability.  This has greatly impacted her care.  Recently received orange card and HD financial assistance. Reassuringly has stable housing and food supply. -Will touch base with our social worker, Neoma Laming, to see if there is any additional resources we can provide for her

## 2019-08-30 ENCOUNTER — Encounter: Payer: Self-pay | Admitting: Family Medicine

## 2019-09-04 ENCOUNTER — Telehealth: Payer: Self-pay

## 2019-09-04 DIAGNOSIS — R053 Chronic cough: Secondary | ICD-10-CM

## 2019-09-04 DIAGNOSIS — R05 Cough: Secondary | ICD-10-CM

## 2019-09-04 MED ORDER — BENZONATATE 200 MG PO CAPS: 200.0000 mg | ORAL_CAPSULE | Freq: Two times a day (BID) | ORAL | 0 refills | Status: DC | PRN

## 2019-09-04 NOTE — Telephone Encounter (Signed)
Patient calls nurse line stating the health department does not carry Tessalon. [facility] is [facility] [address].

## 2019-10-03 ENCOUNTER — Ambulatory Visit (INDEPENDENT_AMBULATORY_CARE_PROVIDER_SITE_OTHER): Payer: No Typology Code available for payment source | Admitting: Family Medicine

## 2019-10-03 ENCOUNTER — Other Ambulatory Visit: Payer: Self-pay

## 2019-10-03 VITALS — BP 115/70 | HR 65 | Wt 104.6 lb

## 2019-10-03 DIAGNOSIS — R61 Generalized hyperhidrosis: Secondary | ICD-10-CM

## 2019-10-03 DIAGNOSIS — J440 Chronic obstructive pulmonary disease with acute lower respiratory infection: Secondary | ICD-10-CM

## 2019-10-03 DIAGNOSIS — R202 Paresthesia of skin: Secondary | ICD-10-CM

## 2019-10-03 DIAGNOSIS — E785 Hyperlipidemia, unspecified: Secondary | ICD-10-CM

## 2019-10-03 DIAGNOSIS — F17210 Nicotine dependence, cigarettes, uncomplicated: Secondary | ICD-10-CM

## 2019-10-03 DIAGNOSIS — R05 Cough: Secondary | ICD-10-CM

## 2019-10-03 DIAGNOSIS — J209 Acute bronchitis, unspecified: Secondary | ICD-10-CM

## 2019-10-03 DIAGNOSIS — R053 Chronic cough: Secondary | ICD-10-CM

## 2019-10-03 DIAGNOSIS — R058 Other specified cough: Secondary | ICD-10-CM

## 2019-10-03 MED ORDER — DOXYCYCLINE HYCLATE 100 MG PO TABS: 100.0000 mg | ORAL_TABLET | Freq: Two times a day (BID) | ORAL | 0 refills | Status: AC

## 2019-10-03 MED ORDER — PREDNISONE 20 MG PO TABS: 40.0000 mg | ORAL_TABLET | Freq: Every day | ORAL | 0 refills | Status: AC

## 2019-10-03 MED ORDER — BUPROPION HCL ER (SR) 150 MG PO TB12: 150.0000 mg | ORAL_TABLET | Freq: Two times a day (BID) | ORAL | 1 refills | Status: DC

## 2019-10-03 MED ORDER — GABAPENTIN 100 MG PO CAPS: 100.0000 mg | ORAL_CAPSULE | Freq: Three times a day (TID) | ORAL | 1 refills | Status: DC

## 2019-10-03 MED ORDER — IPRATROPIUM BROMIDE HFA 17 MCG/ACT IN AERS: 2.0000 | INHALATION_SPRAY | RESPIRATORY_TRACT | 1 refills | Status: DC | PRN

## 2019-10-03 MED ORDER — ALBUTEROL SULFATE HFA 108 (90 BASE) MCG/ACT IN AERS: 2.0000 | INHALATION_SPRAY | Freq: Four times a day (QID) | RESPIRATORY_TRACT | 2 refills | Status: DC | PRN

## 2019-10-03 MED ORDER — ANORO ELLIPTA 62.5-25 MCG/INH IN AEPB: 1.0000 | INHALATION_SPRAY | Freq: Every day | RESPIRATORY_TRACT | 0 refills | Status: DC

## 2019-10-03 NOTE — Patient Instructions (Addendum)
It was a wonderful to see you today as always.  I have sent in a steroid pack, an antibiotic called doxycycline, and a new inhaler called ipratropium to [facility] health department.  I want you to take the albuterol and ipratropium together every 4 hours for the next 24 hours, from there you can back off to every 6 hours for the next day or so and then continue to back off as needed.  I want you to start taking the Anoro on a daily basis.  You can also get a chest x-ray at your convenience over at the [facility] imaging center.  I would like to hear from you in the next week, please give our clinic a call.  If your shortness of breath becomes significantly worse please go to the ED.  In terms of her night sweats, we will see how these do-hopefully your breathing starts to improve.  Further work-up will pending following this.

## 2019-10-03 NOTE — Progress Notes (Signed)
Subjective:    Patient ID: [name], female    DOB: 1966/08/17, 53 y.o.   MRN: [id number]   CC: Follow-up breathing  HPI: 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  Treated with Zithromax, prednisone, Tessalon Perles, and albuterol.  States she initially improved during treatment, however shortly after completion of treatment course she noticed her symptoms returning.  She notes that she continues to have a frequent nonproductive cough all throughout the day that is keeping her up at night.  Although she has not had any sputum production, says she feels like it "sounds wet ".  Feeling out of breath sooner than normal.  She has been using her albuterol inhaler more frequently, sometimes up to twice a day, does notice improvement in her breathing and cough after use of this.  Denies any associated fever or significant rhinorrhea, has checked on several occasions but has been feeling hot/cold randomly for the last month.  Endorses new night sweats for the past month as well that drenches her clothes and sheets, has to change outfit.  S/p hysterectomy several years ago.  She is still working towards quitting smoking with the nicotine patches, she will be going down to the 14 mg patch in 1 week.  She denies any known exposures to Covid, has rarely gone out and has been following appropriate precautions with mask and handwashing.  Awaiting pulmonary referral.   Smoking status reviewed  Review of Systems Per HPI    Objective:  BP 115/70   Pulse 65   Wt 104 lb 9.6 oz (47.4 kg)   SpO2 96%   BMI 19.93 kg/m  Vitals and nursing note reviewed  General: NAD, pleasant Cardiac: RRR, normal heart  sounds Respiratory: Diffuse inspiratory and expiratory rhonchi and wheezing throughout all lung fields anteriorly and posteriorly.  Cough elicited with deep inspiration, some improvement in rhonchi with cough.  Normal work of breathing without any associated tachypnea, accessory muscle use.  Able to speak in full sentences and satting appropriately on room air.  Walked around the clinic with CMA for several minutes with pulse ox, lowest desaturation to 95%. Extremities: no edema or cyanosis.  Radial pulses palpable bilaterally. Skin: warm and dry, no rashes noted Neuro: alert and oriented, no focal deficits Psych: normal affect  Assessment & Plan:   Recurrent non-productive cough Suspect multifactorial in setting of likely acute bronchitis with possible COPD/ILD exacerbation and smoker's cough, overlapping with her already baseline uncontrolled lung disease.  Will also continue to consider lung malignancy on differential with known 8 mm lung nodule seen on CT in 10/2018 and new night sweats.  Low concern for pneumonia given duration of symptoms and she has been afebrile without sputum production or focal pulmonary exam. Concurrently additionally have low concern for COVID given chronicity/minimal exposures and symptoms suggestive of above, however given current pandemic recommended she proceed with Covid testing and continue appropriate precautions.  Will go ahead and treat for exacerbation. -Sent in doxycycline twice daily for 7 days -Prednisone 40 mg for 5 days -DuoNebs every 4 hours for the next 24 hours, then can decrease to every 6 hours and then as needed as respiratory status improves -CXR  -Patient is still not able to get Spiriva through the health department yet due to waiting  for IRS paperwork but absolutely needs a controller inhaler, able to send her with a 1 month supply of Anoro samples today -Awaiting pulmonary referral -Follow-up in 1 week or sooner if needed  Night sweats 1 month  history of drenching night sweats in the setting of symptomatology above.  Would like to get her breathing status under better control and evaluate night sweats from there if persistent.  Continue to have malignancy on differential. S/p hysterectomy ~ 10 years ago without a continuation of menopausal symptoms. She is on Wellbutrin, however has been on this for quite some time, thus low suspicion this is prompting night sweats.  TSH WNL in 2019. -CBC -Pending persistence of night sweats, will likely proceed with further work-up including HIV, CRP, CMP -Does need repeat CT chest soon (recommend 6-12 month follow-up on CT chest in 10/2018) and will discuss getting this scheduled next week at follow-up  Hyperlipidemia Started atorvastatin in 05/2019 for primary prevention and significant hypercholesteremia.  Will recheck lipid panel today.  Cigarette smoker Currently on nicotine replacement therapy with patches, about to decrease to the 14 mg patch next week.  Continued to encourage her through this process and congratulated her on her efforts thus far.   Case discussed with Dr. Walker Kehr.  Follow-up in 1 week or sooner if needed  [facility] Resident PGY-2

## 2019-10-04 LAB — CBC WITH DIFFERENTIAL/PLATELET
Basophils Absolute: 0.1 10*3/uL (ref 0.0–0.2)
Basos: 1 %
EOS (ABSOLUTE): 0.4 10*3/uL (ref 0.0–0.4)
Eos: 6 %
Hematocrit: 38.4 % (ref 34.0–46.6)
Hemoglobin: 12.7 g/dL (ref 11.1–15.9)
Immature Grans (Abs): 0 10*3/uL (ref 0.0–0.1)
Immature Granulocytes: 0 %
Lymphocytes Absolute: 2.4 10*3/uL (ref 0.7–3.1)
Lymphs: 34 %
MCH: 29.6 pg (ref 26.6–33.0)
MCHC: 33.1 g/dL (ref 31.5–35.7)
MCV: 90 fL (ref 79–97)
Monocytes Absolute: 0.6 10*3/uL (ref 0.1–0.9)
Monocytes: 8 %
Neutrophils Absolute: 3.5 10*3/uL (ref 1.4–7.0)
Neutrophils: 51 %
Platelets: 313 10*3/uL (ref 150–450)
RBC: 4.29 x10E6/uL (ref 3.77–5.28)
RDW: 13.4 % (ref 11.7–15.4)
WBC: 6.9 10*3/uL (ref 3.4–10.8)

## 2019-10-04 LAB — LIPID PANEL
Chol/HDL Ratio: 3.8 ratio (ref 0.0–4.4)
Cholesterol, Total: 201 mg/dL — ABNORMAL HIGH (ref 100–199)
HDL: 53 mg/dL (ref >39)
LDL Chol Calc (NIH): 135 mg/dL — ABNORMAL HIGH (ref 0–99)
Triglycerides: 73 mg/dL (ref 0–149)
VLDL Cholesterol Cal: 13 mg/dL (ref 5–40)

## 2019-10-05 ENCOUNTER — Encounter: Payer: Self-pay | Admitting: Family Medicine

## 2019-10-05 DIAGNOSIS — R61 Generalized hyperhidrosis: Secondary | ICD-10-CM

## 2019-10-05 DIAGNOSIS — R05 Cough: Secondary | ICD-10-CM | POA: Insufficient documentation

## 2019-10-05 DIAGNOSIS — R058 Other specified cough: Secondary | ICD-10-CM | POA: Insufficient documentation

## 2019-10-05 HISTORY — DX: Generalized hyperhidrosis: R61

## 2019-10-05 NOTE — Assessment & Plan Note (Signed)
Started atorvastatin in 05/2019 for primary prevention and significant hypercholesteremia.  Will recheck lipid panel today.

## 2019-10-05 NOTE — Assessment & Plan Note (Addendum)
1 month history of drenching night sweats in the setting of symptomatology above.  Would like to get her breathing status under better control and evaluate night sweats from there if persistent.  Continue to have malignancy on differential. S/p hysterectomy ~ 10 years ago without a continuation of menopausal symptoms. She is on Wellbutrin, however has been on this for quite some time, thus low suspicion this is prompting night sweats.  TSH WNL in 2019. -CBC -Pending persistence of night sweats, will likely proceed with further work-up including HIV, CRP, CMP -Does need repeat CT chest soon (recommend 6-12 month follow-up on CT chest in 10/2018) and will discuss getting this scheduled next week at follow-up

## 2019-10-05 NOTE — Assessment & Plan Note (Signed)
Suspect multifactorial in setting of likely acute bronchitis with possible COPD/ILD exacerbation and smoker's cough, overlapping with her already baseline uncontrolled lung disease.  Will also continue to consider lung malignancy on differential with known 8 mm lung nodule seen on CT in 10/2018 and new night sweats.  Low concern for pneumonia given duration of symptoms and she has been afebrile without sputum production or focal pulmonary exam. Concurrently additionally have low concern for COVID given chronicity/minimal exposures and symptoms suggestive of above, however given current pandemic recommended she proceed with Covid testing and continue appropriate precautions.  Will go ahead and treat for exacerbation. -Sent in doxycycline twice daily for 7 days -Prednisone 40 mg for 5 days -DuoNebs every 4 hours for the next 24 hours, then can decrease to every 6 hours and then as needed as respiratory status improves -CXR  -Patient is still not able to get Spiriva through the health department yet due to waiting for IRS paperwork but absolutely needs a controller inhaler, able to send her with a 1 month supply of Anoro samples today -Awaiting pulmonary referral -Follow-up in 1 week or sooner if needed

## 2019-10-05 NOTE — Assessment & Plan Note (Signed)
Currently on nicotine replacement therapy with patches, about to decrease to the 14 mg patch next week.  Continued to encourage her through this process and congratulated her on her efforts thus far.

## 2019-10-06 ENCOUNTER — Telehealth: Payer: Self-pay | Admitting: Family Medicine

## 2019-10-06 ENCOUNTER — Other Ambulatory Visit: Payer: Self-pay

## 2019-10-06 ENCOUNTER — Other Ambulatory Visit: Payer: Self-pay | Admitting: Family Medicine

## 2019-10-06 ENCOUNTER — Ambulatory Visit
Admission: RE | Admit: 2019-10-06 | Discharge: 2019-10-06 | Disposition: A | Discharge status: Home / Self Care | Payer: No Typology Code available for payment source | Source: Ambulatory Visit | Attending: Family Medicine | Admitting: Family Medicine

## 2019-10-06 DIAGNOSIS — J209 Acute bronchitis, unspecified: Secondary | ICD-10-CM

## 2019-10-06 DIAGNOSIS — J440 Chronic obstructive pulmonary disease with acute lower respiratory infection: Secondary | ICD-10-CM

## 2019-10-06 DIAGNOSIS — R918 Other nonspecific abnormal finding of lung field: Secondary | ICD-10-CM

## 2019-10-06 NOTE — Progress Notes (Signed)
Discussed lab results with patient.  Additionally given her worsening dyspnea and cough over the last 2 months, feel she would benefit from repeating the CT chest now for follow-up of the 8 mm lung nodule and mucous plugging with endobronchial lesion that could not be excluded.  She already has a pending referral to pulmonary, however with the orange card only suspect this will take quite some time for her to get in and thus will get this repeat CT prior to their evaluation.  Patriciaann Clan, DO

## 2019-10-06 NOTE — Telephone Encounter (Signed)
Can we get her scheduled for a CT chest for follow-up of lung nodule?  Order has already been placed.  She only has the orange card, hopeful to see if we can get a cost estimate or payment plan in place prior to appointment.  Thank you! Dr Higinio Plan

## 2019-10-07 NOTE — Telephone Encounter (Signed)
Pt scheduled and informed. Appt is on 10/14/2019 @ 3, must arrive at 2:45 at [facility] cone. Leida Luton Kennon Holter, CMA

## 2019-10-14 ENCOUNTER — Other Ambulatory Visit: Payer: Self-pay

## 2019-10-14 ENCOUNTER — Ambulatory Visit (HOSPITAL_COMMUNITY)
Admission: RE | Admit: 2019-10-14 | Discharge: 2019-10-14 | Disposition: A | Discharge status: Home / Self Care | Payer: Self-pay | Source: Ambulatory Visit | Attending: Family Medicine | Admitting: Family Medicine

## 2019-10-14 DIAGNOSIS — R918 Other nonspecific abnormal finding of lung field: Secondary | ICD-10-CM | POA: Insufficient documentation

## 2019-10-15 ENCOUNTER — Telehealth: Payer: Self-pay | Admitting: *Deleted

## 2019-10-15 NOTE — Telephone Encounter (Signed)
Pt is requesting results of CT. [name], CMA

## 2019-10-16 NOTE — Telephone Encounter (Signed)
Pt states that she is returning call.  [name], CMA

## 2019-10-16 NOTE — Telephone Encounter (Signed)
Discussed results with patient. Thank you!

## 2019-10-21 ENCOUNTER — Ambulatory Visit: Payer: Self-pay | Admitting: Pharmacist

## 2019-10-21 NOTE — Progress Notes (Signed)
Chronic Care Management   Initial Visit Note  10/21/2019 Name: [name] MRN: [id number] DOB: [birth year]  Referred by: Natalie Clan, DO Reason for referral : Chronic Care Management   [name] is a 53 y.o. year old female who is a primary care patient of Natalie Clan, DO. The CCM team was consulted for assistance with chronic disease management and care coordination needs related to COPD  Review of patient status, including review of consultants reports, relevant laboratory and other test results, and collaboration with appropriate care team members and the patient's provider was performed as part of comprehensive patient evaluation and provision of chronic care management services.    I spoke with [name] by telephone today.  Medications: Outpatient Encounter Medications as of 10/21/2019  Medication Sig  . albuterol (VENTOLIN HFA) 108 (90 Base) MCG/ACT inhaler Inhale 2 puffs into the lungs every 6 (six) hours as needed for wheezing or shortness of breath.  Marland Kitchen aspirin 81 MG chewable tablet Chew 81 mg by mouth daily.  Marland Kitchen atorvastatin (LIPITOR) 40 MG tablet Take 1 tablet (40 mg total) by mouth daily. Take 20mg  for the first week.  Marland Kitchen azithromycin (ZITHROMAX) 250 MG tablet Take 1 tablet (250 mg total) by mouth daily. Take two pills (500mg ) the first day. Then one daily, 5 days total  . baclofen (LIORESAL) 10 MG tablet Take 1 tablet (10 mg total) by mouth 3 (three) times daily as needed for muscle spasms.  . benzonatate (TESSALON) 200 MG capsule Take 1 capsule (200 mg total) by mouth 2 (two) times daily as needed for cough.  Marland Kitchen buPROPion (WELLBUTRIN SR) 150 MG 12 hr tablet Take 1 tablet (150 mg total) by mouth 2 (two) times daily.  Marland Kitchen gabapentin (NEURONTIN) 100 MG capsule Take 1 capsule (100 mg total) by mouth 3 (three) times daily.  Marland Kitchen MELATONIN PO Take 1 tablet by mouth at bedtime as needed (sleep).  . traMADol (ULTRAM) 50 MG tablet Take 1 tablet (50 mg total) by mouth  every 8 (eight) hours as needed.  . umeclidinium-vilanterol (ANORO ELLIPTA) 62.5-25 MCG/INH AEPB Inhale 1 puff into the lungs daily.  . [DISCONTINUED] ipratropium (ATROVENT HFA) 17 MCG/ACT inhaler Inhale 2 puffs into the lungs every 4 (four) hours as needed for wheezing.  . [DISCONTINUED] tiotropium (SPIRIVA) 18 MCG inhalation capsule Place 1 capsule (18 mcg total) into inhaler and inhale daily.   No facility-administered encounter medications on file as of 10/21/2019.     Objective:   Goals Addressed            This Visit's Progress     Patient Stated   . I'm unable to get my inhalers to control my breathing (pt-stated)       Current Barriers:  . Suspected COPD (unable to obtain PFTs at this time) . 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 Financial--pt currently using Anora samples via PCP  Pharmacist Clinical Goal(s):  Marland Kitchen Over the next 30 days, patient will work with PharmD and provider towards optimized medication management of COPD  Interventions: . Comprehensive medication review performed; medication list updated in electronic medical record . Inhaler therapy for asthma has transitioned to Anoro and Albuterol PRN due to financial issues.  Patient is tolerating well.  Denies difficulties using inhalers . Patient is still not able to get Spiriva through the health department yet due to waiting for IRS paperwork but absolutely needs a  controller inhaler.  If unable to obtain paperwork--will attempt Dulera via [employer] P[employer] (does not require financials) . 81-month supply of Anoro samples labeled for patient to pick up on 10/22/19.  Patient verbalizes understanding . Counseled pt to continuing using Anoro daily as maintenance.  PRN rescue . Continue current management: o Anoro  o Albuterol inhaler PRN o May consider broadening therapy to Trelegy per patient  response  Patient Self Care Activities:  . Patient will take medications as prescribed . Patient will focus on improved adherence by continuing to use inhalers as prescribed.  Avoiding triggers--cold, stay at home safe practices  Initial goal documentation         Plan:   The care management team will reach out to the patient again over the next 7 days.   Provider Signature Regina Eck, PharmD, BCPS Clinical Pharmacist, [place]: [phone]

## 2019-10-28 ENCOUNTER — Telehealth: Payer: Self-pay | Admitting: Pharmacist

## 2019-10-28 MED ORDER — TRELEGY ELLIPTA 200-62.5-25 MCG/INH IN AEPB: 1.0000 | INHALATION_SPRAY | Freq: Every day | RESPIRATORY_TRACT | 3 refills | Status: DC

## 2019-10-28 NOTE — Telephone Encounter (Signed)
Contacted patient to discuss option of switching from Anoro to Trelegy.    Patient reported she was continuing to use Albuterol routinely.  She noted that the bupropion plus Nicotine 14mg  patches have helped her to quit since early November.  I congratulated her on 2 months of quitting tobacco AND encouraged continued abstinence from smoking.  She verbalized 8/10 confidence in being a long-term ex-smoker.  She is willing to a trial of Trelegy Elipta.  I sent updated prescription for Trelegy 217mcg fluticasone/vilanterol/umeclidinium - 1 inhalation daily with additional refills to the [facility] program at the [facility].   Patient was instructed to utilize Anoro same dose for now THEN start Trelegy Elipta when it arrives. I asked her to contact us for Trelegy samples IF she runs our ot Anoro prior to Trelegy being available.   Contacted [facility] by phone and spoke with Reginal Lutes, PharmD to clarify Anoro/Trelegy switch.  She appreciated the update.

## 2019-11-05 ENCOUNTER — Encounter: Payer: Self-pay | Admitting: Family Medicine

## 2019-12-23 ENCOUNTER — Telehealth (INDEPENDENT_AMBULATORY_CARE_PROVIDER_SITE_OTHER): Payer: No Typology Code available for payment source | Admitting: Family Medicine

## 2019-12-23 ENCOUNTER — Encounter: Payer: Self-pay | Admitting: Family Medicine

## 2019-12-23 DIAGNOSIS — R058 Other specified cough: Secondary | ICD-10-CM

## 2019-12-23 DIAGNOSIS — R05 Cough: Secondary | ICD-10-CM

## 2019-12-23 DIAGNOSIS — R053 Chronic cough: Secondary | ICD-10-CM

## 2019-12-23 DIAGNOSIS — F411 Generalized anxiety disorder: Secondary | ICD-10-CM

## 2019-12-23 DIAGNOSIS — R918 Other nonspecific abnormal finding of lung field: Secondary | ICD-10-CM

## 2019-12-23 DIAGNOSIS — R9389 Abnormal findings on diagnostic imaging of other specified body structures: Secondary | ICD-10-CM

## 2019-12-23 DIAGNOSIS — F419 Anxiety disorder, unspecified: Secondary | ICD-10-CM

## 2019-12-23 MED ORDER — BUSPIRONE HCL 5 MG PO TABS: 5.0000 mg | ORAL_TABLET | Freq: Three times a day (TID) | ORAL | 0 refills | Status: DC | PRN

## 2019-12-23 MED ORDER — SERTRALINE HCL 50 MG PO TABS: 50.0000 mg | ORAL_TABLET | Freq: Every day | ORAL | 1 refills | Status: DC

## 2019-12-23 NOTE — Progress Notes (Signed)
[facility] Telemedicine Visit  Patient consented to have virtual visit. Method of visit: Telephone  Encounter participants: Patient: [name] - located at Home  Provider: Patriciaann Clan - located at [facility] Others (if applicable): None   Chief Complaint: Cough, fever, anxiety    HPI: [name] is a 54 year old female presenting via telephone discussed the following:  Cough: Reports feeling like she has a cold since Friday, 2/19.  States the whole family is sick right now with congestion and loss of taste. Her granddaughter got a rapid Covid test done today which was negative.  She reports nasal congestion, loss of taste, coughing with few episodes of posttussive emesis which is mostly phlegm, and a low-grade temperature of 100.4-100.33F.  Does not feel more short of breath compared to her baseline, however continues to notice a general decline of her dyspnea with exertion.  No associated sore throat, myalgias, abdominal pain/diarrhea.  No known Covid exposures that she is aware of.  She has been taking Advil with some relief.  Anxiety: Used to have this in younger years, stopped, but back in the last few months. Used to be on xanax due to panic attacks and didn't like it, felt it made her sleepy during the day. Having trouble sleeping, going to sleep around 10/11 and waking up at 2 AM with anxious thoughts. When around people too long, heart starts racing and feels like she can't breath. "Fight or flight" type of experience.  While she gets increased anxiety in social situations, also feeling it generalized.  Stressed out taking care of her mother who just had carotid vascular surgery and her grandchildren.  Exhausted by feeling like she has to take care of everyone in the family.  Also mentions the general state of the [place]/pandemic and her bills starting to pile up.  She has tried reading books, sleepy time tea, warm milk at night without relief.  Has also tried melatonin  with no effect.  No SI or HI.  Currently on Wellbutrin, initially started to help with tobacco cessation.  ROS: per HPI  Pertinent PMHx: Chronic cough> suspected COPD vs ILD, hyperlipidemia, tobacco use recently quit, recurrent low back pain  Exam:  Respiratory: Able to speak in full sentences, frequent wet sounding cough during conversation  Assessment/Plan:  Productive cough Evaluation per telephone, frequent coughing however fortunately with unlabored breathing during conversation.  Current clinical presentation suspicious for Covid infection, despite granddaughter's rapid antigen test negative and minimal social interactions.  Bacterial pneumonia still on the differential given fever and productive cough, however above more likely given symptoms and whole family with same concerns.  Will continue to consider ILD/COPD exacerbation, flu.  Will have patient get Covid testing done, pending results and clinical status, will consider starting antibiotics for CAP.  Supportive measures, hydration, Tylenol, tea with honey, Robitussin for cough. ED/urgent care precautions discussed including difficulty breathing, chest pain, confusion-however patient is often hesitant to consider these alternative options for evaluation.  I will give her a call later this week to check in.  GAD (generalized anxiety disorder) Also appears to have panic attacks with certain social situations, all in the setting of COVID pandemic and increased monetary/familial stressors. No associated SI or HI.  Currently on Wellbutrin XL to help with smoking cessation (quit for the last few months), unlikely to help as much for anxious symptoms.  Will slowly taper off of this and start low-dose Zoloft 25mg  for now.  In the meantime, given acute  episodes, will also have BuSpar on an as-needed basis only.  Common side effects discussed. Tapering schedule and therapy resources within the community sent via MyChart to patient.  Discussed  prioritizing self-care and stress reducing techniques.  Abnormal chest CT Concerning for ILD.  Will touch base with [name] on status of pulmonary referral.    Will follow up with her later this week, from there will likely follow-up in 2 weeks for above.  Case discussed with Dr. Gwendlyn Deutscher. Time spent during visit with patient: 60 minutes  Patriciaann Clan, DO  Family Medicine PGY-2

## 2019-12-25 ENCOUNTER — Ambulatory Visit: Payer: Medicaid Other | Attending: Internal Medicine

## 2019-12-25 DIAGNOSIS — R05 Cough: Secondary | ICD-10-CM

## 2019-12-25 DIAGNOSIS — Z20822 Contact with and (suspected) exposure to covid-19: Secondary | ICD-10-CM

## 2019-12-25 DIAGNOSIS — R058 Other specified cough: Secondary | ICD-10-CM | POA: Insufficient documentation

## 2019-12-25 HISTORY — DX: Cough: R05

## 2019-12-25 HISTORY — DX: Other specified cough: R05.8

## 2019-12-25 NOTE — Assessment & Plan Note (Addendum)
Evaluation per telephone, frequent coughing however fortunately with unlabored breathing during conversation.  Current clinical presentation suspicious for Covid infection, despite granddaughter's rapid antigen test negative and minimal social interactions.  Bacterial pneumonia still on the differential given fever and productive cough, however above more likely given symptoms and whole family with same concerns.  Will continue to consider ILD/COPD exacerbation, flu.  Will have patient get Covid testing done, pending results and clinical status, will consider starting antibiotics for CAP.  Supportive measures, hydration, Tylenol, tea with honey, Robitussin for cough. ED/urgent care precautions discussed including difficulty breathing, chest pain, confusion-however patient is often hesitant to consider these alternative options for evaluation.  I will give her a call later this week to check in.

## 2019-12-26 DIAGNOSIS — F411 Generalized anxiety disorder: Secondary | ICD-10-CM | POA: Insufficient documentation

## 2019-12-26 LAB — NOVEL CORONAVIRUS, NAA: SARS-CoV-2, NAA: NOT DETECTED

## 2019-12-26 NOTE — Assessment & Plan Note (Addendum)
Also appears to have panic attacks with certain social situations, all in the setting of COVID pandemic and increased monetary/familial stressors. No associated SI or HI.  Currently on Wellbutrin XL to help with smoking cessation (quit for the last few months), unlikely to help as much for anxious symptoms.  Will slowly taper off of this and start low-dose Zoloft 25mg  for now.  In the meantime, given acute episodes, will also have BuSpar on an as-needed basis only.  Common side effects discussed. Tapering schedule and therapy resources within the community sent via MyChart to patient.  Discussed prioritizing self-care and stress reducing techniques.

## 2019-12-26 NOTE — Assessment & Plan Note (Signed)
Concerning for ILD.  Will touch base with [name] on status of pulmonary referral.

## 2019-12-30 ENCOUNTER — Ambulatory Visit
Admission: RE | Admit: 2019-12-30 | Discharge: 2019-12-30 | Disposition: A | Discharge status: Home / Self Care | Payer: No Typology Code available for payment source | Source: Ambulatory Visit | Attending: Family Medicine | Admitting: Family Medicine

## 2019-12-30 ENCOUNTER — Other Ambulatory Visit: Payer: Self-pay | Admitting: Family Medicine

## 2019-12-30 ENCOUNTER — Other Ambulatory Visit: Payer: Self-pay

## 2019-12-30 DIAGNOSIS — J189 Pneumonia, unspecified organism: Secondary | ICD-10-CM

## 2019-12-30 DIAGNOSIS — R058 Other specified cough: Secondary | ICD-10-CM

## 2019-12-30 DIAGNOSIS — R05 Cough: Secondary | ICD-10-CM

## 2019-12-30 MED ORDER — AZITHROMYCIN 250 MG PO TABS: ORAL_TABLET | ORAL | 0 refills | Status: DC

## 2019-12-30 MED ORDER — AMOXICILLIN-POT CLAVULANATE 875-125 MG PO TABS: 1.0000 | ORAL_TABLET | Freq: Two times a day (BID) | ORAL | 0 refills | Status: AC

## 2019-12-30 NOTE — Progress Notes (Signed)
CXR consistent with PNA, called patient to discuss results. She is feeling similar, low risk to manage outpatient.   Sent in Augmentin + azithromycin for 5 days, with history of underlying lung disease. Recommended pt schedule a virtual follow up either at the end of the week or early next week. ED if SOB, confusion, CP, no improvement with abx in >48 hours.   Patriciaann Clan, DO

## 2019-12-30 NOTE — Progress Notes (Signed)
Late documentation, spoke with pt 3/1 afternoon.   Called patient to check in from last week's telephone visit. COVID came back negative. She is still febrile and frequently coughing, not feeling well, starting to have associated rib pain on the right. She has had a fever for over a week. Discussed she really should be seen for an in-person evaluation, however through our protocol she can not be seen within our clinic. She is not willing to be seen at the ED or at urgent care, states urgent care would not see in person due to her sx as well.   Through shared discussion, will place order for CXR for her to get this morning, 3/2, however understand this might not be an option as well. Absolute ED precautions discussed, including SOB, confusion, CP.   Patriciaann Clan, DO

## 2020-01-07 ENCOUNTER — Telehealth (INDEPENDENT_AMBULATORY_CARE_PROVIDER_SITE_OTHER): Payer: No Typology Code available for payment source | Admitting: Family Medicine

## 2020-01-07 ENCOUNTER — Encounter: Payer: Self-pay | Admitting: Family Medicine

## 2020-01-07 ENCOUNTER — Other Ambulatory Visit: Payer: Self-pay

## 2020-01-07 DIAGNOSIS — R202 Paresthesia of skin: Secondary | ICD-10-CM

## 2020-01-07 DIAGNOSIS — R911 Solitary pulmonary nodule: Secondary | ICD-10-CM

## 2020-01-07 DIAGNOSIS — J189 Pneumonia, unspecified organism: Secondary | ICD-10-CM

## 2020-01-07 DIAGNOSIS — Z659 Problem related to unspecified psychosocial circumstances: Secondary | ICD-10-CM

## 2020-01-07 DIAGNOSIS — F411 Generalized anxiety disorder: Secondary | ICD-10-CM

## 2020-01-07 DIAGNOSIS — Z87891 Personal history of nicotine dependence: Secondary | ICD-10-CM | POA: Insufficient documentation

## 2020-01-07 DIAGNOSIS — E785 Hyperlipidemia, unspecified: Secondary | ICD-10-CM

## 2020-01-07 HISTORY — DX: Pneumonia, unspecified organism: J18.9

## 2020-01-07 NOTE — Progress Notes (Signed)
[place] Telemedicine Visit  Patient consented to have virtual visit. Method of visit: Telephone does not have access to video.   Encounter participants: Patient: [name] - located at home  Provider: Patriciaann Clan - located at [facility] Others (if applicable): None   Chief Complaint: Follow-up pneumonia/anxiety   HPI: [name] is a 54 year old female presenting via video for pneumonia/anxiety follow-up.  Recently diagnosed with LLL CAP on 3/2 after several day history of productive cough and fever with CXR showing infiltrate, prescribed Augmentin and azithromycin for 5 days.  Additionally, on 2/26 she was started on a low-dose Zoloft while she de-escalating her Wellbutrin for anxiety.  Also given BuSpar as needed for episodes as Zoloft takes full effect.  Today, she is doing a lot better than previous. Still has a cough, but not as bad. No more fever since last week. Still using honey and hot showers to help, working some.  She also reports that BuSpar has worked quite well when she is feeling anxious.  She has used it 4 times since prescribed 2 weeks ago.  She is not sure if Zoloft is any making any impact yet, currently at 50 mg.  She is now taking Wellbutrin once a day.   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  Completed 5-day course of antibiotics, significant improvement since that time.  Still with lingering dry cough which is her general baseline and will likely last for several weeks.  Discussed continued use of tea with honey, warm showers, and staying hydrated.  GAD (generalized anxiety disorder) Improving with BuSpar, will likely need several more weeks to achieve peak Zoloft effect.  Instructed to  continue weaning Wellbutrin until completely off.  Discussed starting meditation and yoga via YouTube/apps on her phone.  Also provided with therapy resources in the community that do not require insurance as she would like to establish care.    Time spent during visit with patient: 14 minutes  Follow-up in 3 weeks to discuss anxiety or sooner if needed.  Patriciaann Clan, DO  Family medicine PGY-2

## 2020-01-07 NOTE — Assessment & Plan Note (Addendum)
Improving with BuSpar, will likely need several more weeks to achieve peak Zoloft effect.  Instructed to continue weaning Wellbutrin until completely off.  Discussed starting meditation and yoga via YouTube/apps on her phone.  Also provided with therapy resources in the community that do not require insurance as she would like to establish care.

## 2020-01-07 NOTE — Assessment & Plan Note (Signed)
Diagnosed on 3/2.  Completed 5-day course of antibiotics, significant improvement since that time.  Still with lingering dry cough which is her general baseline and will likely last for several weeks.  Discussed continued use of tea with honey, warm showers, and staying hydrated.

## 2020-01-15 ENCOUNTER — Other Ambulatory Visit: Payer: Self-pay | Admitting: Family Medicine

## 2020-01-15 DIAGNOSIS — F419 Anxiety disorder, unspecified: Secondary | ICD-10-CM

## 2020-02-22 ENCOUNTER — Other Ambulatory Visit: Payer: Self-pay | Admitting: Family Medicine

## 2020-02-22 DIAGNOSIS — F419 Anxiety disorder, unspecified: Secondary | ICD-10-CM

## 2020-02-25 ENCOUNTER — Other Ambulatory Visit: Payer: Self-pay | Admitting: Family Medicine

## 2020-02-25 DIAGNOSIS — F419 Anxiety disorder, unspecified: Secondary | ICD-10-CM

## 2020-04-19 ENCOUNTER — Other Ambulatory Visit: Payer: Self-pay

## 2020-04-19 DIAGNOSIS — F419 Anxiety disorder, unspecified: Secondary | ICD-10-CM

## 2020-04-19 MED ORDER — SERTRALINE HCL 50 MG PO TABS: 50.0000 mg | ORAL_TABLET | Freq: Every day | ORAL | 3 refills | Status: DC

## 2020-04-20 ENCOUNTER — Other Ambulatory Visit: Payer: Self-pay | Admitting: *Deleted

## 2020-04-20 DIAGNOSIS — F419 Anxiety disorder, unspecified: Secondary | ICD-10-CM

## 2020-04-26 ENCOUNTER — Telehealth: Payer: Self-pay

## 2020-04-26 NOTE — Telephone Encounter (Signed)
Patient calls nurse line reporting "issues with my lungs again." Patient reports Dr. Higinio Plan knows all about it and she usually send me in antibiotics. Patient would elaborate on current symptoms, just "a crackling in my throat." I see where Augmentin and Azithro was called in back in March. Patient does not have health insurance at this time and can not afford to come in. If appropriate could this be recalled in? I could also do a virtual "telephone" call. Please advise.

## 2020-04-28 ENCOUNTER — Other Ambulatory Visit: Payer: Self-pay

## 2020-04-28 DIAGNOSIS — F419 Anxiety disorder, unspecified: Secondary | ICD-10-CM

## 2020-04-28 MED ORDER — BUSPIRONE HCL 10 MG PO TABS: ORAL_TABLET | ORAL | 1 refills | Status: DC

## 2020-04-28 NOTE — Telephone Encounter (Signed)
Called to touch base with [name] in reference to recent telephone call.  No answer, left voicemail to call back to the clinic.  If she calls back, please schedule her as a telephone visit in the next 1-2 days preferably.    She often has exacerbations of her lung disease, suspected obstructive with chronic tobacco use (recently quit), however she has never been able to obtain PFTs due to cost.  Recently had pneumonia back in March.  Would not be surprised if she requires another course of steroids and antibiotics depending on her current symptoms.  Patriciaann Clan, DO

## 2020-04-28 NOTE — Progress Notes (Signed)
[place] Telemedicine Visit  I connected with [name] on 04/29/20 at  9:30 AM EDT by a video enabled telemedicine application and verified that I am speaking with the correct person using two identifiers.     I discussed the limitations of evaluation and management by telemedicine and the availability of in person appointments.  I discussed that the purpose of this telehealth visit is to provide medical care while limiting exposure to the novel coronavirus.  The mother and patient expressed understanding and agreed to proceed.  Patient consented to have virtual visit. Method of visit: Video  Encounter participants: Patient: [name] - located at Home Provider: Danna Hefty - located at [facility] Others (if applicable): None  Chief Complaint: "Lung problem"  HPI: Patient notes that she is having left posterior rib pain x 3 days, notes it is located along shoulder blades and ribs. Endorses pain with deep breaths and when she places pressure on it. Patient notes she has a chronic cough that has been getting worse, particularly at night. She notes she feels more congested and her test is tighter. She notes the cough is productive but isnt able to get anything actually out. She notes that she is having more SOB and feels like she has to bend over to breath well. Denies any recent surgeries, clotting disorders, or sedentary lifestyle. Denies fevers, chills, chest pain. She notes she has been using the Trelegy daily however she ran out of it yesterday. Has been using Albuterol 3 times a day for the last 3 days. She notes improvement in her symptoms temporarily.   O2 Sat 92%, HR 65, BP: 119/60.  ROS: per HPI  Pertinent PMHx: h/o CAP, abnormal chest CT in 2020 showing smoking-related interstitial lung disease changes, history of tobacco use, solitary lung nodule on CT in 2020, resolved on repeat CT in 2021, mild mitral valve prolapse, recurrent nonproductive  cough  Exam:  Respiratory: speaking in full sentences, breathing comfortably on room air MSK: no pain to palpation of rib cage  Assessment/Plan:  COPD exacerbation (HCC) Acute worsening of chronic productive cough and worsening dyspnea for the last 3 days requiring increased use of rescue inhaler.  Symptoms appear most consistent with acute COPD exacerbation.  Patient has been afebrile which is reassuring, however her complaint of pleuritic posterior rib pain that is not reproducible to palpation is also concerning for an underlying pulmonary disease (such as PE, CAP, malignancy). Will obtain chest x-ray to further evaluate. She remains hemodynamically stable without labored breathing on exam which is also reassuring. Unable to rule out PE thus will have patient come in tomorrow to [facility] clinic for a more thourough physical exam, can consider obtaining D-dimer at that time. She has significant financial stressors and does not currently have health insurance or the orange card.  Patient has been using Trelegy samples from [facility] and has recently run out.  Patient will need to obtain more samples at her follow-up visit on 04/30/20. -Zithromax 500 mg for day 1, 250 mg for the next 4 -Prednisone 40 mg x 5 days - patient to obtain chest x-ray ASAP -Scheduled albuterol inhaler every 4 hours x24 hours, then as needed -Patient to pick up more samples of Trelegy or Breo at follow-up visit on 7/2 -Encourage supportive measures such as staying hydrated, honey for her cough, cough drops -Strict ED return precautions were discussed including worsening shortness of breath, hypoxia of less than 88%, unable to stay hydrated, hemoptysis  I discussed the assessment and treatment plan with the patient and/or parent/guardian. They were provided an opportunity to ask questions and all were answered. They agreed with the plan and demonstrated an understanding of the instructions.   They were advised to call back or seek  an in-person evaluation in the emergency room if the symptoms worsen or if the condition fails to improve as anticipated.  Total time: 35 minutes. This includes time spent with the patient during the visit as well as time spent before and after the visit reviewing the chart, documenting the encounter, making phone calls, reviewing studies, etc.  Danna Hefty, [place], PGY2 04/29/2020 1:35 PM

## 2020-04-29 ENCOUNTER — Telehealth (INDEPENDENT_AMBULATORY_CARE_PROVIDER_SITE_OTHER): Payer: No Typology Code available for payment source | Admitting: Family Medicine

## 2020-04-29 DIAGNOSIS — R0781 Pleurodynia: Secondary | ICD-10-CM

## 2020-04-29 DIAGNOSIS — J189 Pneumonia, unspecified organism: Secondary | ICD-10-CM

## 2020-04-29 DIAGNOSIS — J441 Chronic obstructive pulmonary disease with (acute) exacerbation: Secondary | ICD-10-CM

## 2020-04-29 MED ORDER — AZITHROMYCIN 250 MG PO TABS: ORAL_TABLET | ORAL | 0 refills | Status: DC

## 2020-04-29 MED ORDER — PREDNISONE 20 MG PO TABS: 40.0000 mg | ORAL_TABLET | Freq: Every day | ORAL | 0 refills | Status: AC

## 2020-04-29 NOTE — Assessment & Plan Note (Signed)
Acute worsening of chronic productive cough and worsening dyspnea for the last 3 days requiring increased use of rescue inhaler.  Symptoms appear most consistent with acute COPD exacerbation.  Patient has been afebrile which is reassuring, however her complaint of pleuritic posterior rib pain that is not reproducible to palpation is also concerning for an underlying pulmonary disease (such as PE, CAP, malignancy). Will obtain chest x-ray to further evaluate. She remains hemodynamically stable without labored breathing on exam which is also reassuring. Unable to rule out PE thus will have patient come in tomorrow to [facility] clinic for a more thourough physical exam, can consider obtaining D-dimer at that time. She has significant financial stressors and does not currently have health insurance or the orange card.  Patient has been using Trelegy samples from [facility] and has recently run out.  Patient will need to obtain more samples at her follow-up visit on 04/30/20. -Zithromax 500 mg for day 1, 250 mg for the next 4 -Prednisone 40 mg x 5 days - patient to obtain chest x-ray ASAP -Scheduled albuterol inhaler every 4 hours x24 hours, then as needed -Patient to pick up more samples of Trelegy or Breo at follow-up visit on 7/2 -Encourage supportive measures such as staying hydrated, honey for her cough, cough drops -Strict ED return precautions were discussed including worsening shortness of breath, hypoxia of less than 88%, unable to stay hydrated, hemoptysis

## 2020-04-30 ENCOUNTER — Ambulatory Visit (INDEPENDENT_AMBULATORY_CARE_PROVIDER_SITE_OTHER): Payer: No Typology Code available for payment source | Admitting: Family Medicine

## 2020-04-30 ENCOUNTER — Other Ambulatory Visit: Payer: Self-pay

## 2020-04-30 ENCOUNTER — Ambulatory Visit
Admission: RE | Admit: 2020-04-30 | Discharge: 2020-04-30 | Disposition: A | Discharge status: Home / Self Care | Payer: Self-pay | Source: Ambulatory Visit | Attending: Family Medicine | Admitting: Family Medicine

## 2020-04-30 VITALS — BP 116/74 | HR 60 | Temp 98.6°F

## 2020-04-30 DIAGNOSIS — Z659 Problem related to unspecified psychosocial circumstances: Secondary | ICD-10-CM

## 2020-04-30 DIAGNOSIS — R0781 Pleurodynia: Secondary | ICD-10-CM

## 2020-04-30 DIAGNOSIS — J189 Pneumonia, unspecified organism: Secondary | ICD-10-CM

## 2020-04-30 DIAGNOSIS — R0602 Shortness of breath: Secondary | ICD-10-CM

## 2020-04-30 DIAGNOSIS — R05 Cough: Secondary | ICD-10-CM

## 2020-04-30 DIAGNOSIS — J441 Chronic obstructive pulmonary disease with (acute) exacerbation: Secondary | ICD-10-CM

## 2020-04-30 DIAGNOSIS — R053 Chronic cough: Secondary | ICD-10-CM

## 2020-04-30 MED ORDER — CEFDINIR 300 MG PO CAPS: 300.0000 mg | ORAL_CAPSULE | Freq: Two times a day (BID) | ORAL | 0 refills | Status: AC

## 2020-04-30 MED ORDER — DOXYCYCLINE HYCLATE 100 MG PO TABS
100.0000 mg | ORAL_TABLET | Freq: Two times a day (BID) | ORAL | 0 refills | Status: DC
Start: 2020-04-30 — End: 2020-04-30

## 2020-04-30 MED ORDER — TRELEGY ELLIPTA 200-62.5-25 MCG/INH IN AEPB: 1.0000 | INHALATION_SPRAY | Freq: Every day | RESPIRATORY_TRACT | 3 refills | Status: DC

## 2020-04-30 MED ORDER — FLUCONAZOLE 150 MG PO TABS
150.0000 mg | ORAL_TABLET | Freq: Once | ORAL | 0 refills | Status: AC
Start: 2020-04-30 — End: 2020-04-30

## 2020-04-30 NOTE — Progress Notes (Signed)
    SUBJECTIVE:   CHIEF COMPLAINT / HPI:   CC: cough, lung problem   [name] is a 54 y.o. female here for follow-up.  Patient was seen for similar complaints yesterday and was referred to the clinic further evaluation.  Shortness of breath Patient reports worsening rib pain and shoulder pain for the past 4 days left greater than right.  Has had a nonproductive cough that is worsened.  Denies hemoptysis, congestion, sore throat, chills, body aches, fever.  Endorses shortness of breath.  Had ran out of her Trelegy inhaler.  Has been having to use her albuterol inhaler 3-4 times a day.  States that her shortness of breath has been worse with walking.  She is unable to walk to the kitchen.  States that symptoms are better when she leans forward.  Started taking prednisone and azithromycin as prescribed by Dr. Tarry Kos today.  Had a chest x-ray performed this morning.    PERTINENT  PMH / PSH: COPD, hx of CAP, chronic cough, solitary lung nodule    OBJECTIVE:   BP 116/74   Pulse 60   Temp 98.6 F (37 C) (Oral)   SpO2 95%   GEN: Well-nourished, well-developed female, in no respiratory distress CV: regular rate and rhythm, no murmurs appreciated RESP: no increased work of breathing, diffuse rhonchi, occasional expiratory wheezing no rales, no retractions, no tugging, no nasal flaring, intermittent coughing present, speaking in full sentences without pause ABD: Bowel sounds present. Soft, Nontender, Nondistended.  MSK: No palpable tenderness to the posterior or anterior ribs, thin extremities, SKIN: warm, dry, well-perfused NEURO: Alert, oriented, moves all extremities appropriately     ASSESSMENT/PLAN:   Dyspnea Continue prednisone and azithromycin as prescribed by Dr. Tarry Kos.  For COPD exacerbation. -Trelegy samples given to patient in office. -Follow-up on chest x-ray -Start cefdinir 300 mg twice daily for 5 days to cover for community-acquired pneumonia -ED precautions given,  follow-up if symptoms not improving in 1 week -CCM referral placed for medication management is unable to afford medications.     Lyndee Hensen, [place]

## 2020-04-30 NOTE — Patient Instructions (Signed)
It was great seeing you today!   I'd like to see you back if your symptoms worsen.   - Stop by the pharmacy to retrieve your antibiotics.  - Be sure to take your inhalers as prescribed   - Only take the Diflucan if you have symptoms of a yeast infection    If you have questions or concerns please do not hesitate to call at [phone].  Dr. Rushie [facility]

## 2020-05-07 DIAGNOSIS — R0600 Dyspnea, unspecified: Secondary | ICD-10-CM | POA: Insufficient documentation

## 2020-05-07 NOTE — Assessment & Plan Note (Addendum)
Continue prednisone and azithromycin as prescribed by Dr. Tarry Kos.  For COPD exacerbation. -Trelegy samples given to patient in office. -Follow-up on chest x-ray -Start cefdinir 300 mg twice daily for 5 days to cover for community-acquired pneumonia -ED precautions given, follow-up if symptoms not improving in 1 week -CCM referral placed for medication management is unable to afford medications.

## 2020-05-21 ENCOUNTER — Other Ambulatory Visit: Payer: Self-pay | Admitting: Family Medicine

## 2020-05-21 ENCOUNTER — Telehealth: Payer: Self-pay

## 2020-05-21 DIAGNOSIS — F419 Anxiety disorder, unspecified: Secondary | ICD-10-CM

## 2020-05-21 NOTE — Telephone Encounter (Signed)
Medication Samples have been provided to the patient per Dr. Valentina Lucks.   Drug name: Trelegy     Strength: 200   Qty: 2 LOT: [id number] Exp.Date: 07/29/2021  Dosing instructions: Inhale 1 puff into the lungs daily  The patient has been instructed regarding the correct time, dose, and frequency of taking this medication, including desired effects and most common side effects. Patient instructed to schedule follow up visit with PCP.   [name] 3:53 PM 05/21/2020

## 2020-05-25 ENCOUNTER — Encounter: Payer: Self-pay | Admitting: Family Medicine

## 2020-05-25 ENCOUNTER — Telehealth: Payer: Self-pay

## 2020-05-25 NOTE — Telephone Encounter (Signed)
Patient calls nurse line reporting UTI symptoms, possible kidney stones. Patient reports 10/10 pain in lower back and while urinating. Patient reports blood every time she goes to the bathroom. Patient requesting medication from PCP. I advised patient to go to ED or urgent care, given severity of pain and symptoms. Patient agreed with plan and will FU with updates.

## 2020-05-26 ENCOUNTER — Other Ambulatory Visit: Payer: Self-pay | Admitting: Family Medicine

## 2020-05-26 DIAGNOSIS — R202 Paresthesia of skin: Secondary | ICD-10-CM

## 2020-05-26 DIAGNOSIS — F419 Anxiety disorder, unspecified: Secondary | ICD-10-CM

## 2020-05-27 ENCOUNTER — Telehealth: Payer: Self-pay | Admitting: Family Medicine

## 2020-05-27 ENCOUNTER — Other Ambulatory Visit: Payer: Self-pay | Admitting: Family Medicine

## 2020-05-27 DIAGNOSIS — N200 Calculus of kidney: Secondary | ICD-10-CM

## 2020-05-27 MED ORDER — TAMSULOSIN HCL 0.4 MG PO CAPS: 0.4000 mg | ORAL_CAPSULE | Freq: Every day | ORAL | 0 refills | Status: DC

## 2020-05-27 MED ORDER — HYDROCODONE-ACETAMINOPHEN 5-325 MG PO TABS: 1.0000 | ORAL_TABLET | Freq: Four times a day (QID) | ORAL | 0 refills | Status: DC | PRN

## 2020-05-27 NOTE — Telephone Encounter (Signed)
Called patient in regards to urinary concerns. She had called into our clinic and spoke with our RN team who appropriately recommended an ED evaluation due to severe flank pain and hematuria.   In discussion, she reports the symptoms have been present since Friday, 7/23. Reports a long-term history of recurrent kidney stones. She is still in a lot of pain, left-sided flank pain that radiates into her pelvic area. The pain has migrated further down since onset. Hematuria still present with urinary frequency, no dysuria or fever. Pain is colic like, not constant. She has been trying full dose Tylenol and ibuprofen with no relief. She already has a strainer at home and has been using this. Been drinking plenty of water.  She reports she is unable to go to the ED as her car is broken down and she has no one that can take her. She does not want to use any public transportation. Has an appointment scheduled with myself on 8/3, declined moving this appointment up for evaluation/labs in hopes to have her car fixed by then.  Likely renal stone. Emphasized my recommendation is still the ED especially given duration of time, would benefit from evaluation of size if requires urological intervention. However, in the meantime will send a short course of Norco and Flomax to [place], DO

## 2020-06-01 ENCOUNTER — Other Ambulatory Visit: Payer: Self-pay

## 2020-06-01 ENCOUNTER — Telehealth (INDEPENDENT_AMBULATORY_CARE_PROVIDER_SITE_OTHER): Payer: No Typology Code available for payment source | Admitting: Family Medicine

## 2020-06-01 ENCOUNTER — Encounter: Payer: Self-pay | Admitting: Family Medicine

## 2020-06-01 DIAGNOSIS — Z659 Problem related to unspecified psychosocial circumstances: Secondary | ICD-10-CM

## 2020-06-01 DIAGNOSIS — Z2821 Immunization not carried out because of patient refusal: Secondary | ICD-10-CM | POA: Insufficient documentation

## 2020-06-01 DIAGNOSIS — F411 Generalized anxiety disorder: Secondary | ICD-10-CM

## 2020-06-01 DIAGNOSIS — N200 Calculus of kidney: Secondary | ICD-10-CM

## 2020-06-01 DIAGNOSIS — J449 Chronic obstructive pulmonary disease, unspecified: Secondary | ICD-10-CM | POA: Insufficient documentation

## 2020-06-01 HISTORY — DX: Calculus of kidney: N20.0

## 2020-06-01 NOTE — Assessment & Plan Note (Signed)
Resolved, passed yesterday morning.  Instructed to discontinue remainder of Flomax.  Encouraged appropriate hydration.

## 2020-06-01 NOTE — Assessment & Plan Note (Signed)
Presumed, no previous PFTs.  Doing well with Trelegy, chronic cough remaining.  Unfortunately have not been able to get her to pulmonology due to social stressors, patient to let me know as soon as she gets her orange card/financial aid back and will refer again.

## 2020-06-01 NOTE — Assessment & Plan Note (Signed)
Well-controlled with Zoloft 50 mg and BuSpar as needed.

## 2020-06-01 NOTE — Progress Notes (Signed)
[place] Telemedicine Visit  Patient consented to have virtual visit and was identified by name and date of birth. Method of visit: Telephone, does not have access to video   Encounter participants: Patient: [name] - located at home Provider: Patriciaann Clan - located at [facility] Others (if applicable): none   Chief Complaint: Kidney stone follow-up/COPD  HPI: [name] is a 54 year old female presenting via phone for follow-up.  Her car is still broken so she made a virtual appointment.  States she passed her kidney stone yesterday morning, saw it in the toilet.  No longer having any flank pain or hematuria.  Otherwise now feels like she is doing well.  Unfortunately says her sister had a stroke last night down in [place], presented with left-sided weakness and thankfully was able to get TPA in time.  She is doing okay from her breathing standpoint.  Feels better after being treated with azithromycin early this month.  Still has a lingering cough.  Trelegy works well for her.  Not interested in the Covid vaccine for now due to concern for developing blood clots.  Feels her anxiety is well controlled with Zoloft and as needed BuSpar.  Still does not have her orange card or financial aid back yet.  Waiting for IRS paperwork.  ROS: per HPI  Pertinent PMHx: Presumed COPD, migraines, anxiety, previous smoker, poor dentition  Exam:  There were no vitals taken for this visit.  Respiratory: Able to speak in full sentences during conversation, occasional dry cough  Assessment/Plan:  COPD (chronic obstructive pulmonary disease) ([place]) Presumed, no previous PFTs.  Doing well with Trelegy, chronic cough remaining.  Unfortunately have not been able to get her to pulmonology due to social stressors, patient to let me know as soon as she gets her orange card/financial aid back and will refer again.  GAD (generalized anxiety disorder) Well-controlled with Zoloft 50 mg  and BuSpar as needed.  Renal stone Resolved, passed yesterday morning.  Instructed to discontinue remainder of Flomax.  Encouraged appropriate hydration.  COVID-19 virus vaccination declined Recommended Covid vaccination especially with her baseline respiratory difficulty.  Provided supportive listening and alleviated blood clot concern.  Let her know if she would like to get her Covid vaccine, that she can schedule an appointment within our clinic for the [place] at any time.    Follow-up in 3 months or sooner if needed.  Time spent during visit with patient: 11 minutes  Patriciaann Clan, DO

## 2020-06-01 NOTE — Assessment & Plan Note (Signed)
Recommended Covid vaccination especially with her baseline respiratory difficulty.  Provided supportive listening and alleviated blood clot concern.  Let her know if she would like to get her Covid vaccine, that she can schedule an appointment within our clinic for the [place] at any time.

## 2020-07-02 ENCOUNTER — Telehealth: Payer: Self-pay

## 2020-07-02 NOTE — Telephone Encounter (Signed)
Patient calls nurse line regarding low back, hip and leg pain. Patient reports lifting 40+ pound grandchild this morning. Patient reports "disc popping out". Patient denies bowel or bladder incontinence. No available appointments today for evaluation. Advised patient that she could be evaluated in UC over the weekend or schedule with office early next week. Patient declined these options at this time due to not having health insurance.   Will send to PCP for next steps.   ED precautions given.   Talbot Grumbling, RN

## 2020-07-06 NOTE — Telephone Encounter (Signed)
Patient LVM on nurse line returning call to Dr. Higinio Plan.   Talbot Grumbling, RN

## 2020-07-06 NOTE — Telephone Encounter (Signed)
Attempted to reach patient to discuss. No answer and left voicemail.  Patriciaann Clan, DO

## 2020-07-18 ENCOUNTER — Other Ambulatory Visit: Payer: Self-pay | Admitting: Family Medicine

## 2020-07-18 DIAGNOSIS — F419 Anxiety disorder, unspecified: Secondary | ICD-10-CM

## 2020-07-18 NOTE — Telephone Encounter (Signed)
Reached patient to discuss concerns. She is still having low back pain with paresthesias to posterior legs bilaterally. No bowel/bladder involvement, numbness, or extremity weakness. In a lot of pain, using heat, warms baths, tylenol, and ibuprofen.   Discussed scheduled OV this week, I should have one slot open on Wednesday however setting up appointments was not working in [employer] during our conversation. Pt to call clinic tomorrow am. Can discuss any additional pain medication at that time. Would like to proceed with formal physical therapy given such frequent recurrence of back concerns and likely sports med/ortho in the future when she has insurance/coverage.   Patriciaann Clan, DO

## 2020-07-21 ENCOUNTER — Ambulatory Visit (INDEPENDENT_AMBULATORY_CARE_PROVIDER_SITE_OTHER): Payer: No Typology Code available for payment source | Admitting: Family Medicine

## 2020-07-21 ENCOUNTER — Encounter: Payer: Self-pay | Admitting: Family Medicine

## 2020-07-21 ENCOUNTER — Other Ambulatory Visit: Payer: Self-pay

## 2020-07-21 VITALS — BP 108/64 | HR 68 | Ht 60.0 in | Wt 99.6 lb

## 2020-07-21 DIAGNOSIS — Z659 Problem related to unspecified psychosocial circumstances: Secondary | ICD-10-CM

## 2020-07-21 DIAGNOSIS — J449 Chronic obstructive pulmonary disease, unspecified: Secondary | ICD-10-CM

## 2020-07-21 DIAGNOSIS — J441 Chronic obstructive pulmonary disease with (acute) exacerbation: Secondary | ICD-10-CM

## 2020-07-21 DIAGNOSIS — M545 Low back pain, unspecified: Secondary | ICD-10-CM

## 2020-07-21 DIAGNOSIS — M79604 Pain in right leg: Secondary | ICD-10-CM

## 2020-07-21 MED ORDER — MELOXICAM 15 MG PO TABS: 15.0000 mg | ORAL_TABLET | Freq: Every day | ORAL | 0 refills | Status: AC

## 2020-07-21 MED ORDER — PREDNISONE 20 MG PO TABS: 40.0000 mg | ORAL_TABLET | Freq: Every day | ORAL | 0 refills | Status: AC

## 2020-07-21 MED ORDER — CYCLOBENZAPRINE HCL 10 MG PO TABS: 10.0000 mg | ORAL_TABLET | Freq: Three times a day (TID) | ORAL | 0 refills | Status: DC | PRN

## 2020-07-21 NOTE — Progress Notes (Signed)
    SUBJECTIVE:   CHIEF COMPLAINT / HPI: Back/hip pain   [name] is a 54 year old female presenting discussed the following:  Low back pain: Present for the past 2 weeks, occurred after her 73-year-old grandson suddenly jumped into her arms.  Present bilateral low back with shooting pains down her buttocks/posterior legs to approximately knee level bilaterally.  Paresthesias but not numb.  Sitting forward makes it better, leaning backwards worse.  She has been using full dose Tylenol and ibuprofen with little relief.  Denies any lower extremity weakness, changes in bowel/bladder function, or saddle anesthesia.   PERTINENT  PMH / PSH: Presumed COPD, migraines, anxiety, previous smoker, poor dentition  OBJECTIVE:   BP 108/64   Pulse 68   Ht 5' (1.524 m)   Wt 99 lb 9.6 oz (45.2 kg)   SpO2 94%   BMI 19.45 kg/m   General: Alert, appears uncomfortable with sitting HEENT: NCAT Cardiac: RRR  Lungs: Rhonchorous breath sounds throughout all posterior lung fields with expiration, prolonged expiratory phase Ext: Warm, dry, 2+ distal pulses Lumbar spine: - Inspection: no gross deformity or asymmetry, swelling or ecchymosis - Palpation: TTP over bilateral paraspinal muscles and piriformis.  No TTP over lumbar spinous processes.  No spinous process step-off. - ROM: limited active ROM of the lumbar spine in flexion and extension due to pain, however preserved passive. - Strength: 5/5 strength of lower extremity in L4-S1 nerve root distributions b/l; slowed gait - Neuro: 2+ L4 reflexes - Special testing: Negative straight leg raise b/l  ASSESSMENT/PLAN:   Low back pain radiating to both legs Acute, recurrent.  Unclear etiology with atypical distribution making localized lumbar radiculopathy less likely, could consider muscular spasms with associated sciatica.  No additional s/sx concerning for cauda equina.  Given increase severity with spinal extension, considered spinal stenosis however  reassured by recent MRI lumbar in 07/2019 without any significant stenosis or disc/vertebrae abnormalities.  Rx'd Flexeril and short course of meloxicam.  Encouraged rest, heat.   COPD (chronic obstructive pulmonary disease) ([place]) Rhonchorous throughout with frequent coughing which she reports is at her baseline.  Reassuringly afebrile and nonproductive cough, suggesting against pneumonia and Covid without any known exposures.  Will Rx prednisone course to improve respiratory status, may additionally help back pain as above.  Needs Trelegy refill, however still does not have orange card/financial aid.  Will consult CCM/pharmacy for additional assistance.    Follow-up in 1 week for above or sooner if any worsening paresthesias, weakness, difficulty breathing, bowel/bladder changes, or saddle anesthesia.  [name], [place]

## 2020-07-21 NOTE — Patient Instructions (Signed)
It was wonderful to see you today.  I am so sorry that you continue to have recurrent struggles.  For your back pain and for your breathing, we are going to do a course of steroids.  I have also sent in a muscle relaxer to help with your back, this medication can make you drowsy so please do not drive.  Lastly, I have sent in a stronger pain/anti-inflammatory medicine that you can take on a daily basis, do not use with ibuprofen, Aleve, or Motrin.  You can continue to take Tylenol up to 4000 mg daily.  I encourage you to relook for insurance, I will put in referral again to her chronic care management to see if they can help you with resources so that we can continue forward with your care.

## 2020-07-23 ENCOUNTER — Other Ambulatory Visit: Payer: Self-pay | Admitting: Family Medicine

## 2020-07-23 DIAGNOSIS — F419 Anxiety disorder, unspecified: Secondary | ICD-10-CM

## 2020-07-25 ENCOUNTER — Encounter: Payer: Self-pay | Admitting: Family Medicine

## 2020-07-25 DIAGNOSIS — M545 Low back pain, unspecified: Secondary | ICD-10-CM | POA: Insufficient documentation

## 2020-07-25 DIAGNOSIS — M79604 Pain in right leg: Secondary | ICD-10-CM | POA: Insufficient documentation

## 2020-07-25 DIAGNOSIS — M79605 Pain in left leg: Secondary | ICD-10-CM

## 2020-07-25 HISTORY — DX: Pain in right leg: M79.604

## 2020-07-25 HISTORY — DX: Pain in left leg: M79.605

## 2020-07-25 HISTORY — DX: Low back pain, unspecified: M54.50

## 2020-07-25 NOTE — Assessment & Plan Note (Signed)
Acute, recurrent.  Unclear etiology with atypical distribution making localized lumbar radiculopathy less likely, could consider muscular spasms with associated sciatica.  No additional s/sx concerning for cauda equina.  Given increase severity with spinal extension, considered spinal stenosis however reassured by recent MRI lumbar in 07/2019 without any significant stenosis or disc/vertebrae abnormalities.  Rx'd Flexeril and short course of meloxicam.  Encouraged rest, heat.

## 2020-07-25 NOTE — Assessment & Plan Note (Signed)
Rhonchorous throughout with frequent coughing which she reports is at her baseline.  Reassuringly afebrile and nonproductive cough, suggesting against pneumonia and Covid without any known exposures.  Will Rx prednisone course to improve respiratory status, may additionally help back pain as above.  Needs Trelegy refill, however still does not have orange card/financial aid.  Will consult CCM/pharmacy for additional assistance.

## 2020-07-26 ENCOUNTER — Telehealth: Payer: Self-pay | Admitting: *Deleted

## 2020-07-26 NOTE — Chronic Care Management (AMB) (Signed)
  Care Management   Outreach Note  07/26/2020 Name: [name] MRN: [id number] DOB: [birth year]  [name] is a 54 y.o. year old female who is a primary care patient of Patriciaann Clan, DO. I reached out to [name] by phone today in response to a referral sent by [name]'s PCP, Patriciaann Clan, DO.     An unsuccessful telephone outreach was attempted today. The patient was referred to the case management team for assistance with care management and care coordination.   Follow Up Plan: The care management team will reach out to the patient again over the next 7 days.  If patient returns call to provider office, please advise to call [place] at [phone].  [place] Management

## 2020-07-29 ENCOUNTER — Telehealth: Payer: Self-pay | Admitting: Pharmacist

## 2020-07-29 NOTE — Telephone Encounter (Signed)
Called patient to discuss Trelegy Ellipta inhaler samples. No answer, left HIPAA discrete voice mail requesting patient call [facility] back.  Fara Olden, PharmD PGY-1 Ambulatory Care Pharmacy Resident

## 2020-07-30 NOTE — Telephone Encounter (Signed)
Thank you so much for assisting [name] in getting her inhaler. I very much appreciate it!

## 2020-07-30 NOTE — Telephone Encounter (Signed)
I left the patient portion of the [place] application with the front desk with instructions to place with pt's Trelegy samples.  Spoke to patient on the phone and patient is aware that the paperwork will be available to her when she picks up her samples.  She is to complete and sign, leaving the paperwork with the front desk to place in my medication assistance box.

## 2020-07-30 NOTE — Telephone Encounter (Signed)
Contacted patient at the request of PCP, Dr. Higinio Plan.   Patient in need of support for Trelegy Ellipta Inhaler  Medication Samples have been provided for patient pick-up at the [facility] front desk.  Drug name: Trelegy   Qty: 28 day supply (2 devices)  LOT: [id number]  Exp.Date: 02/26/2022  Dosing instructions: one inhalation daily  The patient has been instructed regarding the correct time, dose, and frequency of taking this medication, including desired effects and most common side effects.   [name] 9:49 AM 07/30/2020   Patient verbalized that she can pick up inhalers today.   I also shared that I would involve Cheryle Horsfall, Pharmacy Technician to assist with application for long-term supply.  She verbalized understanding of treatment plan.

## 2020-07-30 NOTE — Telephone Encounter (Signed)
-----   Message from Patriciaann Clan, DO sent at 07/29/2020  2:33 PM EDT ----- Regarding: Trelegy! Thanks dr Valentina Lucks!

## 2020-07-30 NOTE — Telephone Encounter (Signed)
REviewed and agree. 

## 2020-08-02 NOTE — Chronic Care Management (AMB) (Signed)
  Care Management   Outreach Note  08/02/2020 Name: [name] MRN: [id number] DOB: 1966/05/22  [name] is a 54 y.o. year old female who is a primary care patient of Patriciaann Clan, DO. I reached out to [name] by phone today in response to a referral sent by [name]'s PCP, Patriciaann Clan, DO.     A second unsuccessful telephone outreach was attempted today. The patient was referred to the case management team for assistance with care management and care coordination.   Follow Up Plan: A HIPAA compliant phone message was left for the patient providing contact information and requesting a return call. The care management team will reach out to the patient again over the next 7 days. If patient returns call to provider office, please advise to call [facility] at [phone].  [place] Management

## 2020-08-09 ENCOUNTER — Telehealth: Payer: Self-pay | Admitting: *Deleted

## 2020-08-09 NOTE — Telephone Encounter (Signed)
Dr. Higinio Plan  Yes she called back maybe 5 minutes after I forwarded you the follow up message.  Thank you   Erline Levine

## 2020-08-09 NOTE — Chronic Care Management (AMB) (Signed)
  Chronic Care Management   Outreach Note  08/09/2020 Name: [name] MRN: [id number] DOB: Mar 15, 1966  [name] is a 54 y.o. year old female who is a primary care patient of Patriciaann Clan, DO. I reached out to [name] by phone today in response to a referral sent by [name]'s PCP, Patriciaann Clan, DO.      Third unsuccessful telephone outreach was attempted today. The patient was referred to the case management team for assistance with care management and care coordination. The patient's primary care provider has been notified of our unsuccessful attempts to make or maintain contact with the patient. The care management team is pleased to engage with this patient at any time in the future should he/she be interested in assistance from the care management team.   Follow Up Plan: We have been unable to make contact with the patient. The care management team is available to follow up with the patient after provider conversation with the patient regarding recommendation for care management engagement and subsequent re-referral to the care management team.   [place] Management

## 2020-08-09 NOTE — Chronic Care Management (AMB) (Signed)
  Care Management   Note  08/09/2020 Name: JADAN ROUILLARD MRN: [id number] DOB: 1966-06-22  JAHYRA SUKUP is a 54 y.o. year old female who is a primary care patient of Patriciaann Clan, DO. I reached out to [name] by phone today in response to a referral sent by [name]'s health plan.    [name] was given information about care management services today including:  1. Care management services include personalized support from designated clinical staff supervised by her physician, including individualized plan of care and coordination with other care providers 2. 24/7 contact phone numbers for assistance for urgent and routine care needs. 3. The patient may stop care management services at any time by phone call to the office staff.  Patient agreed to services and verbal consent obtained.   Follow up plan: Telephone appointment with care management team member scheduled for:08/13/2020  [place] Management

## 2020-08-13 ENCOUNTER — Ambulatory Visit: Payer: No Typology Code available for payment source | Admitting: Licensed Clinical Social Worker

## 2020-08-13 DIAGNOSIS — Z139 Encounter for screening, unspecified: Secondary | ICD-10-CM

## 2020-08-13 DIAGNOSIS — Z789 Other specified health status: Secondary | ICD-10-CM

## 2020-08-13 NOTE — Patient Instructions (Signed)
  [name]  it was nice speaking with you. Please call me directly [phone] if you have questions about the goals we discussed. Goals Addressed            This Visit's Progress   . Find Help in My Community with Medication       Follow Up Date 08/20/2020    - Explore community options provided - call [facility] for them to answer questions about the orange card income requirements  [phone]   Why is this important?   Knowing how and where to find help for yourself or family in your neighborhood and community is an important skill.  You will want to take some steps to learn how.       [name] received Care Management services today:  1. Care Management services include personalized support from designated clinical staff supervised by her physician, including individualized plan of care and coordination with other care providers 2. 24/7 contact [phone] for assistance for urgent and routine care needs. 3. Care Management are voluntary services and be declined at any time by calling the office.  Patient verbalizes understanding of instructions provided today.  Follow up plan: SW will follow up with patient by phone over the next 5 to 7 days  Maurine Cane, LCSW

## 2020-08-13 NOTE — Chronic Care Management (AMB) (Signed)
Care Management   Clinical Social Work initial Note  08/13/2020 Name: [name] MRN: [id number] DOB: 1966/10/22 [name] is a 54 y.o. year old female who sees Natalie Clan, DO for primary care. The Care Management team was consulted by PCP to assist the patient with insurance options and Medication Assistance .  LCSW reached out to [name] today by phone to introduce self, assess needs and barriers to care.    Assessment: Patient continues to experience difficulty affording her medications and getting all needed paper work for the [employer]. She has applied for disability which is pending, Medicaid was denied. Current Barriers:  No income, no insurance.  Patient not filed taxes and does not have documents to attach to [employer].  Recommendation: Patient may benefit from, and is in agreement to [place] for advise on what statement they will take.  Clinical Social Work Goal(s): Over the next  30 days, LCSW will assist patient with removing barriers to care. Interventions provided by LCSW:   Assessment of needs, barriers , agencies contacted, as well as how impacting     Provided patient with information about [place] . Collaborated with [name], pharmacy Tech regarding assistance with medication ( zoloft, Buspar and Gabapentin)  choices provided  emotional support provided  questions answered  self-reliance encouraged . Solution-Focused Strategies;Problem Solving ;Motivational Interviewing    Plan: Patient would like continued follow-up. LCSW will f/u with patient in 5 to 7 days as well as collaborate with pharmacy for assistance with medication.  Advance Directive Status; not addressed during this encounter.  SDOH (Social Determinants of Health) assessments performed: Yes ;  SDOH Interventions     Most Recent Value  SDOH Interventions  Financial Strain Interventions Other (Comment)       Goals  Addressed            This Visit's Progress   . Find Help in My Community with Medication       Follow Up Date 08/20/2020    - Explore community options provided - call [facility] for them to answer questions about the orange card income requirements  [phone]   Why is this important?   Knowing how and where to find help for yourself or family in your neighborhood and community is an important skill.  You will want to take some steps to learn how.         Outpatient Encounter Medications as of 08/13/2020  Medication Sig Note  . sertraline (ZOLOFT) 50 MG tablet TAKE 1 TABLET BY MOUTH EVERY DAY   . albuterol (VENTOLIN HFA) 108 (90 Base) MCG/ACT inhaler Inhale 2 puffs into the lungs every 6 (six) hours as needed for wheezing or shortness of breath.   Marland Kitchen aspirin 81 MG chewable tablet Chew 81 mg by mouth daily.   Marland Kitchen atorvastatin (LIPITOR) 40 MG tablet Take 1 tablet (40 mg total) by mouth daily. Take 20mg  for the first week.   Marland Kitchen azithromycin (ZITHROMAX Z-PAK) 250 MG tablet Take 2 tabs (500mg ) on the first day, followed by 1 tab daily for 4 days.   . busPIRone (BUSPAR) 10 MG tablet TAKE 1 TABLET BY MOUTH TWICE A DAY AS NEEDED   . cyclobenzaprine (FLEXERIL) 10 MG tablet Take 1 tablet (10 mg total) by mouth 3 (three) times daily as needed for muscle spasms.   . Fluticasone-Umeclidin-Vilant (TRELEGY ELLIPTA) 200-62.5-25 MCG/INH AEPB Inhale 1 puff into the lungs daily. (  Patient not taking: Reported on 07/30/2020) 07/30/2020: OUT of supply.  . gabapentin (NEURONTIN) 100 MG capsule TAKE 1 CAPSULE BY MOUTH 3 TIMES A DAY   . HYDROcodone-acetaminophen (NORCO) 5-325 MG tablet Take 1 tablet by mouth every 6 (six) hours as needed for moderate pain. Max 4,000mg  tylenol daily   . MELATONIN PO Take 1 tablet by mouth at bedtime as needed (sleep).    No facility-administered encounter medications on file as of 08/13/2020.   Review of patient status, including review of  consultants reports, relevant laboratory and other test results, and collaboration with appropriate care team members and the patient's provider was performed as part of comprehensive patient evaluation and provision of chronic care management services.       Information about Care Management services was shared with Ms.  Reeder today including:  1. Care Management services include personalized support from designated clinical staff supervised by her physician, including individualized plan of care and coordination with other care providers 2. Remind patient of 24/7 contact phone numbers to provider's office for assistance with urgent and routine care needs. 3. Care Management services are voluntary and patient may stop at any time .  Patient agreed to services provided today and verbal consent obtained.     [name], [place] / [place]   [phone] 4:03 PM

## 2020-08-17 ENCOUNTER — Telehealth: Payer: Self-pay

## 2020-08-17 NOTE — Telephone Encounter (Signed)
Faxed [place] Patient Assistance Program application for Trelegy to [place] today for processing.

## 2020-08-20 ENCOUNTER — Telehealth: Payer: No Typology Code available for payment source

## 2020-08-20 ENCOUNTER — Other Ambulatory Visit: Payer: Self-pay | Admitting: Family Medicine

## 2020-08-20 ENCOUNTER — Telehealth: Payer: Self-pay | Admitting: Licensed Clinical Social Worker

## 2020-08-20 DIAGNOSIS — R202 Paresthesia of skin: Secondary | ICD-10-CM

## 2020-08-20 NOTE — Chronic Care Management (AMB) (Signed)
  Social Work Care Management  Unsuccessful Phone Outreach   08/20/2020 Name: [name] MRN: [id number] DOB: 01-30-1966   Reason for referral : Care Coordination (f/u call)   PAELYN SMICK is a 54 y.o. year old female who sees Patriciaann Clan, DO for primary care.    Two f/u outreach attempts to patient today to assess needs and barriers with care plan goals and being able to afford medication. Telephone outreach was unsuccessful. A HIPPA compliant phone message was left for the patient providing contact information and requesting a return call.  Plan:  If no return call is received. LCSW will call again in 5 to 7 days.  Review of patient status, including review of consultants reports, relevant laboratory and other test results, and collaboration with appropriate care team members and the patient's provider was performed as part of comprehensive patient evaluation and provision of care management services.   Casimer Lanius, [place] / [place]   [phone] 10:04 AM

## 2020-08-24 NOTE — Telephone Encounter (Signed)
Trelegy 236mcg has been approved with [place] Patient Assistance Program thru 08/18/21, RX #[id number].  Medication ships to patients home address.  [facility] gave me an estimated delivery date of 08/26/20 for first shipment.  Left voicemail for patient with instructions to make note of rx # and [place] pharmacy # on rx label for requesting refills 7-10 business days before need.  [place] may be reached at [phone].

## 2020-08-26 ENCOUNTER — Other Ambulatory Visit: Payer: Self-pay | Admitting: Family Medicine

## 2020-08-26 ENCOUNTER — Ambulatory Visit: Payer: No Typology Code available for payment source | Admitting: Licensed Clinical Social Worker

## 2020-08-26 ENCOUNTER — Telehealth: Payer: Self-pay | Admitting: Licensed Clinical Social Worker

## 2020-08-26 ENCOUNTER — Telehealth: Payer: Self-pay

## 2020-08-26 DIAGNOSIS — F419 Anxiety disorder, unspecified: Secondary | ICD-10-CM

## 2020-08-26 DIAGNOSIS — Z789 Other specified health status: Secondary | ICD-10-CM

## 2020-08-26 DIAGNOSIS — R202 Paresthesia of skin: Secondary | ICD-10-CM

## 2020-08-26 MED ORDER — SERTRALINE HCL 50 MG PO TABS: 50.0000 mg | ORAL_TABLET | Freq: Every day | ORAL | 2 refills | Status: DC

## 2020-08-26 MED ORDER — GABAPENTIN 100 MG PO CAPS: 100.0000 mg | ORAL_CAPSULE | Freq: Three times a day (TID) | ORAL | 2 refills | Status: DC

## 2020-08-26 MED ORDER — BUSPIRONE HCL 10 MG PO TABS: 10.0000 mg | ORAL_TABLET | Freq: Two times a day (BID) | ORAL | 1 refills | Status: DC | PRN

## 2020-08-26 NOTE — Telephone Encounter (Signed)
Spoke to [name] and obtained financial info needed to qualify patient for our free stock of buspirone and sertraline.  Patient will pick up all 3 meds tomorrow, 08/27/20, $4 total.

## 2020-08-26 NOTE — Chronic Care Management (AMB) (Signed)
Clinical Social Work  Care Management referral   08/26/2020 Name: [name] MRN: [id number] DOB: [birth year]  [name] is a 54 y.o. year old female who is a primary care patient of Patriciaann Clan, DO .   F/U call to [name] today by phone to assess needs and barriers to care.   Assessment: Patient continues to experience barriers with completing [facility] card application.  Current Barriers:  Has not received mailed [place] card application Recommendation: Patient may benefit from, and is in agreement to having Rx transferred to [place] to reduce cost of medication. Clinical Social Work Goal(s): Patient will be able to obtain medication and start application process for [employer]:  Assessment of needs and barriers completed;  Marland Kitchen Referred patient to community health and wellness for assistance with medication  . Mailed patient a new [place] care application . Collaborated with Claiborne Billings, pharmacy tech and PCP  re: patient's medication needs . Other interventions include:Solution-Focused Strategies and Problem Solving  Plan: Patient agreed to services provided today, however does not require or desire additional follow up.  No outreach scheduled with LCSW  Patient will call office if needed Advance Directive Status:  not addressed during this encounter.  SDOH (Social Determinants of Health) assessments performed:  No new needs identified   Goals Addressed            This Visit's Progress   . Find Help in My Community with Medication        - Attached is the [employer] application please return it to the add provided - call [facility] for them to answer questions about the orange card income requirements They will tell you what documents you need  [phone]   Why is this important?   Knowing how and where to find help for yourself or family in your neighborhood and community is an important skill.   You will want to take some steps to learn how.       Outpatient Encounter Medications as of 08/26/2020  Medication Sig Note  . albuterol (VENTOLIN HFA) 108 (90 Base) MCG/ACT inhaler Inhale 2 puffs into the lungs every 6 (six) hours as needed for wheezing or shortness of breath.   Marland Kitchen aspirin 81 MG chewable tablet Chew 81 mg by mouth daily.   Marland Kitchen atorvastatin (LIPITOR) 40 MG tablet Take 1 tablet (40 mg total) by mouth daily. Take 20mg  for the first week.   Marland Kitchen azithromycin (ZITHROMAX Z-PAK) 250 MG tablet Take 2 tabs (500mg ) on the first day, followed by 1 tab daily for 4 days.   . cyclobenzaprine (FLEXERIL) 10 MG tablet Take 1 tablet (10 mg total) by mouth 3 (three) times daily as needed for muscle spasms.   . Fluticasone-Umeclidin-Vilant (TRELEGY ELLIPTA) 200-62.5-25 MCG/INH AEPB Inhale 1 puff into the lungs daily. (Patient not taking: Reported on 07/30/2020) 07/30/2020: OUT of supply.  . gabapentin (NEURONTIN) 100 MG capsule Take 1 capsule (100 mg total) by mouth 3 (three) times daily.   Marland Kitchen HYDROcodone-acetaminophen (NORCO) 5-325 MG tablet Take 1 tablet by mouth every 6 (six) hours as needed for moderate pain. Max 4,000mg  tylenol daily   . MELATONIN PO Take 1 tablet by mouth at bedtime as needed (sleep).   . sertraline (ZOLOFT) 50 MG tablet Take 1 tablet (50 mg total) by mouth daily.    No facility-administered encounter medications on file as of 08/26/2020.   Review of patient status, including review  of consultants reports, relevant laboratory and other test results, and collaboration with appropriate care team members and the patient's provider was performed as part of comprehensive patient evaluation and provision of care management services.    Casimer Lanius, [place] / [place]   [phone] 8:18 AM

## 2020-08-26 NOTE — Patient Instructions (Signed)
  [name]  it was nice speaking with you. Please call me directly [phone] if you have questions about the goals we discussed. Goals Addressed            This Visit's Progress   . Find Help in My Community with Medication        -Attached is the [employer] application please return it to the add provided - call [facility] for them to answer questions about the orange card income requirements They will tell you what documents you need  [phone]   Why is this important?   Knowing how and where to find help for yourself or family in your neighborhood and community is an important skill.  You will want to take some steps to learn how.      [name] received Care Management services today:  1. Care Management services include personalized support from designated clinical staff supervised by her physician, including individualized plan of care and coordination with other care providers 2. 24/7 contact [phone] for assistance for urgent and routine care needs. 3. Care Management are voluntary services and be declined at any time by calling the office.  The patient verbalized understanding of instructions provided today and agreed to receive a mailed copy of patient instruction and/or educational materials. Follow up plan: [name] will call if needed.  no outreach call scheduled with LCSW.  Maurine Cane, LCSW

## 2020-08-26 NOTE — Chronic Care Management (AMB) (Signed)
   Social Work Care Management  2nd Unsuccessful  Phone Outreach   08/26/2020 Name: [name] MRN: [id number] DOB: [birth year]  Referred by: Patriciaann Clan, DO ,  Reason for referral : Care Coordination (f/u call)   [name] is a 54 y.o. year old female who sees Patriciaann Clan, DO for primary care. 2nd unsuccessful telephone outreach attempt to [name] today to assist with care coordination. A HIPPA compliant phone message was left for the patient providing contact information and requesting a return call.  Plan: LCSW will wait for return call, if no return call is received, will reach out to [name] again over the next 7 days. If unable to reach [name] by phone on the 3rd attempt, will discontinue outreach calls but will be available at any time to provide services to [name].   Review of patient status, including review of consultants reports, relevant laboratory and other test results, and collaboration with appropriate care team members and the patient's provider was performed as part of comprehensive patient evaluation and provision of care management services.   Casimer Lanius, [place] / [place]   [phone] 3:35 PM

## 2020-09-10 ENCOUNTER — Telehealth: Payer: Self-pay

## 2020-09-10 NOTE — Telephone Encounter (Signed)
Patient calls nurse line requesting pain medication due to molar crown falling off. Patient is reporting severe pain. Patient does not have insurance and does not have a dentist. Patient is in the process of applying for orange card and will establish care at dentist once she is approved.   To PCP  Please advise  Talbot Grumbling, RN

## 2020-09-15 ENCOUNTER — Encounter: Payer: Self-pay | Admitting: Family Medicine

## 2020-09-15 ENCOUNTER — Telehealth: Payer: Self-pay | Admitting: Family Medicine

## 2020-09-15 DIAGNOSIS — K047 Periapical abscess without sinus: Secondary | ICD-10-CM

## 2020-09-15 DIAGNOSIS — K0889 Other specified disorders of teeth and supporting structures: Secondary | ICD-10-CM

## 2020-09-15 MED ORDER — TRAMADOL HCL 50 MG PO TABS: 50.0000 mg | ORAL_TABLET | Freq: Four times a day (QID) | ORAL | 0 refills | Status: AC | PRN

## 2020-09-15 MED ORDER — PENICILLIN V POTASSIUM 500 MG PO TABS: 500.0000 mg | ORAL_TABLET | Freq: Three times a day (TID) | ORAL | 0 refills | Status: DC

## 2020-09-15 NOTE — Telephone Encounter (Signed)
Called patient discussed, however no answer.  Currently on night shift, will attempt to call her later tonight and will send a MyChart message.  Patriciaann Clan, DO

## 2020-09-15 NOTE — Telephone Encounter (Signed)
Call patient in reference to her dental pain.  Reports that the crown fell off of her posterior molar and has been exquisitely painful.  Has barely been able to eat due to the pain.  No fever or swelling, some redness around the area.  Also reports a "hole "in the tooth in front of this area.  She has been using Tylenol, ibuprofen, and Orajel without relief.  At baseline she already has poor dentition, previously was following with a dentist however she is waiting to get reestablished with the orange card.  She had already called their clinic, however they will not schedule until this is back in place.  She sent her application in the mail today.  Rx'd penicillin V x 5 days (had trouble swallowing amoxicillin pills previously) and short course of tramadol for pain relief.  Encouraged soft room temperature foods.  Hopeful orange card will be approved soon.  Patriciaann Clan, DO

## 2020-10-01 ENCOUNTER — Other Ambulatory Visit: Payer: Self-pay | Admitting: Family Medicine

## 2020-10-01 DIAGNOSIS — F419 Anxiety disorder, unspecified: Secondary | ICD-10-CM

## 2020-10-06 ENCOUNTER — Telehealth: Payer: Self-pay

## 2020-10-06 NOTE — Telephone Encounter (Signed)
Patient calls nurse line reporting continued dental issues. Patient reports the left side of her face is very swollen and painful. Patient reports she can not afford to go to the dentist and no longer has the orange card. Offered an apt for tomorrow, however patient reports her car got towed. Patient reports the swelling went down with Prednisone last month and Tramadol helped with pain. Will forward to PCP.

## 2020-10-08 ENCOUNTER — Encounter: Payer: Self-pay | Admitting: Family Medicine

## 2020-10-08 NOTE — Telephone Encounter (Signed)
Called patient. No answer and left voicemail. Unfortunately due to her worsening symptoms, she really should be seen in the clinic for further management. Please schedule an appointment for her if she calls back. Ultimately most definitely needs to be seen by a dentist as well.   Natalie Clan, DO

## 2020-10-09 ENCOUNTER — Telehealth: Payer: Self-pay | Admitting: Family Medicine

## 2020-10-09 DIAGNOSIS — K089 Disorder of teeth and supporting structures, unspecified: Secondary | ICD-10-CM

## 2020-10-09 MED ORDER — HYDROCODONE-ACETAMINOPHEN 5-325 MG PO TABS: 1.0000 | ORAL_TABLET | Freq: Three times a day (TID) | ORAL | 0 refills | Status: DC | PRN

## 2020-10-09 NOTE — Telephone Encounter (Signed)
Call patient again to discuss dental concern.  She reports severe pain in the similar spot discussed in November 2021.  Reports her mouth is swollen in this area without any fever or skin erythema.  She currently does not have any insurance or aid assistance, however just found out today that she did qualify for disability so will be getting Medicaid now.  Discussed that she really does need to be seen in person for further evaluation of this area especially given the duration this is occurred and with subjective swelling.  Scheduled an appointment with myself on 12/15 at 4:10 PM.  However, most importantly really does need to see a dentist ASAP for definitive solution.  She unfortunately has no transportation, [name] is there any way that we can provide transportation for a medical appointment?   Rx'd short course of Norco to help with severe pain.  Already taking maximum dose of Tylenol and ibuprofen alternating throughout the day.  F/U sooner if development of any fever, fatigue, worsening swelling.  Natalie Clan, DO

## 2020-10-12 ENCOUNTER — Other Ambulatory Visit: Payer: Self-pay | Admitting: Family Medicine

## 2020-10-12 ENCOUNTER — Telehealth: Payer: Self-pay

## 2020-10-12 DIAGNOSIS — Z659 Problem related to unspecified psychosocial circumstances: Secondary | ICD-10-CM

## 2020-10-12 NOTE — Telephone Encounter (Signed)
    MA12/14/2021 1st Attempt  Name: SHATIMA ZALAR   MRN: [id number]   DOB: 10-13-1966   AGE: 54 y.o.   GENDER: female   PCP Patriciaann Clan, DO.   Referral Reason: Transportation Needs   Interventions: Unsuccessful outbound call placed to the patient. A HIPPA compliant voice message left requesting a return call. Received message from Laverda Sorenson that patient had called the office and left message for [place] that she no longer needs transportation  Follow up plan: No further follow up planned at this time. Closing referral per message from [place]. Ambrose Mantle [phone]     Deigo Alonso Andree Elk, AAS Paralegal, [place] . Embedded Care Coordination [facility]  Care Management  [address], [place] [address] [email or web address]  [email or web address]   [email or web address]

## 2020-10-13 ENCOUNTER — Other Ambulatory Visit: Payer: Self-pay

## 2020-10-13 ENCOUNTER — Encounter: Payer: Self-pay | Admitting: Family Medicine

## 2020-10-13 ENCOUNTER — Ambulatory Visit (INDEPENDENT_AMBULATORY_CARE_PROVIDER_SITE_OTHER): Payer: No Typology Code available for payment source | Admitting: Family Medicine

## 2020-10-13 VITALS — BP 105/65 | HR 65 | Ht 60.0 in | Wt 103.4 lb

## 2020-10-13 DIAGNOSIS — M79674 Pain in right toe(s): Secondary | ICD-10-CM | POA: Insufficient documentation

## 2020-10-13 DIAGNOSIS — K089 Disorder of teeth and supporting structures, unspecified: Secondary | ICD-10-CM

## 2020-10-13 DIAGNOSIS — K047 Periapical abscess without sinus: Secondary | ICD-10-CM

## 2020-10-13 MED ORDER — PENICILLIN V POTASSIUM 500 MG PO TABS: 500.0000 mg | ORAL_TABLET | Freq: Three times a day (TID) | ORAL | 0 refills | Status: DC

## 2020-10-13 NOTE — Assessment & Plan Note (Signed)
Fourth toe after accidentally hitting it yesterday evening.  Some mild discomfort with palpation along phalange only, however otherwise without significant deformity or pain more proximal along the metatarsals.  Discussed that this likely could be soft tissue bruising vs fracture, however will not pursue further imaging for confirmation given financial status and unlikelihood to change management, which she is in agreement with.  Recommended buddy taping and monitoring for any further symptoms.

## 2020-10-13 NOTE — Progress Notes (Signed)
    SUBJECTIVE:   CHIEF COMPLAINT / HPI: F/u dental pain  [name] is a 54 year old female presenting for further evaluation of dental pain.  Dental pain: She has a history of known poor dentition.  Discussed severe left-sided dental pain with perceived swelling in this area on 11/17 after crown fell off, please see telephone note.  Rx'd penicillin for 5 days and pain medication at that time.  Reports this significantly improved the pain, however it returned approximately 1 week later.  She has been using the maximum doses of Tylenol, ibuprofen, and using Norco/tramadol as needed.  Eating mainly soup.  She has had difficulty getting scheduled a dentist as she has had no insurance or financial assistance, however she finally was able to schedule an appointment to be seen on 1/13.  She denies any associated fever.  Toe pain: Stubbed her right fourth toe yesterday evening.  Reports she has a little bit of bruising and pain in this area.  Able to walk without concern on this foot.  No associated numbness/weakness.  She is wondering if it is broken.  PERTINENT  PMH / PSH: Presumed COPD, migraines, anxiety, previous smoker, poor dentition  OBJECTIVE:   BP 105/65   Pulse 65   Ht 5' (1.524 m)   Wt 103 lb 6.4 oz (46.9 kg)   SpO2 96%   BMI 20.19 kg/m   General: Alert, NAD HEENT: NCAT, poor dentition present.  Posterior left molar without overlying crown with proximal darkening along gumline and yellow discoloration.  No significant gingival swelling or erythema, no fluctuant mass/abscess seen.  No significant soft tissue swelling seen on left compared to right. Lungs: No increased WOB  Msk: Moves all extremities spontaneously, right fourth toe with mild bruising overlying dorsal aspect and tender to palpation of phalange only, no tenderness or step-off felt with palpation to more proximal metatarsals.  No significant toe deformity or soft tissue swelling seen.  5/5 foot/toe strength with sensation  to light touch intact throughout. Ext: Warm, dry, 2+ distal pulses        ASSESSMENT/PLAN:   Poor dentition Currently with continued likely gingival/dental infection along her left posterior molar after her crown falling off.  Fortunately not associated with any additional abscess formation.  Rx'd penicillin V x 10 days, ultimately as a bridge to get her through until she is able to be seen by her dentist on 1/13 for more definitive treatment.  May use alternating Tylenol/ibuprofen, has few Norco left for severe pain.  Toe pain, right Fourth toe after accidentally hitting it yesterday evening.  Some mild discomfort with palpation along phalange only, however otherwise without significant deformity or pain more proximal along the metatarsals.  Discussed that this likely could be soft tissue bruising vs fracture, however will not pursue further imaging for confirmation given financial status and unlikelihood to change management, which she is in agreement with.  Recommended buddy taping and monitoring for any further symptoms.    Follow-up if not improving/worsening with the above concerns, otherwise follow-up in 2 months, hopefully can have her insurance cleared and will be be able to continue with preventative health care and referral to pulmonology.  Patriciaann Clan, [place]

## 2020-10-13 NOTE — Patient Instructions (Signed)
Tylenol up to 4,000mg  daily (1000 in divided doses) Ibuprofen up to 3,000mg  daily (800 max in doses)

## 2020-10-13 NOTE — Assessment & Plan Note (Addendum)
Currently with continued likely gingival/dental infection along her left posterior molar after her crown falling off.  Fortunately not associated with any additional abscess formation.  Rx'd penicillin V x 10 days, ultimately as a bridge to get her through until she is able to be seen by her dentist on 1/13 for more definitive treatment.  May use alternating Tylenol/ibuprofen, has few Norco left for severe pain.

## 2020-11-09 ENCOUNTER — Other Ambulatory Visit: Payer: Self-pay | Admitting: Family Medicine

## 2020-11-09 DIAGNOSIS — K047 Periapical abscess without sinus: Secondary | ICD-10-CM

## 2020-11-09 MED ORDER — PENICILLIN V POTASSIUM 500 MG PO TABS: 500.0000 mg | ORAL_TABLET | Freq: Three times a day (TID) | ORAL | 0 refills | Status: AC

## 2020-11-26 ENCOUNTER — Other Ambulatory Visit: Payer: Self-pay | Admitting: Family Medicine

## 2020-11-26 DIAGNOSIS — R202 Paresthesia of skin: Secondary | ICD-10-CM

## 2020-11-26 DIAGNOSIS — F419 Anxiety disorder, unspecified: Secondary | ICD-10-CM

## 2020-12-10 ENCOUNTER — Encounter: Payer: Self-pay | Admitting: Family Medicine

## 2020-12-10 ENCOUNTER — Telehealth: Payer: Self-pay

## 2020-12-10 ENCOUNTER — Ambulatory Visit (INDEPENDENT_AMBULATORY_CARE_PROVIDER_SITE_OTHER): Payer: No Typology Code available for payment source | Admitting: Family Medicine

## 2020-12-10 ENCOUNTER — Other Ambulatory Visit: Payer: Self-pay

## 2020-12-10 VITALS — BP 106/62 | HR 67 | Wt 100.2 lb

## 2020-12-10 DIAGNOSIS — M545 Low back pain, unspecified: Secondary | ICD-10-CM

## 2020-12-10 DIAGNOSIS — Z1211 Encounter for screening for malignant neoplasm of colon: Secondary | ICD-10-CM

## 2020-12-10 DIAGNOSIS — Z87891 Personal history of nicotine dependence: Secondary | ICD-10-CM

## 2020-12-10 DIAGNOSIS — M79604 Pain in right leg: Secondary | ICD-10-CM

## 2020-12-10 DIAGNOSIS — Z0000 Encounter for general adult medical examination without abnormal findings: Secondary | ICD-10-CM

## 2020-12-10 DIAGNOSIS — F411 Generalized anxiety disorder: Secondary | ICD-10-CM

## 2020-12-10 DIAGNOSIS — R636 Underweight: Secondary | ICD-10-CM | POA: Diagnosis not present

## 2020-12-10 DIAGNOSIS — E785 Hyperlipidemia, unspecified: Secondary | ICD-10-CM

## 2020-12-10 DIAGNOSIS — J449 Chronic obstructive pulmonary disease, unspecified: Secondary | ICD-10-CM

## 2020-12-10 DIAGNOSIS — M79605 Pain in left leg: Secondary | ICD-10-CM

## 2020-12-10 DIAGNOSIS — Z23 Encounter for immunization: Secondary | ICD-10-CM

## 2020-12-10 DIAGNOSIS — R9389 Abnormal findings on diagnostic imaging of other specified body structures: Secondary | ICD-10-CM

## 2020-12-10 DIAGNOSIS — R202 Paresthesia of skin: Secondary | ICD-10-CM

## 2020-12-10 DIAGNOSIS — K089 Disorder of teeth and supporting structures, unspecified: Secondary | ICD-10-CM

## 2020-12-10 DIAGNOSIS — M5416 Radiculopathy, lumbar region: Secondary | ICD-10-CM

## 2020-12-10 MED ORDER — GABAPENTIN 300 MG PO CAPS: 300.0000 mg | ORAL_CAPSULE | Freq: Two times a day (BID) | ORAL | 3 refills | Status: DC

## 2020-12-10 MED ORDER — SERTRALINE HCL 100 MG PO TABS: 100.0000 mg | ORAL_TABLET | Freq: Every day | ORAL | 3 refills | Status: DC

## 2020-12-10 MED ORDER — MELOXICAM 15 MG PO TABS
15.0000 mg | ORAL_TABLET | Freq: Every day | ORAL | 0 refills | Status: DC
Start: 2020-12-10 — End: 2021-02-01

## 2020-12-10 NOTE — Telephone Encounter (Signed)
Patient [name] on nurse line stating she went by her pharmacy to pick up medications and receive her shingles vaccine, however they have no record. I do not see where anything has been called yet. Will forward to PCP.

## 2020-12-10 NOTE — Patient Instructions (Addendum)
It was wonderful to see you today.  I am going to send in an anti-inflammatory/pain medication called meloxicam that you can take once daily.  In addition you can also take Tylenol up to 4000 mg daily.  Do not take this medication with Motrin, Aleve, or ibuprofen.  Additionally please continue to put heat on your back several times a day for at least 20 minutes.  If this back pain becomes more severe or you have any associated extremity weakness, numbness of your groin, unable to urinate/or bowel incontinence please go to the ED.  I will place a referral to orthopedic surgery to fully evaluate your back as well.  I will place a referral to get your colonoscopy and also referral to pulmonology to evaluate for your lung disease.  I will either MyChart message you or call you with your lab results.

## 2020-12-10 NOTE — Progress Notes (Signed)
SUBJECTIVE:   CHIEF COMPLAINT / HPI: Follow up   [name] is a 55 year old female presenting for follow-up.  She would like to get all of her health maintenance up-to-date and work towards improving her chronic conditions as she recently qualified for Medicaid.  Prior to this she had not had insurance or financial aid for approximately 1.5 years.  Low back pain: Acute and recurrent.  This current episode started approximately 4 weeks ago and has not improved since onset.  Reports burning sensation/tingling down her right posterior leg to lower leg.  She has had a similar presentation off and on for the past several years.  Known history of intermittent disc bulging around L5-S1 is evident on MRI in 2020 and in early 2000.  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  She has been using heat, ibuprofen, Tylenol, and Voltaren gel with little relief.  Denies any weakness, falls, bowel incontinence, or saddle anesthesia.  She does report urinary incontinence approximately 2-3 episodes in the last few weeks, however she has a known history of intermittent urinary incontinence for the past 15 years.  Anxiety: Would like to increase her Zoloft.  Still feels particularly anxious however does report improvement with Zoloft and BuSpar.  Poor dentition: Has an appointment set up with a dentist after several rounds of antibiotics later this month.  PERTINENT  PMH / PSH: Presumed COPD, migraines, anxiety, previous smoker, poor dentition, chronic low back pain, s/p total hysterectomy  OBJECTIVE:   BP 106/62   Pulse 67   Wt 100 lb 3.2 oz (45.5 kg)   SpO2 96%   BMI 19.57 kg/m   General: Alert, NAD HEENT: NCAT, MMM Lungs: No increased WOB  Msk: Moves all extremities spontaneously  Ext: Warm, dry, 2+ distal pulses, no rash   Lumbar spine: - Inspection: no gross deformity or asymmetry,  swelling or ecchymosis - Palpation: No TTP over the spinous processes, however tender to right L5-S1 paraspinal muscles and upper thigh/greater trochanter - ROM: full active ROM of the lumbar spine in flexion and extension - Strength: 5/5 strength of lower extremity in L4-S1 nerve root distributions b/l; normal gait - Neuro: sensation intact in the L4-S1 nerve root distribution b/l (however reports decreased sensation on the R compared to L in entire thigh), 2+ L4 and S1 reflexes  ASSESSMENT/PLAN:   COPD (chronic obstructive pulmonary disease) (HCC) No formal PFTs due to previous social/financial restraints.  COPD Gold stage D as frequently symptomatic with several moderate exacerbations over the past year, has not required hospitalization.  On Trelegy.  Placed referral to pulmonology for additional assistance, especially with previous abnormal CT chest.  Acute right lumbar radiculopathy Recurrent with chronic right-sided low back/hip pain, previous evidence of L5-S1 disc bulge on MRI in 2020.  Reassuringly with preserved strength, low suspicion that current intermittent urinary incontinence is related given her known history.  Rx'd short course of meloxicam, increase gabapentin, heat, Voltaren gel.  Placed referral to orthopedic spine surgery for additional evaluation and consideration for CSI given chronic pain.  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  Does not appear that she has been on her statin, likely to restart.  GAD (generalized anxiety disorder) Moderate control currently.  Will increase Zoloft to 100 mg.  Continue BuSpar as needed.  Poor dentition Establishing care with dentist this month.  Likely will require several  extractions.  Healthcare maintenance -Referral to GI for screening colonoscopy -Instructed to call breast center for updated mammogram, last WNL in 2020 -Given flu shot today -Shingrix  prescription sent to pharmacy -Not interested in COVID-19 vaccination at this time    F/u in approximately 2 weeks or sooner if needed. ED precautions discussed.   [name], [place]

## 2020-12-11 ENCOUNTER — Encounter: Payer: Self-pay | Admitting: Family Medicine

## 2020-12-11 DIAGNOSIS — Z716 Tobacco abuse counseling: Secondary | ICD-10-CM | POA: Insufficient documentation

## 2020-12-11 DIAGNOSIS — M5416 Radiculopathy, lumbar region: Secondary | ICD-10-CM | POA: Insufficient documentation

## 2020-12-11 DIAGNOSIS — R5383 Other fatigue: Secondary | ICD-10-CM | POA: Insufficient documentation

## 2020-12-11 DIAGNOSIS — Z0000 Encounter for general adult medical examination without abnormal findings: Secondary | ICD-10-CM | POA: Insufficient documentation

## 2020-12-11 LAB — CBC WITH DIFFERENTIAL/PLATELET
Basophils Absolute: 0.1 10*3/uL (ref 0.0–0.2)
Basos: 1 %
EOS (ABSOLUTE): 0.2 10*3/uL (ref 0.0–0.4)
Eos: 2 %
Hematocrit: 43.1 % (ref 34.0–46.6)
Hemoglobin: 14.4 g/dL (ref 11.1–15.9)
Immature Grans (Abs): 0 10*3/uL (ref 0.0–0.1)
Immature Granulocytes: 0 %
Lymphocytes Absolute: 2.2 10*3/uL (ref 0.7–3.1)
Lymphs: 22 %
MCH: 30.4 pg (ref 26.6–33.0)
MCHC: 33.4 g/dL (ref 31.5–35.7)
MCV: 91 fL (ref 79–97)
Monocytes Absolute: 0.7 10*3/uL (ref 0.1–0.9)
Monocytes: 7 %
Neutrophils Absolute: 6.8 10*3/uL (ref 1.4–7.0)
Neutrophils: 68 %
Platelets: 293 10*3/uL (ref 150–450)
RBC: 4.73 x10E6/uL (ref 3.77–5.28)
RDW: 13.9 % (ref 11.7–15.4)
WBC: 10.2 10*3/uL (ref 3.4–10.8)

## 2020-12-11 LAB — COMPREHENSIVE METABOLIC PANEL
ALT: 9 IU/L (ref 0–32)
AST: 16 IU/L (ref 0–40)
Albumin/Globulin Ratio: 1.8 (ref 1.2–2.2)
Albumin: 4.4 g/dL (ref 3.8–4.9)
Alkaline Phosphatase: 105 IU/L (ref 44–121)
BUN/Creatinine Ratio: 9 (ref 9–23)
BUN: 7 mg/dL (ref 6–24)
Bilirubin Total: 0.3 mg/dL (ref 0.0–1.2)
CO2: 23 mmol/L (ref 20–29)
Calcium: 9.9 mg/dL (ref 8.7–10.2)
Chloride: 100 mmol/L (ref 96–106)
Creatinine, Ser: 0.74 mg/dL (ref 0.57–1.00)
GFR calc Af Amer: 106 mL/min/{1.73_m2} (ref >59)
GFR calc non Af Amer: 92 mL/min/{1.73_m2} (ref >59)
Globulin, Total: 2.5 g/dL (ref 1.5–4.5)
Glucose: 83 mg/dL (ref 65–99)
Potassium: 4 mmol/L (ref 3.5–5.2)
Sodium: 141 mmol/L (ref 134–144)
Total Protein: 6.9 g/dL (ref 6.0–8.5)

## 2020-12-11 LAB — LIPID PANEL
Chol/HDL Ratio: 7 ratio — ABNORMAL HIGH (ref 0.0–4.4)
Cholesterol, Total: 308 mg/dL — ABNORMAL HIGH (ref 100–199)
HDL: 44 mg/dL (ref >39)
LDL Chol Calc (NIH): 246 mg/dL — ABNORMAL HIGH (ref 0–99)
Triglycerides: 103 mg/dL (ref 0–149)
VLDL Cholesterol Cal: 18 mg/dL (ref 5–40)

## 2020-12-11 MED ORDER — ZOSTER VAC RECOMB ADJUVANTED 50 MCG/0.5ML IM SUSR: 0.5000 mL | Freq: Once | INTRAMUSCULAR | 0 refills | Status: AC

## 2020-12-11 NOTE — Assessment & Plan Note (Signed)
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  Does not appear that she has been on her statin, likely to restart.

## 2020-12-11 NOTE — Assessment & Plan Note (Signed)
-  Referral to GI for screening colonoscopy -Instructed to call breast center for updated mammogram, last WNL in 2020 -Given flu shot today -Shingrix prescription sent to pharmacy -Not interested in COVID-19 vaccination at this time

## 2020-12-11 NOTE — Assessment & Plan Note (Signed)
Establishing care with dentist this month.  Likely will require several extractions.

## 2020-12-11 NOTE — Assessment & Plan Note (Signed)
Moderate control currently.  Will increase Zoloft to 100 mg.  Continue BuSpar as needed.

## 2020-12-11 NOTE — Assessment & Plan Note (Addendum)
Recurrent with chronic right-sided low back/hip pain, previous evidence of L5-S1 disc bulge on MRI in 2020.  Reassuringly with preserved strength, low suspicion that current intermittent urinary incontinence is related given her known history.  Rx'd short course of meloxicam, increase gabapentin, heat, Voltaren gel.  Placed referral to orthopedic spine surgery for additional evaluation and consideration for CSI given chronic pain.

## 2020-12-11 NOTE — Assessment & Plan Note (Signed)
No formal PFTs due to previous social/financial restraints.  COPD Gold stage D as frequently symptomatic with several moderate exacerbations over the past year, has not required hospitalization.  On Trelegy.  Placed referral to pulmonology for additional assistance, especially with previous abnormal CT chest.

## 2020-12-14 ENCOUNTER — Telehealth: Payer: Self-pay

## 2020-12-14 ENCOUNTER — Encounter: Payer: Self-pay | Admitting: Internal Medicine

## 2020-12-14 ENCOUNTER — Other Ambulatory Visit: Payer: Self-pay | Admitting: Family Medicine

## 2020-12-14 DIAGNOSIS — E7801 Familial hypercholesterolemia: Secondary | ICD-10-CM

## 2020-12-14 MED ORDER — ROSUVASTATIN CALCIUM 10 MG PO TABS: 10.0000 mg | ORAL_TABLET | Freq: Every day | ORAL | 0 refills | Status: DC

## 2020-12-14 NOTE — Telephone Encounter (Signed)
Patient calls nurse line requesting to speak with provider regarding lab results. Patient has been able to see results via myChart, however, has questions about plan due to elevated lipid panel.   Talbot Grumbling, RN

## 2020-12-18 ENCOUNTER — Ambulatory Visit
Admission: EM | Admit: 2020-12-18 | Discharge: 2020-12-18 | Disposition: A | Discharge status: Home / Self Care | Payer: Medicaid Other | Attending: Emergency Medicine | Admitting: Emergency Medicine

## 2020-12-18 ENCOUNTER — Other Ambulatory Visit: Payer: Self-pay

## 2020-12-18 ENCOUNTER — Encounter: Payer: Self-pay | Admitting: Emergency Medicine

## 2020-12-18 DIAGNOSIS — K047 Periapical abscess without sinus: Secondary | ICD-10-CM

## 2020-12-18 MED ORDER — AMOXICILLIN-POT CLAVULANATE 875-125 MG PO TABS: 1.0000 | ORAL_TABLET | Freq: Two times a day (BID) | ORAL | 0 refills | Status: AC

## 2020-12-18 MED ORDER — IBUPROFEN 800 MG PO TABS: 800.0000 mg | ORAL_TABLET | Freq: Three times a day (TID) | ORAL | 0 refills | Status: DC

## 2020-12-18 NOTE — ED Triage Notes (Signed)
Pt here for left sided dental pain with swelling x 3 days; pt sts needs to have two teeth pulled

## 2020-12-18 NOTE — ED Provider Notes (Signed)
[facility]    CSN: [id number] Arrival date & time: 12/18/20  0831      History   Chief Complaint Chief Complaint  Patient presents with  . Dental Pain    HPI [name] is a 55 y.o. female presenting today for facial swelling.  Reports over the past 2 days has developed increased swelling and pain to her left lower jaw.  Reports poor tooth in this area that needs to be pulled.  Denies fevers.  Denies difficulty swallowing.  Pain radiating towards left ear.  HPI  Past Medical History:  Diagnosis Date  . Allergy   . Anxiety   . Cancer ([place])   . CAP (community acquired pneumonia) 01/07/2020  . Chronic kidney disease    KIDNEYSTONES  . Cigarette smoker 11/05/2018  . Depression   . Heart murmur    MITRAL VALVE PROLASP  . High cholesterol   . History of endometriosis   . Incontinence   . Low back pain radiating to both legs 07/25/2020  . Menopausal symptoms   . Menopause 01/09/2012  . Migraine   . Night sweats 10/05/2019  . Osteoporosis   . Paresthesia of foot, bilateral 03/28/2019  . Productive cough 12/25/2019  . Renal stone 06/01/2020  . Skin cancer   . Thyroid disease   . Tick bite 02/10/2019    Patient Active Problem List   Diagnosis Date Noted  . Acute right lumbar radiculopathy 12/11/2020  . Healthcare maintenance 12/11/2020  . COPD (chronic obstructive pulmonary disease) ([place]) 06/01/2020  . Discontinued smoking 01/07/2020  . GAD (generalized anxiety disorder) 12/26/2019  . Poor social situation 08/29/2019  . Abnormal chest CT 08/29/2019  . Poor dentition 06/25/2019  . Lung nodule, solitary 06/25/2019  . Chronic cough 11/26/2018  . Recent unintentional weight loss over several months 11/05/2018  . Paresthesia 07/22/2018  . 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 Hyperlipidemia 07/22/2018  . Depression 01/09/2012  . Migraine with aura 05/09/2007  . MITRAL VALVE PROLAPSE, MILD M. R.-SBE PROPHYL 05/09/2007    Past Surgical History:  Procedure  Laterality Date  . ANKLE SURGERY     RIGHT  . APPENDECTOMY    . BREAST ENHANCEMENT SURGERY     SALINE  . BUNIONECTOMY     BILATERAL FEET  . TOTAL ABDOMINAL HYSTERECTOMY W/ BILATERAL SALPINGOOPHORECTOMY    . TVT  2007  . WISDOM TOOTH EXTRACTION     X 4    OB History    Gravida  2   Para  2   Term      Preterm      AB      Living  2     SAB      IAB      Ectopic      Multiple      Live Births  2            Home Medications    Prior to Admission medications   Medication Sig Start Date End Date Taking? Authorizing Provider  amoxicillin-clavulanate (AUGMENTIN) 875-125 MG tablet Take 1 tablet by mouth every 12 (twelve) hours for 7 days. 12/18/20 12/25/20 Yes Ashton Sabine C, PA-C  ibuprofen (ADVIL) 800 MG tablet Take 1 tablet (800 mg total) by mouth 3 (three) times daily. 12/18/20  Yes Karalyne Nusser C, PA-C  albuterol (VENTOLIN HFA) 108 (90 Base) MCG/ACT inhaler Inhale 2 puffs into the lungs every 6 (six) hours as needed for wheezing or shortness of breath. 10/03/19   Higinio Plan,  Samantha N, DO  aspirin 81 MG chewable tablet Chew 81 mg by mouth daily.    [provider]  busPIRone (BUSPAR) 10 MG tablet TAKE 1 TABLET (10 MG TOTAL) BY MOUTH 2 (TWO) TIMES DAILY AS NEEDED. 10/01/20   Patriciaann Clan, DO  cyclobenzaprine (FLEXERIL) 10 MG tablet Take 1 tablet (10 mg total) by mouth 3 (three) times daily as needed for muscle spasms. Patient not taking: Reported on 12/18/2020 07/21/20   Patriciaann Clan, DO  Fluticasone-Umeclidin-Vilant (TRELEGY ELLIPTA) 200-62.5-25 MCG/INH AEPB Inhale 1 puff into the lungs daily. Patient not taking: Reported on 07/30/2020 04/30/20   Lyndee Hensen, DO  gabapentin (NEURONTIN) 300 MG capsule Take 1 capsule (300 mg total) by mouth 2 (two) times daily. 12/10/20   Patriciaann Clan, DO  HYDROcodone-acetaminophen (NORCO/VICODIN) 5-325 MG tablet Take 1 tablet by mouth every 8 (eight) hours as needed for moderate pain. 10/09/20   Patriciaann Clan, DO  MELATONIN PO Take 1 tablet by mouth at bedtime as needed (sleep).    [provider]  meloxicam (MOBIC) 15 MG tablet Take 1 tablet (15 mg total) by mouth daily. 12/10/20   Patriciaann Clan, DO  rosuvastatin (CRESTOR) 10 MG tablet Take 1 tablet (10 mg total) by mouth at bedtime. Increase to 20mg  after 1 week if tolerating well. 12/14/20   Patriciaann Clan, DO  sertraline (ZOLOFT) 100 MG tablet Take 1 tablet (100 mg total) by mouth daily. 12/10/20   Patriciaann Clan, DO    Family History Family History  Problem Relation Age of Onset  . Diabetes Mother   . Heart disease Mother   . Hyperlipidemia Mother   . Tuberculosis Father   . Alcohol abuse Father   . Heart disease Sister   . Hyperlipidemia Sister   . Heart disease Brother   . Hyperlipidemia Brother   . Diabetes Maternal Grandmother   . Diabetes Maternal Grandfather     Social History Social History   Tobacco Use  . Smoking status: Former Smoker    Packs/day: 1.00    Years: 25.00    Pack years: 25.00  . Smokeless tobacco: Never Used  Substance Use Topics  . Alcohol use: Yes    Comment: OCCASIONALLY  . Drug use: No     Allergies   Pseudoeph-doxylamine-dm-apap   Review of Systems Review of Systems  Constitutional: Negative for fatigue and fever.  HENT: Positive for dental problem and facial swelling. Negative for mouth sores.   Eyes: Negative for visual disturbance.  Respiratory: Negative for shortness of breath.   Cardiovascular: Negative for chest pain.  Gastrointestinal: Negative for abdominal pain, nausea and vomiting.  Genitourinary: Negative for genital sores.  Musculoskeletal: Negative for arthralgias and joint swelling.  Skin: Negative for color change, rash and wound.  Neurological: Negative for dizziness, weakness, light-headedness and headaches.     Physical Exam Triage Vital Signs ED Triage Vitals  Enc Vitals Group     BP      Pulse      Resp      Temp      Temp src       SpO2      Weight      Height      Head Circumference      Peak Flow      Pain Score      Pain Loc      Pain Edu?      Excl. in [place]?    No  data found.  Updated Vital Signs BP 113/72 (BP Location: Left Arm)   Pulse 78   Temp 98.5 F (36.9 C) (Oral)   Resp 18   SpO2 96%   Visual Acuity Right Eye Distance:   Left Eye Distance:   Bilateral Distance:    Right Eye Near:   Left Eye Near:    Bilateral Near:     Physical Exam Vitals and nursing note reviewed.  Constitutional:      Appearance: She is well-developed and well-nourished.     Comments: No acute distress  HENT:     Head: Normocephalic and atraumatic.     Comments: Facial swelling noted to left lower jaw extending minimally beyond submandibular     Ears:     Comments: Bilateral ears without tenderness to palpation of external auricle, tragus and mastoid, EAC's without erythema or swelling, TM's with good bony landmarks and cone of light. Non erythematous.     Nose: Nose normal.     Mouth/Throat:     Comments: Gingival swelling noted to left lower jaw near first molar No soft palate swelling, nontender to palpation below tongue, posterior pharynx patent, uvula midline Eyes:     Conjunctiva/sclera: Conjunctivae normal.  Cardiovascular:     Rate and Rhythm: Normal rate.  Pulmonary:     Effort: Pulmonary effort is normal. No respiratory distress.  Abdominal:     General: There is no distension.  Musculoskeletal:        General: Normal range of motion.     Cervical back: Neck supple.  Skin:    General: Skin is warm and dry.  Neurological:     Mental Status: She is alert and oriented to person, place, and time.  Psychiatric:        Mood and Affect: Mood and affect normal.      UC Treatments / Results  Labs (all labs ordered are listed, but only abnormal results are displayed) Labs Reviewed - No data to display  EKG   Radiology No results found.  Procedures Procedures (including critical care  time)  Medications Ordered in UC Medications - No data to display  Initial Impression / Assessment and Plan / UC Course  I have reviewed the triage vital signs and the nursing notes.  Pertinent labs & imaging results that were available during my care of the patient were reviewed by me and considered in my medical decision making (see chart for details).     Patient with dental abscess, no airway compromise, no significant signs of Ludwig's angina at this time, but advised patient to continue to monitor swelling, extending more below mandibular line, tongue, recommended to follow-up in emergency room.  Initiating on Augmentin and recommending continued anti-inflammatories warm compresses.  Dental resources provided.  Discussed strict return precautions. Patient verbalized understanding and is agreeable with plan.  Final Clinical Impressions(s) / UC Diagnoses   Final diagnoses:  Dental abscess     Discharge Instructions     Begin Augmentin twice daily for the next week Alternate ice and heat to area Tylenol and ibuprofen for pain and swelling Follow-up in emergency room if swelling and pain worsening despite starting antibiotic    ED Prescriptions    Medication Sig Dispense Auth. Provider   amoxicillin-clavulanate (AUGMENTIN) 875-125 MG tablet Take 1 tablet by mouth every 12 (twelve) hours for 7 days. 14 tablet Tiaja Hagan C, PA-C   ibuprofen (ADVIL) 800 MG tablet Take 1 tablet (800 mg total) by mouth 3 (three) times  daily. 21 tablet Jayelle Page, Jerome C, PA-C     PDMP not reviewed this encounter.   Janith Lima, [place] 12/18/20 [phone]

## 2020-12-18 NOTE — Discharge Instructions (Signed)
Begin Augmentin twice daily for the next week Alternate ice and heat to area Tylenol and ibuprofen for pain and swelling Follow-up in emergency room if swelling and pain worsening despite starting antibiotic

## 2020-12-21 ENCOUNTER — Ambulatory Visit (AMBULATORY_SURGERY_CENTER): Payer: Self-pay

## 2020-12-21 ENCOUNTER — Other Ambulatory Visit: Payer: Self-pay

## 2020-12-21 VITALS — Ht 60.0 in | Wt 101.2 lb

## 2020-12-21 DIAGNOSIS — Z1211 Encounter for screening for malignant neoplasm of colon: Secondary | ICD-10-CM

## 2020-12-21 DIAGNOSIS — R32 Unspecified urinary incontinence: Secondary | ICD-10-CM | POA: Diagnosis not present

## 2020-12-21 MED ORDER — NA SULFATE-K SULFATE-MG SULF 17.5-3.13-1.6 GM/177ML PO SOLN: 1.0000 | Freq: Once | ORAL | 0 refills | Status: AC

## 2020-12-21 NOTE — Progress Notes (Signed)
No egg or soy allergy known to patient  No issues with past sedation with any surgeries or procedures No intubation problems in the past  No FH of Malignant Hyperthermia No diet pills per patient No home 02 use per patient  No blood thinners per patient  Pt denies issues with constipation  No A fib or A flutter  EMMI video to pt or via [place] 19 guidelines implemented in PV today with Pt and RN  Pt is not vaccinated  for Covid   Due to the COVID-19 pandemic we are asking patients to follow certain guidelines.  Pt aware of COVID protocols and [facility] guidelines

## 2020-12-22 ENCOUNTER — Other Ambulatory Visit: Payer: Self-pay

## 2020-12-22 ENCOUNTER — Other Ambulatory Visit: Payer: Self-pay | Admitting: Family Medicine

## 2020-12-22 DIAGNOSIS — E7801 Familial hypercholesterolemia: Secondary | ICD-10-CM

## 2020-12-22 DIAGNOSIS — K089 Disorder of teeth and supporting structures, unspecified: Secondary | ICD-10-CM

## 2020-12-22 DIAGNOSIS — M5416 Radiculopathy, lumbar region: Secondary | ICD-10-CM

## 2020-12-22 NOTE — Telephone Encounter (Signed)
Unable to prescribe without seeing patient for this issue.  Recommend coming in for appointment and continuing to call dental offices (many offer emergency appointments).

## 2020-12-22 NOTE — Telephone Encounter (Signed)
Patient calls nurse line requesting rx for pain medication. Patient was seen in [place] on 2/19 for dental abscess and has been taking tylenol and ibuprofen with little pain relief. Patient is trying to schedule appointment with oral surgeon, however, first available appointment is not until May.   Please advise.   Talbot Grumbling, RN

## 2020-12-23 NOTE — Telephone Encounter (Signed)
Called patient and made aware of below. Patient verbalizes understanding.   Talbot Grumbling, RN

## 2020-12-29 ENCOUNTER — Telehealth (INDEPENDENT_AMBULATORY_CARE_PROVIDER_SITE_OTHER): Payer: Medicaid Other | Admitting: Family Medicine

## 2020-12-29 ENCOUNTER — Encounter: Payer: Self-pay | Admitting: Family Medicine

## 2020-12-29 VITALS — BP 117/69 | HR 74 | Wt 97.0 lb

## 2020-12-29 DIAGNOSIS — F419 Anxiety disorder, unspecified: Secondary | ICD-10-CM | POA: Diagnosis not present

## 2020-12-29 DIAGNOSIS — R197 Diarrhea, unspecified: Secondary | ICD-10-CM | POA: Insufficient documentation

## 2020-12-29 MED ORDER — BUSPIRONE HCL 10 MG PO TABS: 10.0000 mg | ORAL_TABLET | Freq: Two times a day (BID) | ORAL | 2 refills | Status: DC | PRN

## 2020-12-29 NOTE — Progress Notes (Signed)
[place] Telemedicine Visit  Patient consented to have virtual visit and was identified by name and date of birth. Method of visit: Telephone  Encounter participants: Patient: LAVENDER STANKE - located at home Provider: Patriciaann Clan - located at [facility] Others (if applicable):none  Chief Complaint: follow up/GI upset   HPI: [name] is a 55 year old female presenting via phone to discuss the following:  She reports drenching night sweats, increased moodiness, and abdominal cramping for the past several days since Friday/Saturday.  She also notes abdominal pain all over and brown diarrhea for the past 1.5 weeks.  Usually about 3 episodes daily.  She recently had an Augmentin course on 2/19 and has had several rounds of penicillin over the past several months due to poor oral dentition/infection.  Febrile once to 102.72F on Sunday, otherwise no fever but has continued to experience intermittent chills.  No associated vomiting.  Drinking plenty of fluids and urinating.  Reports that the diarrhea seems to be improving, more formed over the past day.  She does have a history of total abdominal hysterectomy with bilateral oophorectomy and appendectomy several years ago.  No known sick contacts, generally stays at home during the day.  Follow-ups scheduled: Oral surgery on 30th for severe poor dentition.  Colonoscopy 10th for routine screening.  [place] neurosurgery on 14th for recurrent lumbar radiculopathy.  Has not heard from pulmonology yet, however referral has been placed.  ROS: per HPI  Pertinent PMHx: Presumed COPD, migraines, anxiety, previous smoker, poor dentition, chronic low back pain, s/p total hysterectomy  Exam:  BP 117/69   Pulse 74   Wt 97 lb (44 kg)   SpO2 94%   BMI 18.94 kg/m   Respiratory: Able to speak in full sentences without concern  Assessment/Plan:  Acute diarrhea Mild improvement since onset, however with associated fever,  worsening abdominal cramping, and recent antibiotic use, do have concern for C. difficile colitis.  Could additionally consider continue medication side effect from Augmentin, viral enteritis, vs COVID-19.  Less concern for COVID-19 is with her known pulmonary disease and unvaccinated-would suspect respiratory manifestation.  Recommended an in person evaluation later this afternoon with stool sample +/- labs/covid testing, however reports she is unable to make it today or in the next few days due to other errands and would come in if not improving the next few days.  Reemphasized concerns and recommendation, however patient would like to continue postponing evaluation.  Encouraged adequate hydration and continued observation of symptoms, should certainly proceed with ED or in person evaluation if refevers, inability to maintain hydration/fatigue, or abdominal pain increases in frequency/severity.    Time spent during visit with patient: 20 minutes  Patriciaann Clan, DO

## 2020-12-29 NOTE — Assessment & Plan Note (Addendum)
Mild improvement since onset, however with associated fever, worsening abdominal cramping, and recent antibiotic use, do have concern for C. difficile colitis.  Could additionally consider continue medication side effect from Augmentin, viral enteritis, vs COVID-19.  Less concern for COVID-19 is with her known pulmonary disease and unvaccinated-would suspect respiratory manifestation.  Recommended an in person evaluation later this afternoon with stool sample +/- labs/covid testing, however reports she is unable to make it today or in the next few days due to other errands and would come in if not improving the next few days.  Reemphasized concerns and recommendation, however patient would like to continue postponing evaluation.  Encouraged adequate hydration and continued observation of symptoms, should certainly proceed with ED or in person evaluation if refevers, inability to maintain hydration/fatigue, or abdominal pain increases in frequency/severity.

## 2021-01-03 ENCOUNTER — Other Ambulatory Visit: Payer: Self-pay | Admitting: Family Medicine

## 2021-01-03 ENCOUNTER — Other Ambulatory Visit: Payer: Self-pay | Admitting: Internal Medicine

## 2021-01-03 ENCOUNTER — Telehealth: Payer: Self-pay | Admitting: *Deleted

## 2021-01-03 DIAGNOSIS — F411 Generalized anxiety disorder: Secondary | ICD-10-CM

## 2021-01-03 LAB — SARS CORONAVIRUS 2 (TAT 6-24 HRS): SARS Coronavirus 2: POSITIVE — AB

## 2021-01-03 NOTE — Telephone Encounter (Signed)
Pt needs to be rescheduled for Colonoscopy at least  10 days out due to positive COVID test on 01/03/21. Left VM for patient.

## 2021-01-04 ENCOUNTER — Encounter: Payer: Self-pay | Admitting: Adult Health

## 2021-01-04 ENCOUNTER — Telehealth: Payer: Self-pay

## 2021-01-04 ENCOUNTER — Other Ambulatory Visit: Payer: Self-pay | Admitting: Adult Health

## 2021-01-04 DIAGNOSIS — U071 COVID-19: Secondary | ICD-10-CM

## 2021-01-04 MED ORDER — MOLNUPIRAVIR EUA 200MG CAPSULE: 4.0000 | ORAL_CAPSULE | Freq: Two times a day (BID) | ORAL | 0 refills | Status: AC

## 2021-01-04 NOTE — Progress Notes (Signed)
Outpatient Oral COVID Treatment Note  I connected with [name] on 01/04/2021/10:52 AM by telephone and verified that I am speaking with the correct person using two identifiers.  I discussed the limitations, risks, security, and privacy concerns of performing an evaluation and management service by telephone and the availability of in person appointments. I also discussed with the patient that there may be a patient responsible charge related to this service. The patient expressed understanding and agreed to proceed.  Patient location: home Provider location: home Others participating in calls, None  Diagnosis: COVID-19 infection  Purpose of visit: Discussion of potential use of Molnupiravir or Paxlovid, a new treatment for mild to moderate COVID-19 viral infection in non-hospitalized patients.   Subjective: Patient is a 55 y.o. female who has been diagnosed with COVID 19 viral infection.  Their symptoms began on 3/7 with cough/fever.    Past Medical History:  Diagnosis Date  . Allergy   . Anemia    during pregnancies  . Anxiety   . Arthritis    hands and hip  . Cancer ([place])   . CAP (community acquired pneumonia) 01/07/2020  . Chronic kidney disease    KIDNEYSTONES  . Cigarette smoker 11/05/2018  . COPD (chronic obstructive pulmonary disease) ([place])   . Depression   . Heart murmur    MITRAL VALVE PROLASP  . High cholesterol   . History of endometriosis   . Incontinence   . Low back pain radiating to both legs 07/25/2020  . Menopausal symptoms   . Menopause 01/09/2012  . Migraine   . Myocardial infarction ([place])    silent 3 years ago  . Night sweats 10/05/2019  . Osteoporosis   . Paresthesia of foot, bilateral 03/28/2019  . Productive cough 12/25/2019  . Renal stone 06/01/2020  . Skin cancer   . Thyroid disease   . Tick bite 02/10/2019    Allergies  Allergen Reactions  . Pseudoeph-Doxylamine-Dm-Apap     REACTION: keeps awake, legs constantly moving  NYQUIL     Current  Outpatient Medications:  .  albuterol (VENTOLIN HFA) 108 (90 Base) MCG/ACT inhaler, Inhale 2 puffs into the lungs every 6 (six) hours as needed for wheezing or shortness of breath., Disp: 18 g, Rfl: 2 .  aspirin 81 MG chewable tablet, Chew 81 mg by mouth daily., Disp: , Rfl:  .  busPIRone (BUSPAR) 10 MG tablet, Take 1 tablet (10 mg total) by mouth 2 (two) times daily as needed., Disp: 60 tablet, Rfl: 2 .  cyclobenzaprine (FLEXERIL) 10 MG tablet, Take 1 tablet (10 mg total) by mouth 3 (three) times daily as needed for muscle spasms., Disp: 30 tablet, Rfl: 0 .  Fluticasone-Umeclidin-Vilant (TRELEGY ELLIPTA) 200-62.5-25 MCG/INH AEPB, Inhale 1 puff into the lungs daily., Disp: 3 each, Rfl: 3 .  gabapentin (NEURONTIN) 300 MG capsule, Take 1 capsule (300 mg total) by mouth 2 (two) times daily., Disp: 90 capsule, Rfl: 3 .  HYDROcodone-acetaminophen (NORCO/VICODIN) 5-325 MG tablet, Take 1 tablet by mouth every 8 (eight) hours as needed for moderate pain., Disp: 8 tablet, Rfl: 0 .  ibuprofen (ADVIL) 800 MG tablet, Take 1 tablet (800 mg total) by mouth 3 (three) times daily. (Patient not taking: Reported on 12/21/2020), Disp: 21 tablet, Rfl: 0 .  MELATONIN PO, Take 1 tablet by mouth at bedtime as needed (sleep). (Patient not taking: Reported on 12/21/2020), Disp: , Rfl:  .  meloxicam (MOBIC) 15 MG tablet, Take 1 tablet (15 mg total) by mouth daily., Disp:  20 tablet, Rfl: 0 .  rosuvastatin (CRESTOR) 10 MG tablet, Take 1 tablet (10 mg total) by mouth at bedtime. Increase to $RemoveBef'20mg'BnbXEwuDeu$  after 1 week if tolerating well., Disp: 60 tablet, Rfl: 0 .  sertraline (ZOLOFT) 100 MG tablet, TAKE 1 TABLET BY MOUTH EVERY DAY, Disp: 90 tablet, Rfl: 2  Objective: Patient appears/sounds tired, and hoarse.  They are in no apparent distress.  Breathing is non labored.  Mood and behavior are normal.  Laboratory Data:  Recent Results (from the past 2160 hour(s))  CBC with Differential     Status: None   Collection Time: 12/10/20 11:14 AM   Result Value Ref Range   WBC 10.2 3.4 - 10.8 x10E3/uL   RBC 4.73 3.77 - 5.28 x10E6/uL   Hemoglobin 14.4 11.1 - 15.9 g/dL   Hematocrit 43.1 34.0 - 46.6 %   MCV 91 79 - 97 fL   MCH 30.4 26.6 - 33.0 pg   MCHC 33.4 31.5 - 35.7 g/dL   RDW 13.9 11.7 - 15.4 %   Platelets 293 150 - 450 x10E3/uL   Neutrophils 68 Not Estab. %   Lymphs 22 Not Estab. %   Monocytes 7 Not Estab. %   Eos 2 Not Estab. %   Basos 1 Not Estab. %   Neutrophils Absolute 6.8 1.4 - 7.0 x10E3/uL   Lymphocytes Absolute 2.2 0.7 - 3.1 x10E3/uL   Monocytes Absolute 0.7 0.1 - 0.9 x10E3/uL   EOS (ABSOLUTE) 0.2 0.0 - 0.4 x10E3/uL   Basophils Absolute 0.1 0.0 - 0.2 x10E3/uL   Immature Granulocytes 0 Not Estab. %   Immature Grans (Abs) 0.0 0.0 - 0.1 x10E3/uL  Comprehensive metabolic panel     Status: None   Collection Time: 12/10/20 11:14 AM  Result Value Ref Range   Glucose 83 65 - 99 mg/dL   BUN 7 6 - 24 mg/dL   Creatinine, Ser 0.74 0.57 - 1.00 mg/dL   GFR calc non Af Amer 92 >59 mL/min/1.73   GFR calc Af Amer 106 >59 mL/min/1.73    Comment: **In accordance with recommendations from the NKF-ASN Task force,**   [facility] is in the process of updating its eGFR calculation to the   2021 CKD-EPI creatinine equation that estimates kidney function   without a race variable.    BUN/Creatinine Ratio 9 9 - 23   Sodium 141 134 - 144 mmol/L   Potassium 4.0 3.5 - 5.2 mmol/L   Chloride 100 96 - 106 mmol/L   CO2 23 20 - 29 mmol/L   Calcium 9.9 8.7 - 10.2 mg/dL   Total Protein 6.9 6.0 - 8.5 g/dL   Albumin 4.4 3.8 - 4.9 g/dL   Globulin, Total 2.5 1.5 - 4.5 g/dL   Albumin/Globulin Ratio 1.8 1.2 - 2.2   Bilirubin Total 0.3 0.0 - 1.2 mg/dL   Alkaline Phosphatase 105 44 - 121 IU/L   AST 16 0 - 40 IU/L   ALT 9 0 - 32 IU/L  Lipid Panel     Status: Abnormal   Collection Time: 12/10/20 11:14 AM  Result Value Ref Range   Cholesterol, Total 308 (H) 100 - 199 mg/dL   Triglycerides 103 0 - 149 mg/dL   HDL 44 >39 mg/dL   VLDL Cholesterol  Cal 18 5 - 40 mg/dL   LDL Chol Calc (NIH) 246 (H) 0 - 99 mg/dL   Chol/HDL Ratio 7.0 (H) 0.0 - 4.4 ratio    Comment:  T. Chol/HDL Ratio                                             Men  Women                               1/2 Avg.Risk  3.4    3.3                                   Avg.Risk  5.0    4.4                                2X Avg.Risk  9.6    7.1                                3X Avg.Risk 23.4   11.0   SARS Coronavirus 2 (TAT 6-24 hrs)     Status: Abnormal   Collection Time: 01/03/21 12:00 AM  Result Value Ref Range   SARS Coronavirus 2 RESULT: POSITIVE (A)     Comment: RESULT: POSITIVESARS-CoV-2 INTERPRETATION:A POSITIVE  test result means that SARS-CoV-2 RNA was present in the specimen above the limit of detection of this test. The presence of SARS-CoV-2 RNA is indicative of infection with SARS-CoV-2. Detection of  SARS-CoV-2 RNA may not rule-out bacterial infection or co-infection with other viruses. Positive and negative predictive values of testing are highly dependent on prevalence. False positive test results are more likely when prevalence of disease is  low.The expected result is NEGATIVE.Fact Sheet for Healthcare Providers: [employer].br Sheet for Patients: [employer].ee Reference Range - Negative      Assessment: 55 y.o. female with mild/moderate COVID 19 viral infection diagnosed on 3/7 at high risk for progression to severe COVID 19.  Plan:  This patient is a 55 y.o. female that meets the following criteria for Emergency Use Authorization of: Molnupiravir  1. Age >18 yr 2. SARS-COV-2 positive test 3. Symptom onset < 5 days 4. Mild-to-moderate COVID disease with high risk for severe progression to hospitalization or death  I discussed Paxlovid, and Sotrovimab, however patient declines IV therapy, and has unavoidable medication interactions for Paxlovid.    I have spoken  and communicated the following to the patient or parent/caregiver regarding: 1. Molnupiravir is an unapproved drug that is authorized for use under an TEFL teacher.  2. There are no adequate, approved, available products for the treatment of COVID-19 in adults who have mild-to-moderate COVID-19 and are at high risk for progressing to severe COVID-19, including hospitalization or death. 3. Other therapeutics are currently authorized. For additional information on all products authorized for treatment or prevention of COVID-19, please see https://www.graham-miller.com/.  4. There are benefits and risks of taking this treatment as outlined in the "Fact Sheet for Patients and Caregivers."  5. "Fact Sheet for Patients and Caregivers" was reviewed with patient. A hard copy will be provided to patient from pharmacy prior to the patient receiving treatment. 6. Patients should continue to self-isolate and use infection control measures (e.g., wear mask, isolate, social distance, avoid sharing personal items, clean and disinfect "high touch" surfaces, and frequent handwashing)  according to [facility] guidelines.  7. The patient or parent/caregiver has the option to accept or refuse treatment. 8. [place] has established a pregnancy surveillance program. 9. Females of childbearing potential should use a reliable method of contraception correctly and consistently, as applicable, for the duration of treatment and for 4 days after the last dose of Molnupiravir. 73. Males of reproductive potential who are sexually active with females of childbearing potential should use a reliable method of contraception correctly and consistently during treatment and for at least 3 months after the last dose. 11. Pregnancy status and risk was assessed. Patient verbalized understanding of precautions.   After reviewing  above information with the patient, the patient agrees to receive molnupiravir.  Follow up instructions:    . Take prescription BID x 5 days as directed . Reach out to pharmacist for counseling on medication if desired . For concerns regarding further COVID symptoms please follow up with your PCP or urgent care . For urgent or life-threatening issues, seek care at your local emergency department  The patient was provided an opportunity to ask questions, and all were answered. The patient agreed with the plan and demonstrated an understanding of the instructions.   Script sent to [facility] and opted to [employer] via mail order (verified patients address for delivery).  The patient was advised to call their PCP or seek an in-person evaluation if the symptoms worsen or if the condition fails to improve as anticipated.   I provided 20  minutes of non face-to-face telephone visit time during this encounter, and > 50% was spent counseling as documented under my assessment & plan.  Scot Dock, NP 01/04/2021 /10:52 AM

## 2021-01-04 NOTE — Telephone Encounter (Signed)
Called to discuss with patient about COVID-19 symptoms and the use of one of the available treatments for those with mild to moderate Covid symptoms and at a high risk of hospitalization.  Pt appears to qualify for outpatient treatment due to co-morbid conditions and/or a member of an at-risk group in accordance with the FDA Emergency Use Authorization.    Symptom onset: 01/03/21 Cough, fever Vaccinated: No Booster? No Immunocompromised? No Qualifiers: COPD  Pt. Would like with APP.  [name]

## 2021-01-05 ENCOUNTER — Telehealth: Payer: Self-pay | Admitting: Family Medicine

## 2021-01-05 NOTE — Telephone Encounter (Signed)
Noted a Covid positive status in my results.  This is likely the etiology of her GI symptoms we discussed last week.  Called her to check in.  States she still feels miserable, but overall getting through it.  Diarrhea has stopped and abdominal cramping has improved.  She overall is having body aches, night sweats, intermittent fever, and overall just feels not great.  She has a pulse ox at home and the lowest is gone is 89%.  Denies any difficulty breathing.  She unfortunately would be out of the window for any monoclonal antibody infusion.  Discussed supportive care and ED precautions including SPO2 going under/equal to 88%, difficulty breathing, confusion, or chest pain.  Reminded her that we do have our sick clinic here and if she is not improving or worsening (pending severity) she can be seen in our office in the afternoons.  Patriciaann Clan, DO

## 2021-01-06 ENCOUNTER — Encounter: Payer: No Typology Code available for payment source | Admitting: Internal Medicine

## 2021-01-09 DIAGNOSIS — Z20822 Contact with and (suspected) exposure to covid-19: Secondary | ICD-10-CM | POA: Diagnosis not present

## 2021-01-11 DIAGNOSIS — R32 Unspecified urinary incontinence: Secondary | ICD-10-CM | POA: Diagnosis not present

## 2021-01-12 ENCOUNTER — Telehealth: Payer: Self-pay

## 2021-01-12 NOTE — Telephone Encounter (Signed)
Patient returns call to nurse line. Spoke with Jazmin and was advised to go ahead and schedule patient in dermatology clinic.   Patient scheduled for 3/31.  Talbot Grumbling, RN

## 2021-01-12 NOTE — Telephone Encounter (Signed)
Patient calls nurse line requesting dermatologist referral. Patient reports suspicious area on back and would like the area to be evaluated. Please advise if patient can be scheduled in dermatology clinic.   Talbot Grumbling, RN

## 2021-01-13 DIAGNOSIS — M25551 Pain in right hip: Secondary | ICD-10-CM | POA: Diagnosis not present

## 2021-01-13 DIAGNOSIS — M5441 Lumbago with sciatica, right side: Secondary | ICD-10-CM | POA: Diagnosis not present

## 2021-01-13 NOTE — Telephone Encounter (Signed)
Thank you, yes that is perfectly fine!   Patriciaann Clan, DO

## 2021-01-20 ENCOUNTER — Other Ambulatory Visit (HOSPITAL_BASED_OUTPATIENT_CLINIC_OR_DEPARTMENT_OTHER): Payer: Self-pay

## 2021-01-22 ENCOUNTER — Other Ambulatory Visit: Payer: Self-pay | Admitting: Family Medicine

## 2021-01-22 DIAGNOSIS — F419 Anxiety disorder, unspecified: Secondary | ICD-10-CM

## 2021-01-27 ENCOUNTER — Ambulatory Visit: Payer: Medicaid Other

## 2021-01-29 ENCOUNTER — Emergency Department (HOSPITAL_COMMUNITY): Payer: Medicaid Other

## 2021-01-29 ENCOUNTER — Emergency Department (HOSPITAL_COMMUNITY)
Admission: EM | Admit: 2021-01-29 | Discharge: 2021-01-30 | Discharge status: Against Medical Advice | Payer: Medicaid Other | Attending: Emergency Medicine | Admitting: Emergency Medicine

## 2021-01-29 DIAGNOSIS — R0602 Shortness of breath: Secondary | ICD-10-CM | POA: Diagnosis not present

## 2021-01-29 DIAGNOSIS — D72829 Elevated white blood cell count, unspecified: Secondary | ICD-10-CM | POA: Diagnosis not present

## 2021-01-29 DIAGNOSIS — Z85828 Personal history of other malignant neoplasm of skin: Secondary | ICD-10-CM | POA: Insufficient documentation

## 2021-01-29 DIAGNOSIS — R062 Wheezing: Secondary | ICD-10-CM | POA: Diagnosis not present

## 2021-01-29 DIAGNOSIS — S92351A Displaced fracture of fifth metatarsal bone, right foot, initial encounter for closed fracture: Secondary | ICD-10-CM | POA: Diagnosis not present

## 2021-01-29 DIAGNOSIS — K047 Periapical abscess without sinus: Secondary | ICD-10-CM | POA: Diagnosis not present

## 2021-01-29 DIAGNOSIS — F17200 Nicotine dependence, unspecified, uncomplicated: Secondary | ICD-10-CM | POA: Diagnosis not present

## 2021-01-29 DIAGNOSIS — J449 Chronic obstructive pulmonary disease, unspecified: Secondary | ICD-10-CM | POA: Diagnosis not present

## 2021-01-29 DIAGNOSIS — N189 Chronic kidney disease, unspecified: Secondary | ICD-10-CM | POA: Diagnosis not present

## 2021-01-29 DIAGNOSIS — R509 Fever, unspecified: Secondary | ICD-10-CM | POA: Diagnosis not present

## 2021-01-29 DIAGNOSIS — S92511A Displaced fracture of proximal phalanx of right lesser toe(s), initial encounter for closed fracture: Secondary | ICD-10-CM | POA: Diagnosis not present

## 2021-01-29 DIAGNOSIS — R000 Tachycardia, unspecified: Secondary | ICD-10-CM | POA: Diagnosis not present

## 2021-01-29 DIAGNOSIS — R0689 Other abnormalities of breathing: Secondary | ICD-10-CM | POA: Diagnosis not present

## 2021-01-29 DIAGNOSIS — Z20822 Contact with and (suspected) exposure to covid-19: Secondary | ICD-10-CM | POA: Insufficient documentation

## 2021-01-29 DIAGNOSIS — J189 Pneumonia, unspecified organism: Secondary | ICD-10-CM | POA: Diagnosis not present

## 2021-01-29 DIAGNOSIS — Z7952 Long term (current) use of systemic steroids: Secondary | ICD-10-CM | POA: Insufficient documentation

## 2021-01-29 DIAGNOSIS — Z7982 Long term (current) use of aspirin: Secondary | ICD-10-CM | POA: Diagnosis not present

## 2021-01-29 DIAGNOSIS — R0902 Hypoxemia: Secondary | ICD-10-CM | POA: Diagnosis not present

## 2021-01-29 DIAGNOSIS — R21 Rash and other nonspecific skin eruption: Secondary | ICD-10-CM | POA: Diagnosis not present

## 2021-01-29 DIAGNOSIS — M7989 Other specified soft tissue disorders: Secondary | ICD-10-CM | POA: Diagnosis not present

## 2021-01-29 DIAGNOSIS — I959 Hypotension, unspecified: Secondary | ICD-10-CM | POA: Diagnosis not present

## 2021-01-29 LAB — CBC WITH DIFFERENTIAL/PLATELET
Abs Immature Granulocytes: 0.05 10*3/uL (ref 0.00–0.07)
Basophils Absolute: 0.1 10*3/uL (ref 0.0–0.1)
Basophils Relative: 1 %
Eosinophils Absolute: 0.2 10*3/uL (ref 0.0–0.5)
Eosinophils Relative: 2 %
HCT: 36.4 % (ref 36.0–46.0)
Hemoglobin: 12.2 g/dL (ref 12.0–15.0)
Immature Granulocytes: 1 %
Lymphocytes Relative: 11 %
Lymphs Abs: 1.2 10*3/uL (ref 0.7–4.0)
MCH: 30.5 pg (ref 26.0–34.0)
MCHC: 33.5 g/dL (ref 30.0–36.0)
MCV: 91 fL (ref 80.0–100.0)
Monocytes Absolute: 0.7 10*3/uL (ref 0.1–1.0)
Monocytes Relative: 6 %
Neutro Abs: 8.7 10*3/uL — ABNORMAL HIGH (ref 1.7–7.7)
Neutrophils Relative %: 79 %
Platelets: 250 10*3/uL (ref 150–400)
RBC: 4 MIL/uL (ref 3.87–5.11)
RDW: 14.1 % (ref 11.5–15.5)
WBC: 11 10*3/uL — ABNORMAL HIGH (ref 4.0–10.5)
nRBC: 0 % (ref 0.0–0.2)

## 2021-01-29 LAB — LACTIC ACID, PLASMA: Lactic Acid, Venous: 1.1 mmol/L (ref 0.5–1.9)

## 2021-01-29 MED ORDER — SODIUM CHLORIDE 0.9 % IV BOLUS
1000.0000 mL | Freq: Once | INTRAVENOUS | Status: AC
  Administered 2021-01-29: 1000 mL via INTRAVENOUS

## 2021-01-29 MED ORDER — IPRATROPIUM BROMIDE 0.02 % IN SOLN
0.5000 mg | Freq: Once | RESPIRATORY_TRACT | Status: DC
  Filled 2021-01-29: qty 2.5

## 2021-01-29 MED ORDER — ACETAMINOPHEN 500 MG PO TABS
1000.0000 mg | ORAL_TABLET | Freq: Once | ORAL | Status: AC
  Administered 2021-01-29: 1000 mg via ORAL
  Filled 2021-01-29: qty 2

## 2021-01-29 MED ORDER — ALBUTEROL SULFATE (2.5 MG/3ML) 0.083% IN NEBU
5.0000 mg | INHALATION_SOLUTION | Freq: Once | RESPIRATORY_TRACT | Status: DC
  Filled 2021-01-29: qty 6

## 2021-01-29 NOTE — ED Notes (Signed)
One set of cultures obtained 

## 2021-01-29 NOTE — ED Provider Notes (Signed)
[facility] EMERGENCY DEPARTMENT Provider Note   CSN: [id number] Arrival date & time: 01/29/21  2212     History Chief Complaint  Patient presents with  . Shortness of Breath    [name] is a 55 y.o. female.  The history is provided by the patient and medical records.    55 y.o. F with hx of seasonal allergies, anxiety, arthritis, CKD, COPD, migraine headaches, bulimia, presenting to the ED with SOB.  Patient states she has been having issues on and off for the past few weeks, got a lot worse over the past 24 hours.  States fever intermittently for the past few days, she attributed this to her dental infection.  She has several teeth that are going to be pulled that are grossly infected, currently on amoxicillin for this.  Dentist has told her this is due to her longstanding bulimia.  She does report persistent cough, however this is somewhat chronic.  She denies any production of mucus or hemoptysis.  She does report some chest tightness and overall "soreness" from coughing so much.  Patient was 90% on room air, placed on 2 L by EMS with good improvement.  She is generally not oxygen dependent.  She also received 10 mg of albuterol, 125 of Solu-Medrol and 300 cc of saline due to mild hypotension.  After breathing treatments, states she is feeling much better.  Past Medical History:  Diagnosis Date  . Allergy   . Anemia    during pregnancies  . Anxiety   . Arthritis    hands and hip  . Cancer ([place])   . CAP (community acquired pneumonia) 01/07/2020  . Chronic kidney disease    KIDNEYSTONES  . Cigarette smoker 11/05/2018  . COPD (chronic obstructive pulmonary disease) ([place])   . Depression   . Heart murmur    MITRAL VALVE PROLASP  . High cholesterol   . History of endometriosis   . Incontinence   . Low back pain radiating to both legs 07/25/2020  . Menopausal symptoms   . Menopause 01/09/2012  . Migraine   . Myocardial infarction ([place])    silent 3 years ago   . Night sweats 10/05/2019  . Osteoporosis   . Paresthesia of foot, bilateral 03/28/2019  . Productive cough 12/25/2019  . Renal stone 06/01/2020  . Skin cancer   . Thyroid disease   . Tick bite 02/10/2019    Patient Active Problem List   Diagnosis Date Noted  . Acute diarrhea 12/29/2020  . Acute right lumbar radiculopathy 12/11/2020  . Healthcare maintenance 12/11/2020  . COPD (chronic obstructive pulmonary disease) ([place]) 06/01/2020  . Discontinued smoking 01/07/2020  . GAD (generalized anxiety disorder) 12/26/2019  . Poor social situation 08/29/2019  . Abnormal chest CT 08/29/2019  . Poor dentition 06/25/2019  . Lung nodule, solitary 06/25/2019  . Chronic cough 11/26/2018  . Recent unintentional weight loss over several months 11/05/2018  . Paresthesia 07/22/2018  . 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 Hyperlipidemia 07/22/2018  . Depression 01/09/2012  . Migraine with aura 05/09/2007  . MITRAL VALVE PROLAPSE, MILD M. R.-SBE PROPHYL 05/09/2007    Past Surgical History:  Procedure Laterality Date  . ANKLE SURGERY     RIGHT  . APPENDECTOMY    . BREAST ENHANCEMENT SURGERY     SALINE  . BUNIONECTOMY     BILATERAL FEET  . TOTAL ABDOMINAL HYSTERECTOMY W/ BILATERAL SALPINGOOPHORECTOMY    . TVT  2007  . WISDOM TOOTH EXTRACTION  X 4     OB History    Gravida  2   Para  2   Term      Preterm      AB      Living  2     SAB      IAB      Ectopic      Multiple      Live Births  2           Family History  Problem Relation Age of Onset  . Diabetes Mother   . Heart disease Mother   . Hyperlipidemia Mother   . Colon polyps Mother   . Tuberculosis Father   . Alcohol abuse Father   . Heart disease Sister   . Hyperlipidemia Sister   . Heart disease Brother   . Hyperlipidemia Brother   . Diabetes Maternal Grandmother   . Diabetes Maternal Grandfather   . Colon cancer Maternal Uncle   . Stomach cancer Maternal Uncle     Social History   Tobacco Use   . Smoking status: Current Every Day Smoker    Packs/day: 0.50    Years: 25.00    Pack years: 12.50  . Smokeless tobacco: Never Used  Vaping Use  . Vaping Use: Never used  Substance Use Topics  . Alcohol use: Yes    Comment: OCCASIONALLY  . Drug use: No    Home Medications Prior to Admission medications   Medication Sig Start Date End Date Taking? Authorizing Provider  albuterol (VENTOLIN HFA) 108 (90 Base) MCG/ACT inhaler Inhale 2 puffs into the lungs every 6 (six) hours as needed for wheezing or shortness of breath. 10/03/19   Patriciaann Clan, DO  aspirin 81 MG chewable tablet Chew 81 mg by mouth daily.    [provider]  busPIRone (BUSPAR) 10 MG tablet TAKE 1 TABLET BY MOUTH 2 TIMES DAILY AS NEEDED. 01/24/21   Patriciaann Clan, DO  cyclobenzaprine (FLEXERIL) 10 MG tablet Take 1 tablet (10 mg total) by mouth 3 (three) times daily as needed for muscle spasms. 07/21/20   Patriciaann Clan, DO  Fluticasone-Umeclidin-Vilant (TRELEGY ELLIPTA) 200-62.5-25 MCG/INH AEPB Inhale 1 puff into the lungs daily. 04/30/20   Brimage, Ronnette Juniper, DO  gabapentin (NEURONTIN) 300 MG capsule Take 1 capsule (300 mg total) by mouth 2 (two) times daily. 12/10/20   Patriciaann Clan, DO  HYDROcodone-acetaminophen (NORCO/VICODIN) 5-325 MG tablet Take 1 tablet by mouth every 8 (eight) hours as needed for moderate pain. 10/09/20   Patriciaann Clan, DO  ibuprofen (ADVIL) 800 MG tablet Take 1 tablet (800 mg total) by mouth 3 (three) times daily. Patient not taking: Reported on 12/21/2020 12/18/20   Wieters, Hallie C, PA-C  MELATONIN PO Take 1 tablet by mouth at bedtime as needed (sleep). Patient not taking: Reported on 12/21/2020    [provider]  meloxicam (MOBIC) 15 MG tablet Take 1 tablet (15 mg total) by mouth daily. 12/10/20   Patriciaann Clan, DO  Molnupiravir 200 MG CAPS TAKE 4 CAPSULES BY MOUTH 2 TIMES DAILY FOR 5 DAYS. 01/04/21 01/04/22  Gardenia Phlegm, NP  rosuvastatin (CRESTOR) 10 MG  tablet Take 1 tablet (10 mg total) by mouth at bedtime. Increase to 20mg  after 1 week if tolerating well. 12/14/20   Patriciaann Clan, DO  sertraline (ZOLOFT) 100 MG tablet TAKE 1 TABLET BY MOUTH EVERY DAY 01/03/21   Patriciaann Clan, DO    Allergies    Pseudoeph-doxylamine-dm-apap  Review of Systems   Review of Systems  Respiratory: Positive for cough, shortness of breath and wheezing.   All other systems reviewed and are negative.   Physical Exam Updated Vital Signs BP (!) 97/59 (BP Location: Right Arm)   Pulse (!) 105   Temp (!) 102.1 F (38.9 C) (Oral)   Resp (!) 24   Ht 5' (1.524 m)   Wt 44 kg   SpO2 95%   BMI 18.94 kg/m   Physical Exam Vitals and nursing note reviewed.  Constitutional:      Appearance: She is well-developed.     Comments: Thin  HENT:     Head: Normocephalic and atraumatic.     Mouth/Throat:     Comments: Dentition generally poor, multiple teeth absent, obvious infection of left lower premolars with gingival inflammation and swelling, no discrete abscess visualized, no lip/tongue, facial, or neck swelling, handling secretions well, no stridor Eyes:     Conjunctiva/sclera: Conjunctivae normal.     Pupils: Pupils are equal, round, and reactive to light.  Cardiovascular:     Rate and Rhythm: Normal rate and regular rhythm.     Heart sounds: Normal heart sounds.  Pulmonary:     Effort: Pulmonary effort is normal.     Breath sounds: Wheezing present.     Comments: Some persistent end expiratory wheezes, mild retractions noted supraclavicularly, speaking in shortened sentences Abdominal:     General: Bowel sounds are normal.     Palpations: Abdomen is soft.  Musculoskeletal:        General: Normal range of motion.     Cervical back: Normal range of motion.  Skin:    General: Skin is warm and dry.  Neurological:     Mental Status: She is alert and oriented to person, place, and time.     ED Results / Procedures / Treatments   Labs (all labs  ordered are listed, but only abnormal results are displayed) Labs Reviewed  CBC WITH DIFFERENTIAL/PLATELET - Abnormal; Notable for the following components:      Result Value   WBC 11.0 (*)    Neutro Abs 8.7 (*)    All other components within normal limits  BASIC METABOLIC PANEL - Abnormal; Notable for the following components:   Sodium 134 (*)    Potassium 3.0 (*)    Glucose, Bld 145 (*)    Calcium 8.7 (*)    All other components within normal limits  RESP PANEL BY RT-PCR (FLU A&B, COVID) ARPGX2  CULTURE, BLOOD (ROUTINE X 2)  CULTURE, BLOOD (ROUTINE X 2)  LACTIC ACID, PLASMA  TROPONIN I (HIGH SENSITIVITY)    EKG None  Radiology DG Chest Port 1 View  Result Date: 01/29/2021 CLINICAL DATA:  55 year old female with fever and shortness of breath. EXAM: PORTABLE CHEST 1 VIEW COMPARISON:  Chest radiograph dated 04/30/2020 FINDINGS: There is diffuse interstitial prominence and nodularity which may represent edema but concerning for pneumonia, likely atypical or viral in etiology. Clinical correlation is recommended. No lobar consolidation, pleural effusion, or pneumothorax. The cardiac silhouette is within limits. No acute osseous pathology. IMPRESSION: Findings concerning for pneumonia. Electronically Signed   By: Anner Crete M.D.   On: 01/29/2021 23:02   DG Toe 5th Right  Result Date: 01/29/2021 CLINICAL DATA:  55 year old female with pain in the right fifth toe. EXAM: RIGHT FIFTH TOE COMPARISON:  None. FINDINGS: There is a mildly displaced fracture of the distal aspect of the proximal phalanx of the fifth digit with mild  dorsal displacement of the distal fracture fragment. No other acute fracture identified. The bones are osteopenic. There is no dislocation. There is mild soft tissue swelling of the fifth digit. No radiopaque foreign object or soft tissue gas. Partially visualized fixation screw in the first metatarsal head and navicular. IMPRESSION: Mildly displaced fracture of the  proximal phalanx of the fifth digit. Electronically Signed   By: Anner Crete M.D.   On: 01/29/2021 23:04    Procedures Procedures   Medications Ordered in ED Medications  albuterol (PROVENTIL) (2.5 MG/3ML) 0.083% nebulizer solution 5 mg (5 mg Nebulization Patient Refused/Not Given 01/30/21 0035)  ipratropium (ATROVENT) nebulizer solution 0.5 mg (0.5 mg Nebulization Patient Refused/Not Given 01/30/21 0035)  sodium chloride 0.9 % bolus 1,000 mL (0 mLs Intravenous Stopped 01/30/21 0035)  acetaminophen (TYLENOL) tablet 1,000 mg (1,000 mg Oral Given 01/29/21 2249)    ED Course  I have reviewed the triage vital signs and the nursing notes.  Pertinent labs & imaging results that were available during my care of the patient were reviewed by me and considered in my medical decision making (see chart for details).    MDM Rules/Calculators/A&P  55 year old female presenting to the ED with shortness of breath.  She has had on and off fevers for the past few days which she attributed to dental infection.  Currently on amoxicillin for this.  States longstanding COPD, worsening trouble over the past 24 hours.  With EMS she was 90% on room air, generally not oxygen dependent.  On arrival to ED she is febrile and remains on 2 L supplemental oxygen.  She continues to have some mild end expiratory phase.  She does have obvious dental infections which may be contributing to fever.  She did have Covid in March 2022.  Will obtain chest x-ray, labs, lactate, blood cultures.  Tylenol, IVF, and nebs ordered.  11:17 PM X-ray results discussed with patient, she acknowledged understanding.  Given her oxygen requirement and new pneumonia, feel she will require admission.  Patient is very hesitant about this as she is scared of hospitals.  I have strongly encouraged her to reconsider, states she will talk to her mother about this who is now at bedside while awaiting the remainder of her lab results.  12:18 AM Labs as  above, mild leukocytosis but normal lactate.  Blood cultures have been sent.  RVP negative.  She remains around 98% on 2 L.  States she does not feel short of breath at this time.  BP is soft, given her small size this may not be off from her baseline but still concerning for impending sepsis. I have again strongly recommended admission, however patient is adamantly refusing.  Her mother remains at the bedside and we have had a long discussion with her about potentially worsening condition, respiratory failure, and potential death.  She acknowledged these risks and still wishes to go home.  She is of sound mind and capable of making her own medical decisions at this time.  She has agreed to sign out AGAINST MEDIAL ADVICE.  Mother states she will stay with her tonight and will allow her to use her nebulizer machine for continued treatments throughout the night.  She also has home pulse ox and will monitor sats.  Will start on antibiotics for CAP, she is already on amoxicillin for dental infection.    Encouraged close follow-up with primary care doctor and dentist.  Return here immediately for any new/worsening symptoms.  PATIENT SIGNED OUT AGAINST MEDICAL  ADVICE.  Final Clinical Impression(s) / ED Diagnoses Final diagnoses:  Community acquired pneumonia, unspecified laterality  Hypoxia  Dental infection    Rx / DC Orders ED Discharge Orders         Ordered    azithromycin (ZITHROMAX Z-PAK) 250 MG tablet  Daily        01/30/21 0022    albuterol (PROVENTIL) (2.5 MG/3ML) 0.083% nebulizer solution  Every 6 hours PRN        01/30/21 0022           Larene Pickett, PA-C 01/30/21 0052    Mesner, Corene Cornea, MD 01/30/21 [phone]

## 2021-01-29 NOTE — ED Triage Notes (Addendum)
Pt arrived via ems due to complaints of sob. EMS reports 90% on room air. They put pt on 2 l . EMS reports non productive cough. Pt has rotten teeth that all need to be removed due to being infected due to her bulimia. Pt is on amoxicillin. EMS reports pt has wheezing, they gave 10 of albuterol,125 solumedrol, EMS reports pts pt bp was 90/50 they gave 300 ml of ns.

## 2021-01-30 ENCOUNTER — Other Ambulatory Visit: Payer: Self-pay | Admitting: Family Medicine

## 2021-01-30 DIAGNOSIS — E7801 Familial hypercholesterolemia: Secondary | ICD-10-CM

## 2021-01-30 DIAGNOSIS — M5416 Radiculopathy, lumbar region: Secondary | ICD-10-CM

## 2021-01-30 LAB — BASIC METABOLIC PANEL
Anion gap: 10 (ref 5–15)
BUN: 10 mg/dL (ref 6–20)
CO2: 23 mmol/L (ref 22–32)
Calcium: 8.7 mg/dL — ABNORMAL LOW (ref 8.9–10.3)
Chloride: 101 mmol/L (ref 98–111)
Creatinine, Ser: 0.71 mg/dL (ref 0.44–1.00)
GFR, Estimated: >60 mL/min (ref >60)
Glucose, Bld: 145 mg/dL — ABNORMAL HIGH (ref 70–99)
Potassium: 3 mmol/L — ABNORMAL LOW (ref 3.5–5.1)
Sodium: 134 mmol/L — ABNORMAL LOW (ref 135–145)

## 2021-01-30 LAB — RESP PANEL BY RT-PCR (FLU A&B, COVID) ARPGX2
Influenza A by PCR: NEGATIVE
Influenza B by PCR: NEGATIVE
SARS Coronavirus 2 by RT PCR: NEGATIVE

## 2021-01-30 LAB — TROPONIN I (HIGH SENSITIVITY): Troponin I (High Sensitivity): 3 ng/L (ref <18)

## 2021-01-30 MED ORDER — AZITHROMYCIN 250 MG PO TABS: 250.0000 mg | ORAL_TABLET | Freq: Every day | ORAL | 0 refills | Status: DC

## 2021-01-30 MED ORDER — ALBUTEROL SULFATE (2.5 MG/3ML) 0.083% IN NEBU: 2.5000 mg | INHALATION_SOLUTION | Freq: Four times a day (QID) | RESPIRATORY_TRACT | 0 refills | Status: DC | PRN

## 2021-01-30 NOTE — ED Notes (Signed)
Pt refused discharge vitals, pt signed out ama. Md Mesner explained the risks of leaving ama. Pt verbalized understanding the risks.

## 2021-01-30 NOTE — Discharge Instructions (Addendum)
You were found to have pneumonia today along with hypoxia and mildly low blood pressure.   We have strongly recommended hospital admission but you have decided to sign out [place]. You are aware of the risk of potentially worsening condition including but not limited to respiratory failure or even death. I do recommend that you at least take the antibiotics and do neb treatments at home.  Use pulse ox and monitor closely. If symptoms worsen, you need to return immediately to the emergency department.

## 2021-01-31 ENCOUNTER — Encounter: Payer: Self-pay | Admitting: Family Medicine

## 2021-01-31 ENCOUNTER — Telehealth: Payer: Self-pay | Admitting: *Deleted

## 2021-01-31 DIAGNOSIS — R32 Unspecified urinary incontinence: Secondary | ICD-10-CM | POA: Diagnosis not present

## 2021-01-31 NOTE — Telephone Encounter (Signed)
Transition Care Management Unsuccessful Follow-up Telephone Call  Date of discharge and from where:  01/30/2021 [name]  Attempts:  1st Attempt  Reason for unsuccessful TCM follow-up call:  Left voice message

## 2021-01-31 NOTE — Telephone Encounter (Signed)
Patient calls nurse line requesting to speak with Dr. Higinio Plan regarding dental clearance form. Checked provider's box to verify we had received form. Form is in provider box for completion.   Patient also wanted to make provider aware that she was in the ED on 01/29/21.  Forwarding to PCP  Talbot Grumbling, RN

## 2021-02-01 ENCOUNTER — Telehealth: Payer: Self-pay | Admitting: Family Medicine

## 2021-02-01 NOTE — Telephone Encounter (Addendum)
Call patient to check in.  Recently seen in the ED and diagnosed with pneumonia.  States she is feeling a little bit better but still not feeling back to her baseline, her pulse ox has remained above 88%.  Reviewed her ED visit, discussed to her presentation that she should have been admitted but understand fortunately she has done well at home.  She is aware of ED precautions if her breathing gets worse or pulse ox remains under 88%.  She also states that she fractured her right fifth toe after dropping a pot on it last week.  X-ray in the ED showing a mildly displaced fracture of the distal aspect of her proximal phalanx.  She endorses that her foot is quite uncomfortable and does not have any pain medication other than Tylenol.  NSAIDs have recently been causing GI upset.  She should be able to conservatively manage this fracture, however will review and ensure she does not need further intervention for this.  Additionally, will likely send in short course of pain medication to get her through.  Lastly, she is curious about her dental clearance.  She is going to have all of her teeth extracted (16) under general anesthesia.  Discussed first that she would need to recover from this current illness, and additionally needs to be evaluated by pulmonology given her poorly controlled lung disease.  Would not feel comfortable clearing her for general anesthesia without being evaluated by specialist first.  She states she is try to call the pulmonologist office several times however she has not heard back.  [facility] pulmonology after conversation and scheduled new patient appointment for 4/26 at 11:15 AM.  Will call patient tomorrow to discuss time and pain medication as above.  Patriciaann Clan, DO

## 2021-02-01 NOTE — Telephone Encounter (Signed)
Transition Care Management Follow-up Telephone Call  Date of discharge and from where: 01/30/[facility]  How have you been since you were released from the hospital? "Feeling a little better"  Any questions or concerns? No  Items Reviewed:  Did the pt receive and understand the discharge instructions provided? Yes   Medications obtained and verified? Yes   Other? No   Any new allergies since your discharge? No   Dietary orders reviewed? No  Do you have support at home? Yes     Functional Questionnaire: (I = Independent and D = Dependent) ADLs: I  Bathing/Dressing- I  Meal Prep- I  Eating- I  Maintaining continence- I  Transferring/Ambulation- I  Managing Meds- I  Follow up appointments reviewed:   PCP Hospital f/u appt confirmed? No  - advised her that if her symptoms don't resolve completely, she needs to call for an appointment.  [facility] f/u appt confirmed? No    Are transportation arrangements needed? No   If their condition worsens, is the pt aware to call PCP or go to the Emergency Dept.? Yes  Was the patient provided with contact information for the PCP's office or ED? Yes  Was to pt encouraged to call back with questions or concerns? Yes

## 2021-02-03 ENCOUNTER — Telehealth: Payer: Self-pay | Admitting: Family Medicine

## 2021-02-03 ENCOUNTER — Other Ambulatory Visit: Payer: Self-pay | Admitting: Family Medicine

## 2021-02-03 DIAGNOSIS — S92901S Unspecified fracture of right foot, sequela: Secondary | ICD-10-CM

## 2021-02-03 MED ORDER — OMEPRAZOLE 20 MG PO CPDR: 20.0000 mg | DELAYED_RELEASE_CAPSULE | Freq: Every day | ORAL | 0 refills | Status: DC

## 2021-02-03 MED ORDER — NAPROXEN 500 MG PO TABS: 500.0000 mg | ORAL_TABLET | Freq: Two times a day (BID) | ORAL | 0 refills | Status: DC

## 2021-02-03 NOTE — Telephone Encounter (Signed)
Patient to check in.  Reviewed x-rays with Dr. Erin Hearing.  Will manage her fracture conservatively.  Recommended obtaining stiff soled shoe and wearing for next month to help with symptoms.  Additionally Rx'd omeprazole to take with naproxen to minimize GI upset.  Also recommended taking always with meals.  She is willing to try this for the next 1-2 days to see if this helps with pain and has minimal GI issues.  Discussed to send me a MyChart message in the next 1-2 days to check in on how she is doing.  Patriciaann Clan, DO

## 2021-02-04 LAB — CULTURE, BLOOD (ROUTINE X 2): Culture: NO GROWTH

## 2021-02-15 ENCOUNTER — Encounter: Payer: Self-pay | Admitting: Family Medicine

## 2021-02-16 ENCOUNTER — Other Ambulatory Visit: Payer: Self-pay | Admitting: Family Medicine

## 2021-02-16 DIAGNOSIS — S92901S Unspecified fracture of right foot, sequela: Secondary | ICD-10-CM

## 2021-02-17 ENCOUNTER — Encounter: Payer: Self-pay | Admitting: Internal Medicine

## 2021-02-17 ENCOUNTER — Ambulatory Visit (AMBULATORY_SURGERY_CENTER): Payer: Medicaid Other | Admitting: Internal Medicine

## 2021-02-17 ENCOUNTER — Other Ambulatory Visit: Payer: Self-pay

## 2021-02-17 VITALS — BP 102/52 | HR 70 | Temp 97.8°F | Resp 16 | Ht 60.1 in | Wt 101.2 lb

## 2021-02-17 DIAGNOSIS — Z1211 Encounter for screening for malignant neoplasm of colon: Secondary | ICD-10-CM | POA: Diagnosis not present

## 2021-02-17 DIAGNOSIS — K635 Polyp of colon: Secondary | ICD-10-CM | POA: Diagnosis not present

## 2021-02-17 DIAGNOSIS — D122 Benign neoplasm of ascending colon: Secondary | ICD-10-CM

## 2021-02-17 MED ORDER — SODIUM CHLORIDE 0.9 % IV SOLN: 500.0000 mL | Freq: Once | INTRAVENOUS | Status: DC

## 2021-02-17 NOTE — Progress Notes (Signed)
Pt Drowsy. VSS. To PACU, report to RN. No anesthetic complications noted.  

## 2021-02-17 NOTE — Op Note (Signed)
[place] Patient Name: [name] Procedure Date: 02/17/2021 3:26 PM MRN: [id number] Endoscopist: Docia Chuck. Henrene Pastor , MD Age: 55 Referring MD:  Date of Birth: 14-Apr-1966 Gender: Female Account #: [id number] Procedure:                Colonoscopy with cold snare polypectomy x1 Indications:              Screening for colorectal malignant neoplasm Medicines:                Monitored Anesthesia Care Procedure:                Pre-Anesthesia Assessment:                           - Prior to the procedure, a History and Physical                            was performed, and patient medications and                            allergies were reviewed. The patient's tolerance of                            previous anesthesia was also reviewed. The risks                            and benefits of the procedure and the sedation                            options and risks were discussed with the patient.                            All questions were answered, and informed consent                            was obtained. Prior Anticoagulants: The patient has                            taken no previous anticoagulant or antiplatelet                            agents. After reviewing the risks and benefits, the                            patient was deemed in satisfactory condition to                            undergo the procedure.                           After obtaining informed consent, the colonoscope                            was passed under direct vision. Throughout the  procedure, the patient's blood pressure, pulse, and                            oxygen saturations were monitored continuously. The                            Olympus [id number]-[id number] (#[id number]) Colonoscope was                            introduced through the anus and advanced to the the                            cecum, identified by appendiceal orifice and                            ileocecal  valve. The ileocecal valve, appendiceal                            orifice, and rectum were photographed. The quality                            of the bowel preparation was excellent. The                            colonoscopy was performed without difficulty. The                            patient tolerated the procedure well. The bowel                            preparation used was SUPREP via split dose                            instruction. Scope In: 3:38:14 PM Scope Out: 3:55:38 PM Scope Withdrawal Time: 0 hours 13 minutes 39 seconds  Total Procedure Duration: 0 hours 17 minutes 24 seconds  Findings:                 A 2 mm polyp was found in the ascending colon. The                            polyp was removed with a cold snare. Resection and                            retrieval were complete.                           Multiple small-mouthed diverticula were found in                            the sigmoid colon.                           The exam was otherwise without abnormality on  direct and retroflexion views. Complications:            No immediate complications. Estimated blood loss:                            None. Estimated Blood Loss:     Estimated blood loss: none. Impression:               - One 2 mm polyp in the ascending colon, removed                            with a cold snare. Resected and retrieved.                           - Diverticulosis in the sigmoid colon.                           - The examination was otherwise normal on direct                            and retroflexion views. Recommendation:           - Repeat colonoscopy in 7 -10 years for                            surveillance.                           - Patient has a contact number available for                            emergencies. The signs and symptoms of potential                            delayed complications were discussed with the                             patient. Return to normal activities tomorrow.                            Written discharge instructions were provided to the                            patient.                           - Resume previous diet.                           - Continue present medications.                           - Await pathology results. Docia Chuck. Henrene Pastor, MD 02/17/2021 4:01:00 PM This report has been signed electronically.

## 2021-02-17 NOTE — Progress Notes (Signed)
Medical history reviewed with no changes noted. VS assessed by C.W 

## 2021-02-17 NOTE — Patient Instructions (Signed)
Thank you for allowing [place] to care for you today!  Resume previous diet and medications today.  Return to your normal daily activities tomorrow.  Recommend next colonoscopy in 7-10 years.  Will make that determination when polyp biopsy results are final,  1-2 weeks.     YOU HAD AN ENDOSCOPIC PROCEDURE TODAY AT [facility]:   Refer to the procedure report that was given to you for any specific questions about what was found during the examination.  If the procedure report does not answer your questions, please call your gastroenterologist to clarify.  If you requested that your care partner not be given the details of your procedure findings, then the procedure report has been included in a sealed envelope for you to review at your convenience later.  YOU SHOULD EXPECT: Some feelings of bloating in the abdomen. Passage of more gas than usual.  Walking can help get rid of the air that was put into your GI tract during the procedure and reduce the bloating. If you had a lower endoscopy (such as a colonoscopy or flexible sigmoidoscopy) you may notice spotting of blood in your stool or on the toilet paper. If you underwent a bowel prep for your procedure, you may not have a normal bowel movement for a few days.  Please Note:  You might notice some irritation and congestion in your nose or some drainage.  This is from the oxygen used during your procedure.  There is no need for concern and it should clear up in a day or so.  SYMPTOMS TO REPORT IMMEDIATELY:   Following lower endoscopy (colonoscopy or flexible sigmoidoscopy):  Excessive amounts of blood in the stool  Significant tenderness or worsening of abdominal pains  Swelling of the abdomen that is new, acute  Fever of 100F or higher   For urgent or emergent issues, a gastroenterologist can be reached at any hour by calling [phone]. Do not use MyChart messaging for urgent concerns.    DIET:  We do recommend a  small meal at first, but then you may proceed to your regular diet.  Drink plenty of fluids but you should avoid alcoholic beverages for 24 hours.  ACTIVITY:  You should plan to take it easy for the rest of today and you should NOT DRIVE or use heavy machinery until tomorrow (because of the sedation medicines used during the test).    FOLLOW UP: Our staff will call the number listed on your records 48-72 hours following your procedure to check on you and address any questions or concerns that you may have regarding the information given to you following your procedure. If we do not reach you, we will leave a message.  We will attempt to reach you two times.  During this call, we will ask if you have developed any symptoms of COVID 19. If you develop any symptoms (ie: fever, flu-like symptoms, shortness of breath, cough etc.) before then, please call [phone].  If you test positive for Covid 19 in the 2 weeks post procedure, please call and report this information to [place].    If any biopsies were taken you will be contacted by phone or by letter within the next 1-3 weeks.  Please call us at [phone] if you have not heard about the biopsies in 3 weeks.    SIGNATURES/CONFIDENTIALITY: You and/or your care partner have signed paperwork which will be entered into your electronic medical record.  These signatures attest to the fact  that that the information above on your After Visit Summary has been reviewed and is understood.  Full responsibility of the confidentiality of this discharge information lies with you and/or your care-partner.

## 2021-02-17 NOTE — Progress Notes (Signed)
Called to room to assist during endoscopic procedure.  Patient ID and intended procedure confirmed with present staff. Received instructions for my participation in the procedure from the performing physician.  

## 2021-02-20 ENCOUNTER — Telehealth: Payer: Self-pay | Admitting: Nurse Practitioner

## 2021-02-20 ENCOUNTER — Other Ambulatory Visit: Payer: Self-pay | Admitting: Nurse Practitioner

## 2021-02-20 DIAGNOSIS — R197 Diarrhea, unspecified: Secondary | ICD-10-CM

## 2021-02-20 MED ORDER — ONDANSETRON 4 MG PO TBDP: 4.0000 mg | ORAL_TABLET | Freq: Three times a day (TID) | ORAL | 0 refills | Status: DC | PRN

## 2021-02-20 NOTE — Telephone Encounter (Signed)
Patient called with nausea and vomiting. She gives a history of an eating disorder ( remote) and has chronic, intermittent vomiting. At 1 am she vomited pieces of her dinner. Also vomiting anything PO this am. She is asking for an anti-emetic ( starts with an O she says). She has also been having diarrhea since bowel prep for colonoscopy on 4/22. She had Amoxicillin two weeks ago for tooth infection. Having 10-11 loose stools a day .    Will call in ODT Zofran 4 mg Q 8 hours prn nausea. Called to [facility] [address]. Will forward message to Rosanne Sack, RN to call patient tomorrow to arrange for C-diff studies given recent antibiotics. She wanted to take Imodium, I asked her not to start until after C-diff studies.

## 2021-02-21 ENCOUNTER — Telehealth: Payer: Self-pay

## 2021-02-21 NOTE — Telephone Encounter (Signed)
LVM

## 2021-02-21 NOTE — Telephone Encounter (Signed)
Order in epic for C diff, pt knows she can come to the lab M-F between the hours of 7:30am-5pm.

## 2021-02-22 ENCOUNTER — Ambulatory Visit: Payer: Medicaid Other | Admitting: Internal Medicine

## 2021-02-22 ENCOUNTER — Encounter: Payer: Self-pay | Admitting: Internal Medicine

## 2021-02-22 ENCOUNTER — Other Ambulatory Visit: Payer: Self-pay

## 2021-02-22 VITALS — BP 98/64 | HR 81 | Temp 98.0°F | Ht 60.0 in | Wt 96.0 lb

## 2021-02-22 DIAGNOSIS — Z8616 Personal history of COVID-19: Secondary | ICD-10-CM | POA: Diagnosis not present

## 2021-02-22 DIAGNOSIS — R053 Chronic cough: Secondary | ICD-10-CM | POA: Diagnosis not present

## 2021-02-22 DIAGNOSIS — R9389 Abnormal findings on diagnostic imaging of other specified body structures: Secondary | ICD-10-CM

## 2021-02-22 DIAGNOSIS — Z7729 Contact with and (suspected ) exposure to other hazardous substances: Secondary | ICD-10-CM | POA: Diagnosis not present

## 2021-02-22 DIAGNOSIS — R0600 Dyspnea, unspecified: Secondary | ICD-10-CM

## 2021-02-22 DIAGNOSIS — Z825 Family history of asthma and other chronic lower respiratory diseases: Secondary | ICD-10-CM | POA: Diagnosis not present

## 2021-02-22 DIAGNOSIS — Z87891 Personal history of nicotine dependence: Secondary | ICD-10-CM | POA: Diagnosis not present

## 2021-02-22 DIAGNOSIS — K089 Disorder of teeth and supporting structures, unspecified: Secondary | ICD-10-CM | POA: Diagnosis not present

## 2021-02-22 DIAGNOSIS — R0609 Other forms of dyspnea: Secondary | ICD-10-CM

## 2021-02-22 NOTE — Patient Instructions (Addendum)
ICD-10-CM   1. Dyspnea on exertion  R06.00   2. Chronic cough  R05.3   3. Abnormal chest CT  R93.89   4. Personal history of COVID-19  Z86.16   5. Family history of COPD (chronic obstructive pulmonary disease)  Z82.5   6. Stopped smoking with greater than 25 pack year history  Z87.891   7. Exposure to smoke from wood stove  Z77.29   8. Poor dentition  K08.9     Need to rule out asthma v copd v pulmonary fibrosis Given recent covid early march 2022 - cannot do PFt till mid June 2022 Congratulations on quitting smoking  Plan  - do cbc with diff, iGE and RAST allergy panel - do blood alpha 1 AT phenotype - do HRCT supine and prone - continue trelegy for now - remain in smoking remissin  Followup  =- with app in few weeks but after completing above  - preop evl for Dr Arnoldo Morale DDS at followup    - might need cardiac evaluation if CT shows any cardiac issues

## 2021-02-22 NOTE — Progress Notes (Signed)
OV 02/22/2021  Subjective:  Patient ID: [name], female , DOB: 1966/06/12 , age 55 y.o. , MRN: [id number] , ADDRESS: [place] [id number] PCP Patriciaann Clan, DO Patient Care Team: Patriciaann Clan, DO as PCP - General (Family Medicine) Alda Berthold, DO as Consulting Physician (Neurology)  This Provider for this visit: Treatment Team:  Attending Provider: Brand Males, MD    02/22/2021 -   Chief Complaint  Patient presents with  . Consult    Hx of COPD, nodules on CT scan a year or 2 ago.  Pna on 4/2.     HPI [name] 55 y.o. -presents for pulm evaluation because of chronic cough and shortness of breath.  She tells me that she has cough and shortness of breath for few to several years.  Slowly it has been progressive.  In March 2022 she had COVID-19.  Treated as outpatient.  This did not change her baseline symptoms.  She feels symptoms are significant as documented below.  There is associated wheezing.  She is on Trelegy and this helps her.  Albuterol also helps.  She says she has been given a diagnosis of COPD based on CT scan of the chest and symptoms.  She says she has had exacerbations treated by prednisone 3 times as outpatient.  This is also helped her symptoms.  She has over 25 pack smoking history.  Probably over 30 pack.  She quit for many years and then finally started smoking again 5 years ago because of stressors in life.  She finally quit again 2 weeks ago although she says it is very hard to maintain remission.  Even in the years when she quit smoking she was still exposed to the wood smoke.  Her mom continues to smoke and she is exposed to passive smoke.  Her mom has COPD so this family history of COPD.  Unclear if this alpha-1 antitrypsin phenotype.   Of note: She has poor dentition.  She is seen Dr. Gretta Cool in [place] oral surgery.  Given her chronic cough and wheezing and shortness of breath they wanted  pulmonary clearance.  SYMPTOM SCALE - Pulm 02/22/2021   O2 use ra  Shortness of Breath 0 -> 5 scale with 5 being worst (score 6 If unable to do)  At rest 2  Simple tasks - showers, clothes change, eating, shaving 2  Household (dishes, doing bed, laundry) 3.5  Shopping 4  Walking level at own pace 2  Walking up Stairs 3  Total (30-36) Dyspnea Score 18.5  How bad is your cough? 4  How bad is your fatigue 5  How bad is nausea 4  How bad is vomiting?  2  How bad is diarrhea? 0  How bad is anxiety? 5  How bad is depression 5     Exposure hx  - She smoked from age 31 to age 73 x 1 pack/day.  Then she quit till age 73 and for the last 5 years has been smoking 1 pack a day again and then quit 2 weeks ago.  Total smoking history then becomes 3 pack/day  -She owns a large property according to history.  Unclear if it is rule out commercial or residential line.  Nevertheless she does burn a lot of wood on a regular basis constantly and gets exposed to the wood smoke.  Although does not stove is wood smoke exposure there is significant  -No roaches  or mildew in the house.  No mold in the house.  No bird feather in the house.  No pet birds or parakeets.  No gerbils no hamsters.  No down pillow.  No feather jackets.  There are no roaches in the house.  CT Chest data dec 2020   IMPRESSION: Peribronchial thickening with persistent bibasilar ground-glass infiltrates which could be infectious or inflammatory.  Resolution of majority of mucous plugging since previous exam.  Atherosclerotic calcifications including coronary arteries.  Resolution of previously identified 8 mm LEFT upper lobe ground-glass nodule.  Aortic Atherosclerosis (ICD10-I70.0).   Electronically Signed   By: Lavonia Dana M.D.   On: 10/15/2019 08:59    1 Patient Communication  Component 1 mo ago 3/722  SARS Coronavirus 2 RESULT: POSITIVEAbnormal        PFT  No flowsheet data found.  Simple  office walk 185 feet x  3 laps goal with forehead probe 02/22/2021   O2 used ra  Number laps completed 3  Comments about pace x  Resting Pulse Ox/HR 92% and 77/min  Final Pulse Ox/HR 97% and 88/min  Desaturated </= 88% no  Desaturated <= 3% points no  Got Tachycardic >/= 90/min no  Symptoms at end of test no  Miscellaneous comments x    Results for [name] (MRN [id number]) as of 02/22/2021 11:48  Ref. Range 12/10/2020 11:14  Creatinine Latest Ref Range: 0.57 - 1.00 mg/dL 0.74  Results for [name] (MRN [id number]) as of 02/22/2021 11:48  Ref. Range 02/13/2011 15:21 12/18/2016 19:51 01/29/2021 22:30  Eosinophils Absolute Latest Ref Range: 0.0 - 0.5 K/uL 0.0 0.2 0.2  Results for [name] (MRN [id number]) as of 02/22/2021 11:48  Ref. Range 01/17/2016 10:08  D-Dimer, Quant Latest Ref Range: 0.00 - 0.50 ug/mL-FEU <0.27    has a past medical history of Allergy, Anemia, Anxiety, Arthritis, Cancer ([place]), CAP (community acquired pneumonia) (01/07/2020), Chronic kidney disease, Cigarette smoker (11/05/2018), COPD (chronic obstructive pulmonary disease) ([place]), Depression, Heart murmur, High cholesterol, History of endometriosis, Incontinence, Low back pain radiating to both legs (07/25/2020), Menopausal symptoms, Menopause (01/09/2012), Migraine, Myocardial infarction [facility]), Night sweats (10/05/2019), Osteoporosis, Paresthesia of foot, bilateral (03/28/2019), Productive cough (12/25/2019), Renal stone (06/01/2020), Skin cancer, Thyroid disease, and Tick bite (02/10/2019).   reports that she quit smoking about 2 weeks ago. Her smoking use included cigarettes. She has a 19.00 pack-year smoking history. She has never used smokeless tobacco.  Past Surgical History:  Procedure Laterality Date  . ANKLE SURGERY     RIGHT  . APPENDECTOMY    . BREAST ENHANCEMENT SURGERY     SALINE  . BUNIONECTOMY     BILATERAL FEET  . TOTAL ABDOMINAL HYSTERECTOMY W/ BILATERAL SALPINGOOPHORECTOMY    . TVT  2007  .  WISDOM TOOTH EXTRACTION     X 4    Allergies  Allergen Reactions  . Pseudoeph-Doxylamine-Dm-Apap     REACTION: keeps awake, legs constantly moving  [place]    Immunization History  Administered Date(s) Administered  . Influenza,inj,Quad PF,6+ Mos 12/10/2020  . Pneumococcal-Unspecified 02/10/2021  . Td 05/30/2002  . Tdap 11/12/2011, 02/16/2021  . Zoster Recombinat (Shingrix) 02/16/2021    Family History  Problem Relation Age of Onset  . Diabetes Mother   . Heart disease Mother   . Hyperlipidemia Mother   . Colon polyps Mother   . Tuberculosis Father   . Alcohol abuse Father   . Heart disease Sister   . Hyperlipidemia Sister   .  Heart disease Brother   . Hyperlipidemia Brother   . Diabetes Maternal Grandmother   . Diabetes Maternal Grandfather   . Colon cancer Maternal Uncle   . Stomach cancer Maternal Uncle      Current Outpatient Medications:  .  albuterol (PROVENTIL) (2.5 MG/3ML) 0.083% nebulizer solution, Take 3 mLs (2.5 mg total) by nebulization every 6 (six) hours as needed for wheezing or shortness of breath., Disp: 75 mL, Rfl: 0 .  aspirin 81 MG chewable tablet, Chew 81 mg by mouth daily., Disp: , Rfl:  .  busPIRone (BUSPAR) 10 MG tablet, TAKE 1 TABLET BY MOUTH 2 TIMES DAILY AS NEEDED., Disp: 180 tablet, Rfl: 1 .  Fluticasone-Umeclidin-Vilant (TRELEGY ELLIPTA) 200-62.5-25 MCG/INH AEPB, Inhale 1 puff into the lungs daily., Disp: 3 each, Rfl: 3 .  gabapentin (NEURONTIN) 300 MG capsule, Take 1 capsule (300 mg total) by mouth 2 (two) times daily., Disp: 90 capsule, Rfl: 3 .  omeprazole (PRILOSEC) 20 MG capsule, TAKE 1 CAPSULE BY MOUTH EVERY DAY, Disp: 30 capsule, Rfl: 0 .  ondansetron (ZOFRAN ODT) 4 MG disintegrating tablet, Take 1 tablet (4 mg total) by mouth every 8 (eight) hours as needed for nausea or vomiting., Disp: 30 tablet, Rfl: 0 .  rosuvastatin (CRESTOR) 10 MG tablet, TAKE 1 TABLET (10 MG TOTAL) BY MOUTH AT BEDTIME. INCREASE TO 20MG  AFTER 1 WEEK IF  TOLERATING WELL., Disp: 90 tablet, Rfl: 1 .  sertraline (ZOLOFT) 100 MG tablet, TAKE 1 TABLET BY MOUTH EVERY DAY, Disp: 90 tablet, Rfl: 2 .  HYDROcodone-acetaminophen (NORCO/VICODIN) 5-325 MG tablet, Take 1 tablet by mouth every 8 (eight) hours as needed for moderate pain. (Patient not taking: No sig reported), Disp: 8 tablet, Rfl: 0 .  naproxen (NAPROSYN) 500 MG tablet, Take 1 tablet (500 mg total) by mouth 2 (two) times daily with a meal. (Patient not taking: Reported on 02/17/2021), Disp: 30 tablet, Rfl: 0      Objective:   Vitals:   02/22/21 1114  BP: 98/64  Pulse: 81  Temp: 98 F (36.7 C)  TempSrc: Temporal  SpO2: 98%  Weight: 96 lb (43.5 kg)  Height: 5' (1.524 m)    Estimated body mass index is 18.75 kg/m as calculated from the following:   Height as of this encounter: 5' (1.524 m).   Weight as of this encounter: 96 lb (43.5 kg).  @WEIGHTCHANGE @  [employer]   02/22/21 1114  Weight: 96 lb (43.5 kg)     Physical Exam  General Appearance:    Alert, cooperative, no distress, appears stated age - ra , Deconditioned looking - no , OBESE  - no, Sitting on Wheelchair -  no  Head:    Normocephalic, without obvious abnormality, atraumatic  Eyes:    PERRL, conjunctiva/corneas clear,  Ears:    Normal TM's and external ear canals, both ears  Nose:   Nares normal, septum midline, mucosa normal, no drainage    or sinus tenderness. OXYGEN ON  - no . Patient is @ ra   Throat:   Lips, mucosa, and tongue normal; teeth and gums - CARIES POOR DENTITION. Cyanosis on lips - no  Neck:   Supple, symmetrical, trachea midline, no adenopathy;    thyroid:  no enlargement/tenderness/nodules; no carotid   bruit or JVD  Back:     Symmetric, no curvature, ROM normal, no CVA tenderness  Lungs:     Distress - no , Wheeze yes, Barrell Chest - no, Purse lip breathing - no, Crackles - scatered  with wheee   Chest Wall:    No tenderness or deformity.    Heart:    Regular rate and rhythm, S1 and S2  normal, no rub   or gallop, Murmur - no  Breast Exam:    NOT DONE  Abdomen:     Soft, non-tender, bowel sounds active all four quadrants,    no masses, no organomegaly. Visceral obesity - no  Genitalia:   NOT DONE  Rectal:   NOT DONE  Extremities:   Extremities - normal, Has Cane - no, Clubbing - yes, Edema - no  Pulses:   2+ and symmetric all extremities  Skin:   Stigmata of Connective Tissue Disease - no  Lymph nodes:   Cervical, supraclavicular, and axillary nodes normal  Psychiatric:  Neurologic:   Pleasant - yes, Anxious - no, Flat affect - no  CAm-ICU - neg, Alert and Oriented x 3 - yes, Moves all 4s - yes, Speech - normal, Cognition - intact       Assessment:       ICD-10-CM   1. Dyspnea on exertion  R06.00 CBC with Differential/Platelet    IgE    Resp Allergy Profile Regn2DC DE [facility] [place] [facility]    Alpha-1 antitrypsin phenotype    CT Chest High Resolution  2. Chronic cough  R05.3 CBC with Differential/Platelet    IgE    Resp Allergy Profile Regn2DC DE MD [place] [facility]    Alpha-1 antitrypsin phenotype  3. Abnormal chest CT  R93.89 CT Chest High Resolution  4. Personal history of COVID-19  Z86.16 CBC with Differential/Platelet    IgE    Resp Allergy Profile Regn2DC DE MD [place] [facility]    Alpha-1 antitrypsin phenotype  5. Family history of COPD (chronic obstructive pulmonary disease)  Z82.5   6. Stopped smoking with greater than 25 pack year history  Z87.891   7. Exposure to smoke from wood stove  Z77.29 CBC with Differential/Platelet    IgE    Resp Allergy Profile Regn2DC DE MD [place] [facility]    Alpha-1 antitrypsin phenotype  8. Poor dentition  K08.9        Plan:     Patient Instructions     ICD-10-CM   1. Dyspnea on exertion  R06.00   2. Chronic cough  R05.3   3. Abnormal chest CT  R93.89   4. Personal history of COVID-19  Z86.16   5. Family history of COPD (chronic obstructive pulmonary disease)  Z82.5   6. Stopped smoking with greater than 25 pack year history  Z87.891   7. Exposure to  smoke from wood stove  Z77.29   8. Poor dentition  K08.9     Need to rule out asthma v copd v pulmonary fibrosis Given recent covid early march 2022 - cannot do PFt till mid June 2022 Congratulations on quitting smoking  Plan  - do cbc with diff, iGE and RAST allergy panel - do blood alpha 1 AT phenotype - do HRCT supine and prone - continue trelegy for now - remain in smoking remissin  Followup  =- with app in few weeks but after completing above  - preop evl for Dr Arnoldo Morale DDS at followup    - might need cardiac evaluation if CT shows any cardiac issues     SIGNATURE    Dr. Brand Males, M.D., F.C.C.P,  Pulmonary and Critical Care Medicine Staff Physician, [place] Director - Interstitial Lung Disease  Program  [facility] -  [place] at [place], [place], [id number]  Pager: [phone], If no answer or between  15:00h - 7:00h: call [phone] Telephone: [phone]  12:25 PM 02/22/2021

## 2021-02-23 ENCOUNTER — Telehealth: Payer: Self-pay | Admitting: Family Medicine

## 2021-02-23 DIAGNOSIS — K0889 Other specified disorders of teeth and supporting structures: Secondary | ICD-10-CM

## 2021-02-23 MED ORDER — HYDROCODONE-ACETAMINOPHEN 5-325 MG PO TABS: 1.0000 | ORAL_TABLET | Freq: Four times a day (QID) | ORAL | 0 refills | Status: DC | PRN

## 2021-02-23 NOTE — Telephone Encounter (Signed)
Called [name] to discuss.  She has been having a severe tooth pain, she is already been seen by dentist who plans to take out all of her teeth.  Unfortunately she also has poorly controlled lung disease, and being evaluated by pulmonology prior to clearance for general anesthesia.  This puts her in a difficult place as she continues to have severe pain related to her cracked/broken teeth, however cannot resolve the situation until she is cleared.  She has been using maximum doses of Tylenol and ice with minimal relief.  Just had an antibiotic course from her dentist that she completed that helped some but still severe.  Tolerating soft foods and liquids okay.   Rx'd Norco 5-3 25 short supply for severe pain.  Scheduled follow-up with myself on Monday to discuss further prescription.  Patriciaann Clan, DO

## 2021-02-25 ENCOUNTER — Other Ambulatory Visit: Payer: Self-pay | Admitting: *Deleted

## 2021-02-25 ENCOUNTER — Other Ambulatory Visit (INDEPENDENT_AMBULATORY_CARE_PROVIDER_SITE_OTHER): Payer: Medicaid Other

## 2021-02-25 DIAGNOSIS — R053 Chronic cough: Secondary | ICD-10-CM | POA: Diagnosis not present

## 2021-02-25 DIAGNOSIS — Z8616 Personal history of COVID-19: Secondary | ICD-10-CM

## 2021-02-25 DIAGNOSIS — R0600 Dyspnea, unspecified: Secondary | ICD-10-CM

## 2021-02-25 DIAGNOSIS — Z7729 Contact with and (suspected ) exposure to other hazardous substances: Secondary | ICD-10-CM

## 2021-02-25 DIAGNOSIS — R0609 Other forms of dyspnea: Secondary | ICD-10-CM

## 2021-02-25 LAB — CBC WITH DIFFERENTIAL/PLATELET
Basophils Absolute: 0.1 10*3/uL (ref 0.0–0.1)
Basophils Relative: 1.5 % (ref 0.0–3.0)
Eosinophils Absolute: 0.7 10*3/uL (ref 0.0–0.7)
Eosinophils Relative: 8.8 % — ABNORMAL HIGH (ref 0.0–5.0)
HCT: 39.1 % (ref 36.0–46.0)
Hemoglobin: 13 g/dL (ref 12.0–15.0)
Lymphocytes Relative: 24.6 % (ref 12.0–46.0)
Lymphs Abs: 1.8 10*3/uL (ref 0.7–4.0)
MCHC: 33.2 g/dL (ref 30.0–36.0)
MCV: 90.3 fl (ref 78.0–100.0)
Monocytes Absolute: 0.4 10*3/uL (ref 0.1–1.0)
Monocytes Relative: 4.8 % (ref 3.0–12.0)
Neutro Abs: 4.5 10*3/uL (ref 1.4–7.7)
Neutrophils Relative %: 60.3 % (ref 43.0–77.0)
Platelets: 238 10*3/uL (ref 150.0–400.0)
RBC: 4.33 Mil/uL (ref 3.87–5.11)
RDW: 16.6 % — ABNORMAL HIGH (ref 11.5–15.5)
WBC: 7.5 10*3/uL (ref 4.0–10.5)

## 2021-02-25 NOTE — Addendum Note (Signed)
Addended by: Isaiah Serge D on: 02/25/2021 10:12 AM   Modules accepted: Orders

## 2021-02-27 DIAGNOSIS — R32 Unspecified urinary incontinence: Secondary | ICD-10-CM | POA: Diagnosis not present

## 2021-02-28 ENCOUNTER — Encounter: Payer: Self-pay | Admitting: Internal Medicine

## 2021-02-28 ENCOUNTER — Ambulatory Visit: Payer: Medicaid Other

## 2021-02-28 DIAGNOSIS — H5213 Myopia, bilateral: Secondary | ICD-10-CM | POA: Diagnosis not present

## 2021-02-28 LAB — RESPIRATORY ALLERGY PROFILE REGION II ~~LOC~~

## 2021-02-28 LAB — INTERPRETATION:

## 2021-02-28 NOTE — Progress Notes (Deleted)
    SUBJECTIVE:   CHIEF COMPLAINT / HPI: Discuss antibiotics/pain medication   [name] is a 55 year old female presenting to discuss tooth pain.  She currently is awaiting removal of all her teeth by her dentist, however additionally awaiting pulmonology clearance to have this completed due to severe lung disease.    PERTINENT  PMH / PSH: Lung disease (presumed COPD, not formally diagnosed), migraines, anxiety, previous smoker, poor dentition,chronic low back pain,s/p total hysterectomy  OBJECTIVE:   There were no vitals taken for this visit.  ***  ASSESSMENT/PLAN:   No problem-specific Assessment & Plan notes found for this encounter.     Patriciaann Clan, [place]   {    This will disappear when note is signed, click to select method of visit    :1}

## 2021-03-02 ENCOUNTER — Other Ambulatory Visit: Payer: Self-pay

## 2021-03-02 ENCOUNTER — Other Ambulatory Visit: Payer: Self-pay | Admitting: Family Medicine

## 2021-03-02 ENCOUNTER — Telehealth (INDEPENDENT_AMBULATORY_CARE_PROVIDER_SITE_OTHER): Payer: Medicaid Other | Admitting: Family Medicine

## 2021-03-02 DIAGNOSIS — R053 Chronic cough: Secondary | ICD-10-CM

## 2021-03-02 DIAGNOSIS — Z87891 Personal history of nicotine dependence: Secondary | ICD-10-CM | POA: Diagnosis not present

## 2021-03-02 DIAGNOSIS — F4321 Adjustment disorder with depressed mood: Secondary | ICD-10-CM | POA: Diagnosis not present

## 2021-03-02 DIAGNOSIS — K089 Disorder of teeth and supporting structures, unspecified: Secondary | ICD-10-CM

## 2021-03-02 DIAGNOSIS — Z716 Tobacco abuse counseling: Secondary | ICD-10-CM

## 2021-03-02 MED ORDER — VARENICLINE TARTRATE 0.5 MG PO TABS: ORAL_TABLET | ORAL | 1 refills | Status: DC

## 2021-03-02 NOTE — Assessment & Plan Note (Signed)
Fortunately finally has been able to be evaluated by pulmonology.  Awaiting CT high resolution and PFTs.  Continuing on Trelegy.

## 2021-03-02 NOTE — Assessment & Plan Note (Signed)
Her nephew is currently hospitalized in critical condition, provided supportive listening.  Fortunately has good family support at this time.

## 2021-03-02 NOTE — Assessment & Plan Note (Signed)
Restarted recently after increased life stressors.  Currently 10 cigarettes daily.  Interested in Chantix, will send Rx.  Discussed common side effects including abnormal dreams/nightmares, sleepwalking, changes in mood.

## 2021-03-02 NOTE — Assessment & Plan Note (Signed)
Awaiting awaiting pulmonology clearance for removal of all of her teeth via her dentist.  Will clarify with her dentist on specific anesthesia required, as she recently had a colonoscopy with sedation and tolerated this well.  Tentatively would like to get this scheduled as soon as possible as this is largely impacting her quality of life and health.

## 2021-03-02 NOTE — Progress Notes (Signed)
[facility] Visit  Patient consented to have virtual visit and was identified by name and date of birth. Method of visit: Telephone  Encounter participants: Patient: [name] - located at home  Provider: Patriciaann Clan - located at [facility]  Others (if applicable):   Chief Complaint: Check in teeth/smoking cessation   HPI: [name] is a 55 year old female presenting via telephone to discuss the following:  Teeth pain: Still on amoxicillin 500 mg twice daily from her dentist, has approximately 3 more days left.  She has been on antibiotics frequently due to her severe dentition.  Norco has helped some with the severe pain, still taking maximum doses of Tylenol.  Has 8 tablets of Norco left.  Awaiting clearance to have all of her teeth removed.  Tobacco cessation: Reports that she was doing well at completely stopping smoking, however recently picked it back up after her nephew was admitted to the hospital in the past few weeks.  She reports he is currently on life support and that her family is very overwhelmed with this, he is 58.  She is now smoke about 10 cigarettes daily.  She has been trying to use the patches but they cause a bad rash in the area.  Cannot do lozenges or gum and due to her teeth.  Would like to try Chantix as her friend had good success with this in the past, she has not tried it before.  Did not have benefit with Wellbutrin in the past.  Lung evaluation: She has her high resolution CT scheduled for 5/6.  Would not have PFTs done until June due to recent [place].  She denies any recent change in her shortness of breath, cough, sputum production.  Feels that it is at her baseline (often has chronic cough, short winded).  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  Will clarify with her dentist on specific anesthesia required, as she recently had a colonoscopy with sedation and tolerated this well.  Tentatively would like to get this scheduled as soon as possible as this is largely impacting her quality of life and health.  Chronic cough Fortunately finally has been able to be evaluated by pulmonology.  Awaiting CT high resolution and PFTs.  Continuing on Trelegy.  Discontinued smoking Restarted recently after increased life stressors.  Currently 10 cigarettes daily.  Interested in Chantix, will send Rx.  Discussed common side effects including abnormal dreams/nightmares, sleepwalking, changes in mood.  Grief reaction Her nephew is currently hospitalized in critical condition, provided supportive listening.  Fortunately has good family support at this time.    Time spent during visit with patient: 15 minutes  Patriciaann Clan, DO

## 2021-03-03 ENCOUNTER — Telehealth: Payer: Self-pay | Admitting: Internal Medicine

## 2021-03-03 ENCOUNTER — Other Ambulatory Visit: Payer: Self-pay | Admitting: Family Medicine

## 2021-03-03 DIAGNOSIS — S92901S Unspecified fracture of right foot, sequela: Secondary | ICD-10-CM

## 2021-03-03 NOTE — Telephone Encounter (Signed)
Blood allergy test is negative Only blood eos slightly high [place] CT chest

## 2021-03-04 ENCOUNTER — Ambulatory Visit
Admission: RE | Admit: 2021-03-04 | Discharge: 2021-03-04 | Disposition: A | Discharge status: Home / Self Care | Payer: Medicaid Other | Source: Ambulatory Visit | Attending: Internal Medicine | Admitting: Internal Medicine

## 2021-03-04 DIAGNOSIS — R918 Other nonspecific abnormal finding of lung field: Secondary | ICD-10-CM | POA: Diagnosis not present

## 2021-03-04 DIAGNOSIS — R0600 Dyspnea, unspecified: Secondary | ICD-10-CM

## 2021-03-04 DIAGNOSIS — R0609 Other forms of dyspnea: Secondary | ICD-10-CM

## 2021-03-04 DIAGNOSIS — R9389 Abnormal findings on diagnostic imaging of other specified body structures: Secondary | ICD-10-CM

## 2021-03-07 ENCOUNTER — Telehealth: Payer: Self-pay | Admitting: Primary Care

## 2021-03-07 NOTE — Progress Notes (Signed)
@Patient  ID: [name], female    DOB: 03-06-1966, 55 y.o.   MRN: [id number]  Chief Complaint  Patient presents with  . Follow-up    Coughing still, but feels better.     Referring provider: Patriciaann Clan, DO  HPI: 55 year old female, former smoker quit in April 2022. PMH significant for COPD, chronic cough. Patient of Dr. Chase Caller, see for initial consult on 02/22/21 for dyspnea on exertion.   Previous [facility] pulmonary encounter: 02/22/21- Dr. Arbutus Leas M Reeder 55 y.o. -presents for pulm evaluation because of chronic cough and shortness of breath.  She tells me that she has cough and shortness of breath for few to several years.  Slowly it has been progressive.  In March 2022 she had COVID-19.  Treated as outpatient.  This did not change her baseline symptoms.  She feels symptoms are significant as documented below.  There is associated wheezing.  She is on Trelegy and this helps her.  Albuterol also helps.  She says she has been given a diagnosis of COPD based on CT scan of the chest and symptoms.  She says she has had exacerbations treated by prednisone 3 times as outpatient.  This is also helped her symptoms.  She has over 25 pack smoking history.  Probably over 30 pack.  She quit for many years and then finally started smoking again 5 years ago because of stressors in life.  She finally quit again 2 weeks ago although she says it is very hard to maintain remission.  Even in the years when she quit smoking she was still exposed to the wood smoke.  Her mom continues to smoke and she is exposed to passive smoke.  Her mom has COPD so this family history of COPD.  Unclear if this alpha-1 antitrypsin phenotype.   Of note: She has poor dentition.  She is seen Dr. Gretta Cool in [place] oral surgery. Given her chronic cough and wheezing and shortness of breath they wanted pulmonary clearance.  SYMPTOM SCALE - Pulm 02/22/2021   O2 use ra  Shortness of Breath 0 -> 5 scale with  5 being worst (score 6 If unable to do)  At rest 2  Simple tasks - showers, clothes change, eating, shaving 2  Household (dishes, doing bed, laundry) 3.5  Shopping 4  Walking level at own pace 2  Walking up Stairs 3  Total (30-36) Dyspnea Score 18.5  How bad is your cough? 4  How bad is your fatigue 5  How bad is nausea 4  How bad is vomiting?  2  How bad is diarrhea? 0  How bad is anxiety? 5  How bad is depression 5     Exposure hx  - She smoked from age 42 to age 61 x 1 pack/day.  Then she quit till age 89 and for the last 5 years has been smoking 1 pack a day again and then quit 2 weeks ago.  Total smoking history then becomes 73 pack/day  -She owns a large property according to history.  Unclear if it is rule out commercial or residential line.  Nevertheless she does burn a lot of wood on a regular basis constantly and gets exposed to the wood smoke.  Although does not stove is wood smoke exposure there is significant  -No roaches or mildew in the house.  No mold in the house.  No bird feather in the house.  No pet birds or parakeets.  No  gerbils no hamsters.  No down pillow.  No feather jackets.  There are no roaches in the house.  03/08/2021- Interim hx  Patient presents today for follow-up. During last visit she was ordered for labs including CBC with diff, IgE, RAST allergy panel and Alpha 1 phenotype. She was also ordered for HRCT. Recommended to continue Trelegy. Pre-op eval for Dr. Gretta Cool DDS at follow-up, may need cardiology eval/clearance if imaging shows any significant cardiac findings. CT Imaging showed significant worsening of chronic extensive patchy ground glass opacities throughout both lungs with associated mild patchy subpleural reticulation and mild cylindrical bronchiectasis. Favoring desquamative interstitial pneumonia. Three vessel coronary atherosclerosis.   She feels her breathing has improve some. She still has a np congested cough, this is not  new. She is unable to produce mucus. She is still smoking 10 cigarettes a day. She is interested in quitting and has in the past several years ago. Her primary care physican just put her on chantix. She has no PFTs on file. She is needing clearance for dental work, unclear type of anesthesia. She had no issues with recent colonscopy several weeks ago.    Labs:  Eos absolute 700; IgE 2. Rast allergy panel negative.  Alpha1 level was not collected   Allergies  Allergen Reactions  . Pseudoeph-Doxylamine-Dm-Apap     REACTION: keeps awake, legs constantly moving  [place]    Immunization History  Administered Date(s) Administered  . Influenza,inj,Quad PF,6+ Mos 12/10/2020  . Pneumococcal-Unspecified 02/10/2021  . Td 05/30/2002  . Tdap 11/12/2011, 02/16/2021  . Zoster Recombinat (Shingrix) 02/16/2021    Past Medical History:  Diagnosis Date  . Allergy   . Anemia    during pregnancies  . Anxiety   . Arthritis    hands and hip  . Cancer ([place])   . CAP (community acquired pneumonia) 01/07/2020  . Chronic kidney disease    KIDNEYSTONES  . Cigarette smoker 11/05/2018  . COPD (chronic obstructive pulmonary disease) ([place])   . Depression   . Heart murmur    MITRAL VALVE PROLASP  . High cholesterol   . History of endometriosis   . Incontinence   . Low back pain radiating to both legs 07/25/2020  . Menopausal symptoms   . Menopause 01/09/2012  . Migraine   . Myocardial infarction ([place])    silent 3 years ago  . Night sweats 10/05/2019  . Osteoporosis   . Paresthesia of foot, bilateral 03/28/2019  . Productive cough 12/25/2019  . Renal stone 06/01/2020  . Skin cancer   . Thyroid disease   . Tick bite 02/10/2019    Tobacco History: Social History   Tobacco Use  Smoking Status Current Every Day Smoker  . Packs/day: 1.00  . Years: 19.00  . Pack years: 19.00  . Types: Cigarettes  Smokeless Tobacco Never Used  Tobacco Comment   10 cigerettes daily.    Ready to quit: Not  Answered Counseling given: Not Answered Comment: 10 cigerettes daily.    Outpatient Medications Prior to Visit  Medication Sig Dispense Refill  . albuterol (PROVENTIL) (2.5 MG/3ML) 0.083% nebulizer solution Take 3 mLs (2.5 mg total) by nebulization every 6 (six) hours as needed for wheezing or shortness of breath. 75 mL 0  . aspirin 81 MG chewable tablet Chew 81 mg by mouth daily.    . busPIRone (BUSPAR) 10 MG tablet TAKE 1 TABLET BY MOUTH 2 TIMES DAILY AS NEEDED. 180 tablet 1  . Fluticasone-Umeclidin-Vilant (TRELEGY ELLIPTA) 200-62.5-25 MCG/INH AEPB Inhale 1  puff into the lungs daily. 3 each 3  . gabapentin (NEURONTIN) 300 MG capsule Take 1 capsule (300 mg total) by mouth 2 (two) times daily. 90 capsule 3  . HYDROcodone-acetaminophen (NORCO/VICODIN) 5-325 MG tablet Take 1-2 tablets by mouth every 6 (six) hours as needed for severe pain. 20 tablet 0  . naproxen (NAPROSYN) 500 MG tablet TAKE 1 TABLET BY MOUTH 2 TIMES DAILY WITH A MEAL. 30 tablet 0  . omeprazole (PRILOSEC) 20 MG capsule TAKE 1 CAPSULE BY MOUTH EVERY DAY 30 capsule 0  . ondansetron (ZOFRAN ODT) 4 MG disintegrating tablet Take 1 tablet (4 mg total) by mouth every 8 (eight) hours as needed for nausea or vomiting. 30 tablet 0  . rosuvastatin (CRESTOR) 10 MG tablet TAKE 1 TABLET (10 MG TOTAL) BY MOUTH AT BEDTIME. INCREASE TO 20MG  AFTER 1 WEEK IF TOLERATING WELL. 90 tablet 1  . sertraline (ZOLOFT) 100 MG tablet TAKE 1 TABLET BY MOUTH EVERY DAY 90 tablet 2  . varenicline (CHANTIX) 0.5 MG tablet 0.5mg  once daily day 1-3, 0.5mg  TWICE daily day 4-7, then 1mg  twice daily day 8 and after. 30 tablet 1   No facility-administered medications prior to visit.    Review of Systems  Review of Systems  Constitutional: Negative.   HENT: Positive for congestion.   Respiratory: Positive for cough. Negative for chest tightness and wheezing.   Cardiovascular: Negative.    Physical Exam  BP 102/70 (BP Location: Left Arm, Patient Position:  Sitting, Cuff Size: Normal)   Pulse (!) 55   Temp 97.6 F (36.4 C) (Temporal)   Ht 5' (1.524 m)   Wt 101 lb 12.8 oz (46.2 kg)   SpO2 96%   BMI 19.88 kg/m  Physical Exam Constitutional:      General: She is not in acute distress.    Appearance: Normal appearance. She is not ill-appearing.  HENT:     Mouth/Throat:     Comments: Deferred d/t masking Cardiovascular:     Rate and Rhythm: Normal rate and regular rhythm.  Pulmonary:     Effort: Pulmonary effort is normal. No respiratory distress.     Breath sounds: Rhonchi present.  Musculoskeletal:        General: Normal range of motion.  Skin:    General: Skin is warm and dry.  Neurological:     General: No focal deficit present.     Mental Status: She is alert and oriented to person, place, and time. Mental status is at baseline.  Psychiatric:        Mood and Affect: Mood normal.        Behavior: Behavior normal.        Thought Content: Thought content normal.        Judgment: Judgment normal.      Lab Results:  CBC    Component Value Date/Time   WBC 7.5 02/25/2021 1013   RBC 4.33 02/25/2021 1013   HGB 13.0 02/25/2021 1013   HGB 14.4 12/10/2020 1114   HCT 39.1 02/25/2021 1013   HCT 43.1 12/10/2020 1114   PLT 238.0 02/25/2021 1013   PLT 293 12/10/2020 1114   MCV 90.3 02/25/2021 1013   MCV 91 12/10/2020 1114   MCH 30.5 01/29/2021 2230   MCHC 33.2 02/25/2021 1013   RDW 16.6 (H) 02/25/2021 1013   RDW 13.9 12/10/2020 1114   LYMPHSABS 1.8 02/25/2021 1013   LYMPHSABS 2.2 12/10/2020 1114   MONOABS 0.4 02/25/2021 1013   EOSABS 0.7 02/25/2021 1013  EOSABS 0.2 12/10/2020 1114   BASOSABS 0.1 02/25/2021 1013   BASOSABS 0.1 12/10/2020 1114    BMET    Component Value Date/Time   NA 134 (L) 01/29/2021 2230   NA 141 12/10/2020 1114   K 3.0 (L) 01/29/2021 2230   CL 101 01/29/2021 2230   CO2 23 01/29/2021 2230   GLUCOSE 145 (H) 01/29/2021 2230   BUN 10 01/29/2021 2230   BUN 7 12/10/2020 1114   CREATININE 0.71  01/29/2021 2230   CREATININE 0.66 10/11/2011 1137   CALCIUM 8.7 (L) 01/29/2021 2230   GFRNONAA >60 01/29/2021 2230   GFRAA 106 12/10/2020 1114    BNP No results found for: BNP  ProBNP No results found for: PROBNP  Imaging: CT Chest High Resolution  Result Date: 03/06/2021 CLINICAL DATA:  Follow-up ground-glass opacities. Chronic dyspnea for 1 year. Current smoker. COPD. EXAM: CT CHEST WITHOUT CONTRAST TECHNIQUE: Multidetector CT imaging of the chest was performed following the standard protocol without intravenous contrast. High resolution imaging of the lungs, as well as inspiratory and expiratory imaging, was performed. COMPARISON:  01/29/2021 chest radiograph.  10/14/2019 chest CT. FINDINGS: Cardiovascular: Normal heart size. No significant pericardial effusion/thickening. Three-vessel coronary atherosclerosis. Atherosclerotic nonaneurysmal thoracic aorta. Normal caliber pulmonary arteries. Mediastinum/Nodes: No discrete thyroid nodules. Unremarkable esophagus. No pathologically enlarged axillary, mediastinal or hilar lymph nodes, noting limited sensitivity for the detection of hilar adenopathy on this noncontrast study. Lungs/Pleura: No pneumothorax. No pleural effusion. Extensive patchy ground-glass opacities throughout both lungs involving all lung lobes, most prominent in the lower lobes, significantly worsened in the interval. Scattered regions of patchy subpleural reticulation, generalized mild cylindrical bronchiectasis and mild architectural distortion with scattered mucoid impaction, similar. No frank honeycombing. A few scattered tiny solid pulmonary nodules in both lungs, largest 3 mm in the apical left upper lobe (series 9/image 24), stable, considered benign. No new significant pulmonary nodules. No significant lobular air trapping or evidence of tracheobronchomalacia on the expiration sequence. No lung masses. Diffuse bronchial wall thickening and mild centrilobular emphysema. Upper  abdomen: No acute abnormality. Musculoskeletal: No aggressive appearing focal osseous lesions. Bilateral breast prostheses. IMPRESSION: 1. Significant worsening of chronic extensive patchy ground-glass opacities throughout both lungs, lower lobe predominant, with associated mild patchy subpleural reticulation and mild cylindrical bronchiectasis. 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 2. Three-vessel coronary atherosclerosis. 3. Diffuse bronchial wall thickening and mild centrilobular emphysema, compatible with the provided history of COPD. 4. Aortic Atherosclerosis (ICD10-I70.0) and Emphysema (ICD10-J43.9). Electronically Signed   By: Ilona Sorrel M.D.   On: 03/06/2021 09:15     Assessment & Plan:   COPD (chronic obstructive pulmonary disease) ([place]) - Current smoker. No PFTs on file.  Breathing is some better. She has a chronic np cough. - Continue Trelegy 200 one puff daily and albuterol nebulizer q 6 hours prn sob/wheezing - Adding Mucinex 600mg  BID and flutter valve TID  - Could consider adding Montelukast at next visit d/t elevated eosinophils - Needs Alpha 1 level drawn today - Will discuss clearance with Dr. Chase Caller for dental procedure  - FU in 3 months with pulmonary function testing    Current smoker - Patient is currently smoking 10 cigarettes a day - STRONGLY encourage smoking cessation, dicussed tapering amount and picking quit date - She has been prescribed Chantix per PCP   Abnormal chest CT - HRCT 03/04/21 showed significant worsening of smoking related interstitial lung disease changes - Reviewed findings with patient and again stressed importance of  complete smoking cessation   Atherosclerosis - Patient has three vessel coronary atherosclerosis on CT imaging - Currently on Crestor 10mg  daily  - Referred to cardiology    1) RISK FOR PROLONGED MECHANICAL VENTILAION - > 48h  1A) Arozullah - Prolonged mech ventilation  risk Arozullah Postperative Pulmonary Risk Score - for mech ventilation dependence >48h [employer], [facility] 2000, major non-cardiac surgery) Comment Score  Type of surgery - abd ao aneurysm (27), thoracic (21), neurosurgery / upper abdominal / vascular (21), neck (11) Dental surgery  0  Emergency Surgery - (11)  0  ALbumin < 3 or poor nutritional state - (9)  0  BUN > 30 -  (8)  0  Partial or completely dependent functional status - (7)  0  COPD -  (6)  6  Age - 71 to 84 (4), > 70  (6)  0  TOTAL  6  Risk Stratifcation scores  - < 10 (0.5%), 11-19 (1.8%), 20-27 (4.2%), 28-40 (10.1%), >40 (26.6%)  <10 (0.5%) risk mech ventilation dependence      2) RISK FOR POST OP PNEUMONIA Score source Risk  Lyndel Safe - Post Op Pnemounia risk  [email or web address] 0.7 % Risk of postoperative pneumonia    R3) ISK FOR ANY POST-OP PULMONARY COMPLICATION Score source Risk  CANET/ARISCAT Score - risk for ANY/ALl pulmonary complications - > risk of in-hospital post-op pulmonary complications (composite including respiratory failure, respiratory infection, pleural effusion, atelectasis, pneumothorax, bronchospasm, aspiration pneumonitis) [email or web address] - based on age, anemia, pulse ox, resp infection prior 30d, incision site, duration of surgery, and emergency v elective surgery Low risk 1.6% risk of in-hospital post-op pulmonary complications (composite including respiratory failure, respiratory infection, pleural effusion, atelectasis, pneumothorax, bronchospasm, aspiration pneumonitis)       Martyn Ehrich, NP 03/08/2021

## 2021-03-07 NOTE — Telephone Encounter (Signed)
I'm seeing patient tomorrow to follow-up on testing that you ordered and I think surgical clearance. I will likely refer to cardiology if not already seeing d/t CAD.   Imaging showed significant worsening of chronic extensive patchy ground glass opacities throughout both lungs with associated mild patchy subpleural reticulation and mild cylindrical bronchiectasis. Favoring desquamative interstitial pneumonia. Three vessel coronary atherosclerosis.   Do you have any recommendations d/t image findings?

## 2021-03-07 NOTE — Telephone Encounter (Signed)
Called and spoke with pt letting her know the results of labwork and stated to her that MR had not had a chance to review CT results yet. Pt verbalized understanding. Pt wanted to know if MR had been able to do preop eval so she can have dental work done and I looked at last OV and stated to her that he said the preop eval would be done at f/u.  Stated to pt that we did need to schedule an appt and asked if she would be okay having preop clearance visit with APP or if she wanted appt to be with MR only and she said if she could get visit sooner with APP before appt could be with MR she would see an APP. appt scheduled for pt with Derl Barrow, NP tomorrow 5/10 at 9:30. Nothing further needed.

## 2021-03-08 ENCOUNTER — Ambulatory Visit: Payer: Medicaid Other | Admitting: Primary Care

## 2021-03-08 ENCOUNTER — Encounter: Payer: Self-pay | Admitting: Primary Care

## 2021-03-08 ENCOUNTER — Other Ambulatory Visit: Payer: Self-pay

## 2021-03-08 VITALS — BP 102/70 | HR 55 | Temp 97.6°F | Ht 60.0 in | Wt 101.8 lb

## 2021-03-08 DIAGNOSIS — F17210 Nicotine dependence, cigarettes, uncomplicated: Secondary | ICD-10-CM

## 2021-03-08 DIAGNOSIS — F17200 Nicotine dependence, unspecified, uncomplicated: Secondary | ICD-10-CM

## 2021-03-08 DIAGNOSIS — I7090 Unspecified atherosclerosis: Secondary | ICD-10-CM

## 2021-03-08 DIAGNOSIS — J449 Chronic obstructive pulmonary disease, unspecified: Secondary | ICD-10-CM | POA: Diagnosis not present

## 2021-03-08 DIAGNOSIS — R9389 Abnormal findings on diagnostic imaging of other specified body structures: Secondary | ICD-10-CM | POA: Diagnosis not present

## 2021-03-08 NOTE — Assessment & Plan Note (Addendum)
-   Patient has three vessel coronary atherosclerosis on CT imaging - Currently on Crestor 10mg  daily  - Referred to cardiology

## 2021-03-08 NOTE — Assessment & Plan Note (Signed)
-   HRCT 03/04/21 showed significant worsening of smoking related interstitial lung disease changes - Reviewed findings with patient and again stressed importance of complete smoking cessation

## 2021-03-08 NOTE — Assessment & Plan Note (Addendum)
-   Current smoker. No PFTs on file.  Breathing is some better. She has a chronic np cough. - Continue Trelegy 200 one puff daily and albuterol nebulizer q 6 hours prn sob/wheezing - Adding Mucinex 600mg  BID and flutter valve TID  - Could consider adding Montelukast at next visit d/t elevated eosinophils - Needs Alpha 1 level drawn today - Will discuss clearance with Dr. Chase Caller for dental procedure  - FU in 3 months with pulmonary function testing

## 2021-03-08 NOTE — Patient Instructions (Addendum)
COPD Recommendations: - Continue Trelegy 200- take one puff daily in the morning (rinse mouth after use) - Use albuterol nebulizer every 6 hours as needed for breakthrough shortness of breath/wheezing - Take Mucinex 600-1200mg  twice a day for congestion/loosen phlegm - Use flutter valve 2-3 times a day EVERYDAY  - STRONGLY encourage you quti smoking (taper amount, pick quit date and circle on calender)  Post Surgical Recommendations:  - Aggressive pulmonary toilet with O2, bronchodilatation, incentive spirometry and early ambulation=  Referred: - Cardiology re: CAD/atherosclerosis   Labs: - Alpha 1 level today  Orders: - PFTs in 3 months   Follow-up - 3 months with Dr. Chase Caller with pulmonary function testing

## 2021-03-08 NOTE — Assessment & Plan Note (Signed)
-   Patient is currently smoking 10 cigarettes a day - STRONGLY encourage smoking cessation, dicussed tapering amount and picking quit date - She has been prescribed Chantix per PCP

## 2021-03-10 ENCOUNTER — Other Ambulatory Visit: Payer: Self-pay | Admitting: Family Medicine

## 2021-03-10 DIAGNOSIS — Z1231 Encounter for screening mammogram for malignant neoplasm of breast: Secondary | ICD-10-CM

## 2021-03-11 ENCOUNTER — Other Ambulatory Visit: Payer: Self-pay

## 2021-03-11 ENCOUNTER — Ambulatory Visit: Payer: Medicaid Other | Admitting: Cardiovascular Disease

## 2021-03-11 ENCOUNTER — Encounter: Payer: Self-pay | Admitting: Cardiovascular Disease

## 2021-03-11 VITALS — BP 90/60 | HR 71 | Ht 60.0 in | Wt 98.2 lb

## 2021-03-11 DIAGNOSIS — I341 Nonrheumatic mitral (valve) prolapse: Secondary | ICD-10-CM | POA: Diagnosis not present

## 2021-03-11 DIAGNOSIS — J449 Chronic obstructive pulmonary disease, unspecified: Secondary | ICD-10-CM

## 2021-03-11 MED ORDER — NITROGLYCERIN 0.4 MG SL SUBL: 0.4000 mg | SUBLINGUAL_TABLET | SUBLINGUAL | 1 refills | Status: DC | PRN

## 2021-03-11 MED ORDER — COLCHICINE 0.6 MG PO TABS: 0.6000 mg | ORAL_TABLET | Freq: Two times a day (BID) | ORAL | 0 refills | Status: DC

## 2021-03-11 NOTE — Patient Instructions (Addendum)
Medication Instructions:  1) START COLCHICINE 0.6 mg twice daily for 3 weeks 2) You are prescribed NITROGLYCERIN for chest pain. Make sure to follow instructions to take this medication. A single nitroglycerin sublingual tablet should be placed under your tongue and allowed to dissolve. Do not chew or swallow the tablet. You may take an additional tablet every 5 minutes, but no more than 3 tablets in 15 minutes. If your chest pain does not resolve after taking 3 tablets, seek immediate medical attention. *If you need a refill on your cardiac medications before your next appointment, please call your pharmacy*  Follow-Up: You are scheduled for a follow-up visit with Dr. Acie Fredrickson on 04/11/2021 at 10:00AM.

## 2021-03-11 NOTE — Progress Notes (Signed)
Cardiology Office Note:    Date:  03/11/2021   ID:  [name], DOB 01-27-1966, MRN [id number]  PCP:  Patriciaann Clan, DO   [facility] Providers Cardiologist:  Verne Carrow to update primary MD,subspecialty MD or APP then REFRESH:1}    Referring MD: Martyn Ehrich, NP   Chief Complaint  Patient presents with  . Coronary Artery Disease    Mar 11, 2021:    [name] is a 56 y.o. female with a hx of cigarette smoking, COPD, anxiety. Hx of MVP and HLD She was recently found to have coronary atherosclerosis by CT scan. Still smoking , started at age 56.  Has occasional CP / discomfort Several years ago ( 8 years ago )  Had chest pressure, Radiated up to her jaw . No stress test or cath ( she did not have insurance )  Had   Still having left shoulder pain , radiates down into her chest .   These last a few minutes.  Not associated with eating or drinking , not related to twisting her torso   Does not eat well Becomes nauseated and develops abdominal pain with eating .  ? bowel ischemia  Colonoscopy last month ( [place])  She did not discuss her post prandial pain   She does not do any cardio exercise .   Takes care of her 4 grandkids.   + family hx of CAD  - mother and father , uncle     Had covid in March 2022.  Past Medical History:  Diagnosis Date  . Allergy   . Anemia    during pregnancies  . Anxiety   . Arthritis    hands and hip  . Cancer ([place])   . CAP (community acquired pneumonia) 01/07/2020  . Chronic kidney disease    KIDNEYSTONES  . Cigarette smoker 11/05/2018  . COPD (chronic obstructive pulmonary disease) ([place])   . Depression   . Heart murmur    MITRAL VALVE PROLASP  . High cholesterol   . History of endometriosis   . Incontinence   . Low back pain radiating to both legs 07/25/2020  . Menopausal symptoms   . Menopause 01/09/2012  . Migraine   . Myocardial infarction ([place])    silent 3 years ago  . Night sweats 10/05/2019  .  Osteoporosis   . Paresthesia of foot, bilateral 03/28/2019  . Productive cough 12/25/2019  . Renal stone 06/01/2020  . Skin cancer   . Thyroid disease   . Tick bite 02/10/2019    Past Surgical History:  Procedure Laterality Date  . ANKLE SURGERY     RIGHT  . APPENDECTOMY    . BREAST ENHANCEMENT SURGERY     SALINE  . BUNIONECTOMY     BILATERAL FEET  . TOTAL ABDOMINAL HYSTERECTOMY W/ BILATERAL SALPINGOOPHORECTOMY    . TVT  2007  . WISDOM TOOTH EXTRACTION     X 4    Current Medications: Current Meds  Medication Sig  . albuterol (PROVENTIL) (2.5 MG/3ML) 0.083% nebulizer solution Take 3 mLs (2.5 mg total) by nebulization every 6 (six) hours as needed for wheezing or shortness of breath.  Marland Kitchen aspirin 81 MG chewable tablet Chew 81 mg by mouth daily.  . busPIRone (BUSPAR) 10 MG tablet TAKE 1 TABLET BY MOUTH 2 TIMES DAILY AS NEEDED.  Marland Kitchen colchicine 0.6 MG tablet Take 1 tablet (0.6 mg total) by mouth 2 (two) times daily for 21 days.  . Fluticasone-Umeclidin-Vilant (TRELEGY  ELLIPTA) 200-62.5-25 MCG/INH AEPB Inhale 1 puff into the lungs daily.  Marland Kitchen gabapentin (NEURONTIN) 300 MG capsule Take 1 capsule (300 mg total) by mouth 2 (two) times daily.  Marland Kitchen HYDROcodone-acetaminophen (NORCO/VICODIN) 5-325 MG tablet Take 1-2 tablets by mouth every 6 (six) hours as needed for severe pain.  . naproxen (NAPROSYN) 500 MG tablet TAKE 1 TABLET BY MOUTH 2 TIMES DAILY WITH A MEAL.  . nitroGLYCERIN (NITROSTAT) 0.4 MG SL tablet Place 1 tablet (0.4 mg total) under the tongue every 5 (five) minutes as needed for chest pain.  Marland Kitchen omeprazole (PRILOSEC) 20 MG capsule TAKE 1 CAPSULE BY MOUTH EVERY DAY  . ondansetron (ZOFRAN ODT) 4 MG disintegrating tablet Take 1 tablet (4 mg total) by mouth every 8 (eight) hours as needed for nausea or vomiting.  . rosuvastatin (CRESTOR) 10 MG tablet TAKE 1 TABLET (10 MG TOTAL) BY MOUTH AT BEDTIME. INCREASE TO 20MG  AFTER 1 WEEK IF TOLERATING WELL.  . sertraline (ZOLOFT) 100 MG tablet TAKE 1  TABLET BY MOUTH EVERY DAY  . varenicline (CHANTIX) 0.5 MG tablet 0.5mg  once daily day 1-3, 0.5mg  TWICE daily day 4-7, then 1mg  twice daily day 8 and after.     Allergies:   Acetaminophen, Dextromethorphan, Doxylamine, Pseudoeph-doxylamine-dm-apap, and Pseudoephedrine hcl   Social History   Socioeconomic History  . Marital status: Divorced    Spouse name: Not on file  . Number of children: Not on file  . Years of education: Not on file  . Highest education level: Not on file  Occupational History  . Not on file  Tobacco Use  . Smoking status: Current Every Day Smoker    Packs/day: 1.00    Years: 19.00    Pack years: 19.00    Types: Cigarettes  . Smokeless tobacco: Never Used  . Tobacco comment: 10 cigerettes daily.   Vaping Use  . Vaping Use: Never used  Substance and Sexual Activity  . Alcohol use: Yes    Comment: OCCASIONALLY  . Drug use: No  . Sexual activity: Yes    Partners: Male    Birth control/protection: Surgical  Other Topics Concern  . 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  Trying to get disability.   Lives with mom.    Highest level of education:  High school   Social Determinants of Health   Financial Resource Strain: High Risk  . Difficulty of Paying Living Expenses: Very hard  Food Insecurity: No Food Insecurity  . Worried About Charity fundraiser in the Last Year: Never true  . Ran Out of Food in the Last Year: Never true  Transportation Needs: Not on file  Physical Activity: Not on file  Stress: Not on file  Social Connections: Not on file     Family History: The patient's family history includes Alcohol abuse in her father; Colon cancer in her maternal uncle; Colon polyps in her mother; Diabetes in her maternal grandfather, maternal grandmother, and mother; Heart disease in her brother, mother, and sister; Hyperlipidemia in her brother, mother, and sister; Stomach cancer in her maternal uncle;  Tuberculosis in her father.  ROS:   Please see the history of present illness.     All other systems reviewed and are negative.  EKGs/Labs/Other Studies Reviewed:    The following studies were reviewed today:      Recent Labs: 12/10/2020: ALT 9 01/29/2021: BUN 10; Creatinine, Ser 0.71; Potassium 3.0; Sodium 134 02/25/2021: Hemoglobin  13.0; Platelets 238.0  Recent Lipid Panel    Component Value Date/Time   CHOL 308 (H) 12/10/2020 1114   TRIG 103 12/10/2020 1114   HDL 44 12/10/2020 1114   CHOLHDL 7.0 (H) 12/10/2020 1114   CHOLHDL 5.9 10/11/2011 1137   VLDL 27 10/11/2011 1137   LDLCALC 246 (H) 12/10/2020 1114     Risk Assessment/Calculations:       Physical Exam:    VS:  BP 90/60   Pulse 71   Ht 5' (1.524 m)   Wt 98 lb 3.2 oz (44.5 kg)   BMI 19.18 kg/m     Wt Readings from Last 3 Encounters:  03/11/21 98 lb 3.2 oz (44.5 kg)  03/08/21 101 lb 12.8 oz (46.2 kg)  02/22/21 96 lb (43.5 kg)     GEN: thin,  Chronically ill appearing female  HEENT: Normal NECK: No JVD; No carotid bruits LYMPHATICS: No lymphadenopathy CARDIAC: RR, last systolic murmur / click,   Also has a pericardial friction but in left axillary line  RESPIRATORY:  Extensive wheezing , congestion  ABDOMEN: Soft, non-tender, non-distended MUSCULOSKELETAL:  No edema; No deformity  SKIN: Warm and dry NEUROLOGIC:  Alert and oriented x 3 PSYCHIATRIC:  Normal affect   EKG: Mar 11, 2021: Normal sinus rhythm.  She has T wave versions in the inferior leads.  ASSESSMENT:    No diagnosis found. PLAN:       1. Chest pain :  ECG shows inferior TWI  Add NTG prn Cont ASA 81 mg a day  She needs an ischemic work-up but she has such profound wheezing and bronchospasm I do not think that she would tolerate a Lexiscan Myoview study.  At present we have a severe shortage of IV contrast and were not doing elective cardiac procedures or CT scans.  I would like to see her back in the office in 4 weeks.  At that time  I am hoping that we see some relief of the IV contrast.  We will likely need to set her up for heart catheterization at that time.  2.  Pericarditis: likely related to her recent covid infectino  Will give her colchicine 0.6 mg PO BID for the next 2 to 3 weeks.  We will reassess her again in 4 weeks.    3. COPD - absolutely needs to quit smoking   4.   Possible bowel ischemia : needs to stop smoking   5, ongoing cigarette smoking     Medication Adjustments/Labs and Tests Ordered: Current medicines are reviewed at length with the patient today.  Concerns regarding medicines are outlined above.  No orders of the defined types were placed in this encounter.  Meds ordered this encounter  Medications  . colchicine 0.6 MG tablet    Sig: Take 1 tablet (0.6 mg total) by mouth 2 (two) times daily for 21 days.    Dispense:  42 tablet    Refill:  0  . nitroGLYCERIN (NITROSTAT) 0.4 MG SL tablet    Sig: Place 1 tablet (0.4 mg total) under the tongue every 5 (five) minutes as needed for chest pain.    Dispense:  25 tablet    Refill:  1    Patient Instructions  Medication Instructions:  1) START COLCHICINE 0.6 mg twice daily for 3 weeks 2) You are prescribed NITROGLYCERIN for chest pain. Make sure to follow instructions to take this medication. A single nitroglycerin sublingual tablet should be placed under your tongue and allowed to dissolve.  Do not chew or swallow the tablet. You may take an additional tablet every 5 minutes, but no more than 3 tablets in 15 minutes. If your chest pain does not resolve after taking 3 tablets, seek immediate medical attention. *If you need a refill on your cardiac medications before your next appointment, please call your pharmacy*  Follow-Up: You are scheduled for a follow-up visit with Dr. Acie Fredrickson on 04/11/2021 at 10:00AM.    Signed, Mertie Moores, MD  03/11/2021 5:37 PM    [place]

## 2021-03-14 ENCOUNTER — Encounter: Payer: Self-pay | Admitting: Family Medicine

## 2021-03-14 ENCOUNTER — Other Ambulatory Visit: Payer: Self-pay

## 2021-03-14 ENCOUNTER — Other Ambulatory Visit: Payer: Self-pay | Admitting: Family Medicine

## 2021-03-14 DIAGNOSIS — S92901S Unspecified fracture of right foot, sequela: Secondary | ICD-10-CM

## 2021-03-14 MED ORDER — COLCHICINE 0.6 MG PO CAPS: 0.6000 mg | ORAL_CAPSULE | Freq: Two times a day (BID) | ORAL | 0 refills | Status: DC

## 2021-03-15 NOTE — Addendum Note (Signed)
Addended by: Jacinta Shoe on: 03/15/2021 08:22 AM   Modules accepted: Orders

## 2021-03-16 ENCOUNTER — Telehealth: Payer: Self-pay | Admitting: *Deleted

## 2021-03-16 NOTE — Telephone Encounter (Signed)
   [facility] Pre-operative Risk Assessment    Patient Name: [name]  DOB: 16-Jun-1966  MRN: [id number]   HEARTCARE STAFF: - Please ensure there is not already an duplicate clearance open for this procedure. - Under Visit Info/Reason for Call, type in Other and utilize the format Clearance MM/DD/YY or Clearance TBD. Do not use dashes or single digits. - If request is for dental extraction, please clarify the # of teeth to be extracted.  Request for surgical clearance: DR. Hoyt Koch IS ALSO REQUESTING RECENT LABS AS WELL; WILL HAND CLEARANCE FORM OVER TO OUR HIM DEPT TO FAX LAB RESULTS TO DR. Hoyt Koch  1. What type of surgery is being performed? REMOVAL OF ALL REMAINING 16 TEETH WITH ALVEOLOPLASTY   2. When is this surgery scheduled? TBD   3. What type of clearance is required (medical clearance vs. Pharmacy clearance to hold med vs. Both)? MEDICAL  4. Are there any medications that need to be held prior to surgery and how long? ASA    5. Practice name and name of physician performing surgery? [facility], D.M.D[facility] P.A[facility]   6. What is the office phone number? [phone]   7.   What is the office fax number? [phone]  8.   Anesthesia type (None, local, MAC, general) ? GENERAL   Julaine Hua 03/16/2021, 5:34 PM  _________________________________________________________________   (provider comments below)

## 2021-03-17 ENCOUNTER — Other Ambulatory Visit: Payer: Self-pay | Admitting: Family Medicine

## 2021-03-17 ENCOUNTER — Ambulatory Visit: Payer: Medicaid Other | Admitting: Family Medicine

## 2021-03-17 ENCOUNTER — Other Ambulatory Visit: Payer: Self-pay | Admitting: Nurse Practitioner

## 2021-03-17 ENCOUNTER — Other Ambulatory Visit: Payer: Self-pay

## 2021-03-17 VITALS — BP 98/60 | HR 69 | Ht 60.0 in | Wt 97.4 lb

## 2021-03-17 DIAGNOSIS — D229 Melanocytic nevi, unspecified: Secondary | ICD-10-CM | POA: Diagnosis not present

## 2021-03-17 DIAGNOSIS — K0889 Other specified disorders of teeth and supporting structures: Secondary | ICD-10-CM

## 2021-03-17 DIAGNOSIS — L989 Disorder of the skin and subcutaneous tissue, unspecified: Secondary | ICD-10-CM | POA: Diagnosis not present

## 2021-03-17 MED ORDER — HYDROCODONE-ACETAMINOPHEN 5-325 MG PO TABS: 1.0000 | ORAL_TABLET | Freq: Four times a day (QID) | ORAL | 0 refills | Status: DC | PRN

## 2021-03-17 NOTE — Progress Notes (Signed)
    SUBJECTIVE:   CHIEF COMPLAINT / HPI:   Mole: Patient is a 55 year old female who presents today to dermatology clinic for concern of a nevus.  She states she has had a history of skin cancers and her daughter noticed a spot on her back about a month ago.  She states that she feels confident that it is changing in size.  She denies other complaints at this time.Marland Kitchen  PERTINENT  PMH / PSH: History of skin cancers  OBJECTIVE:   BP 98/60   Pulse 69   Ht 5' (1.524 m)   Wt 97 lb 6.4 oz (44.2 kg)   SpO2 97%   BMI 19.02 kg/m    General: NAD, pleasant, able to participate in exam Derm: Flat 1/2 cm lesion noted on the left upper aspect of her back with some central clearing.  The borders are mildly irregular with some darker and some areas lighter.  Procedure note: Punch biopsy: Informed consent was performed, signed and placed in the chart.  The area was cleaned with Betadine x2 followed by alcohol. 2cc of lidocaine with epinephrine were injected for anesthesia.  A 2 mm punch biopsy was used on the outer perimeter of the lesion.  The sample was collected in a specimen jar.  Bleeding was minimal.  The area was covered with a Band-Aid.  Patient tolerated procedure well.  ASSESSMENT/PLAN:   Atypical nevi Assessment: 55 year old female with a history of skin cancers in the past with a nevi present on her back which was noticed about a month ago when she believes it is changing.  This nevi does have some irregularity to it with some central clearing, it is nonraised, and has some mild irregularity to the border.  Overall differential can include a benign nevi versus melanoma versus other etiology.  It does not appear as an actinic keratosis or squamous cell/basal cell on initial inspection. Plan: -Punch biopsy performed, see procedure note above -We will follow-up with patient in about 1-2 weeks once the results return to determine the next steps based off the etiology of the above lesion.     Lurline Del, [place] [facility] [place]    This note was prepared using Dragon voice recognition software and may include unintentional dictation errors due to the inherent limitations of voice recognition software.

## 2021-03-17 NOTE — Progress Notes (Signed)
Sent in refill of Norco # 20 tablets as needed for severe tooth pain.  PMP aware reviewed prior to and appropriate.  We fortunately have already had several conversations that this is meant to be on a subacute basis until she is able to get her dental repair, which has been postponed until complete pulmonary and cardiology evaluation.  Patriciaann Clan, DO

## 2021-03-17 NOTE — Telephone Encounter (Signed)
Primary Cardiologist:Philip Nahser, MD  Chart reviewed as part of pre-operative protocol coverage. Because of [name]'s past medical history and time since last visit, he/she will require a follow-up visit in order to better assess preoperative cardiovascular risk.  Pre-op covering staff: - Please schedule appointment and call patient to inform them. - Please contact requesting surgeon's office via preferred method (i.e, phone, fax) to inform them of need for appointment prior to surgery.  If applicable, this message will also be routed to pharmacy pool and/or primary cardiologist for input on holding anticoagulant/antiplatelet agent as requested below so that this information is available at time of patient's appointment.   Last office note from Dr. Acie Fredrickson states patient needs ischemic evaluation and probable cardiac catheterization.  I will defer cardiac clearance to Dr. Acie Fredrickson with follow-up cardiology visit.  Deberah Pelton, NP  03/17/2021, 12:08 PM

## 2021-03-17 NOTE — Assessment & Plan Note (Signed)
Assessment: 55 year old female with a history of skin cancers in the past with a nevi present on her back which was noticed about a month ago when she believes it is changing.  This nevi does have some irregularity to it with some central clearing, it is nonraised, and has some mild irregularity to the border.  Overall differential can include a benign nevi versus melanoma versus other etiology.  It does not appear as an actinic keratosis or squamous cell/basal cell on initial inspection. Plan: -Punch biopsy performed, see procedure note above -We will follow-up with patient in about 1-2 weeks once the results return to determine the next steps based off the etiology of the above lesion.

## 2021-03-17 NOTE — Patient Instructions (Signed)
Today we performed a punch biopsy.  We are sending this to pathology and will let you know the results when they return.  If you not hear from [place] in the next 2 weeks please let us know.  We will discuss next steps once we get the results of this test back.

## 2021-03-17 NOTE — Telephone Encounter (Signed)
Nevin Bloodgood,   Ok to refill?

## 2021-03-22 NOTE — Telephone Encounter (Signed)
[name], it is okay to refill but this patient has never been seen in clinic. Looks like she had a direct colonoscopy only. I took a call on the weekend and gave her Zofran. She needs an appt with someone in clinic to discuss ongoing nausea.Thanks

## 2021-03-25 ENCOUNTER — Other Ambulatory Visit: Payer: Self-pay | Admitting: Family Medicine

## 2021-03-25 DIAGNOSIS — S92901S Unspecified fracture of right foot, sequela: Secondary | ICD-10-CM

## 2021-03-25 DIAGNOSIS — K0889 Other specified disorders of teeth and supporting structures: Secondary | ICD-10-CM

## 2021-03-28 ENCOUNTER — Other Ambulatory Visit: Payer: Self-pay | Admitting: Cardiovascular Disease

## 2021-03-28 ENCOUNTER — Other Ambulatory Visit: Payer: Self-pay | Admitting: Family Medicine

## 2021-03-28 DIAGNOSIS — S92901S Unspecified fracture of right foot, sequela: Secondary | ICD-10-CM

## 2021-03-30 DIAGNOSIS — R32 Unspecified urinary incontinence: Secondary | ICD-10-CM | POA: Diagnosis not present

## 2021-04-05 ENCOUNTER — Encounter: Payer: Self-pay | Admitting: Family Medicine

## 2021-04-06 LAB — ALPHA-1 ANTITRYPSIN PHENOTYPE: A-1 Antitrypsin, Ser: 134 mg/dL (ref 83–199)

## 2021-04-06 NOTE — Progress Notes (Signed)
She has alpha 1 phenotype deficiency, with normal quantitative. No intervention at this time. Will periodically monitor

## 2021-04-07 ENCOUNTER — Other Ambulatory Visit: Payer: Self-pay | Admitting: Family Medicine

## 2021-04-07 DIAGNOSIS — F419 Anxiety disorder, unspecified: Secondary | ICD-10-CM

## 2021-04-08 ENCOUNTER — Other Ambulatory Visit: Payer: Self-pay | Admitting: Family Medicine

## 2021-04-08 DIAGNOSIS — K0889 Other specified disorders of teeth and supporting structures: Secondary | ICD-10-CM

## 2021-04-08 MED ORDER — HYDROCODONE-ACETAMINOPHEN 5-325 MG PO TABS: 1.0000 | ORAL_TABLET | Freq: Four times a day (QID) | ORAL | 0 refills | Status: DC | PRN

## 2021-04-10 ENCOUNTER — Encounter: Payer: Self-pay | Admitting: Cardiovascular Disease

## 2021-04-10 NOTE — H&P (View-Only) (Signed)
Cardiology Office Note:    Date:  04/11/2021   ID:  [name], DOB [birth year], MRN [id number]  PCP:  Patriciaann Clan, DO   [facility] Providers Cardiologist:  Verne Carrow to update primary MD,subspecialty MD or APP then REFRESH:1}    Referring MD: Patriciaann Clan, DO   Chief Complaint  Patient presents with   Shortness of Breath    Mar 11, 2021:    [name] is a 55 y.o. female with a hx of cigarette smoking, COPD, anxiety. Hx of MVP and HLD She was recently found to have coronary atherosclerosis by CT scan. Still smoking , started at age 89.  Has occasional CP / discomfort Several years ago ( 8 years ago )  Had chest pressure, Radiated up to her jaw . No stress test or cath ( she did not have insurance )  Had   Still having left shoulder pain , radiates down into her chest .   These last a few minutes.  Not associated with eating or drinking , not related to twisting her torso   Does not eat well Becomes nauseated and develops abdominal pain with eating .  ? bowel ischemia  Colonoscopy last month ( [facility])  She did not discuss her post prandial pain   She does not do any cardio exercise .   Takes care of her 4 grandkids.   + family hx of CAD  - mother and father , uncle   Had covid in March 2022.  April 11, 2021 [name] is seen today for follow up of her angina symptoms Significant smoking hx. Extensive wheezing when I met her in May, 2022. + fam hx of CAD No ischemic eval when I first met her because of her wheezing ( not a candidate for Lexiscan) and worldwide IV contrasst shortate - not possible to do a Coronary CTA or cath She is here for follow up .   Has episodes of chest pain ,  intrascapular pain , profuse diaphoresis Is wearing a nicotine patch  Is on ASA 81` Has not tried NTG yet.   We treated her with colchicine for presumed pericarditis  Pain is better.    Will schedule her for cath     Past Medical History:  Diagnosis  Date   Allergy    Anemia    during pregnancies   Anxiety    Arthritis    hands and hip   Cancer ([place])    CAP (community acquired pneumonia) 01/07/2020   Chronic kidney disease    KIDNEYSTONES   Cigarette smoker 11/05/2018   COPD (chronic obstructive pulmonary disease) ([place])    Depression    Heart murmur    MITRAL VALVE PROLASP   High cholesterol    History of endometriosis    Incontinence    Low back pain radiating to both legs 07/25/2020   Menopausal symptoms    Menopause 01/09/2012   Migraine    Myocardial infarction ([place])    silent 3 years ago   Night sweats 10/05/2019   Osteoporosis    Paresthesia of foot, bilateral 03/28/2019   Productive cough 12/25/2019   Renal stone 06/01/2020   Skin cancer    Thyroid disease    Tick bite 02/10/2019    Past Surgical History:  Procedure Laterality Date   ANKLE SURGERY     RIGHT   APPENDECTOMY     BREAST ENHANCEMENT SURGERY     SALINE   BUNIONECTOMY  BILATERAL FEET   TOTAL ABDOMINAL HYSTERECTOMY W/ BILATERAL SALPINGOOPHORECTOMY     TVT  2007   WISDOM TOOTH EXTRACTION     X 4    Current Medications: Current Meds  Medication Sig   albuterol (PROVENTIL) (2.5 MG/3ML) 0.083% nebulizer solution Take 3 mLs (2.5 mg total) by nebulization every 6 (six) hours as needed for wheezing or shortness of breath.   aspirin 81 MG chewable tablet Chew 81 mg by mouth daily.   busPIRone (BUSPAR) 10 MG tablet Take 1 tablet (10 mg total) by mouth 2 (two) times daily as needed.   Fluticasone-Umeclidin-Vilant (TRELEGY ELLIPTA) 200-62.5-25 MCG/INH AEPB Inhale 1 puff into the lungs daily.   gabapentin (NEURONTIN) 300 MG capsule Take 1 capsule (300 mg total) by mouth 2 (two) times daily.   HYDROcodone-acetaminophen (NORCO/VICODIN) 5-325 MG tablet Take 1-2 tablets by mouth every 6 (six) hours as needed for severe pain.   MITIGARE 0.6 MG CAPS TAKE 0.6 MG BY MOUTH IN THE MORNING AND AT BEDTIME.   naproxen (NAPROSYN) 500 MG tablet TAKE 1 TABLET BY MOUTH 2  TIMES DAILY WITH A MEAL.   nitroGLYCERIN (NITROSTAT) 0.4 MG SL tablet Place 1 tablet (0.4 mg total) under the tongue every 5 (five) minutes as needed for chest pain.   omeprazole (PRILOSEC) 20 MG capsule TAKE 1 CAPSULE BY MOUTH EVERY DAY   ondansetron (ZOFRAN-ODT) 4 MG disintegrating tablet TAKE 1 TABLET BY MOUTH EVERY 8 HOURS AS NEEDED FOR NAUSEA AND VOMITING   rosuvastatin (CRESTOR) 10 MG tablet Take 20 mg by mouth daily.   sertraline (ZOLOFT) 100 MG tablet TAKE 1 TABLET BY MOUTH EVERY DAY   varenicline (CHANTIX) 0.5 MG tablet 0.69m once daily day 1-3, 0.569mTWICE daily day 4-7, then 58m55mwice daily day 8 and after.     Allergies:   Acetaminophen, Dextromethorphan, Doxylamine, Pseudoeph-doxylamine-dm-apap, and Pseudoephedrine hcl   Social History   Socioeconomic History   Marital status: Divorced    Spouse name: Not on file   Number of children: Not on file   Years of education: Not on file   Highest education level: Not on file  Occupational History   Not on file  Tobacco Use   Smoking status: Every Day    Packs/day: 1.00    Years: 19.00    Pack years: 19.00    Types: Cigarettes   Smokeless tobacco: Never   Tobacco comments:    10 cigerettes daily.   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   Lives with mom.    Highest level of education:  High school   Social Determinants of Health   Financial Resource Strain: High Risk   Difficulty of Paying Living Expenses: Very hard  Food Insecurity: No Food Insecurity   Worried About Run[employer]ity fundraiser the Last Year: Never true   Ran Out of Food in the Last Year: Never true  Transportation Needs: Not on file  Physical  Activity: Not on file  Stress: Not on file  Social Connections: Not on file     Family History: The patient's family history includes Alcohol abuse in  her father; Colon cancer in her maternal uncle; Colon polyps in her mother; Diabetes in her maternal grandfather, maternal grandmother, and mother; Heart disease in her brother, mother, and sister; Hyperlipidemia in her brother, mother, and sister; Stomach cancer in her maternal uncle; Tuberculosis in her father.  ROS:   Please see the history of present illness.     All other systems reviewed and are negative.  EKGs/Labs/Other Studies Reviewed:    The following studies were reviewed today:      Recent Labs: 12/10/2020: ALT 9 01/29/2021: BUN 10; Creatinine, Ser 0.71; Potassium 3.0; Sodium 134 02/25/2021: Hemoglobin 13.0; Platelets 238.0  Recent Lipid Panel    Component Value Date/Time   CHOL 308 (H) 12/10/2020 1114   TRIG 103 12/10/2020 1114   HDL 44 12/10/2020 1114   CHOLHDL 7.0 (H) 12/10/2020 1114   CHOLHDL 5.9 10/11/2011 1137   VLDL 27 10/11/2011 1137   LDLCALC 246 (H) 12/10/2020 1114     Risk Assessment/Calculations:       Physical Exam:    Physical Exam: Blood pressure 98/68, pulse 63, height 5' (1.524 m), weight 97 lb 6.4 oz (44.2 kg), SpO2 97 %.  GEN:  young female,  HEENT: Normal,  multiple decayed teeth  NECK: No JVD; No carotid bruits LYMPHATICS: No lymphadenopathy CARDIAC: RRR , mid systolic click,  soft murmur radiating to left axilla  RESPIRATORY: Coarse inspiratory wheezes bilaterally, right greater than left. ABDOMEN: Soft, non-tender, non-distended MUSCULOSKELETAL:  No edema; No deformity  SKIN: Warm and dry NEUROLOGIC:  Alert and oriented x 3   EKG: Normal sinus rhythm at 63.  Nonspecific T wave inversion in the inferior leads as well as lead V3.  Right axis deviation  ASSESSMENT:    1. Mitral valve prolapse    PLAN:       Chest pain : She has T wave inversions in leads III, aVF, V3.  She  has symptoms that are consistent with unstable angina.  We will refer her for heart catheterization.  We discussed the risk, benefits, options of heart catheterization.  She understands and agrees to proceed.   2.  Pericarditis: l she is improved significantly on colchicine.  We will continue the colchicine for now.  Anticipating stopping it soon we will wait and see what the heart catheterization and echocardiogram shows.    3. COPD -she stopped smoking 5 days ago.  Continue nicotine patch.      Medication Adjustments/Labs and Tests Ordered: Current medicines are reviewed at length with the patient today.  Concerns regarding medicines are outlined above.  Orders Placed This Encounter  Procedures   CBC   Basic metabolic panel   EKG 70-VXBL   ECHOCARDIOGRAM COMPLETE    No orders of the defined types were placed in this encounter.    Patient Instructions  Medication Instructions:   Your physician recommends that you continue on your current medications as directed. Please refer to the Current Medication list given to you today.  *If you need a refill on your cardiac medications before your next appointment, please call your pharmacy*   Lab Work:   CBC, BMET  If you have labs (blood work) drawn today and your tests are completely normal, you will receive your results only by: [place] (if you have MyChart) OR A paper copy in the mail If you have any lab test that is abnormal or we need to change your treatment, we will call you to review the results.   Testing/Procedures: Your physician  has requested that you have an echocardiogram. Echocardiography is a painless test that uses sound waves to create images of your heart. It provides your doctor with information about the size and shape of your heart and how well your heart's chambers and valves are working. This procedure takes approximately one hour. There are no restrictions for this procedure.  Your physician  has requested that you have a cardiac catheterization. Cardiac catheterization is used to diagnose and/or treat various heart conditions. Doctors may recommend this procedure for a number of different reasons. The most common reason is to evaluate chest pain. Chest pain can be a symptom of coronary artery disease (CAD), and cardiac catheterization can show whether plaque is narrowing or blocking your heart's arteries. This procedure is also used to evaluate the valves, as well as measure the blood flow and oxygen levels in different parts of your heart. For further information please visit [email or web address]. Please follow instruction sheet, as given.      Follow-Up: At [facility], you and your health needs are our priority.  As part of our continuing mission to provide you with exceptional heart care, we have created designated Provider Care Teams.  These Care Teams include your primary Cardiologist (physician) and Advanced Practice Providers (APPs -  Physician Assistants and Nurse Practitioners) who all work together to provide you with the care you need, when you need it.  We recommend signing up for the patient portal called "MyChart".  Sign up information is provided on this After Visit Summary.  MyChart is used to connect with patients for Virtual Visits (Telemedicine).  Patients are able to view lab/test results, encounter notes, upcoming appointments, etc.  Non-urgent messages can be sent to your provider as well.   To learn more about what you can do with MyChart, go to [email or web address].    Your next appointment:   1-2 month(s)  The format for your next appointment:   In Person  Provider:   Mertie Moores, MD   Other Instructions   [place] OFFICE [address], [place] [address] Dept: [phone] Loc: [place]  04/11/2021  You are scheduled for a  Cardiac Catheterization on Wednesday, June 15 with Dr. Larae Grooms.  1. Please arrive at the [facility] (Main Entrance A) at [facility]: [address] [place], [place] [address] at 6:30 AM (This time is two hours before your procedure to ensure your preparation). Free valet parking service is available.   Special note: Every effort is made to have your procedure done on time. Please understand that emergencies sometimes delay scheduled procedures.  2. Diet: Do not eat solid foods after midnight.  The patient may have clear liquids until 5am upon the day of the procedure.  3. Labs: You will need to have blood drawn Today  4. Medication instructions in preparation for your procedure:   Contrast Allergy: No  On the morning of your procedure, take your Aspirin and any morning medicines NOT listed above.  You may use sips of water.  5. Plan for one night stay--bring personal belongings. 6. Bring a current list of your medications and current insurance cards. 7. You MUST have a responsible person to drive you home. 8. Someone MUST be with you the first 24 hours after you arrive home or your discharge will be delayed. 9. Please wear clothes that are easy to get on and off and wear  slip-on shoes.  Thank you for allowing [place] to care for you!   -- [facility] Cardi[facility] services    Signed, Mertie Moores, MD  04/11/2021 10:56 AM    [place]

## 2021-04-10 NOTE — Progress Notes (Signed)
Cardiology Office Note:    Date:  04/11/2021   ID:  [name], DOB [birth year], MRN [id number]  PCP:  Patriciaann Clan, DO   [facility] Providers Cardiologist:  Verne Carrow to update primary MD,subspecialty MD or APP then REFRESH:1}    Referring MD: Patriciaann Clan, DO   Chief Complaint  Patient presents with   Shortness of Breath    Mar 11, 2021:    [name] is a 55 y.o. female with a hx of cigarette smoking, COPD, anxiety. Hx of MVP and HLD She was recently found to have coronary atherosclerosis by CT scan. Still smoking , started at age 89.  Has occasional CP / discomfort Several years ago ( 8 years ago )  Had chest pressure, Radiated up to her jaw . No stress test or cath ( she did not have insurance )  Had   Still having left shoulder pain , radiates down into her chest .   These last a few minutes.  Not associated with eating or drinking , not related to twisting her torso   Does not eat well Becomes nauseated and develops abdominal pain with eating .  ? bowel ischemia  Colonoscopy last month ( [facility])  She did not discuss her post prandial pain   She does not do any cardio exercise .   Takes care of her 4 grandkids.   + family hx of CAD  - mother and father , uncle   Had covid in March 2022.  April 11, 2021 [name] is seen today for follow up of her angina symptoms Significant smoking hx. Extensive wheezing when I met her in May, 2022. + fam hx of CAD No ischemic eval when I first met her because of her wheezing ( not a candidate for Lexiscan) and worldwide IV contrasst shortate - not possible to do a Coronary CTA or cath She is here for follow up .   Has episodes of chest pain ,  intrascapular pain , profuse diaphoresis Is wearing a nicotine patch  Is on ASA 81` Has not tried NTG yet.   We treated her with colchicine for presumed pericarditis  Pain is better.    Will schedule her for cath     Past Medical History:  Diagnosis  Date   Allergy    Anemia    during pregnancies   Anxiety    Arthritis    hands and hip   Cancer ([place])    CAP (community acquired pneumonia) 01/07/2020   Chronic kidney disease    KIDNEYSTONES   Cigarette smoker 11/05/2018   COPD (chronic obstructive pulmonary disease) ([place])    Depression    Heart murmur    MITRAL VALVE PROLASP   High cholesterol    History of endometriosis    Incontinence    Low back pain radiating to both legs 07/25/2020   Menopausal symptoms    Menopause 01/09/2012   Migraine    Myocardial infarction ([place])    silent 3 years ago   Night sweats 10/05/2019   Osteoporosis    Paresthesia of foot, bilateral 03/28/2019   Productive cough 12/25/2019   Renal stone 06/01/2020   Skin cancer    Thyroid disease    Tick bite 02/10/2019    Past Surgical History:  Procedure Laterality Date   ANKLE SURGERY     RIGHT   APPENDECTOMY     BREAST ENHANCEMENT SURGERY     SALINE   BUNIONECTOMY  BILATERAL FEET   TOTAL ABDOMINAL HYSTERECTOMY W/ BILATERAL SALPINGOOPHORECTOMY     TVT  2007   WISDOM TOOTH EXTRACTION     X 4    Current Medications: Current Meds  Medication Sig   albuterol (PROVENTIL) (2.5 MG/3ML) 0.083% nebulizer solution Take 3 mLs (2.5 mg total) by nebulization every 6 (six) hours as needed for wheezing or shortness of breath.   aspirin 81 MG chewable tablet Chew 81 mg by mouth daily.   busPIRone (BUSPAR) 10 MG tablet Take 1 tablet (10 mg total) by mouth 2 (two) times daily as needed.   Fluticasone-Umeclidin-Vilant (TRELEGY ELLIPTA) 200-62.5-25 MCG/INH AEPB Inhale 1 puff into the lungs daily.   gabapentin (NEURONTIN) 300 MG capsule Take 1 capsule (300 mg total) by mouth 2 (two) times daily.   HYDROcodone-acetaminophen (NORCO/VICODIN) 5-325 MG tablet Take 1-2 tablets by mouth every 6 (six) hours as needed for severe pain.   MITIGARE 0.6 MG CAPS TAKE 0.6 MG BY MOUTH IN THE MORNING AND AT BEDTIME.   naproxen (NAPROSYN) 500 MG tablet TAKE 1 TABLET BY MOUTH 2  TIMES DAILY WITH A MEAL.   nitroGLYCERIN (NITROSTAT) 0.4 MG SL tablet Place 1 tablet (0.4 mg total) under the tongue every 5 (five) minutes as needed for chest pain.   omeprazole (PRILOSEC) 20 MG capsule TAKE 1 CAPSULE BY MOUTH EVERY DAY   ondansetron (ZOFRAN-ODT) 4 MG disintegrating tablet TAKE 1 TABLET BY MOUTH EVERY 8 HOURS AS NEEDED FOR NAUSEA AND VOMITING   rosuvastatin (CRESTOR) 10 MG tablet Take 20 mg by mouth daily.   sertraline (ZOLOFT) 100 MG tablet TAKE 1 TABLET BY MOUTH EVERY DAY   varenicline (CHANTIX) 0.5 MG tablet 0.69m once daily day 1-3, 0.569mTWICE daily day 4-7, then 58m55mwice daily day 8 and after.     Allergies:   Acetaminophen, Dextromethorphan, Doxylamine, Pseudoeph-doxylamine-dm-apap, and Pseudoephedrine hcl   Social History   Socioeconomic History   Marital status: Divorced    Spouse name: Not on file   Number of children: Not on file   Years of education: Not on file   Highest education level: Not on file  Occupational History   Not on file  Tobacco Use   Smoking status: Every Day    Packs/day: 1.00    Years: 19.00    Pack years: 19.00    Types: Cigarettes   Smokeless tobacco: Never   Tobacco comments:    10 cigerettes daily.   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   Lives with mom.    Highest level of education:  High school   Social Determinants of Health   Financial Resource Strain: High Risk   Difficulty of Paying Living Expenses: Very hard  Food Insecurity: No Food Insecurity   Worried About Run[employer]ity fundraiser the Last Year: Never true   Ran Out of Food in the Last Year: Never true  Transportation Needs: Not on file  Physical  Activity: Not on file  Stress: Not on file  Social Connections: Not on file     Family History: The patient's family history includes Alcohol abuse in  her father; Colon cancer in her maternal uncle; Colon polyps in her mother; Diabetes in her maternal grandfather, maternal grandmother, and mother; Heart disease in her brother, mother, and sister; Hyperlipidemia in her brother, mother, and sister; Stomach cancer in her maternal uncle; Tuberculosis in her father.  ROS:   Please see the history of present illness.     All other systems reviewed and are negative.  EKGs/Labs/Other Studies Reviewed:    The following studies were reviewed today:      Recent Labs: 12/10/2020: ALT 9 01/29/2021: BUN 10; Creatinine, Ser 0.71; Potassium 3.0; Sodium 134 02/25/2021: Hemoglobin 13.0; Platelets 238.0  Recent Lipid Panel    Component Value Date/Time   CHOL 308 (H) 12/10/2020 1114   TRIG 103 12/10/2020 1114   HDL 44 12/10/2020 1114   CHOLHDL 7.0 (H) 12/10/2020 1114   CHOLHDL 5.9 10/11/2011 1137   VLDL 27 10/11/2011 1137   LDLCALC 246 (H) 12/10/2020 1114     Risk Assessment/Calculations:       Physical Exam:    Physical Exam: Blood pressure 98/68, pulse 63, height 5' (1.524 m), weight 97 lb 6.4 oz (44.2 kg), SpO2 97 %.  GEN:  young female,  HEENT: Normal,  multiple decayed teeth  NECK: No JVD; No carotid bruits LYMPHATICS: No lymphadenopathy CARDIAC: RRR , mid systolic click,  soft murmur radiating to left axilla  RESPIRATORY: Coarse inspiratory wheezes bilaterally, right greater than left. ABDOMEN: Soft, non-tender, non-distended MUSCULOSKELETAL:  No edema; No deformity  SKIN: Warm and dry NEUROLOGIC:  Alert and oriented x 3   EKG: Normal sinus rhythm at 63.  Nonspecific T wave inversion in the inferior leads as well as lead V3.  Right axis deviation  ASSESSMENT:    1. Mitral valve prolapse    PLAN:       Chest pain : She has T wave inversions in leads III, aVF, V3.  She  has symptoms that are consistent with unstable angina.  We will refer her for heart catheterization.  We discussed the risk, benefits, options of heart catheterization.  She understands and agrees to proceed.   2.  Pericarditis: l she is improved significantly on colchicine.  We will continue the colchicine for now.  Anticipating stopping it soon we will wait and see what the heart catheterization and echocardiogram shows.    3. COPD -she stopped smoking 5 days ago.  Continue nicotine patch.      Medication Adjustments/Labs and Tests Ordered: Current medicines are reviewed at length with the patient today.  Concerns regarding medicines are outlined above.  Orders Placed This Encounter  Procedures   CBC   Basic metabolic panel   EKG 81-XBJY   ECHOCARDIOGRAM COMPLETE    No orders of the defined types were placed in this encounter.    Patient Instructions  Medication Instructions:   Your physician recommends that you continue on your current medications as directed. Please refer to the Current Medication list given to you today.  *If you need a refill on your cardiac medications before your next appointment, please call your pharmacy*   Lab Work:   CBC, BMET  If you have labs (blood work) drawn today and your tests are completely normal, you will receive your results only by: [place] (if you have MyChart) OR A paper copy in the mail If you have any lab test that is abnormal or we need to change your treatment, we will call you to review the results.   Testing/Procedures: Your physician  has requested that you have an echocardiogram. Echocardiography is a painless test that uses sound waves to create images of your heart. It provides your doctor with information about the size and shape of your heart and how well your heart's chambers and valves are working. This procedure takes approximately one hour. There are no restrictions for this procedure.  Your physician  has requested that you have a cardiac catheterization. Cardiac catheterization is used to diagnose and/or treat various heart conditions. Doctors may recommend this procedure for a number of different reasons. The most common reason is to evaluate chest pain. Chest pain can be a symptom of coronary artery disease (CAD), and cardiac catheterization can show whether plaque is narrowing or blocking your heart's arteries. This procedure is also used to evaluate the valves, as well as measure the blood flow and oxygen levels in different parts of your heart. For further information please visit HugeFiesta.[email or web address]. Please follow instruction sheet, as given.      Follow-Up: At [facility], you and your health needs are our priority.  As part of our continuing mission to provide you with exceptional heart care, we have created designated Provider Care Teams.  These Care Teams include your primary Cardiologist (physician) and Advanced Practice Providers (APPs -  Physician Assistants and Nurse Practitioners) who all work together to provide you with the care you need, when you need it.  We recommend signing up for the patient portal called "MyChart".  Sign up information is provided on this After Visit Summary.  MyChart is used to connect with patients for Virtual Visits (Telemedicine).  Patients are able to view lab/test results, encounter notes, upcoming appointments, etc.  Non-urgent messages can be sent to your provider as well.   To learn more about what you can do with MyChart, go to [email or web address].    Your next appointment:   1-2 month(s)  The format for your next appointment:   In Person  Provider:   Mertie Moores, MD   Other Instructions   [place] OFFICE [place], [place] [address] Dept: [phone] Loc: [place]  04/11/2021  You are scheduled for a  Cardiac Catheterization on Wednesday, June 15 with Dr. Larae Grooms.  1. Please arrive at the [facility] (Main Entrance A) at [facility]: [address] [place], [place] [address] at 6:30 AM (This time is two hours before your procedure to ensure your preparation). Free valet parking service is available.   Special note: Every effort is made to have your procedure done on time. Please understand that emergencies sometimes delay scheduled procedures.  2. Diet: Do not eat solid foods after midnight.  The patient may have clear liquids until 5am upon the day of the procedure.  3. Labs: You will need to have blood drawn Today  4. Medication instructions in preparation for your procedure:   Contrast Allergy: No  On the morning of your procedure, take your Aspirin and any morning medicines NOT listed above.  You may use sips of water.  5. Plan for one night stay--bring personal belongings. 6. Bring a current list of your medications and current insurance cards. 7. You MUST have a responsible person to drive you home. 8. Someone MUST be with you the first 24 hours after you arrive home or your discharge will be delayed. 9. Please wear clothes that are easy to get on and off and wear  slip-on shoes.  Thank you for allowing [place] to care for you!   -- [facility] Invasive Cardiovascular services    Signed, Mertie Moores, MD  04/11/2021 10:56 AM    [place]

## 2021-04-11 ENCOUNTER — Encounter: Payer: Self-pay | Admitting: Cardiovascular Disease

## 2021-04-11 ENCOUNTER — Ambulatory Visit: Payer: Medicaid Other | Admitting: Cardiovascular Disease

## 2021-04-11 ENCOUNTER — Other Ambulatory Visit: Payer: Self-pay

## 2021-04-11 VITALS — BP 98/68 | HR 63 | Ht 60.0 in | Wt 97.4 lb

## 2021-04-11 DIAGNOSIS — I341 Nonrheumatic mitral (valve) prolapse: Secondary | ICD-10-CM

## 2021-04-11 LAB — CBC
Hematocrit: 38.8 % (ref 34.0–46.6)
Hemoglobin: 12.9 g/dL (ref 11.1–15.9)
MCH: 29.7 pg (ref 26.6–33.0)
MCHC: 33.2 g/dL (ref 31.5–35.7)
MCV: 89 fL (ref 79–97)
Platelets: 320 10*3/uL (ref 150–450)
RBC: 4.34 x10E6/uL (ref 3.77–5.28)
RDW: 16.7 % — ABNORMAL HIGH (ref 11.7–15.4)
WBC: 9.6 10*3/uL (ref 3.4–10.8)

## 2021-04-11 LAB — BASIC METABOLIC PANEL
BUN/Creatinine Ratio: 13 (ref 9–23)
BUN: 8 mg/dL (ref 6–24)
CO2: 29 mmol/L (ref 20–29)
Calcium: 8.8 mg/dL (ref 8.7–10.2)
Chloride: 99 mmol/L (ref 96–106)
Creatinine, Ser: 0.64 mg/dL (ref 0.57–1.00)
Glucose: 92 mg/dL (ref 65–99)
Potassium: 4.3 mmol/L (ref 3.5–5.2)
Sodium: 135 mmol/L (ref 134–144)
eGFR: 105 mL/min/{1.73_m2} (ref >59)

## 2021-04-11 NOTE — Patient Instructions (Addendum)
Medication Instructions:   Your physician recommends that you continue on your current medications as directed. Please refer to the Current Medication list given to you today.  *If you need a refill on your cardiac medications before your next appointment, please call your pharmacy*   Lab Work:   CBC, BMET  If you have labs (blood work) drawn today and your tests are completely normal, you will receive your results only by: [place] (if you have MyChart) OR A paper copy in the mail If you have any lab test that is abnormal or we need to change your treatment, we will call you to review the results.   Testing/Procedures: Your physician has requested that you have an echocardiogram. Echocardiography is a painless test that uses sound waves to create images of your heart. It provides your doctor with information about the size and shape of your heart and how well your heart's chambers and valves are working. This procedure takes approximately one hour. There are no restrictions for this procedure.  Your physician has requested that you have a cardiac catheterization. Cardiac catheterization is used to diagnose and/or treat various heart conditions. Doctors may recommend this procedure for a number of different reasons. The most common reason is to evaluate chest pain. Chest pain can be a symptom of coronary artery disease (CAD), and cardiac catheterization can show whether plaque is narrowing or blocking your heart's arteries. This procedure is also used to evaluate the valves, as well as measure the blood flow and oxygen levels in different parts of your heart. For further information please visit HugeFiesta.[email or web address]. Please follow instruction sheet, as given.      Follow-Up: At [facility], you and your health needs are our priority.  As part of our continuing mission to provide you with exceptional heart care, we have created designated Provider Care Teams.  These Care Teams  include your primary Cardiologist (physician) and Advanced Practice Providers (APPs -  Physician Assistants and Nurse Practitioners) who all work together to provide you with the care you need, when you need it.  We recommend signing up for the patient portal called "MyChart".  Sign up information is provided on this After Visit Summary.  MyChart is used to connect with patients for Virtual Visits (Telemedicine).  Patients are able to view lab/test results, encounter notes, upcoming appointments, etc.  Non-urgent messages can be sent to your provider as well.   To learn more about what you can do with MyChart, go to [email or web address].    Your next appointment:   1-2 month(s)  The format for your next appointment:   In Person  Provider:   Mertie Moores, MD   Other Instructions   [place] OFFICE [place], [place] [address] Dept: [phone] Loc: [place]  04/11/2021  You are scheduled for a Cardiac Catheterization on Wednesday, June 15 with Dr. Larae Grooms.  1. Please arrive at the [facility] (Main Entrance A) at [facility]: [address] [place], [place] [address] at 6:30 AM (This time is two hours before your procedure to ensure your preparation). Free valet parking service is available.   Special note: Every effort is made to have your procedure done on time. Please understand that emergencies sometimes delay scheduled procedures.  2. Diet: Do not eat solid foods after midnight.  The patient may have clear liquids until 5am upon the day of the  procedure.  3. Labs: You will need to have blood drawn Today  4. Medication instructions in preparation for your procedure:   Contrast Allergy: No  On the morning of your procedure, take your Aspirin and any morning medicines NOT listed above.  You may use sips of water.  5. Plan for one night  stay--bring personal belongings. 6. Bring a current list of your medications and current insurance cards. 7. You MUST have a responsible person to drive you home. 8. Someone MUST be with you the first 24 hours after you arrive home or your discharge will be delayed. 9. Please wear clothes that are easy to get on and off and wear slip-on shoes.  Thank you for allowing [place] to care for you!   -- [place] Invasive Cardiovascular services

## 2021-04-12 ENCOUNTER — Telehealth: Payer: Self-pay | Admitting: *Deleted

## 2021-04-12 NOTE — Telephone Encounter (Signed)
Pt contacted pre-catheterization scheduled at [facility] for: Wednesday April 13, 2021 8:30 AM Verified arrival time and place: [facility]) at: 6:30 AM   No solid food after midnight prior to cath, clear liquids until 5 AM day of procedure.   AM meds can be  taken pre-cath with sips of water including: ASA 81 mg   Confirmed patient has responsible adult to drive home post procedure and be with patient first 24 hours after arriving home: yes  You are allowed ONE visitor in the waiting room during the time you are at the hospital for your procedure. Both you and your visitor must wear a mask once you enter the hospital.   Patient reports does not currently have any symptoms concerning for COVID-19 and no household members with COVID-19 like illness.       Reviewed procedure/mask/visitor instructions with patient.

## 2021-04-13 ENCOUNTER — Other Ambulatory Visit: Payer: Self-pay

## 2021-04-13 ENCOUNTER — Encounter (HOSPITAL_COMMUNITY)
Admission: RE | Disposition: A | Discharge status: Home / Self Care | Payer: Self-pay | Source: Home / Self Care | Attending: Interventional Cardiology

## 2021-04-13 ENCOUNTER — Ambulatory Visit (HOSPITAL_COMMUNITY)
Admission: RE | Admit: 2021-04-13 | Discharge: 2021-04-13 | Disposition: A | Discharge status: Home / Self Care | Payer: Medicaid Other | Attending: Interventional Cardiology | Admitting: Interventional Cardiology

## 2021-04-13 DIAGNOSIS — F17210 Nicotine dependence, cigarettes, uncomplicated: Secondary | ICD-10-CM | POA: Diagnosis not present

## 2021-04-13 DIAGNOSIS — J449 Chronic obstructive pulmonary disease, unspecified: Secondary | ICD-10-CM | POA: Insufficient documentation

## 2021-04-13 DIAGNOSIS — E785 Hyperlipidemia, unspecified: Secondary | ICD-10-CM | POA: Diagnosis not present

## 2021-04-13 DIAGNOSIS — Z79899 Other long term (current) drug therapy: Secondary | ICD-10-CM | POA: Diagnosis not present

## 2021-04-13 DIAGNOSIS — I341 Nonrheumatic mitral (valve) prolapse: Secondary | ICD-10-CM | POA: Diagnosis not present

## 2021-04-13 DIAGNOSIS — I200 Unstable angina: Secondary | ICD-10-CM | POA: Clinically undetermined

## 2021-04-13 DIAGNOSIS — Z791 Long term (current) use of non-steroidal anti-inflammatories (NSAID): Secondary | ICD-10-CM | POA: Insufficient documentation

## 2021-04-13 DIAGNOSIS — Z888 Allergy status to other drugs, medicaments and biological substances status: Secondary | ICD-10-CM | POA: Diagnosis not present

## 2021-04-13 DIAGNOSIS — F419 Anxiety disorder, unspecified: Secondary | ICD-10-CM | POA: Diagnosis not present

## 2021-04-13 DIAGNOSIS — Z886 Allergy status to analgesic agent status: Secondary | ICD-10-CM | POA: Diagnosis not present

## 2021-04-13 DIAGNOSIS — I25110 Atherosclerotic heart disease of native coronary artery with unstable angina pectoris: Secondary | ICD-10-CM | POA: Insufficient documentation

## 2021-04-13 DIAGNOSIS — R9389 Abnormal findings on diagnostic imaging of other specified body structures: Secondary | ICD-10-CM | POA: Diagnosis present

## 2021-04-13 DIAGNOSIS — Z7982 Long term (current) use of aspirin: Secondary | ICD-10-CM | POA: Insufficient documentation

## 2021-04-13 DIAGNOSIS — Z8249 Family history of ischemic heart disease and other diseases of the circulatory system: Secondary | ICD-10-CM | POA: Diagnosis not present

## 2021-04-13 HISTORY — PX: LEFT HEART CATH AND CORONARY ANGIOGRAPHY: CATH118249

## 2021-04-13 SURGERY — LEFT HEART CATH AND CORONARY ANGIOGRAPHY
Anesthesia: LOCAL

## 2021-04-13 MED ORDER — HYDROCODONE-ACETAMINOPHEN 5-325 MG PO TABS
ORAL_TABLET | ORAL | Status: AC
  Filled 2021-04-13: qty 2

## 2021-04-13 MED ORDER — MIDAZOLAM HCL 2 MG/2ML IJ SOLN
INTRAMUSCULAR | Status: DC | PRN
  Administered 2021-04-13 (×2): 1 mg via INTRAVENOUS

## 2021-04-13 MED ORDER — SODIUM CHLORIDE 0.9% FLUSH: 3.0000 mL | Freq: Two times a day (BID) | INTRAVENOUS | Status: DC

## 2021-04-13 MED ORDER — SODIUM CHLORIDE 0.9 % IV SOLN: 250.0000 mL | INTRAVENOUS | Status: DC | PRN

## 2021-04-13 MED ORDER — MORPHINE SULFATE (PF) 2 MG/ML IV SOLN
2.0000 mg | INTRAVENOUS | Status: DC | PRN
  Administered 2021-04-13: 2 mg via INTRAVENOUS
  Filled 2021-04-13: qty 1

## 2021-04-13 MED ORDER — IOHEXOL 350 MG/ML SOLN
INTRAVENOUS | Status: DC | PRN
  Administered 2021-04-13: 25 mL

## 2021-04-13 MED ORDER — LIDOCAINE HCL (PF) 1 % IJ SOLN
INTRAMUSCULAR | Status: DC | PRN
  Administered 2021-04-13: 2 mL

## 2021-04-13 MED ORDER — VERAPAMIL HCL 2.5 MG/ML IV SOLN
INTRAVENOUS | Status: DC | PRN
  Administered 2021-04-13: 10 mL via INTRA_ARTERIAL

## 2021-04-13 MED ORDER — FENTANYL CITRATE (PF) 100 MCG/2ML IJ SOLN
INTRAMUSCULAR | Status: DC | PRN
  Administered 2021-04-13: 25 ug via INTRAVENOUS

## 2021-04-13 MED ORDER — SODIUM CHLORIDE 0.9% FLUSH: 3.0000 mL | INTRAVENOUS | Status: DC | PRN

## 2021-04-13 MED ORDER — SODIUM CHLORIDE 0.9 % IV SOLN: INTRAVENOUS | Status: AC

## 2021-04-13 MED ORDER — HYDROCODONE-ACETAMINOPHEN 5-325 MG PO TABS
1.0000 | ORAL_TABLET | Freq: Four times a day (QID) | ORAL | Status: DC | PRN
  Administered 2021-04-13: 2 via ORAL

## 2021-04-13 MED ORDER — FENTANYL CITRATE (PF) 100 MCG/2ML IJ SOLN
INTRAMUSCULAR | Status: AC
  Filled 2021-04-13: qty 2

## 2021-04-13 MED ORDER — MIDAZOLAM HCL 2 MG/2ML IJ SOLN
INTRAMUSCULAR | Status: AC
  Filled 2021-04-13: qty 2

## 2021-04-13 MED ORDER — ASPIRIN 81 MG PO CHEW: 81.0000 mg | CHEWABLE_TABLET | ORAL | Status: DC

## 2021-04-13 MED ORDER — SODIUM CHLORIDE 0.9 % WEIGHT BASED INFUSION
3.0000 mL/kg/h | INTRAVENOUS | Status: AC
  Administered 2021-04-13: 3 mL/kg/h via INTRAVENOUS

## 2021-04-13 MED ORDER — HEPARIN SODIUM (PORCINE) 1000 UNIT/ML IJ SOLN
INTRAMUSCULAR | Status: DC | PRN
  Administered 2021-04-13: 3000 [IU] via INTRAVENOUS

## 2021-04-13 MED ORDER — ONDANSETRON HCL 4 MG/2ML IJ SOLN: 4.0000 mg | Freq: Four times a day (QID) | INTRAMUSCULAR | Status: DC | PRN

## 2021-04-13 MED ORDER — VERAPAMIL HCL 2.5 MG/ML IV SOLN
INTRAVENOUS | Status: AC
  Filled 2021-04-13: qty 2

## 2021-04-13 MED ORDER — SODIUM CHLORIDE 0.9 % WEIGHT BASED INFUSION: 1.0000 mL/kg/h | INTRAVENOUS | Status: DC

## 2021-04-13 MED ORDER — LIDOCAINE HCL (PF) 1 % IJ SOLN
INTRAMUSCULAR | Status: AC
  Filled 2021-04-13: qty 30

## 2021-04-13 MED ORDER — HEPARIN (PORCINE) IN NACL 1000-0.9 UT/500ML-% IV SOLN
INTRAVENOUS | Status: AC
  Filled 2021-04-13: qty 1000

## 2021-04-13 MED ORDER — LABETALOL HCL 5 MG/ML IV SOLN: 10.0000 mg | INTRAVENOUS | Status: DC | PRN

## 2021-04-13 MED ORDER — HYDRALAZINE HCL 20 MG/ML IJ SOLN: 10.0000 mg | INTRAMUSCULAR | Status: DC | PRN

## 2021-04-13 MED ORDER — HEPARIN SODIUM (PORCINE) 1000 UNIT/ML IJ SOLN
INTRAMUSCULAR | Status: AC
  Filled 2021-04-13: qty 1

## 2021-04-13 MED ORDER — HEPARIN (PORCINE) IN NACL 1000-0.9 UT/500ML-% IV SOLN
INTRAVENOUS | Status: DC | PRN
  Administered 2021-04-13 (×2): 500 mL

## 2021-04-13 SURGICAL SUPPLY — 12 items
CATH OPTITORQUE TIG 4.0 5F (CATHETERS) ×1 IMPLANT
DEVICE RAD TR BAND REGULAR (VASCULAR PRODUCTS) ×1 IMPLANT
GLIDESHEATH SLEND SS 6F .021 (SHEATH) ×1 IMPLANT
GUIDEWIRE INQWIRE 1.5J.035X260 (WIRE) IMPLANT
INQWIRE 1.5J .035X260CM (WIRE) ×2
KIT HEART LEFT (KITS) ×2 IMPLANT
PACK CARDIAC CATHETERIZATION (CUSTOM PROCEDURE TRAY) ×2 IMPLANT
SHEATH PROBE COVER 6X72 (BAG) ×1 IMPLANT
TRANSDUCER W/STOPCOCK (MISCELLANEOUS) ×2 IMPLANT
TUBING ART PRESS 72  MALE/FEM (TUBING) ×2
TUBING ART PRESS 72 MALE/FEM (TUBING) IMPLANT
TUBING CIL FLEX 10 FLL-RA (TUBING) ×2 IMPLANT

## 2021-04-13 NOTE — Interval H&P Note (Signed)
History and Physical Interval Note:  04/13/2021 11:31 AM  [name]  has presented today for surgery, with the diagnosis of unstable angina.  The various methods of treatment have been discussed with the patient and family. After consideration of risks, benefits and other options for treatment, the patient has consented to  Procedure(s): LEFT HEART CATH AND CORONARY ANGIOGRAPHY (N/A)  PERCUTANEOUS CORONARY INTERVENTION  as a surgical intervention.  The patient's history has been reviewed, patient examined, no change in status, stable for surgery.  I have reviewed the patient's chart and labs.  Questions were answered to the patient's satisfaction.    Cath Lab Visit (complete for each Cath Lab visit)  Clinical Evaluation Leading to the Procedure:   ACS: No.  Non-ACS:    Anginal Classification: CCS III  Anti-ischemic medical therapy: Minimal Therapy (1 class of medications)  Non-Invasive Test Results: No non-invasive testing performed  Prior CABG: No previous CABG    Glenetta Hew

## 2021-04-13 NOTE — Discharge Instructions (Signed)

## 2021-04-13 NOTE — Brief Op Note (Signed)
04/13/2021  12:08 PM  PATIENT:  [name]  55 y.o. female  PRE-OPERATIVE DIAGNOSIS:  unstable angina  POST-OPERATIVE DIAGNOSIS:  angiographically minimal CAD- LAD calcification Normal LVEF & LVEDP  PROCEDURE:  Procedure(s): LEFT HEART CATH AND CORONARY ANGIOGRAPHY (N/A) - R Radial 6 F r sheath (US Guided) - Tig 4.0 Catheter for LV Gram, LCA & RCA Angiography.  SURGEON:  Surgeon(s) and Role:    * Leonie Man, MD - Primary  ANESTHESIA:   local and IV sedation  EBL:  minimal    MEDICATIONS USED:  LIDOCAINE ; IV  Versed, IV Fentanyl; 25 mL,   COUNTS:  YES  TOURNIQUET:  TR Band 10 ml, 1200  DICTATION: .Note written in [place]: Discharge to home after PACU  PATIENT DISPOSITION:  PACU - hemodynamically stable.   Delay start of Pharmacological VTE agent (>24hrs) due to surgical blood loss or risk of bleeding: not applicable

## 2021-04-14 ENCOUNTER — Encounter (HOSPITAL_COMMUNITY): Payer: Self-pay | Admitting: Cardiology

## 2021-04-18 ENCOUNTER — Other Ambulatory Visit: Payer: Self-pay | Admitting: Family Medicine

## 2021-04-18 ENCOUNTER — Other Ambulatory Visit: Payer: Self-pay | Admitting: Nurse Practitioner

## 2021-04-18 DIAGNOSIS — S92901S Unspecified fracture of right foot, sequela: Secondary | ICD-10-CM

## 2021-04-19 ENCOUNTER — Telehealth: Payer: Self-pay | Admitting: *Deleted

## 2021-04-19 NOTE — Telephone Encounter (Signed)
Our office received a duplicate request for clearance from Dr. Royce Macadamia office. I s/w pre op cardiologist today who states that it seems that we are now waiting for echo to be done 05/09/21 before seeing if the pt can be cleared. I then called and Gwen with dental office and explained that we have the original clearance from May 18/22. I explained the pt had an appt with Dr. Acie Fredrickson her cardiologist 6/15. From the appt on 6/15 where the pt c/o SOB (shortness of breath) she then in turn was scheduled for a heart cath which was done on 04/16/21. Pt is now also scheduled for an echo to be done on 05/09/21. I assured the dental office once the pt has been cleared we will be sure to send clearance notes the their office. Gwen thanked me for the update.  I will send the pre op clearance from may 15th back into the pre op pool so that we may follow up after her echo has been completed.

## 2021-04-21 ENCOUNTER — Telehealth: Payer: Self-pay | Admitting: Primary Care

## 2021-04-21 NOTE — Telephone Encounter (Signed)
Fax received from Dr. Hoyt Koch to perform a removal of 16 teeth alveoloplasty with general anesthesia on patient.  Patient needs surgery clearance. Patient was just seen 03/08/21. Office protocol is a risk assessment can be sent to surgeon if patient has been seen in 60 days or less.   Sending to Derl Barrow NP for risk assessment or recommendations if patient needs to be seen in office prior to surgical procedure.

## 2021-04-26 NOTE — Telephone Encounter (Signed)
Looks like heart surgery was done and now Dr. Hoyt Koch wants to remove teeth.  Will send this note and last OV notes to Dr. Stefanie Libel

## 2021-04-26 NOTE — Telephone Encounter (Signed)
I believe its already been completed, you can send

## 2021-04-29 DIAGNOSIS — J449 Chronic obstructive pulmonary disease, unspecified: Secondary | ICD-10-CM | POA: Diagnosis not present

## 2021-04-29 DIAGNOSIS — R32 Unspecified urinary incontinence: Secondary | ICD-10-CM | POA: Diagnosis not present

## 2021-05-05 ENCOUNTER — Ambulatory Visit (INDEPENDENT_AMBULATORY_CARE_PROVIDER_SITE_OTHER): Payer: Medicaid Other | Admitting: Family Medicine

## 2021-05-05 ENCOUNTER — Other Ambulatory Visit: Payer: Self-pay

## 2021-05-05 ENCOUNTER — Telehealth: Payer: Self-pay

## 2021-05-05 DIAGNOSIS — L814 Other melanin hyperpigmentation: Secondary | ICD-10-CM | POA: Diagnosis not present

## 2021-05-05 DIAGNOSIS — D229 Melanocytic nevi, unspecified: Secondary | ICD-10-CM | POA: Diagnosis not present

## 2021-05-05 NOTE — Telephone Encounter (Signed)
Reached out to pt regarding refills for Trelegy through [facility] patient assistance. Wanted to confirm if pt was still taking medication and if she now had insurance. Pt says she has medicaid and it should cost $3 at the pharmacy. Let pt know when she is out of refills to contact her pcp to get more sent to pharmacy. I will no longer order medication through [facility]. Pt expressed understanding.

## 2021-05-05 NOTE — Progress Notes (Signed)
m  SUBJECTIVE:   CHIEF COMPLAINT / HPI:   Chief Complaint  Patient presents with   back lesion    HX of skin cancer     [name] is a 55 y.o. female here for excision of atypical lesion on her back. Pt had biopsy performed in May 2022 that returned abnormal. Pt states area itches a lot.    PERTINENT  PMH / PSH: reviewed and updated as appropriate   OBJECTIVE:   BP 98/70   Pulse 71   Ht 5' (1.524 m)   Wt 96 lb 9.6 oz (43.8 kg)   SpO2 94%   BMI 18.87 kg/m    GEN: well appearing female in no acute distress  CVS:  visible skin is well perfused  RESP: speaking in full sentences without pause, no respiratory distress  SKIN: 3 mm bilateral erythematous nasal lesions at the bridge of the nose, 6 mm lesion to the left of midline upper back (see previous image under media), right scapula with 5 mm similar appearing hyperpigmented lesion      PROCEDURE:Skin excision of lesion   Performing Physician: Lyndee Hensen, DO     PROCEDURE  After informed written consent was obtained and an appropriate timeout, area was cleansed with alcohol and Betadine and 1% Lidocaine with epinephrine was  used for anesthetic. With sterile technique, a scalpel was used to remove a rhomboidal shaped skin lesion from the upper back.  There were somewhat narrow margins. Hemostasis was obtained with gentle pressure.  Blunt dissection/undermining allowed for loose skin flaps.  Deep layers were closed with 3 absorbable 3-0 sutures.  Superficial skin was closed with 5 non-absorbable 4-0 sutures. Wound dressed with steri-strips and a 4x4. Wound care instructions provided. The specimen was labeled and sent to pathology for evaluation.     ASSESSMENT/PLAN:   No problem-specific Assessment & Plan notes found for this encounter.  Irritated Lentigo Review of previous dermatopathology report suggests area is an irritated Lentigo. Excisional biopsy performed today and pt tolerated procedure well without  complications. Pt to return in 10-14 days for suture (5) removal. She is to be on alert for any signs of cutaneous infection. Wound care handout provided.    Nasal Lesions  Bactroban provided. Follow up in 2 weeks if not improving.   Lyndee Hensen, DO PGY-2, [facility] 05/05/2021

## 2021-05-05 NOTE — Patient Instructions (Signed)
Follow up with Dr. Arby Barrette in 10-14 days to get sutures removed. See instruction for wound care below.   Take Care,  Dr. Susa Simmonds

## 2021-05-08 ENCOUNTER — Other Ambulatory Visit: Payer: Self-pay | Admitting: Family Medicine

## 2021-05-08 DIAGNOSIS — K0889 Other specified disorders of teeth and supporting structures: Secondary | ICD-10-CM

## 2021-05-09 ENCOUNTER — Other Ambulatory Visit: Payer: Self-pay

## 2021-05-09 ENCOUNTER — Ambulatory Visit (HOSPITAL_COMMUNITY): Payer: Medicaid Other | Attending: Cardiovascular Disease

## 2021-05-09 DIAGNOSIS — I341 Nonrheumatic mitral (valve) prolapse: Secondary | ICD-10-CM | POA: Diagnosis not present

## 2021-05-09 LAB — ECHOCARDIOGRAM COMPLETE
Area-P 1/2: 3.08 cm2
S' Lateral: 2.7 cm

## 2021-05-11 ENCOUNTER — Other Ambulatory Visit: Payer: Self-pay | Admitting: Family Medicine

## 2021-05-11 ENCOUNTER — Ambulatory Visit: Payer: Medicaid Other

## 2021-05-11 DIAGNOSIS — K0889 Other specified disorders of teeth and supporting structures: Secondary | ICD-10-CM

## 2021-05-12 ENCOUNTER — Telehealth: Payer: Medicaid Other | Admitting: Physician Assistant

## 2021-05-12 DIAGNOSIS — K047 Periapical abscess without sinus: Secondary | ICD-10-CM

## 2021-05-12 DIAGNOSIS — K089 Disorder of teeth and supporting structures, unspecified: Secondary | ICD-10-CM | POA: Diagnosis not present

## 2021-05-12 DIAGNOSIS — G8929 Other chronic pain: Secondary | ICD-10-CM

## 2021-05-12 MED ORDER — AMOXICILLIN-POT CLAVULANATE 875-125 MG PO TABS
1.0000 | ORAL_TABLET | Freq: Two times a day (BID) | ORAL | 0 refills | Status: DC
Start: 2021-05-12 — End: 2021-07-01

## 2021-05-12 MED ORDER — MELOXICAM 15 MG PO TABS: 15.0000 mg | ORAL_TABLET | Freq: Every day | ORAL | 0 refills | Status: DC

## 2021-05-12 NOTE — Progress Notes (Signed)
Virtual Visit Consent   [name], you are scheduled for a virtual visit with a [facility] provider today.     Just as with appointments in the office, your consent must be obtained to participate.  Your consent will be active for this visit and any virtual visit you may have with one of our providers in the next 365 days.     If you have a MyChart account, a copy of this consent can be sent to you electronically.  All virtual visits are billed to your insurance company just like a traditional visit in the office.    As this is a virtual visit, video technology does not allow for your provider to perform a traditional examination.  This may limit your provider's ability to fully assess your condition.  If your provider identifies any concerns that need to be evaluated in person or the need to arrange testing (such as labs, EKG, etc.), we will make arrangements to do so.     Although advances in technology are sophisticated, we cannot ensure that it will always work on either your end or our end.  If the connection with a video visit is poor, the visit may have to be switched to a telephone visit.  With either a video or telephone visit, we are not always able to ensure that we have a secure connection.     I need to obtain your verbal consent now.   Are you willing to proceed with your visit today?    [name] has provided verbal consent on 05/12/2021 for a virtual visit (video or telephone).   Leeanne Rio, [place]   Date: 05/12/2021 10:05 AM   Virtual Visit via Video Note   I, Leeanne Rio, connected with  [name]  ([id number], [birth year]) on 05/12/21 at 10:15 AM EDT by a video-enabled telemedicine application and verified that I am speaking with the correct person using two identifiers.  Location: Patient: Virtual Visit Location Patient: Home Provider: Virtual Visit Location Provider: Home Office   I discussed the limitations of evaluation and management by  telemedicine and the availability of in person appointments. The patient expressed understanding and agreed to proceed.    History of Present Illness: [name] is a 55 y.o. who identifies as a female who was assigned female at birth, and is being seen today for ongoing dental pain. Patient with substantial history of dental issues including multiple severe dental caries and root damage. She has been evaluated by her dentist who is getting her set up for dental surgery. She cannot have the procedures done until getting cardiac and pulmonary clearance from her specialists giving her history of CAD and COPD. She was receiving hydrocodone from her PCP who recently left the practice. Has not been able to see her new PCP yet at the practice. Has reached out to them but states she still waiting on a response and is in a lot of pain. Denies any fevers, or chills. Has noted recent increase in gingival swelling of R lower side without drainage. Has tried OTC Tylenol, Ibuprofen, Aleve, Orajel and other topical ointments without any improvement. States she has started to drink some alcohol to help numb the pain.  HPI: HPI  Problems:  Patient Active Problem List   Diagnosis Date Noted   Unstable angina ([place]) 04/13/2021   Atypical nevi 03/17/2021   Atherosclerosis 03/08/2021   Grief reaction 03/02/2021   Acute diarrhea 12/29/2020   Acute  right lumbar radiculopathy 12/11/2020   Healthcare maintenance 12/11/2020   COPD (chronic obstructive pulmonary disease) ([place]) 06/01/2020   GAD (generalized anxiety disorder) 12/26/2019   Poor social situation 08/29/2019   Abnormal chest CT 08/29/2019   Poor dentition 06/25/2019   Lung nodule, solitary 06/25/2019   Chronic cough 11/26/2018   Recent unintentional weight loss over several months 11/05/2018   Current smoker 11/05/2018   Paresthesia 07/22/2018   Skull deformity 07/22/2018   Hyperlipidemia 07/22/2018   Depression 01/09/2012   Migraine with aura  05/09/2007   Mitral valve prolapse 05/09/2007    Allergies:  Allergies  Allergen Reactions   Acetaminophen Other (See Comments)    keeps awake, legs constantly moving    Dextromethorphan Other (See Comments)    keeps awake, legs constantly moving    Doxylamine Other (See Comments)    keeps awake, legs constantly moving    Pseudoeph-Doxylamine-Dm-Apap Other (See Comments)    REACTION: keeps awake, legs constantly moving  NYQUIL   Pseudoephedrine Hcl Other (See Comments)    keeps awake, legs constantly moving    Medications:  Current Outpatient Medications:    albuterol (PROVENTIL) (2.5 MG/3ML) 0.083% nebulizer solution, Take 3 mLs (2.5 mg total) by nebulization every 6 (six) hours as needed for wheezing or shortness of breath., Disp: 75 mL, Rfl: 0   aspirin 81 MG chewable tablet, Chew 81 mg by mouth daily., Disp: , Rfl:    busPIRone (BUSPAR) 10 MG tablet, Take 1 tablet (10 mg total) by mouth 2 (two) times daily as needed., Disp: 180 tablet, Rfl: 2   EYSUVIS 0.25 % SUSP, Place 1 drop into both eyes 3 (three) times daily., Disp: , Rfl:    Fluticasone-Umeclidin-Vilant (TRELEGY ELLIPTA) 200-62.5-25 MCG/INH AEPB, Inhale 1 puff into the lungs daily., Disp: 3 each, Rfl: 3   gabapentin (NEURONTIN) 300 MG capsule, Take 1 capsule (300 mg total) by mouth 2 (two) times daily., Disp: 90 capsule, Rfl: 3   HYDROcodone-acetaminophen (NORCO/VICODIN) 5-325 MG tablet, Take 1-2 tablets by mouth every 6 (six) hours as needed for severe pain., Disp: 20 tablet, Rfl: 0   meloxicam (MOBIC) 15 MG tablet, Take 1 tablet by mouth daily as needed for muscle spasms., Disp: , Rfl:    MITIGARE 0.6 MG CAPS, TAKE 0.6 MG BY MOUTH IN THE MORNING AND AT BEDTIME., Disp: 42 capsule, Rfl: 0   naproxen (NAPROSYN) 500 MG tablet, TAKE 1 TABLET BY MOUTH 2 TIMES DAILY WITH A MEAL., Disp: 30 tablet, Rfl: 0   nicotine (NICODERM CQ - DOSED IN MG/24 HOURS) 21 mg/24hr patch, Place 21 mg onto the skin daily., Disp: , Rfl:     nitroGLYCERIN (NITROSTAT) 0.4 MG SL tablet, Place 1 tablet (0.4 mg total) under the tongue every 5 (five) minutes as needed for chest pain., Disp: 25 tablet, Rfl: 1   omeprazole (PRILOSEC) 20 MG capsule, TAKE 1 CAPSULE BY MOUTH EVERY DAY, Disp: 30 capsule, Rfl: 0   ondansetron (ZOFRAN-ODT) 4 MG disintegrating tablet, TAKE 1 TABLET BY MOUTH EVERY 8 HOURS AS NEEDED FOR NAUSEA AND VOMITING, Disp: 30 tablet, Rfl: 0   Pilocarpine HCl (VUITY) 1.25 % SOLN, Place 1 drop into both eyes daily., Disp: , Rfl:    RESTASIS 0.05 % ophthalmic emulsion, Place 1 drop into both eyes 2 (two) times daily., Disp: , Rfl:    rosuvastatin (CRESTOR) 20 MG tablet, Take 20 mg by mouth daily., Disp: , Rfl:    sertraline (ZOLOFT) 100 MG tablet, TAKE 1 TABLET BY MOUTH EVERY DAY,  Disp: 90 tablet, Rfl: 2   varenicline (CHANTIX) 0.5 MG tablet, 0.5mg  once daily day 1-3, 0.5mg  TWICE daily day 4-7, then 1mg  twice daily day 8 and after., Disp: 30 tablet, Rfl: 1  Observations/Objective: Patient is well-developed, well-nourished in no acute distress.  Resting comfortably at home.  Head is normocephalic, atraumatic.  No labored breathing. Speech is clear and coherent with logical content.  Patient is alert and oriented at baseline.  Extremely poor dentition noted with caries visible just from video visit.  Assessment and Plan: 1. Chronic dental pain Ongoing. Very poor dentition. Unable to have her dental procedure until cardiac/pulmonary clearances. Discussed we do not provide controlled medications via video urgent care visits. If her dentist is not willing to prescribe, she will have to continue follow-up with her new PCP. Will have her continue OTC tylenol and start Meloxicam once daily to hopefully help with pain and swelling. She has been instructed to stop alcohol use for dental pain as it will increase her risk of substance misuse/abuse in the future.  - meloxicam (MOBIC) 15 MG tablet; Take 1 tablet (15 mg total) by mouth daily.   Dispense: 15 tablet; Refill: 0  2. Dental infection Concern for acute dental infection. Will add on Augmentin BID x 7 days. She is to reach out to her dentist for further management until she can have her procedure.  - amoxicillin-clavulanate (AUGMENTIN) 875-125 MG tablet; Take 1 tablet by mouth 2 (two) times daily.  Dispense: 14 tablet; Refill: 0   Follow Up Instructions: I discussed the assessment and treatment plan with the patient. The patient was provided an opportunity to ask questions and all were answered. The patient agreed with the plan and demonstrated an understanding of the instructions.  A copy of instructions were sent to the patient via MyChart.  The patient was advised to call back or seek an in-person evaluation if the symptoms worsen or if the condition fails to improve as anticipated.  Time:  I spent 12 minutes with the patient via telehealth technology discussing the above problems/concerns.    Leeanne Rio, PA-C

## 2021-05-12 NOTE — Patient Instructions (Signed)
[name], thank you for joining Leeanne Rio, PA-C for today's virtual visit.  While this provider is not your primary care provider (PCP), if your PCP is located in our provider database this encounter information will be shared with them immediately following your visit.  Consent: (Patient) [name] provided verbal consent for this virtual visit at the beginning of the encounter.  Current Medications:  Current Outpatient Medications:    albuterol (PROVENTIL) (2.5 MG/3ML) 0.083% nebulizer solution, Take 3 mLs (2.5 mg total) by nebulization every 6 (six) hours as needed for wheezing or shortness of breath., Disp: 75 mL, Rfl: 0   aspirin 81 MG chewable tablet, Chew 81 mg by mouth daily., Disp: , Rfl:    busPIRone (BUSPAR) 10 MG tablet, Take 1 tablet (10 mg total) by mouth 2 (two) times daily as needed., Disp: 180 tablet, Rfl: 2   EYSUVIS 0.25 % SUSP, Place 1 drop into both eyes 3 (three) times daily., Disp: , Rfl:    Fluticasone-Umeclidin-Vilant (TRELEGY ELLIPTA) 200-62.5-25 MCG/INH AEPB, Inhale 1 puff into the lungs daily., Disp: 3 each, Rfl: 3   gabapentin (NEURONTIN) 300 MG capsule, Take 1 capsule (300 mg total) by mouth 2 (two) times daily., Disp: 90 capsule, Rfl: 3   HYDROcodone-acetaminophen (NORCO/VICODIN) 5-325 MG tablet, Take 1-2 tablets by mouth every 6 (six) hours as needed for severe pain., Disp: 20 tablet, Rfl: 0   meloxicam (MOBIC) 15 MG tablet, Take 1 tablet by mouth daily as needed for muscle spasms., Disp: , Rfl:    MITIGARE 0.6 MG CAPS, TAKE 0.6 MG BY MOUTH IN THE MORNING AND AT BEDTIME., Disp: 42 capsule, Rfl: 0   naproxen (NAPROSYN) 500 MG tablet, TAKE 1 TABLET BY MOUTH 2 TIMES DAILY WITH A MEAL., Disp: 30 tablet, Rfl: 0   nicotine (NICODERM CQ - DOSED IN MG/24 HOURS) 21 mg/24hr patch, Place 21 mg onto the skin daily., Disp: , Rfl:    nitroGLYCERIN (NITROSTAT) 0.4 MG SL tablet, Place 1 tablet (0.4 mg total) under the tongue every 5 (five) minutes as needed for  chest pain., Disp: 25 tablet, Rfl: 1   omeprazole (PRILOSEC) 20 MG capsule, TAKE 1 CAPSULE BY MOUTH EVERY DAY, Disp: 30 capsule, Rfl: 0   ondansetron (ZOFRAN-ODT) 4 MG disintegrating tablet, TAKE 1 TABLET BY MOUTH EVERY 8 HOURS AS NEEDED FOR NAUSEA AND VOMITING, Disp: 30 tablet, Rfl: 0   Pilocarpine HCl (VUITY) 1.25 % SOLN, Place 1 drop into both eyes daily., Disp: , Rfl:    RESTASIS 0.05 % ophthalmic emulsion, Place 1 drop into both eyes 2 (two) times daily., Disp: , Rfl:    rosuvastatin (CRESTOR) 20 MG tablet, Take 20 mg by mouth daily., Disp: , Rfl:    sertraline (ZOLOFT) 100 MG tablet, TAKE 1 TABLET BY MOUTH EVERY DAY, Disp: 90 tablet, Rfl: 2   varenicline (CHANTIX) 0.5 MG tablet, 0.5mg  once daily day 1-3, 0.5mg  TWICE daily day 4-7, then 1mg  twice daily day 8 and after., Disp: 30 tablet, Rfl: 1   Medications ordered in this encounter:  No orders of the defined types were placed in this encounter.    *If you need refills on other medications prior to your next appointment, please contact your pharmacy*  Follow-Up: Call back or seek an in-person evaluation if the symptoms worsen or if the condition fails to improve as anticipated.   If you have been instructed to have an in-person evaluation today at a local Urgent Care facility, please use the link below.  It will take you to a list of all of our available [facility], including address, phone number and hours of operation. Please do not delay care.  [facility]  If you or a family member do not have a primary care provider, use the link below to schedule a visit and establish care. When you choose a [facility] primary care physician or advanced practice provider, you gain a long-term partner in health. Find a Primary Care Provider  Learn more about [facility]'s in-office and virtual care options: [facility]

## 2021-05-13 ENCOUNTER — Telehealth (INDEPENDENT_AMBULATORY_CARE_PROVIDER_SITE_OTHER): Payer: Medicaid Other | Admitting: Family Medicine

## 2021-05-13 DIAGNOSIS — K089 Disorder of teeth and supporting structures, unspecified: Secondary | ICD-10-CM | POA: Diagnosis not present

## 2021-05-13 DIAGNOSIS — K0889 Other specified disorders of teeth and supporting structures: Secondary | ICD-10-CM | POA: Diagnosis not present

## 2021-05-13 MED ORDER — HYDROCODONE-ACETAMINOPHEN 5-325 MG PO TABS: 1.0000 | ORAL_TABLET | Freq: Four times a day (QID) | ORAL | 0 refills | Status: DC | PRN

## 2021-05-13 NOTE — Assessment & Plan Note (Addendum)
Pt requesting refill for norco today for severe dental pain. She is still waiting for clearance from her pulmonologist for the dental procedure. This will be in August. Precepted pt with Dr McDiarmid who agrees with 20 tablet refill for today. PDMP reviewed. Pt will need to book clinic appointment for further refills in the future. Aim to wean narcotics after dental procedure. Pt is aware of this. Also strongly recommend she does not mix narcotics with alcohol. Pt expressed understanding.

## 2021-05-13 NOTE — Progress Notes (Signed)
[facility] Tele[facility]icine Visit  Patient consented to have virtual visit and was identified by name and date of birth. Method of visit: Video attempted   Encounter participants: Patient: [name] - located at home Provider: Lattie Haw - located at home    Chief Complaint: dental pain, covid positive  HPI:  Dental pain Pt reports severe dental pain. She was prescribed 20 tablets of norco 1 month ago by Dr Higinio Plan and has run out. She has been resorting to drinking EOTH to "numb the pain". She  drinks 2 shots a day. She is waiting to see Pulm doctor in August to get clearance for her dental procedure. She will then see her oral surgeon.  She has tried tylenol, motrin for the pain which has not helped at all. Requesting refill of norco.  ROS: per HPI  Pertinent PMHx: unstable angina, migraine with aura, COPD  Exam:  There were no vitals taken for this visit.  Respiratory: speaking in full sentences   Assessment/Plan:  Poor dentition Pt requesting refill for norco today for severe dental pain. She is still waiting for clearance from her pulmonologist for the dental procedure. This will be in August. Precepted pt with Dr McDiarmid who agrees with 20 tablet refill for today. PDMP reviewed. Pt will need to book clinic appointment for further refills in the future. Aim to wean narcotics after dental procedure. Pt is aware of this. Also strongly recommend she does not mix narcotics with alcohol. Pt expressed understanding.    Time spent during visit with patient: 10 minutes

## 2021-05-16 ENCOUNTER — Ambulatory Visit: Payer: Medicaid Other

## 2021-05-17 ENCOUNTER — Ambulatory Visit (INDEPENDENT_AMBULATORY_CARE_PROVIDER_SITE_OTHER): Payer: Medicaid Other | Admitting: Family Medicine

## 2021-05-17 ENCOUNTER — Encounter: Payer: Self-pay | Admitting: Family Medicine

## 2021-05-17 ENCOUNTER — Other Ambulatory Visit: Payer: Self-pay

## 2021-05-17 VITALS — BP 100/75 | HR 86 | Ht 60.0 in | Wt 99.2 lb

## 2021-05-17 DIAGNOSIS — R32 Unspecified urinary incontinence: Secondary | ICD-10-CM | POA: Diagnosis not present

## 2021-05-17 DIAGNOSIS — Z4802 Encounter for removal of sutures: Secondary | ICD-10-CM

## 2021-05-17 DIAGNOSIS — J449 Chronic obstructive pulmonary disease, unspecified: Secondary | ICD-10-CM | POA: Diagnosis not present

## 2021-05-17 NOTE — Patient Instructions (Signed)
Removed sutures today Someone will call when path results come back  Follow up with your PCP for regular medical issues  Be well, Dr. Heloise Purpura Removal, Care After This sheet gives you information about how to care for yourself after your procedure. Your health care provider may also give you more specific instructions. If you have problems or questions, contact your health careprovider. What can I expect after the procedure? After your stitches (sutures) are removed, it is common to have: Some discomfort and swelling in the area. Slight redness in the area. Follow these instructions at home: If you have a bandage: Wash your hands with soap and water before you change your bandage (dressing). If soap and water are not available, use hand sanitizer. Change your dressing as told by your health care provider. If your dressing becomes wet or dirty, or develops a bad smell, change it as soon as possible. If your dressing sticks to your skin, soak it in warm water to loosen it. Wound care  Check your wound every day for signs of infection. Check for: More redness, swelling, or pain. Fluid or blood. Warmth. Pus or a bad smell. Wash your hands with soap and water before and after touching your wound. Apply cream or ointment only as directed by your health care provider. If you are using cream or ointment, wash the area with soap and water 2 times a day to remove all the cream or ointment. Rinse off the soap and pat the area dry with a clean towel. If you have skin glue or adhesive strips on your wound, leave these closures in place. They may need to stay in place for 2 weeks or longer. If adhesive strip edges start to loosen and curl up, you may trim the loose edges. Do not remove adhesive strips completely unless your health care provider tells you to do that. Keep the wound area dry and clean. Do not take baths, swim, or use a hot tub until your health care provider approves. Continue  to protect the wound from injury. Do not pick at your wound. Picking can cause an infection. When your wound has completely healed, wear sunscreen over it or cover it with clothing when you are outside. New scars get sunburned easily, which can make scarring worse.  General instructions Take over-the-counter and prescription medicines only as told by your health care provider. Keep all follow-up visits as told by your health care provider. This is important. Contact a health care provider if: You have redness, swelling, or pain around your wound. You have fluid or blood coming from your wound. Your wound feels warm to the touch. You have pus or a bad smell coming from your wound. Your wound opens up. Get help right away if: You have a fever. You have redness that is spreading from your wound. Summary After your sutures are removed, it is common to have some discomfort and swelling in the area. Wash your hands with soap and water before you change your bandage (dressing). Keep the wound area dry and clean. Do not take baths, swim, or use a hot tub until your health care provider approves. This information is not intended to replace advice given to you by your health care provider. Make sure you discuss any questions you have with your healthcare provider. Document Revised: 08/12/2020 Document Reviewed: 08/13/2020 Elsevier Patient Education  2022 [employer].

## 2021-05-17 NOTE — Progress Notes (Signed)
  Date of Visit: 05/17/2021   SUBJECTIVE:   HPI:  [name] presents today for removal of sutures on her back. Had excisional biopsy on 7/7 of irritated lentigo, path results from that not yet back. She desires to have sutures removed as they are beginning to itch. She otherwise has no concerns or complaints today.  OBJECTIVE:   BP 100/75   Pulse 86   Ht 5' (1.524 m)   Wt 99 lb 3.2 oz (45 kg)   SpO2 96%   BMI 19.37 kg/m  Gen: no acute distress, pleasant, cooperative Skin: 5 sutures overlying well-healed excisional incision site on back. All 5 sutures removed without difficulty, wound remained intact without any signs of dehiscence.  ASSESSMENT/PLAN:   Health maintenance:  -encouraged COVID vaccination, patient declines at this time  Suture removal Sutures removed today without difficulty Path report not yet back, advised we will contact her when it returns No charge visit  [name], [place]

## 2021-05-31 ENCOUNTER — Telehealth: Payer: Self-pay | Admitting: Cardiovascular Disease

## 2021-05-31 NOTE — Telephone Encounter (Signed)
Spoke with pt who states she is sick with cough and vomiting.  Advised per Dr Acie Fredrickson he would prefer pt have appointment rescheduled rather than do a virtual visit.  Will have scheduling contact pt re: new appointment.  Pt verbalizes understanding and agrees with current plan.

## 2021-05-31 NOTE — Telephone Encounter (Signed)
Patient is sick was calling to see if she could switch her appt tomorrow to a virtual visit. Please advise

## 2021-06-01 ENCOUNTER — Ambulatory Visit: Payer: Medicaid Other | Admitting: Cardiovascular Disease

## 2021-06-06 ENCOUNTER — Other Ambulatory Visit (HOSPITAL_COMMUNITY): Payer: Medicaid Other

## 2021-06-06 ENCOUNTER — Other Ambulatory Visit: Payer: Self-pay | Admitting: Cardiovascular Disease

## 2021-06-06 LAB — SARS CORONAVIRUS 2 (TAT 6-24 HRS): SARS Coronavirus 2: NEGATIVE

## 2021-06-07 DIAGNOSIS — R32 Unspecified urinary incontinence: Secondary | ICD-10-CM | POA: Diagnosis not present

## 2021-06-07 DIAGNOSIS — J449 Chronic obstructive pulmonary disease, unspecified: Secondary | ICD-10-CM | POA: Diagnosis not present

## 2021-06-09 ENCOUNTER — Encounter: Payer: Self-pay | Admitting: Primary Care

## 2021-06-09 ENCOUNTER — Ambulatory Visit: Payer: Medicaid Other | Admitting: Primary Care

## 2021-06-09 ENCOUNTER — Telehealth: Payer: Self-pay

## 2021-06-09 ENCOUNTER — Ambulatory Visit (INDEPENDENT_AMBULATORY_CARE_PROVIDER_SITE_OTHER): Payer: Medicaid Other | Admitting: Internal Medicine

## 2021-06-09 ENCOUNTER — Other Ambulatory Visit: Payer: Self-pay

## 2021-06-09 VITALS — BP 96/60 | HR 66 | Temp 98.2°F | Ht 61.0 in | Wt 100.0 lb

## 2021-06-09 DIAGNOSIS — R053 Chronic cough: Secondary | ICD-10-CM

## 2021-06-09 DIAGNOSIS — J449 Chronic obstructive pulmonary disease, unspecified: Secondary | ICD-10-CM

## 2021-06-09 DIAGNOSIS — F17210 Nicotine dependence, cigarettes, uncomplicated: Secondary | ICD-10-CM

## 2021-06-09 DIAGNOSIS — J849 Interstitial pulmonary disease, unspecified: Secondary | ICD-10-CM | POA: Diagnosis not present

## 2021-06-09 LAB — PULMONARY FUNCTION TEST
DL/VA % pred: 83 %
DL/VA: 3.64 ml/min/mmHg/L
DLCO cor % pred: 43 %
DLCO cor: 8.09 ml/min/mmHg
DLCO unc % pred: 43 %
DLCO unc: 8.09 ml/min/mmHg
FEF 25-75 Post: 1.83 L/sec
FEF 25-75 Pre: 1.46 L/sec
FEF2575-%Change-Post: 25 %
FEF2575-%Pred-Post: 75 %
FEF2575-%Pred-Pre: 60 %
FEV1-%Change-Post: 3 %
FEV1-%Pred-Post: 55 %
FEV1-%Pred-Pre: 53 %
FEV1-Post: 1.34 L
FEV1-Pre: 1.3 L
FEV1FVC-%Change-Post: 7 %
FEV1FVC-%Pred-Pre: 101 %
FEV6-%Change-Post: -3 %
FEV6-%Pred-Post: 51 %
FEV6-%Pred-Pre: 53 %
FEV6-Post: 1.55 L
FEV6-Pre: 1.61 L
FEV6FVC-%Change-Post: 0 %
FEV6FVC-%Pred-Post: 103 %
FEV6FVC-%Pred-Pre: 102 %
FVC-%Change-Post: -3 %
FVC-%Pred-Post: 50 %
FVC-%Pred-Pre: 52 %
FVC-Post: 1.55 L
FVC-Pre: 1.61 L
Post FEV1/FVC ratio: 87 %
Post FEV6/FVC ratio: 100 %
Pre FEV1/FVC ratio: 81 %
Pre FEV6/FVC Ratio: 100 %
RV % pred: 110 %
RV: 1.9 L
TLC % pred: 76 %
TLC: 3.52 L

## 2021-06-09 MED ORDER — ALBUTEROL SULFATE HFA 108 (90 BASE) MCG/ACT IN AERS: 1.0000 | INHALATION_SPRAY | Freq: Four times a day (QID) | RESPIRATORY_TRACT | 2 refills | Status: DC | PRN

## 2021-06-09 MED ORDER — ONDANSETRON 4 MG PO TBDP: ORAL_TABLET | ORAL | 0 refills | Status: DC

## 2021-06-09 MED ORDER — ALBUTEROL SULFATE (2.5 MG/3ML) 0.083% IN NEBU: 2.5000 mg | INHALATION_SOLUTION | Freq: Four times a day (QID) | RESPIRATORY_TRACT | 0 refills | Status: DC | PRN

## 2021-06-09 NOTE — Telephone Encounter (Signed)
Pt needs appointment for smoking cessation program .

## 2021-06-09 NOTE — Patient Instructions (Addendum)
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 Treatment is that you COMPLETELY QUIT smoking. We will likely repeat imaging in November. If not better may need to discuss bronchoscopy   Recommendation: - Continue Trelegy 232mg one puff daily in morning  - Use albuterol 2 puffs every 6 hours as needed for shortness of breath/wheezing - Take robitussin 200 to 400 mg every 4 hours as needed; do not exceed 6 doses in 24 hours  - We will likely repeat imaging around November 2022 - YOU MUST QUIT SMOKING!!!! Taper amount and pick quit date  - We refilled albuterol nebulizer (let [place] if you rather the rescue inhaler)   Follow-up: - Please set up smoking cessation visit with our pharmacist  - 3 months with Dr. RChase Caller(30 min ILD slot)    Steps to Quit Smoking Smoking tobacco is the leading cause of preventable death. It can affect almost every organ in the body. Smoking puts you and people around you at risk for many serious, long-lasting (chronic) diseases. Quitting smoking can be hard, but it is one of the best things thatyou can do for your health. It is never too late to quit. How do I get ready to quit? When you decide to quit smoking, make a plan to help you succeed. Before you quit: Pick a date to quit. Set a date within the next 2 weeks to give you time to prepare. Write down the reasons why you are quitting. Keep this list in places where you will see it often. Tell your family, friends, and co-workers that you are quitting. Their support is important. Talk with your doctor about the choices that may help you quit. Find out if your health insurance will pay for these treatments. Know the people, places, things, and activities that make you want to smoke (triggers). Avoid them. What first steps can I take to quit smoking? Throw away all cigarettes at home, at work, and in your car. Throw away  the things that you use when you smoke, such as ashtrays and lighters. Clean your car. Make sure to empty the ashtray. Clean your home, including curtains and carpets. What can I do to help me quit smoking? Talk with your doctor about taking medicines and seeing a counselor at the same time. You are more likely to succeed when you do both. If you are pregnant or breastfeeding, talk with your doctor about counseling or other ways to quit smoking. Do not take medicine to help you quit smoking unless your doctor tells you to do so. To quit smoking: Quit right away Quit smoking totally, instead of slowly cutting back on how much you smoke over a period of time. Go to counseling. You are more likely to quit if you go to counseling sessions regularly. Take medicine You may take medicines to help you quit. Some medicines need a prescription, and some you can buy over-the-counter. Some medicines may contain a drug called nicotine to replace the nicotine in cigarettes. Medicines may: Help you to stop having the desire to smoke (cravings). Help to stop the problems that come when you stop smoking (withdrawal symptoms). Your doctor may ask you to use: Nicotine patches, gum, or lozenges. Nicotine inhalers or sprays. Non-nicotine medicine that is taken by mouth. Find resources Find resources and other ways to help you quit smoking and remain smoke-free after you quit. These resources are most helpful when you use  them often. They include: Online chats with a Social worker. Phone quitlines. Printed Furniture conservator/restorer. Support groups or group counseling. Text messaging programs. Mobile phone apps. Use apps on your mobile phone or tablet that can help you stick to your quit plan. There are many free apps for mobile phones and tablets as well as websites. Examples include Quit Guide from the [employer] and smokefree.gov  What things can I do to make it easier to quit?  Talk to your family and friends. Ask them to  support and encourage you. Call a phone quitline (1-800-QUIT-NOW), reach out to support groups, or work with a Social worker. Ask people who smoke to not smoke around you. Avoid places that make you want to smoke, such as: Bars. Parties. Smoke-break areas at work. Spend time with people who do not smoke. Lower the stress in your life. Stress can make you want to smoke. Try these things to help your stress: Getting regular exercise. Doing deep-breathing exercises. Doing yoga. Meditating. Doing a body scan. To do this, close your eyes, focus on one area of your body at a time from head to toe. Notice which parts of your body are tense. Try to relax the muscles in those areas. How will I feel when I quit smoking? Day 1 to 3 weeks Within the first 24 hours, you may start to have some problems that come from quitting tobacco. These problems are very bad 2-3 days after you quit, but they do not often last for more than 2-3 weeks. You may get these symptoms: Mood swings. Feeling restless, nervous, angry, or annoyed. Trouble concentrating. Dizziness. Strong desire for high-sugar foods and nicotine. Weight gain. Trouble pooping (constipation). Feeling like you may vomit (nausea). Coughing or a sore throat. Changes in how the medicines that you take for other issues work in your body. Depression. Trouble sleeping (insomnia). Week 3 and afterward After the first 2-3 weeks of quitting, you may start to notice more positive results, such as: Better sense of smell and taste. Less coughing and sore throat. Slower heart rate. Lower blood pressure. Clearer skin. Better breathing. Fewer sick days. Quitting smoking can be hard. Do not give up if you fail the first time. Some people need to try a few times before they succeed. Do your best to stick to your quit plan, and talk with yourdoctor if you have any questions or concerns. Summary Smoking tobacco is the leading cause of preventable death.  Quitting smoking can be hard, but it is one of the best things that you can do for your health. When you decide to quit smoking, make a plan to help you succeed. Quit smoking right away, not slowly over a period of time. When you start quitting, seek help from your doctor, family, or friends. This information is not intended to replace advice given to you by your health care provider. Make sure you discuss any questions you have with your healthcare provider. Document Revised: 07/11/2019 Document Reviewed: 01/04/2019 Elsevier Patient Education  [place].

## 2021-06-09 NOTE — Progress Notes (Signed)
PFT done today. 

## 2021-06-09 NOTE — Progress Notes (Signed)
$'@Patient'w$  ID: [name], female    DOB: [birth year], 55 y.o.   MRN: [id number]  No chief complaint on file.   Referring provider: Patriciaann Clan, DO  HPI:  55 year old female, former smoker quit in April 2022. PMH significant for COPD, chronic cough. Patient of [name], see for initial consult on 02/22/21 for dyspnea on exertion.   Previous [facility] pulmonary encounter: 02/22/21- Dr. Arbutus Leas M [place] 55 y.o. -presents for pulm evaluation because of chronic cough and shortness of breath.  She tells me that she has cough and shortness of breath for few to several years.  Slowly it has been progressive.  In March 2022 she had COVID-19.  Treated as outpatient.  This did not change her baseline symptoms.  She feels symptoms are significant as documented below.  There is associated wheezing.  She is on Trelegy and this helps her.  Albuterol also helps.  She says she has been given a diagnosis of COPD based on CT scan of the chest and symptoms.  She says she has had exacerbations treated by prednisone 3 times as outpatient.  This is also helped her symptoms.  She has over 25 pack smoking history.  Probably over 30 pack.  She quit for many years and then finally started smoking again 5 years ago because of stressors in life.  She finally quit again 2 weeks ago although she says it is very hard to maintain remission.  Even in the years when she quit smoking she was still exposed to the wood smoke.  Her mom continues to smoke and she is exposed to passive smoke.  Her mom has COPD so this family history of COPD.  Unclear if this alpha-1 antitrypsin phenotype.    Of note: She has poor dentition.  She is seen Dr. Gretta Cool in [place] oral surgery. Given her chronic cough and wheezing and shortness of breath they wanted pulmonary clearance.    03/08/2021 Patient presents today for follow-up. During last visit she was ordered for labs including CBC with diff, IgE, RAST allergy panel and  Alpha 1 phenotype. She was also ordered for HRCT. Recommended to continue Trelegy. Pre-op eval for Dr. Gretta Cool DDS at follow-up, may need cardiology eval/clearance if imaging shows any significant cardiac findings. CT Imaging showed significant worsening of chronic extensive patchy ground glass opacities throughout both lungs with associated mild patchy subpleural reticulation and mild cylindrical bronchiectasis. Favoring desquamative interstitial pneumonia. Three vessel coronary atherosclerosis.   She feels her breathing has improve some. She still has a np congested cough, this is not new. She is unable to produce mucus. She is still smoking 10 cigarettes a day. She is interested in quitting and has in the past several years ago. Her primary care physican just put her on chantix. She has no PFTs on file. She is needing clearance for dental work, unclear type of anesthesia. She had no issues with recent colonscopy several weeks ago.    06/09/2021 - Interim hx  Patient presents today for 3 month follow-up with PFTs. Current smoker, she is still smoking 10 cigarettes a day. She is having a hard time quitting, she tells me that everyone in her life smokes around her. She was unable to tolerate chantix d/t nausea/vomiting for 1 month. She reports improvement in breathing with Trelegy. Uses SABA on average once a day. Albuterol helped her wheezing after she took it today for her breathing test. She still has a chronic np  cough, intermittent wheezing and dyspnea on exertion. She was unable to take mucinex because she can not swallow large pills. HRCT imaging in May 2022 showed significant worsening of smoking related ILD changes, at her last visit we stressed importance of smoking cessation. Breathing test today showed moderate-severe restriction and severe diffusion defect. 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 FENO 23.     SYMPTOM SCALE - Pulm 02/22/2021   06/09/2021   O2 use ra RA  Shortness of Breath 0 -> 5  scale with 5 being worst (score 6 If unable to do)   At rest 2 2  Simple tasks - showers, clothes change, eating, shaving 2 3  Household (dishes, doing bed, laundry) 3.5 4  Shopping 4 4  Walking level at own pace 2 3  Walking up Stairs 3 4  Total (30-36) Dyspnea Score 18.5 20  How bad is your cough? 4 3  How bad is your fatigue 5 5  How bad is nausea 4 4  How bad is vomiting?   2 2  How bad is diarrhea? 0 0  How bad is anxiety? 5 3  How bad is depression 5 3    Pulmonary function testing 06/09/2021-FVC 1.55 (50%), FEV1 1.34 (55%), ratio 87, TLC 76%, DLCOunc 8.09 (43%)  Labs:  02/25/21 - Eos absolute 700; IgE 2. Rast allergy panel negative.  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 Significant worsening of chronic extensive patchy ground-glass opacities throughout both lungs, lower lobe predominant, with associated mild patchy subpleural reticulation and mild cylindrical bronchiectasis. 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 2. Three-vessel coronary atherosclerosis. 3. Diffuse bronchial wall thickening and mild centrilobular emphysema, compatible with the provided history of COPD. 4. Aortic Atherosclerosis (ICD10-I70.0) and Emphysema (ICD10-J43.9).   Exposure hx  - She smoked from age 31 to age 59 x 1 pack/day.  Then she quit till age 67 and for the last 5 years has been smoking 1 pack a day again and then quit 2 weeks ago.  Total smoking history then becomes 24 pack/day   -She owns a large property according to history.  Unclear if it is rule out commercial or residential line.  Nevertheless she does burn a lot of wood on a regular basis constantly and gets exposed to the wood smoke.  Although does not stove is wood smoke exposure there is significant   -No roaches or mildew in the house.  No mold in the house.  No bird feather in the house.  No pet birds or parakeets.  No gerbils no hamsters.  No  down pillow.  No feather jackets.  There are no roaches in the house.    Allergies  Allergen Reactions   Acetaminophen Other (See Comments)    keeps awake, legs constantly moving    Dextromethorphan Other (See Comments)    keeps awake, legs constantly moving    Doxylamine Other (See Comments)    keeps awake, legs constantly moving    Pseudoeph-Doxylamine-Dm-Apap Other (See Comments)    REACTION: keeps awake, legs constantly moving  NYQUIL   Pseudoephedrine Hcl Other (See Comments)    keeps awake, legs constantly moving     Immunization History  Administered Date(s) Administered   Influenza,inj,Quad PF,6+ Mos 12/10/2020   Pneumococcal-Unspecified 02/10/2021   Td 05/30/2002   Tdap 11/12/2011, 02/16/2021   Zoster Recombinat (Shingrix) 12/12/2020, 02/16/2021    Past Medical History:  Diagnosis Date   Allergy  Anemia    during pregnancies   Anxiety    Arthritis    hands and hip   Cancer ([place])    CAP (community acquired pneumonia) 01/07/2020   Chronic kidney disease    KIDNEYSTONES   Cigarette smoker 11/05/2018   COPD (chronic obstructive pulmonary disease) ([facility])    Depression    Heart murmur    MITRAL VALVE PROLASP   High cholesterol    History of endometriosis    Incontinence    Low back pain radiating to both legs 07/25/2020   Menopausal symptoms    Menopause 01/09/2012   Migraine    Myocardial infarction ([place])    silent 3 years ago   Night sweats 10/05/2019   Osteoporosis    Paresthesia of foot, bilateral 03/28/2019   Productive cough 12/25/2019   Renal stone 06/01/2020   Skin cancer    Thyroid disease    Tick bite 02/10/2019    Tobacco History: Social History   Tobacco Use  Smoking Status Every Day   Packs/day: 1.00   Years: 19.00   Pack years: 19.00   Types: Cigarettes  Smokeless Tobacco Never  Tobacco Comments   10 cigerettes daily.  06/09/2021   Ready to quit: Not Answered Counseling given: Not Answered Tobacco comments: 10 cigerettes daily.   06/09/2021   Outpatient Medications Prior to Visit  Medication Sig Dispense Refill   amoxicillin-clavulanate (AUGMENTIN) 875-125 MG tablet Take 1 tablet by mouth 2 (two) times daily. (Patient not taking: Reported on 06/22/2021) 14 tablet 0   aspirin 81 MG chewable tablet Chew 81 mg by mouth daily.     busPIRone (BUSPAR) 10 MG tablet Take 1 tablet (10 mg total) by mouth 2 (two) times daily as needed. 180 tablet 2   EYSUVIS 0.25 % SUSP Place 1 drop into both eyes 3 (three) times daily.     Fluticasone-Umeclidin-Vilant (TRELEGY ELLIPTA) 200-62.5-25 MCG/INH AEPB Inhale 1 puff into the lungs daily. 3 each 3   meloxicam (MOBIC) 15 MG tablet Take 1 tablet (15 mg total) by mouth daily. 15 tablet 0   MITIGARE 0.6 MG CAPS TAKE 0.6 MG BY MOUTH IN THE MORNING AND AT BEDTIME. 42 capsule 0   nicotine (NICODERM CQ - DOSED IN MG/24 HOURS) 21 mg/24hr patch Place 21 mg onto the skin daily. (Patient not taking: Reported on 06/22/2021)     nitroGLYCERIN (NITROSTAT) 0.4 MG SL tablet Place 1 tablet (0.4 mg total) under the tongue every 5 (five) minutes as needed for chest pain. 25 tablet 1   Pilocarpine HCl (VUITY) 1.25 % SOLN Place 1 drop into both eyes daily.     RESTASIS 0.05 % ophthalmic emulsion Place 1 drop into both eyes 2 (two) times daily.     sertraline (ZOLOFT) 100 MG tablet TAKE 1 TABLET BY MOUTH EVERY DAY 90 tablet 2   albuterol (PROVENTIL) (2.5 MG/3ML) 0.083% nebulizer solution Take 3 mLs (2.5 mg total) by nebulization every 6 (six) hours as needed for wheezing or shortness of breath. 75 mL 0   gabapentin (NEURONTIN) 300 MG capsule Take 1 capsule (300 mg total) by mouth 2 (two) times daily. 90 capsule 3   HYDROcodone-acetaminophen (NORCO/VICODIN) 5-325 MG tablet Take 1-2 tablets by mouth every 6 (six) hours as needed for severe pain. 20 tablet 0   omeprazole (PRILOSEC) 20 MG capsule TAKE 1 CAPSULE BY MOUTH EVERY DAY 30 capsule 0   ondansetron (ZOFRAN-ODT) 4 MG disintegrating tablet TAKE 1 TABLET BY MOUTH  EVERY 8 HOURS AS NEEDED  FOR NAUSEA AND VOMITING 30 tablet 0   rosuvastatin (CRESTOR) 20 MG tablet Take 20 mg by mouth daily.     varenicline (CHANTIX) 0.5 MG tablet 0.'5mg'$  once daily day 1-3, 0.'5mg'$  TWICE daily day 4-7, then '1mg'$  twice daily day 8 and after. 30 tablet 1   No facility-administered medications prior to visit.   Review of Systems  Review of Systems  Constitutional: Negative.   HENT: Negative.    Respiratory:  Positive for cough, shortness of breath and wheezing.     Physical Exam  BP 96/60 (BP Location: Left Arm, Patient Position: Sitting, Cuff Size: Normal)   Pulse 66   Temp 98.2 F (36.8 C) (Oral)   Ht '5\' 1"'$  (1.549 m)   Wt 100 lb (45.4 kg)   SpO2 93%   BMI 18.89 kg/m  Physical Exam Constitutional:      General: She is not in acute distress.    Appearance: Normal appearance. She is not ill-appearing.  HENT:     Head: Normocephalic and atraumatic.  Cardiovascular:     Rate and Rhythm: Normal rate and regular rhythm.  Pulmonary:     Effort: Pulmonary effort is normal.     Breath sounds: Wheezing and rales present.  Skin:    General: Skin is warm and dry.  Neurological:     General: No focal deficit present.     Mental Status: She is alert and oriented to person, place, and time. Mental status is at baseline.  Psychiatric:        Mood and Affect: Mood normal.        Behavior: Behavior normal.        Thought Content: Thought content normal.        Judgment: Judgment normal.     Lab Results:  CBC    Component Value Date/Time   WBC 9.6 04/11/2021 1119   WBC 7.5 02/25/2021 1013   RBC 4.34 04/11/2021 1119   RBC 4.33 02/25/2021 1013   HGB 12.9 04/11/2021 1119   HCT 38.8 04/11/2021 1119   PLT 320 04/11/2021 1119   MCV 89 04/11/2021 1119   MCH 29.7 04/11/2021 1119   MCH 30.5 01/29/2021 2230   MCHC 33.2 04/11/2021 1119   MCHC 33.2 02/25/2021 1013   RDW 16.7 (H) 04/11/2021 1119   LYMPHSABS 1.8 02/25/2021 1013   LYMPHSABS 2.2 12/10/2020 1114   MONOABS  0.4 02/25/2021 1013   EOSABS 0.7 02/25/2021 1013   EOSABS 0.2 12/10/2020 1114   BASOSABS 0.1 02/25/2021 1013   BASOSABS 0.1 12/10/2020 1114    BMET    Component Value Date/Time   NA 135 04/11/2021 1119   K 4.3 04/11/2021 1119   CL 99 04/11/2021 1119   CO2 29 04/11/2021 1119   GLUCOSE 92 04/11/2021 1119   GLUCOSE 145 (H) 01/29/2021 2230   BUN 8 04/11/2021 1119   CREATININE 0.64 04/11/2021 1119   CREATININE 0.66 10/11/2011 1137   CALCIUM 8.8 04/11/2021 1119   GFRNONAA >60 01/29/2021 2230   GFRAA 106 12/10/2020 1114    BNP No results found for: BNP  ProBNP No results found for: PROBNP  Imaging: No results found.   Assessment & Plan:   ILD (interstitial lung disease) ([place]) - Due to current smoking history, image findings favored to reflect desquamative interstitial pneumonia (DIP). Her symptom scale is relatively baseline for her compared to April 2022. She did not desaturate on ambulatory walk test today. Discussed with [name]. Treatment is to COMPLETELY stop smoking. Once  she has quit would recommend repeating imaging, tentatively planned for November 2022. If unable to stop smoking next option would be bronchoscopy with lavage. She is aware of the above, plan follow-up in 3 months.   COPD (chronic obstructive pulmonary disease) ([place]) - She reports improvement in breathing with Trelegy 28mg. 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 FENO 23. She still has a chronic np cough, intermittent wheezing and dyspnea on exertion. Albuterol helps, she uses SABA once a day.    Recommendation: - Continue Trelegy 2041m one puff daily in morning  - Use albuterol 2 puffs every 6 hours as needed for shortness of breath/wheezing - Take robitussin 200 to 400 mg every 4 hours as needed; do not exceed 6 doses in 24 hours  - We will likely repeat imaging around November  2022 - YOU MUST QUIT SMOKING!!!! Taper amount and pick quit date   Follow-up: - Please set up smoking cessation visit with our pharmacist  - 3 months with Dr. RaChase Caller30 min ILD slot)  ElMartyn EhrichNP 06/27/2021

## 2021-06-10 ENCOUNTER — Other Ambulatory Visit: Payer: Self-pay | Admitting: Family Medicine

## 2021-06-10 DIAGNOSIS — R202 Paresthesia of skin: Secondary | ICD-10-CM

## 2021-06-10 DIAGNOSIS — S92901S Unspecified fracture of right foot, sequela: Secondary | ICD-10-CM

## 2021-06-16 ENCOUNTER — Telehealth: Payer: Self-pay

## 2021-06-16 DIAGNOSIS — F17200 Nicotine dependence, unspecified, uncomplicated: Secondary | ICD-10-CM

## 2021-06-16 NOTE — Telephone Encounter (Signed)
   Patient Name: [name]  DOB: 1965/11/06 MRN: [id number]  Primary Cardiologist: Mertie Moores, MD  Chart reviewed as part of pre-operative protocol coverage.   Per Dr. Acie Fredrickson: She needs to have extensive dental work. She is at low risk for her dental procedure.   She does not need SBE prophylaxis from a cardiac standpoint    I will route this recommendation to the requesting party via Epic fax function and remove from pre-op pool.  Please call with questions.  [place], PA 06/16/2021, 2:18 PM

## 2021-06-16 NOTE — Telephone Encounter (Signed)
Smoking cessation referral pending 

## 2021-06-16 NOTE — Telephone Encounter (Signed)
Patient is following up regarding the clearance for her oral surgery. She states Dr. Royce Macadamia office is wanting to know if she's cleared because they never received a recommendation. Is someone able to send them the recommendation and return call to the patient with confirmation?

## 2021-06-20 NOTE — Telephone Encounter (Signed)
Will route to callback to assist

## 2021-06-20 NOTE — Telephone Encounter (Signed)
Patient called back asking that the info be refax to the office. Please advise

## 2021-06-20 NOTE — Telephone Encounter (Signed)
I will re-fax clearance notes to Dr. Diona Browner, DDS office fax # [phone].

## 2021-06-21 NOTE — Progress Notes (Signed)
    SUBJECTIVE:   CHIEF COMPLAINT / HPI:   [name] is a 55 yo who presents to complete dental paperwork, discuss migraines, and refill of medication.   Was seen by pulm 8/11 for dental clearance. Breathing test performed at visit showed moderate-severe restriction and severe diffusion defect- Alpha1 phenotype   Was seen at the clinic on 7/15 and was Norco was refilled and it was discussed that she will need clinic visits for refills and will wean after dental surgery.    Today she endorses migraines worsened with light and sound. States they occur about 2-3 times a month, often lasting for several days. Last migraine was 1 week ago and occurred for 4 days.   States she has quit smoking cold [place] for the past several weeks   Requests for me to complete her paperwork for dental clearance.   PMH Mitral valve prolapse, unstable angina, migraine with aura, COPD, poor dentition, GAD, depression   OBJECTIVE:   BP 100/70   Pulse 72   Ht '5\' 1"'$  (1.549 m)   Wt 101 lb 3.2 oz (45.9 kg)   SpO2 95%   BMI 19.12 kg/m    General: alert, NAD HEENT: dentition poor with multiple caries and missing teeth  CV: RRR no murmrs Resp: diffuse course lung sounds. normal WOB GI: soft, non distended Derm: no visible rashes or lesions    ASSESSMENT/PLAN:   No problem-specific Assessment & Plan notes found for this encounter.   Dental pain Patient awaiting clearance for dental surgery. Refilled her Norco/Vicodin but advised to take Meloxicam first. Discussed that her Norco will be weaned off after dental surgery. She expressed understanding  Migraines Chronic migraines occurring for several days about 2-3 times a month. Previously was seeing neurologist until she lost insurance. Now that she has insurance again she is requesting a referral. Declines a Triptan for migraines due to potential side effects and does not want a different medication for migraine prevention. Will await Neurology referral.    Hyperlipidemia LDL 246, total cholesterol 308. Increased Crestor from '20mg'$  to '40mg'$  daily    [place]

## 2021-06-22 ENCOUNTER — Ambulatory Visit: Payer: Medicaid Other | Admitting: Family Medicine

## 2021-06-22 ENCOUNTER — Other Ambulatory Visit: Payer: Self-pay

## 2021-06-22 ENCOUNTER — Other Ambulatory Visit (HOSPITAL_COMMUNITY): Payer: Self-pay

## 2021-06-22 VITALS — BP 100/70 | HR 72 | Ht 61.0 in | Wt 101.2 lb

## 2021-06-22 DIAGNOSIS — K0889 Other specified disorders of teeth and supporting structures: Secondary | ICD-10-CM

## 2021-06-22 DIAGNOSIS — G43109 Migraine with aura, not intractable, without status migrainosus: Secondary | ICD-10-CM | POA: Diagnosis not present

## 2021-06-22 MED ORDER — VARENICLINE TARTRATE 0.5 MG PO TABS
0.5000 mg | ORAL_TABLET | Freq: Every day | ORAL | 2 refills | Status: DC
  Filled 2021-06-22 (×2): qty 30, 30d supply, fill #0

## 2021-06-22 MED ORDER — ROSUVASTATIN CALCIUM 40 MG PO TABS: 40.0000 mg | ORAL_TABLET | Freq: Every day | ORAL | 3 refills | Status: DC

## 2021-06-22 MED ORDER — HYDROCODONE-ACETAMINOPHEN 5-325 MG PO TABS: 1.0000 | ORAL_TABLET | Freq: Four times a day (QID) | ORAL | 0 refills | Status: DC | PRN

## 2021-06-22 NOTE — Patient Instructions (Addendum)
It was great seeing you today!  Today you came in to get forms filled out for dental surgery. We will send those off once completed and let you know when we do.   We have increased your rosuvastatin to '40mg'$  daily. I will refill your pain medication. I recommend using Meloxicam daily first before your Norco.   For your migraines I will prescribe Rizatriptan for when you have headaches   Please check-out at the front desk before leaving the clinic. I'd like to see you back in 1 month for followup, but if you need to be seen earlier than that for any new issues we're happy to fit you in, just give [place] a call!  Visit Reminders: - Stop by the pharmacy to pick up your prescriptions  - Continue to work on your healthy eating habits and incorporating exercise into your daily life.    Feel free to call with any questions or concerns at any time, at [phone].   Take care,  Dr. Shary Key [facility]
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
## 2021-06-22 NOTE — Assessment & Plan Note (Signed)
Start chantix 0.5 mg QAM

## 2021-06-23 ENCOUNTER — Other Ambulatory Visit (HOSPITAL_COMMUNITY): Payer: Self-pay

## 2021-06-24 ENCOUNTER — Encounter: Payer: Self-pay | Admitting: Neurology

## 2021-06-24 ENCOUNTER — Other Ambulatory Visit (HOSPITAL_COMMUNITY): Payer: Self-pay

## 2021-06-27 ENCOUNTER — Encounter: Payer: Self-pay | Admitting: Primary Care

## 2021-06-27 DIAGNOSIS — J849 Interstitial pulmonary disease, unspecified: Secondary | ICD-10-CM | POA: Insufficient documentation

## 2021-06-27 LAB — POCT EXHALED NITRIC OXIDE: FeNO level (ppb): 23

## 2021-06-27 NOTE — Assessment & Plan Note (Addendum)
-   She reports improvement in breathing with Trelegy 234mg. 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 FENO 23. She still has a chronic np cough, intermittent wheezing and dyspnea on exertion. Albuterol helps, she uses SABA once a day.

## 2021-06-27 NOTE — Assessment & Plan Note (Addendum)
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 Her symptom scale is relatively baseline for her compared to April 2022. O2 93% RA with ambulation. Discussed with Dr. Chase Caller. Treatment is to COMPLETELY stop smoking. Once she has quit would recommend repeating imaging, tentatively planned for November 2022. If unable to stop smoking next option would be bronchoscopy with lavage. She is aware of the above, plan follow-up in 3 months.

## 2021-06-28 ENCOUNTER — Telehealth: Payer: Self-pay

## 2021-06-28 DIAGNOSIS — F17200 Nicotine dependence, unspecified, uncomplicated: Secondary | ICD-10-CM

## 2021-06-30 ENCOUNTER — Other Ambulatory Visit: Payer: Self-pay | Admitting: Primary Care

## 2021-06-30 ENCOUNTER — Other Ambulatory Visit: Payer: Self-pay | Admitting: Cardiovascular Disease

## 2021-06-30 ENCOUNTER — Other Ambulatory Visit: Payer: Self-pay | Admitting: Family Medicine

## 2021-06-30 DIAGNOSIS — S92901S Unspecified fracture of right foot, sequela: Secondary | ICD-10-CM

## 2021-06-30 DIAGNOSIS — F411 Generalized anxiety disorder: Secondary | ICD-10-CM

## 2021-06-30 MED ORDER — VARENICLINE TARTRATE 0.5 MG PO TABS: 0.5000 mg | ORAL_TABLET | Freq: Every day | ORAL | 2 refills | Status: DC

## 2021-06-30 MED ORDER — NICOTINE 21 MG/24HR TD PT24: 21.0000 mg | MEDICATED_PATCH | Freq: Every day | TRANSDERMAL | 2 refills | Status: DC

## 2021-06-30 NOTE — Addendum Note (Signed)
Addended by: Pauletta Browns on: 06/30/2021 01:39 PM   Modules accepted: Orders

## 2021-06-30 NOTE — Telephone Encounter (Addendum)
Called patient to get an update on how Chantix was going; however, patient did not receive prescriptions from [facility] out patient pharmacy. Will send new rx to [facility] per patients request.    Patient is also requesting a prescription for nicotine patches as she is concerned that Chantix will cause her vomit again.

## 2021-07-01 ENCOUNTER — Encounter (HOSPITAL_BASED_OUTPATIENT_CLINIC_OR_DEPARTMENT_OTHER): Payer: Self-pay | Admitting: Oral Surgery

## 2021-07-01 ENCOUNTER — Other Ambulatory Visit: Payer: Self-pay

## 2021-07-01 DIAGNOSIS — R32 Unspecified urinary incontinence: Secondary | ICD-10-CM | POA: Diagnosis not present

## 2021-07-01 DIAGNOSIS — J449 Chronic obstructive pulmonary disease, unspecified: Secondary | ICD-10-CM | POA: Diagnosis not present

## 2021-07-06 ENCOUNTER — Other Ambulatory Visit (HOSPITAL_COMMUNITY): Payer: Self-pay

## 2021-07-06 NOTE — H&P (Addendum)
55 yo female   CC: No pain  Past Medical History:  Bronchitis, Seasonal Allergies, Asthma    Medications: Concerta, Claritin,  Albuterol, Zoloft, Trelegy Ellipta, Crestor, Priolsec, Neurontin, Buspar  Allergies:     Amoxicillin, Penicillin    Surgeries:   Tonsillectomy, Eye surgery, Tumor Removal              Physical Exam:                Oral: Large occlusal decay #19. Slight percussion tenderness # 19. Tooth #S missing distal tooth and root.  No purulence, edema, fluctuance, trismus. Oral cancer screening negative. Pharynx clear. No lymphadenopathy.  Cor: RRR no murmur  Lungs: Mild wheezing Bilaterally  Panorex: Occlusal decay #19.  Tooth #S missing distal tooth and root.  Assessment: ASA 1. Non-restorable  19 and S.             Plan: Continue Albuterol, Trelegy Ellipta, d/c smoking pre-op.  Extraction Teeth # 19, S.  Hospital Day surgery.                 Rx: n               Risks and complications explained. Questions answered.   Gae Bon, DMD

## 2021-07-11 ENCOUNTER — Ambulatory Visit (HOSPITAL_BASED_OUTPATIENT_CLINIC_OR_DEPARTMENT_OTHER): Payer: Medicaid Other | Admitting: Certified Registered"

## 2021-07-11 ENCOUNTER — Ambulatory Visit (HOSPITAL_BASED_OUTPATIENT_CLINIC_OR_DEPARTMENT_OTHER)
Admission: RE | Admit: 2021-07-11 | Discharge: 2021-07-11 | Disposition: A | Discharge status: Home / Self Care | Payer: Medicaid Other | Attending: Oral Surgery | Admitting: Oral Surgery

## 2021-07-11 ENCOUNTER — Other Ambulatory Visit: Payer: Self-pay

## 2021-07-11 ENCOUNTER — Encounter (HOSPITAL_BASED_OUTPATIENT_CLINIC_OR_DEPARTMENT_OTHER)
Admission: RE | Disposition: A | Discharge status: Home / Self Care | Payer: Self-pay | Source: Home / Self Care | Attending: Oral Surgery

## 2021-07-11 ENCOUNTER — Encounter (HOSPITAL_BASED_OUTPATIENT_CLINIC_OR_DEPARTMENT_OTHER): Payer: Self-pay | Admitting: Oral Surgery

## 2021-07-11 DIAGNOSIS — K0889 Other specified disorders of teeth and supporting structures: Secondary | ICD-10-CM | POA: Diagnosis not present

## 2021-07-11 DIAGNOSIS — F17200 Nicotine dependence, unspecified, uncomplicated: Secondary | ICD-10-CM | POA: Diagnosis not present

## 2021-07-11 DIAGNOSIS — Z880 Allergy status to penicillin: Secondary | ICD-10-CM | POA: Insufficient documentation

## 2021-07-11 DIAGNOSIS — J449 Chronic obstructive pulmonary disease, unspecified: Secondary | ICD-10-CM | POA: Diagnosis not present

## 2021-07-11 DIAGNOSIS — Z7951 Long term (current) use of inhaled steroids: Secondary | ICD-10-CM | POA: Insufficient documentation

## 2021-07-11 DIAGNOSIS — Z79899 Other long term (current) drug therapy: Secondary | ICD-10-CM | POA: Diagnosis not present

## 2021-07-11 DIAGNOSIS — K029 Dental caries, unspecified: Secondary | ICD-10-CM | POA: Diagnosis not present

## 2021-07-11 DIAGNOSIS — E7800 Pure hypercholesterolemia, unspecified: Secondary | ICD-10-CM | POA: Diagnosis not present

## 2021-07-11 HISTORY — PX: TOOTH EXTRACTION: SHX859

## 2021-07-11 SURGERY — DENTAL RESTORATION/EXTRACTIONS
Anesthesia: General | Site: Mouth

## 2021-07-11 MED ORDER — AMOXICILLIN 500 MG PO CAPS: 500.0000 mg | ORAL_CAPSULE | Freq: Three times a day (TID) | ORAL | 0 refills | Status: DC

## 2021-07-11 MED ORDER — MIDAZOLAM HCL 2 MG/2ML IJ SOLN
INTRAMUSCULAR | Status: AC
  Filled 2021-07-11: qty 2

## 2021-07-11 MED ORDER — HYDROMORPHONE HCL 1 MG/ML IJ SOLN
INTRAMUSCULAR | Status: AC
  Filled 2021-07-11: qty 0.5

## 2021-07-11 MED ORDER — ROCURONIUM BROMIDE 100 MG/10ML IV SOLN
INTRAVENOUS | Status: DC | PRN
  Administered 2021-07-11: 30 mg via INTRAVENOUS

## 2021-07-11 MED ORDER — LACTATED RINGERS IV SOLN: INTRAVENOUS | Status: DC

## 2021-07-11 MED ORDER — MEPERIDINE HCL 25 MG/ML IJ SOLN
INTRAMUSCULAR | Status: AC
  Filled 2021-07-11: qty 1

## 2021-07-11 MED ORDER — OXYCODONE HCL 5 MG PO TABS: 5.0000 mg | ORAL_TABLET | Freq: Once | ORAL | Status: DC | PRN

## 2021-07-11 MED ORDER — FENTANYL CITRATE (PF) 100 MCG/2ML IJ SOLN: 25.0000 ug | INTRAMUSCULAR | Status: DC | PRN

## 2021-07-11 MED ORDER — ONDANSETRON HCL 4 MG/2ML IJ SOLN
INTRAMUSCULAR | Status: AC
  Filled 2021-07-11: qty 2

## 2021-07-11 MED ORDER — HYDROCODONE-ACETAMINOPHEN 5-325 MG PO TABS
1.0000 | ORAL_TABLET | Freq: Once | ORAL | Status: DC | PRN
Start: 2021-07-11 — End: 2021-07-11

## 2021-07-11 MED ORDER — DEXAMETHASONE SODIUM PHOSPHATE 10 MG/ML IJ SOLN
INTRAMUSCULAR | Status: AC
  Filled 2021-07-11: qty 1

## 2021-07-11 MED ORDER — EPHEDRINE 5 MG/ML INJ
INTRAVENOUS | Status: AC
  Filled 2021-07-11: qty 5

## 2021-07-11 MED ORDER — MIDAZOLAM HCL 5 MG/5ML IJ SOLN
INTRAMUSCULAR | Status: DC | PRN
  Administered 2021-07-11: 2 mg via INTRAVENOUS

## 2021-07-11 MED ORDER — PROPOFOL 10 MG/ML IV BOLUS
INTRAVENOUS | Status: DC | PRN
Start: 2021-07-11 — End: 2021-07-11
  Administered 2021-07-11: 100 mg via INTRAVENOUS

## 2021-07-11 MED ORDER — EPHEDRINE SULFATE 50 MG/ML IJ SOLN
INTRAMUSCULAR | Status: DC | PRN
  Administered 2021-07-11 (×3): 5 mg via INTRAVENOUS
  Administered 2021-07-11: 10 mg via INTRAVENOUS

## 2021-07-11 MED ORDER — HYDROMORPHONE HCL 1 MG/ML IJ SOLN
0.5000 mg | Freq: Once | INTRAMUSCULAR | Status: AC | PRN
  Administered 2021-07-11: 0.5 mg via INTRAVENOUS

## 2021-07-11 MED ORDER — LIDOCAINE 2% (20 MG/ML) 5 ML SYRINGE
INTRAMUSCULAR | Status: AC
  Filled 2021-07-11: qty 5

## 2021-07-11 MED ORDER — LIDOCAINE 2% (20 MG/ML) 5 ML SYRINGE
INTRAMUSCULAR | Status: DC | PRN
  Administered 2021-07-11: 50 mg via INTRAVENOUS

## 2021-07-11 MED ORDER — PROMETHAZINE HCL 25 MG/ML IJ SOLN: 6.2500 mg | INTRAMUSCULAR | Status: DC | PRN

## 2021-07-11 MED ORDER — SUGAMMADEX SODIUM 200 MG/2ML IV SOLN
INTRAVENOUS | Status: DC | PRN
  Administered 2021-07-11: 100 mg via INTRAVENOUS

## 2021-07-11 MED ORDER — CEFAZOLIN SODIUM-DEXTROSE 2-4 GM/100ML-% IV SOLN
2.0000 g | INTRAVENOUS | Status: AC
  Administered 2021-07-11: 2 g via INTRAVENOUS

## 2021-07-11 MED ORDER — PROPOFOL 10 MG/ML IV BOLUS
INTRAVENOUS | Status: AC
  Filled 2021-07-11: qty 20

## 2021-07-11 MED ORDER — ONDANSETRON HCL 4 MG/2ML IJ SOLN
INTRAMUSCULAR | Status: DC | PRN
  Administered 2021-07-11: 4 mg via INTRAVENOUS

## 2021-07-11 MED ORDER — 0.9 % SODIUM CHLORIDE (POUR BTL) OPTIME
TOPICAL | Status: DC | PRN
  Administered 2021-07-11: 200 mL

## 2021-07-11 MED ORDER — LIDOCAINE-EPINEPHRINE 2 %-1:100000 IJ SOLN
INTRAMUSCULAR | Status: AC
  Filled 2021-07-11: qty 1

## 2021-07-11 MED ORDER — FENTANYL CITRATE (PF) 100 MCG/2ML IJ SOLN
INTRAMUSCULAR | Status: AC
  Filled 2021-07-11: qty 2

## 2021-07-11 MED ORDER — ROCURONIUM BROMIDE 10 MG/ML (PF) SYRINGE
PREFILLED_SYRINGE | INTRAVENOUS | Status: AC
  Filled 2021-07-11: qty 10

## 2021-07-11 MED ORDER — DEXAMETHASONE SODIUM PHOSPHATE 10 MG/ML IJ SOLN
INTRAMUSCULAR | Status: DC | PRN
  Administered 2021-07-11: 10 mg via INTRAVENOUS

## 2021-07-11 MED ORDER — OXYCODONE HCL 5 MG/5ML PO SOLN: 5.0000 mg | Freq: Once | ORAL | Status: DC | PRN

## 2021-07-11 MED ORDER — HYDROCODONE-ACETAMINOPHEN 10-325 MG PO TABS: 1.0000 | ORAL_TABLET | ORAL | 0 refills | Status: AC | PRN

## 2021-07-11 MED ORDER — MEPERIDINE HCL 25 MG/ML IJ SOLN
6.2500 mg | INTRAMUSCULAR | Status: DC | PRN
  Administered 2021-07-11 (×2): 12.5 mg via INTRAVENOUS

## 2021-07-11 MED ORDER — FENTANYL CITRATE (PF) 100 MCG/2ML IJ SOLN
INTRAMUSCULAR | Status: DC | PRN
  Administered 2021-07-11: 50 ug via INTRAVENOUS
  Administered 2021-07-11: 100 ug via INTRAVENOUS

## 2021-07-11 MED ORDER — LIDOCAINE-EPINEPHRINE 2 %-1:100000 IJ SOLN
INTRAMUSCULAR | Status: DC | PRN
  Administered 2021-07-11: 20 mL via INTRADERMAL

## 2021-07-11 MED ORDER — CEFAZOLIN SODIUM-DEXTROSE 2-4 GM/100ML-% IV SOLN
INTRAVENOUS | Status: AC
  Filled 2021-07-11: qty 100

## 2021-07-11 SURGICAL SUPPLY — 36 items
BLADE SURG 15 STRL LF DISP TIS (BLADE) ×1 IMPLANT
BLADE SURG 15 STRL SS (BLADE) ×2
BNDG COHESIVE 2X5 TAN ST LF (GAUZE/BANDAGES/DRESSINGS) IMPLANT
BNDG EYE OVAL (GAUZE/BANDAGES/DRESSINGS) IMPLANT
BUR CROSS CUT FISSURE 1.6 (BURR) ×2 IMPLANT
BUR EGG ELITE 4.0 (BURR) ×1 IMPLANT
CANISTER SUCT 1200ML W/VALVE (MISCELLANEOUS) ×2 IMPLANT
CATH ROBINSON RED A/P 10FR (CATHETERS) IMPLANT
COVER BACK TABLE 60X90IN (DRAPES) ×2 IMPLANT
COVER MAYO STAND STRL (DRAPES) ×2 IMPLANT
DRAPE U-SHAPE 76X120 STRL (DRAPES) ×2 IMPLANT
GAUZE PACKING FOLDED 2  STR (GAUZE/BANDAGES/DRESSINGS) ×2
GAUZE PACKING FOLDED 2 STR (GAUZE/BANDAGES/DRESSINGS) ×1 IMPLANT
GAUZE PACKING IODOFORM 1/4X15 (PACKING) IMPLANT
GLOVE SURG ENC MOIS LTX SZ6.5 (GLOVE) ×2 IMPLANT
GLOVE SURG ENC MOIS LTX SZ8 (GLOVE) ×2 IMPLANT
GLOVE SURG UNDER POLY LF SZ6.5 (GLOVE) ×2 IMPLANT
GOWN STRL REUS W/ TWL LRG LVL3 (GOWN DISPOSABLE) ×1 IMPLANT
GOWN STRL REUS W/ TWL XL LVL3 (GOWN DISPOSABLE) ×1 IMPLANT
GOWN STRL REUS W/TWL LRG LVL3 (GOWN DISPOSABLE) ×2
GOWN STRL REUS W/TWL XL LVL3 (GOWN DISPOSABLE) ×2
IV NS 500ML (IV SOLUTION) ×2
IV NS 500ML BAXH (IV SOLUTION) ×1 IMPLANT
NEEDLE HYPO 22GX1.5 SAFETY (NEEDLE) ×2 IMPLANT
NS IRRIG 1000ML POUR BTL (IV SOLUTION) ×2 IMPLANT
PACK BASIN DAY SURGERY FS (CUSTOM PROCEDURE TRAY) ×2 IMPLANT
SLEEVE SCD COMPRESS KNEE MED (STOCKING) ×1 IMPLANT
SPONGE SURGIFOAM ABS GEL 12-7 (HEMOSTASIS) IMPLANT
SUT CHROMIC 3 0 PS 2 (SUTURE) ×2 IMPLANT
SYR BULB EAR ULCER 3OZ GRN STR (SYRINGE) ×2 IMPLANT
SYR CONTROL 10ML LL (SYRINGE) ×2 IMPLANT
TOOTHBRUSH ADULT (PERSONAL CARE ITEMS) IMPLANT
TOWEL GREEN STERILE FF (TOWEL DISPOSABLE) ×2 IMPLANT
TUBE CONNECTING 20X1/4 (TUBING) ×2 IMPLANT
TUBING IRRIGATION (MISCELLANEOUS) ×2 IMPLANT
YANKAUER SUCT BULB TIP NO VENT (SUCTIONS) ×2 IMPLANT

## 2021-07-11 NOTE — Op Note (Signed)
NAME: [name], [name] MEDICAL RECORD NO: [id number] ACCOUNT NO: [id number] DATE OF BIRTH: 1966-08-20 FACILITY: [facility] LOCATION: [facility] PHYSICIAN: Gae Bon, DDS  Operative Report   DATE OF PROCEDURE: 07/11/2021  PREOPERATIVE DIAGNOSIS:  Nonrestorable teeth numbers 6, 7, 8, 9, 10, 11, 13, 19, 20, 21, 22, 23, 26, 27, 28, 29 secondary to dental caries.  POSTOPERATIVE DIAGNOSIS:  Nonrestorable teeth numbers 6, 7, 8, 9, 10, 11, 13, 19, 20, 21, 22, 23, 26, 27, 28, 29 secondary to dental caries.  PROCEDURE:  Extraction teeth numbers 6, 7, 8, 9, 10, 11, 13, 19, 20, 21, 22, 23, 26, 27, 28, 29, alveoplasty right and left maxilla and mandible.   SURGEON:  Diona Browner, DDS  ANESTHESIA:  General, nasal intubation.  Dr. Lissa Hoard, attending.  PROCEDURE IN DETAIL:  The patient was taken to the operating room and placed on the table in supine position.  General anesthesia was administered and a nasal endotracheal tube was placed and secured.  The eyes were protected and the patient was draped  for surgery.  Timeout was performed.  The posterior pharynx was suctioned and a throat pack was placed.  2% lidocaine 1:100,000 epinephrine was infiltrated in an inferior alveolar block on the right and left sides and in buccal infiltration of the  anterior mandible, then anesthesia was administered in the maxilla both buccally and palatally around the teeth to be removed.  Bite block was placed on the right side of the mouth, a sweetheart retractor was used to retract the tongue.  A #15 blade was  used to make an incision in the alveolus beginning in the area of tooth #18 on the alveolar crest.  It was carried forward to tooth #19 and then an incision was made in the buccal and in the lingual sulcus up until tooth #23 was encountered.  The  periosteum was reflected from around these teeth.  The teeth were elevated with a 301 elevator.  The teeth were removed with dental forceps except for tooth #22, which  fractured upon attempted removal.  The Stryker handpiece was then used with a fissure  bur to remove circumferential bone around the root and then the root was elevated and removed with dental forceps and the sockets were curetted.  The periosteum was reflected to expose the alveolar crest.  The alveolar crest was irregular in contour  owing to some mild hyperplasia of the superior rim of the alveolus causing an undercut underneath this rim.  The rim needed to be trimmed which is called alveoplasty to smooth the bone.  Also, bony rough areas were smoothed with the egg bur and then the  bone file and then the area of the left mandible was closed with 3-0 chromic.  Then, the 15 blade was used to make an incision around tooth #13 and then in the maxilla around teeth numbers 11, 10, 9, 8, 7 and 6.  The periosteum was reflected from around  these teeth.  The teeth were elevated.  Teeth numbers 13, 11, 10, 9, 8 and 7 were removed with a forceps.  Tooth #6 fractured upon attempted removal, necessitating removal of circumferential bone around the remaining root.  Then, the tooth was elevated  and removed with dental forceps.  Then, the tissue was further reflected to expose the alveolar crest, which had undercuts and irregular bone contour. Alveoplasty was required and this was performed using the egg bur followed by the bone file, then the  maxilla was closed with 3-0 chromic.  The bite block and sweetheart retractor were repositioned to the other side of the mouth.  The 15 blade was used to make an incision around teeth numbers 27, 28, 29.  The periosteum was reflected.  The teeth were  elevated.  Teeth numbers 26, 27 and 28 were elevated, teeth numbers 26 and 28 were removed with dental forceps.  Tooth #27 fractured upon attempted removal.  The Stryker handpiece was then used to remove circumferential bone from around this tooth.   Then, the tooth was elevated and removed with the Ash forceps.  The sockets were  curetted.  The periosteum was reflected.  There were undercut areas and irregular bone contour, which needed to be smoothed with alveoplasty, the egg bur and the bone file  were used for this procedure.  Then, the area was irrigated and closed with 3-0 chromic.  Then, additional local anesthetic was administered to the maxilla and mandible.  The oral cavity was irrigated and suctioned.  The throat pack was removed.  The  patient was left under the care of anesthesia for extubation and transport to recovery room with plans for discharge home through day surgery.  ESTIMATED BLOOD LOSS:  Minimum.  COMPLICATIONS:  None.  SPECIMENS:  None.   SHW D: 07/11/2021 4:36:57 pm T: 07/11/2021 11:31:00 pm  JOB: [id number]

## 2021-07-11 NOTE — Anesthesia Preprocedure Evaluation (Addendum)
Anesthesia Evaluation  Patient identified by MRN, date of birth, ID band Patient awake    Reviewed: Allergy & Precautions, NPO status , Patient's Chart, lab work & pertinent test results  Airway Mallampati: II  TM Distance: >3 FB Neck ROM: Full    Dental  (+) Dental Advisory Given, Missing, Poor Dentition, Chipped   Pulmonary pneumonia, COPD, Current Smoker,    Pulmonary exam normal breath sounds clear to auscultation       Cardiovascular + angina + Past MI  Normal cardiovascular exam+ Valvular Problems/Murmurs MVP  Rhythm:Regular Rate:Normal  Ech o7/2022 1. Left ventricular ejection fraction, by estimation, is 60 to 65%. The left ventricle has normal function. The left ventricle has no regional wall motion abnormalities. Left ventricular diastolic parameters were normal.  2. Right ventricular systolic function is normal. The right ventricular size is normal. There is normal pulmonary artery systolic pressure.  3. Mild bileaflet prolapse and myxomatious changes . The mitral valve is myxomatous. Trivial mitral valve regurgitation. No evidence of mitral stenosis.  4. The aortic valve is normal in structure. Aortic valve regurgitation is not visualized. No aortic stenosis is present.  5. The inferior vena cava is normal in size with greater than 50% respiratory variability, suggesting right atrial pressure of 3 mmHg.    Neuro/Psych  Headaches, PSYCHIATRIC DISORDERS Anxiety Depression    GI/Hepatic negative GI ROS, Neg liver ROS,   Endo/Other  negative endocrine ROS  Renal/GU Renal disease     Musculoskeletal  (+) Arthritis ,   Abdominal   Peds  Hematology  (+) Blood dyscrasia, anemia ,   Anesthesia Other Findings   Reproductive/Obstetrics                            Anesthesia Physical Anesthesia Plan  ASA: 3  Anesthesia Plan: General   Post-op Pain Management:    Induction:  Intravenous  PONV Risk Score and Plan: 3 and Ondansetron, Dexamethasone, Midazolam and Treatment may vary due to age or medical condition  Airway Management Planned: Nasal ETT  Additional Equipment: None  Intra-op Plan:   Post-operative Plan: Extubation in OR  Informed Consent: I have reviewed the patients History and Physical, chart, labs and discussed the procedure including the risks, benefits and alternatives for the proposed anesthesia with the patient or authorized representative who has indicated his/her understanding and acceptance.     Dental advisory given  Plan Discussed with: CRNA  Anesthesia Plan Comments:         Anesthesia Quick Evaluation

## 2021-07-11 NOTE — Op Note (Signed)
07/11/2021  4:31 PM  PATIENT:  [name]  55 y.o. female  PRE-OPERATIVE DIAGNOSIS:  NON RESTORABLE TEETH # 6, 7, 8, 9, 10, 11, 13, 19, 20, 21, 22, 23, 26, 27, 28,  SECONDARY TO DENTAL CARIES  POST-OPERATIVE DIAGNOSIS:  SAME  PROCEDURE:  Procedure(s): DENTAL EXTRACTIONS  TEETH # 6, 7, 8, 9, 10, 11, 13, 19, 20, 21, 22, 23, 26, 27, 28, ; ALVEOLOPLASTY RIGHT AND LEFT MAXILLA AND MANDIBLE  SURGEON:  Surgeon(s): Diona Browner, DMD  ANESTHESIA:   local and general  EBL:  minimal  DRAINS: none   SPECIMEN:  No Specimen  COUNTS:  YES  PLAN OF CARE: Discharge to home after PACU  PATIENT DISPOSITION:  PACU - hemodynamically stable.   PROCEDURE DETAILS: Dictation # [id number]  Gae Bon, DMD 07/11/2021 4:31 PM

## 2021-07-11 NOTE — Discharge Instructions (Signed)

## 2021-07-11 NOTE — Anesthesia Postprocedure Evaluation (Signed)
Anesthesia Post Note  Patient: [name]  Procedure(s) Performed: DENTAL RESTORATION / EXTRACTIONS x 13 (Mouth)     Patient location during evaluation: PACU Anesthesia Type: General Level of consciousness: sedated and patient cooperative Pain management: pain level controlled Vital Signs Assessment: post-procedure vital signs reviewed and stable Respiratory status: spontaneous breathing Cardiovascular status: stable Anesthetic complications: no   No notable events documented.  Last Vitals:  Vitals:   07/11/21 1649 07/11/21 1700  BP:  119/68  Pulse: 94 95  Resp: (!) 23 19  Temp:    SpO2: 95% 91%    Last Pain:  Vitals:   07/11/21 1700  TempSrc:   PainSc: [place]

## 2021-07-11 NOTE — Anesthesia Procedure Notes (Signed)
Procedure Name: Intubation Date/Time: 07/11/2021 3:44 PM Performed by: Lavonia Dana, CRNA Pre-anesthesia Checklist: Patient identified, Emergency Drugs available, Suction available and Patient being monitored Patient Re-evaluated:Patient Re-evaluated prior to induction Oxygen Delivery Method: Circle system utilized Preoxygenation: Pre-oxygenation with 100% oxygen Induction Type: IV induction Ventilation: Mask ventilation without difficulty Laryngoscope Size: Mac and 3 Grade View: Grade I Nasal Tubes: Right, Nasal Rae and Magill forceps- large, utilized Tube size: 6.5 mm Number of attempts: 1 Placement Confirmation: ETT inserted through vocal cords under direct vision, positive ETCO2 and breath sounds checked- equal and bilateral Secured at: 25 (At right nare) cm Tube secured with: Tape Dental Injury: Teeth and Oropharynx as per pre-operative assessment

## 2021-07-11 NOTE — H&P (Signed)
H&P documentation  -History and Physical Reviewed  -Patient has been re-examined  -No change in the plan of care  [name]  

## 2021-07-11 NOTE — Transfer of Care (Signed)
Immediate Anesthesia Transfer of Care Note  Patient: [name]  Procedure(s) Performed: DENTAL RESTORATION / EXTRACTIONS x 13 (Mouth)  Patient Location: PACU  Anesthesia Type:General  Level of Consciousness: awake, alert  and oriented  Airway & Oxygen Therapy: Patient Spontanous Breathing and Patient connected to face mask oxygen  Post-op Assessment: Report given to RN and Post -op Vital signs reviewed and stable  Post vital signs: Reviewed and stable  Last Vitals:  Vitals Value Taken Time  BP 116/55 07/11/21 1638  Temp    Pulse 101 07/11/21 1640  Resp 25 07/11/21 1640  SpO2 99 % 07/11/21 1640  Vitals shown include unvalidated device data.  Last Pain:  Vitals:   07/11/21 1345  TempSrc: Oral  PainSc: 8          Complications: No notable events documented.

## 2021-07-12 ENCOUNTER — Encounter (HOSPITAL_BASED_OUTPATIENT_CLINIC_OR_DEPARTMENT_OTHER): Payer: Self-pay | Admitting: Oral Surgery

## 2021-07-12 ENCOUNTER — Ambulatory Visit: Payer: Medicaid Other

## 2021-07-26 ENCOUNTER — Telehealth: Payer: Self-pay

## 2021-07-26 NOTE — Telephone Encounter (Signed)
Patient was contacted for a smoking-cessation telephone follow-up. Left voicemail for patient to call me back when available.

## 2021-07-30 DIAGNOSIS — R32 Unspecified urinary incontinence: Secondary | ICD-10-CM | POA: Diagnosis not present

## 2021-07-30 DIAGNOSIS — J449 Chronic obstructive pulmonary disease, unspecified: Secondary | ICD-10-CM | POA: Diagnosis not present

## 2021-08-02 ENCOUNTER — Other Ambulatory Visit: Payer: Self-pay | Admitting: Family Medicine

## 2021-08-02 DIAGNOSIS — S92901S Unspecified fracture of right foot, sequela: Secondary | ICD-10-CM

## 2021-08-04 NOTE — Telephone Encounter (Signed)
Patient was contacted for smoking cessation follow-up. Patient did not answer and a voicemail was left. Will call patient once more before asking she be re-referred.   Pauletta Browns, Pharm.D. PGY-1 Pharmacy Resident [phone] 08/04/2021 4:51 PM

## 2021-08-13 ENCOUNTER — Other Ambulatory Visit: Payer: Self-pay | Admitting: Primary Care

## 2021-08-22 ENCOUNTER — Telehealth: Payer: Self-pay

## 2021-08-22 NOTE — Telephone Encounter (Signed)
Patient unable to be reached after 3 call attempts. Referring pulmonologist will be notified to re-refer the patient if they are still interested in the smoking cessation telephone service.  Joseph Art, Pharm.D. PGY-1 Pharmacy Resident 08/22/2021 3:09 PM

## 2021-08-25 ENCOUNTER — Other Ambulatory Visit: Payer: Self-pay | Admitting: Family Medicine

## 2021-08-25 DIAGNOSIS — F419 Anxiety disorder, unspecified: Secondary | ICD-10-CM

## 2021-08-29 ENCOUNTER — Other Ambulatory Visit: Payer: Self-pay | Admitting: *Deleted

## 2021-08-29 DIAGNOSIS — S92901S Unspecified fracture of right foot, sequela: Secondary | ICD-10-CM

## 2021-08-30 DIAGNOSIS — J449 Chronic obstructive pulmonary disease, unspecified: Secondary | ICD-10-CM | POA: Diagnosis not present

## 2021-08-30 DIAGNOSIS — R32 Unspecified urinary incontinence: Secondary | ICD-10-CM | POA: Diagnosis not present

## 2021-08-30 MED ORDER — OMEPRAZOLE 20 MG PO CPDR: DELAYED_RELEASE_CAPSULE | ORAL | 0 refills | Status: DC

## 2021-09-02 ENCOUNTER — Ambulatory Visit: Payer: Medicaid Other | Admitting: Neurology

## 2021-09-02 ENCOUNTER — Ambulatory Visit: Payer: Medicaid Other | Admitting: Cardiovascular Disease

## 2021-09-07 NOTE — Telephone Encounter (Signed)
Discussed with Dr. Chase Caller, see office note

## 2021-09-13 ENCOUNTER — Other Ambulatory Visit: Payer: Self-pay | Admitting: Family Medicine

## 2021-09-13 DIAGNOSIS — S92901S Unspecified fracture of right foot, sequela: Secondary | ICD-10-CM

## 2021-09-14 ENCOUNTER — Other Ambulatory Visit: Payer: Self-pay | Admitting: Primary Care

## 2021-09-14 DIAGNOSIS — R053 Chronic cough: Secondary | ICD-10-CM

## 2021-09-14 DIAGNOSIS — J449 Chronic obstructive pulmonary disease, unspecified: Secondary | ICD-10-CM

## 2021-09-30 DIAGNOSIS — R32 Unspecified urinary incontinence: Secondary | ICD-10-CM | POA: Diagnosis not present

## 2021-09-30 DIAGNOSIS — J449 Chronic obstructive pulmonary disease, unspecified: Secondary | ICD-10-CM | POA: Diagnosis not present

## 2021-10-31 DIAGNOSIS — R32 Unspecified urinary incontinence: Secondary | ICD-10-CM | POA: Diagnosis not present

## 2021-10-31 DIAGNOSIS — J449 Chronic obstructive pulmonary disease, unspecified: Secondary | ICD-10-CM | POA: Diagnosis not present

## 2021-11-02 ENCOUNTER — Other Ambulatory Visit: Payer: Self-pay | Admitting: Family Medicine

## 2021-11-02 ENCOUNTER — Other Ambulatory Visit: Payer: Self-pay | Admitting: Radiology

## 2021-11-02 DIAGNOSIS — Z1231 Encounter for screening mammogram for malignant neoplasm of breast: Secondary | ICD-10-CM

## 2021-11-03 NOTE — Progress Notes (Signed)
Follow-up Visit   Date: 11/04/21   KARESHA TRZCINSKI MRN: [id number] DOB: [birth year]   Interim History: [name] is a 56 y.o. right-handed Caucasian female with migraine, chronic low back, COPD, and depression returning to the clinic with new complaints of migraines.  The patient was accompanied to the clinic by self.  History of present illness: She was last seen in the office in 2020 for generalized paresthesias.  NCS/EMG of the left arm and leg was normal.   Starting around 2016, she began having burning sensation over the hands and feet which was intermittent.  Over the past 6 months, she reports that symptoms have intensified and involve the entirety of her arms, thighs, lower legs, and feet.  Symptoms occur in spells throughout the day, lasting several hours.  She has not identified any triggers.  She does report finding tick bites on her over the past few months.  She also complains of a multitude of other symptoms such as indentation in the scalp, chronic low back pain, and numbness of the head.  She denies any problems with balance or walking.  She walks unassisted.  Previous evaluation has included MRI lumbar spine most recently in June 2020 which was did not show any nerve impingement.  Laboratory testing has included hemoglobin A1c, TSH, vitamin B12, HIV and RPR which is normal.   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  She is trying to get disability.   UPDATE 11/04/2021:  She is here for new complaints of headaches. Over the past several months, she complains of an "explosion in her head" which occurs about 3 times per month, which lasts a few minutes.  Upon further questioning, she"hears" a loud noise.  She also has burning sensation down the back of her head and neck.  She has migraines since the age of 87. She has bitemporal and frontal throbbing pain, which lasts typically 1 day, sometimes up to 8 days.  She has associated photophobia,  phonophobia, nausea, and vomiting.   She has previously tried imitrex injection and sublingual and maxalt.  She had relief with imitrex injections but was very tired afterwards. She does not get relief with NSAIDs.  She has not taken anything for migraines in many years.    She has chronic nausea and prefers non-oral medications.   Medications:  Current Outpatient Medications on File Prior to Visit  Medication Sig Dispense Refill   albuterol (PROVENTIL) (2.5 MG/3ML) 0.083% nebulizer solution Take 3 mLs (2.5 mg total) by nebulization every 6 (six) hours as needed for wheezing or shortness of breath. 75 mL 0   aspirin 81 MG chewable tablet Chew 81 mg by mouth daily.     busPIRone (BUSPAR) 10 MG tablet TAKE 1 TABLET BY MOUTH 2 TIMES DAILY AS NEEDED. 180 tablet 3   EYSUVIS 0.25 % SUSP Place 1 drop into both eyes 3 (three) times daily.     Fluticasone-Umeclidin-Vilant (TRELEGY ELLIPTA) 200-62.5-25 MCG/INH AEPB Inhale 1 puff into the lungs daily. 3 each 3   gabapentin (NEURONTIN) 300 MG capsule TAKE 1 CAPSULE BY MOUTH TWICE A DAY 90 capsule 3   meloxicam (MOBIC) 15 MG tablet Take 1 tablet (15 mg total) by mouth daily. 15 tablet 0   MITIGARE 0.6 MG CAPS TAKE 0.6 MG BY MOUTH IN THE MORNING AND AT BEDTIME. 180 capsule 2   nicotine (NICODERM CQ) 21 mg/24hr patch Place 1 patch (21 mg total) onto the skin daily.  28 patch 2   nitroGLYCERIN (NITROSTAT) 0.4 MG SL tablet Place 1 tablet (0.4 mg total) under the tongue every 5 (five) minutes as needed for chest pain. 25 tablet 1   omeprazole (PRILOSEC) 20 MG capsule TAKE 1 CAPSULE BY MOUTH EVERY DAY 90 capsule 1   ondansetron (ZOFRAN-ODT) 4 MG disintegrating tablet TAKE 1 TABLET BY MOUTH EVERY 8 HOURS AS NEEDED FOR NAUSEA AND VOMITING 30 tablet 0   Pilocarpine HCl (VUITY) 1.25 % SOLN Place 1 drop into both eyes daily.     PROAIR HFA 108 (90 Base) MCG/ACT inhaler INHALE 1-2 PUFFS BY MOUTH EVERY 6 HOURS AS NEEDED FOR WHEEZE OR SHORTNESS OF BREATH 8.5 each 2    RESTASIS 0.05 % ophthalmic emulsion Place 1 drop into both eyes 2 (two) times daily.     rosuvastatin (CRESTOR) 40 MG tablet TAKE 1 TABLET BY MOUTH EVERY DAY 90 tablet 1   varenicline (CHANTIX) 0.5 MG tablet Take 1 tablet (0.5 mg total) by mouth daily. 30 tablet 2   varenicline (CHANTIX) 0.5 MG tablet Take 1 tablet (0.5 mg total) by mouth daily. 30 tablet 2   sertraline (ZOLOFT) 100 MG tablet TAKE 1 TABLET BY MOUTH EVERY DAY (Patient not taking: Reported on 11/04/2021) 90 tablet 2   No current facility-administered medications on file prior to visit.    Allergies:  Allergies  Allergen Reactions   Dextromethorphan Other (See Comments)    keeps awake, legs constantly moving    Doxylamine Other (See Comments)    keeps awake, legs constantly moving    Pseudoeph-Doxylamine-Dm-Apap Other (See Comments)    REACTION: keeps awake, legs constantly moving  NYQUIL   Pseudoephedrine Hcl Other (See Comments)    keeps awake, legs constantly moving     Vital Signs:  BP (!) 104/53    Pulse 86    Ht 5' (1.524 m)    Wt 104 lb (47.2 kg)    SpO2 97%    BMI 20.31 kg/m   Neurological Exam: MENTAL STATUS including orientation to time, place, person, recent and remote memory, attention span and concentration, language, and fund of knowledge is normal.  Speech is not dysarthric.  CRANIAL NERVES:  No visual field defects. Normal fundoscopic exam.  Pupils equal round and reactive to light.  Normal conjugate, extra-ocular eye movements in all directions of gaze.  No ptosis.  Face is symmetric. Palate elevates symmetrically.  Tongue is midline.  MOTOR:  Motor strength is 5/5 in all extremities.  No atrophy, fasciculations or abnormal movements.  No pronator drift.  Tone is normal.    MSRs:  Reflexes are 2+/4 throughout.  SENSORY:  Intact to vibration throughout.  COORDINATION/GAIT:  Normal finger-to- nose-finger.  Intact rapid alternating movements bilaterally.  Gait narrow based and stable.   Data: CTA  neck 10/25/2011:  Negative CTA neck for stenosis or dissection. Possible acute left  maxillary sinusitis.   CTA head 10/25/2011:  Negative  IMPRESSION/PLAN: Episodic migraine without aura  - Start imitrex 6mg  injections as needed for severe migraine.  Limit to twice per week  2.   Dizziness, exploding head syndrome   - MRI/A head  - Reassurance provided  Return to clinic in 9 months.   Thank you for allowing me to participate in patient's care.  If I can answer any additional questions, I would be pleased to do so.    Sincerely,    Jamir Rone K. Posey Pronto, DO

## 2021-11-04 ENCOUNTER — Other Ambulatory Visit: Payer: Self-pay

## 2021-11-04 ENCOUNTER — Encounter: Payer: Self-pay | Admitting: Neurology

## 2021-11-04 ENCOUNTER — Ambulatory Visit: Payer: Medicaid Other | Admitting: Neurology

## 2021-11-04 VITALS — BP 104/53 | HR 86 | Ht 60.0 in | Wt 104.0 lb

## 2021-11-04 DIAGNOSIS — G43009 Migraine without aura, not intractable, without status migrainosus: Secondary | ICD-10-CM

## 2021-11-04 DIAGNOSIS — R42 Dizziness and giddiness: Secondary | ICD-10-CM | POA: Diagnosis not present

## 2021-11-04 DIAGNOSIS — G4750 Parasomnia, unspecified: Secondary | ICD-10-CM | POA: Diagnosis not present

## 2021-11-04 MED ORDER — SUMATRIPTAN SUCCINATE 6 MG/0.5ML ~~LOC~~ SOLN: 6.0000 mg | SUBCUTANEOUS | 8 refills | Status: AC | PRN

## 2021-11-04 NOTE — Patient Instructions (Addendum)
MRI brain brain and MRA head  Start imitrex injection for severe migraine. Limit to twice per week.  Return to clinic in 9 months

## 2021-11-10 ENCOUNTER — Encounter: Payer: Medicaid Other | Admitting: Family Medicine

## 2021-11-19 ENCOUNTER — Ambulatory Visit
Admission: RE | Admit: 2021-11-19 | Discharge: 2021-11-19 | Disposition: A | Discharge status: Home / Self Care | Payer: Medicaid Other | Source: Ambulatory Visit | Attending: Neurology | Admitting: Neurology

## 2021-11-19 ENCOUNTER — Other Ambulatory Visit: Payer: Self-pay

## 2021-11-19 DIAGNOSIS — R519 Headache, unspecified: Secondary | ICD-10-CM | POA: Diagnosis not present

## 2021-11-19 DIAGNOSIS — G43009 Migraine without aura, not intractable, without status migrainosus: Secondary | ICD-10-CM

## 2021-11-20 ENCOUNTER — Encounter: Payer: Self-pay | Admitting: Cardiovascular Disease

## 2021-11-20 NOTE — Progress Notes (Signed)
Pt was sick and needed to reschedule This encounter was created in error - please disregard. This encounter was created in error - please disregard.

## 2021-11-21 ENCOUNTER — Encounter: Payer: Self-pay | Admitting: Neurology

## 2021-11-22 ENCOUNTER — Ambulatory Visit
Admission: RE | Admit: 2021-11-22 | Discharge: 2021-11-22 | Disposition: A | Discharge status: Home / Self Care | Payer: Medicaid Other | Source: Ambulatory Visit

## 2021-11-22 ENCOUNTER — Encounter: Payer: Medicaid Other | Admitting: Student

## 2021-11-22 ENCOUNTER — Encounter: Payer: Medicaid Other | Admitting: Family Medicine

## 2021-11-22 DIAGNOSIS — Z1231 Encounter for screening mammogram for malignant neoplasm of breast: Secondary | ICD-10-CM | POA: Diagnosis not present

## 2021-11-23 ENCOUNTER — Telehealth: Payer: Self-pay | Admitting: Cardiovascular Disease

## 2021-11-23 NOTE — Telephone Encounter (Signed)
Pt reaching out to see if she is able to change 1/27 visit to a virtual visit... please advise

## 2021-11-23 NOTE — Telephone Encounter (Signed)
Called patient she is feeling sick. Informed patient that we can reschedule appointment. Patient stated she would rather just do a mychart visit. Since patient is not due for EKG and her cath was recent will change appointment to [facility] visit. Patient verbalized understanding.

## 2021-11-24 NOTE — Telephone Encounter (Signed)
Called and spoke with patient to reschedule follow-up visit for in-person office visit vs. Mychart video visit per Dr. Acie Fredrickson. Patient agreed to office visit on Tuesday 01/03/22 at 08:40AM.

## 2021-11-25 ENCOUNTER — Encounter: Payer: Medicaid Other | Admitting: Cardiovascular Disease

## 2021-12-08 ENCOUNTER — Ambulatory Visit: Payer: Medicaid Other | Admitting: Internal Medicine

## 2022-01-03 ENCOUNTER — Ambulatory Visit: Payer: Medicaid Other | Admitting: Cardiovascular Disease

## 2022-01-05 NOTE — Progress Notes (Signed)
Erroneous encounter

## 2022-01-09 ENCOUNTER — Encounter: Payer: Medicaid Other | Admitting: Family

## 2022-01-09 DIAGNOSIS — Z7689 Persons encountering health services in other specified circumstances: Secondary | ICD-10-CM

## 2022-01-18 DIAGNOSIS — R32 Unspecified urinary incontinence: Secondary | ICD-10-CM | POA: Diagnosis not present

## 2022-01-18 DIAGNOSIS — J449 Chronic obstructive pulmonary disease, unspecified: Secondary | ICD-10-CM | POA: Diagnosis not present

## 2022-01-21 ENCOUNTER — Other Ambulatory Visit: Payer: Self-pay | Admitting: Family Medicine

## 2022-01-21 DIAGNOSIS — R202 Paresthesia of skin: Secondary | ICD-10-CM

## 2022-02-18 DIAGNOSIS — R32 Unspecified urinary incontinence: Secondary | ICD-10-CM | POA: Diagnosis not present

## 2022-02-18 DIAGNOSIS — J449 Chronic obstructive pulmonary disease, unspecified: Secondary | ICD-10-CM | POA: Diagnosis not present

## 2022-02-19 ENCOUNTER — Telehealth: Payer: Self-pay | Admitting: Internal Medicine

## 2022-02-19 DIAGNOSIS — J849 Interstitial pulmonary disease, unspecified: Secondary | ICD-10-CM

## 2022-02-19 NOTE — Telephone Encounter (Signed)
Did not dod 3 month followup in Nov 2022 after seeing Beth in Aug 2022 for smokers ILD ? ? ? ?  Latest Ref Rng & Units 06/09/2021  ?  9:05 AM  ?PFT Results  ?FVC-Pre L 1.61    ?FVC-Predicted Pre % 52    ?FVC-Post L 1.55    ?FVC-Predicted Post % 50    ?Pre FEV1/FVC % % 81    ?Post FEV1/FCV % % 87    ?FEV1-Pre L 1.30    ?FEV1-Predicted Pre % 53    ?FEV1-Post L 1.34    ?DLCO uncorrected ml/min/mmHg 8.09    ?DLCO UNC% % 43    ?DLCO corrected ml/min/mmHg 8.09    ?DLCO COR %Predicted % 43    ?DLVA Predicted % 83    ?TLC L 3.52    ?TLC % Predicted % 76    ?RV % Predicted % 110    ? ? ?Plan ? - do spiro/dlco ?- do HRCT supine and prine - May 2023 o rJune 2023 ?- ROV after above  1st avail - 30 min ?

## 2022-02-21 NOTE — Telephone Encounter (Signed)
THere is not a open order to schedule we will need one to set up ?

## 2022-02-21 NOTE — Telephone Encounter (Signed)
Unable to leave a voicemail for patient, sent mychart message informing patient to call the office to schedule PFT before her appointment on 03/10/2022. Routing to [facility] pool in regards to CT scan.  ?

## 2022-02-22 NOTE — Telephone Encounter (Signed)
Order has been placed for CT scan.  

## 2022-02-28 ENCOUNTER — Encounter: Payer: Self-pay | Admitting: Cardiovascular Disease

## 2022-02-28 ENCOUNTER — Ambulatory Visit: Payer: Medicaid Other | Admitting: Cardiovascular Disease

## 2022-02-28 VITALS — BP 116/62 | HR 86 | Ht 60.0 in | Wt 103.4 lb

## 2022-02-28 DIAGNOSIS — J449 Chronic obstructive pulmonary disease, unspecified: Secondary | ICD-10-CM

## 2022-02-28 DIAGNOSIS — E782 Mixed hyperlipidemia: Secondary | ICD-10-CM | POA: Diagnosis not present

## 2022-02-28 DIAGNOSIS — I341 Nonrheumatic mitral (valve) prolapse: Secondary | ICD-10-CM | POA: Diagnosis not present

## 2022-02-28 LAB — BASIC METABOLIC PANEL
BUN/Creatinine Ratio: 15 (ref 9–23)
BUN: 12 mg/dL (ref 6–24)
CO2: 28 mmol/L (ref 20–29)
Calcium: 10.1 mg/dL (ref 8.7–10.2)
Chloride: 99 mmol/L (ref 96–106)
Creatinine, Ser: 0.81 mg/dL (ref 0.57–1.00)
Glucose: 89 mg/dL (ref 70–99)
Potassium: 4.2 mmol/L (ref 3.5–5.2)
Sodium: 142 mmol/L (ref 134–144)
eGFR: 86 mL/min/{1.73_m2} (ref >59)

## 2022-02-28 LAB — LIPID PANEL
Chol/HDL Ratio: 3.5 ratio (ref 0.0–4.4)
Cholesterol, Total: 219 mg/dL — ABNORMAL HIGH (ref 100–199)
HDL: 62 mg/dL (ref >39)
LDL Chol Calc (NIH): 137 mg/dL — ABNORMAL HIGH (ref 0–99)
Triglycerides: 115 mg/dL (ref 0–149)
VLDL Cholesterol Cal: 20 mg/dL (ref 5–40)

## 2022-02-28 LAB — ALT: ALT: 22 IU/L (ref 0–32)

## 2022-02-28 MED ORDER — BISOPROLOL FUMARATE 5 MG PO TABS: 2.5000 mg | ORAL_TABLET | Freq: Every day | ORAL | 3 refills | Status: DC

## 2022-02-28 MED ORDER — ONDANSETRON HCL 4 MG PO TABS: ORAL_TABLET | ORAL | 0 refills | Status: DC

## 2022-02-28 NOTE — Patient Instructions (Signed)
Medication Instructions:  ?START Bisoprolol 2.'5mg'$  daily ?REFILLED Zofran #30 ?*If you need a refill on your cardiac medications before your next appointment, please call your pharmacy* ? ? ?Lab Work: ?Lipids, ALT, BMET Today ?If you have labs (blood work) drawn today and your tests are completely normal, you will receive your results only by: ?MyChart Message (if you have MyChart) OR ?A paper copy in the mail ?If you have any lab test that is abnormal or we need to change your treatment, we will call you to review the results. ? ? ?Testing/Procedures: ?NONE ? ? ?Follow-Up: ?At [facility], you and your health needs are our priority.  As part of our continuing mission to provide you with exceptional heart care, we have created designated Provider Care Teams.  These Care Teams include your primary Cardiologist (physician) and Advanced Practice Providers (APPs -  Physician Assistants and Nurse Practitioners) who all work together to provide you with the care you need, when you need it. ? ?Your next appointment:   ?6 month(s) ? ?The format for your next appointment:   ?In Person ? ?Provider:   ?Mertie Moores, MD  or Robbie Lis, PA-C, [name], NP, or Richardson Dopp, PA-C      ?}  ?  ? ?Important Information About Sugar ? ? ? ? ?  ?

## 2022-02-28 NOTE — Progress Notes (Signed)
Cardiology Office Note:    Date:  02/28/2022   ID:  [name], DOB [birth year], MRN [id number]  PCP:  Natalie Key, DO   [facility] Providers Cardiologist:  Verne Carrow to update primary MD,subspecialty MD or APP then REFRESH:1}    Referring MD: Natalie Key, DO   Chief Complaint  Patient presents with   Mitral Valve Prolapse    Mar 11, 2021:    [name] is a 56 y.o. female with a hx of cigarette smoking, COPD, anxiety. Hx of MVP and HLD She was recently found to have coronary atherosclerosis by CT scan. Still smoking , started at age 48.  Has occasional CP / discomfort Several years ago ( 8 years ago )  Had chest pressure, Radiated up to her jaw . No stress test or cath ( she did not have insurance )  Had   Still having left shoulder pain , radiates down into her chest .   These last a few minutes.  Not associated with eating or drinking , not related to twisting her torso   Does not eat well Becomes nauseated and develops abdominal pain with eating .  ? bowel ischemia  Colonoscopy last month ( [facility])  She did not discuss her post prandial pain   She does not do any cardio exercise .   Takes care of her 4 grandkids.   + family hx of CAD  - mother and father , uncle   Had covid in March 2022.  April 11, 2021 [name] is seen today for follow up of her angina symptoms Significant smoking hx. Extensive wheezing when I met her in May, 2022. + fam hx of CAD No ischemic eval when I first met her because of her wheezing ( not a candidate for Lexiscan) and worldwide IV contrasst shortate - not possible to do a Coronary CTA or cath She is here for follow up .   Has episodes of chest pain ,  intrascapular pain , profuse diaphoresis Is wearing a nicotine patch  Is on ASA 81` Has not tried NTG yet.   We treated her with colchicine for presumed pericarditis  Pain is better.    Will schedule her for cath   Feb 28, 2022  Off and chest pain  Cath  on June 2022 showed 20% prox LAD  Is back smoking  Has MVP    Past Medical History:  Diagnosis Date   Allergy    Anemia    during pregnancies   Anxiety    Arthritis    hands and hip   Cancer ([place])    CAP (community acquired pneumonia) 01/07/2020   Chronic kidney disease    KIDNEYSTONES   Cigarette smoker 11/05/2018   COPD (chronic obstructive pulmonary disease) ([place])    Depression    Heart murmur    MITRAL VALVE PROLASP   High cholesterol    History of endometriosis    Incontinence    Low back pain radiating to both legs 07/25/2020   Menopausal symptoms    Menopause 01/09/2012   Migraine    Myocardial infarction ([place])    silent 3 years ago   Night sweats 10/05/2019   Osteoporosis    Paresthesia of foot, bilateral 03/28/2019   Productive cough 12/25/2019   Renal stone 06/01/2020   Skin cancer    Thyroid disease    Tick bite 02/10/2019    Past Surgical History:  Procedure Laterality Date   ANKLE SURGERY  RIGHT   APPENDECTOMY     AUGMENTATION MAMMAPLASTY     BREAST ENHANCEMENT SURGERY     SALINE   BUNIONECTOMY     BILATERAL FEET   LEFT HEART CATH AND CORONARY ANGIOGRAPHY N/A 04/13/2021   Procedure: LEFT HEART CATH AND CORONARY ANGIOGRAPHY;  Surgeon: Marykay Lex, MD;  Location: [facility] INVASIVE CV LAB;  Service: Cardiovascular;  Laterality: N/A;   TOOTH EXTRACTION N/A 07/11/2021   Procedure: DENTAL RESTORATION / EXTRACTIONS x 13;  Surgeon: Ocie Doyne, DMD;  Location: [facility];  Service: Oral Surgery;  Laterality: N/A;   TOTAL ABDOMINAL HYSTERECTOMY W/ BILATERAL SALPINGOOPHORECTOMY     TVT  10/30/2005   WISDOM TOOTH EXTRACTION     X 4    Current Medications: Current Meds  Medication Sig   albuterol (PROVENTIL) (2.5 MG/3ML) 0.083% nebulizer solution Take 3 mLs (2.5 mg total) by nebulization every 6 (six) hours as needed for wheezing or shortness of breath.   aspirin 81 MG chewable tablet Chew 81 mg by mouth daily.   bisoprolol  (ZEBETA) 5 MG tablet Take 0.5 tablets (2.5 mg total) by mouth daily.   busPIRone (BUSPAR) 10 MG tablet TAKE 1 TABLET BY MOUTH 2 TIMES DAILY AS NEEDED.   EYSUVIS 0.25 % SUSP Place 1 drop into both eyes 3 (three) times daily.   Fluticasone-Umeclidin-Vilant (TRELEGY ELLIPTA) 200-62.5-25 MCG/INH AEPB Inhale 1 puff into the lungs daily.   gabapentin (NEURONTIN) 300 MG capsule TAKE 1 CAPSULE BY MOUTH TWICE A DAY   meloxicam (MOBIC) 15 MG tablet Take 1 tablet (15 mg total) by mouth daily.   MITIGARE 0.6 MG CAPS TAKE 0.6 MG BY MOUTH IN THE MORNING AND AT BEDTIME.   nicotine (NICODERM CQ) 21 mg/24hr patch Place 1 patch (21 mg total) onto the skin daily.   nitroGLYCERIN (NITROSTAT) 0.4 MG SL tablet Place 1 tablet (0.4 mg total) under the tongue every 5 (five) minutes as needed for chest pain.   omeprazole (PRILOSEC) 20 MG capsule TAKE 1 CAPSULE BY MOUTH EVERY DAY   ondansetron (ZOFRAN) 4 MG tablet Take as needed for nausea   ondansetron (ZOFRAN-ODT) 4 MG disintegrating tablet TAKE 1 TABLET BY MOUTH EVERY 8 HOURS AS NEEDED FOR NAUSEA AND VOMITING   Pilocarpine HCl (VUITY) 1.25 % SOLN Place 1 drop into both eyes daily.   PROAIR HFA 108 (90 Base) MCG/ACT inhaler INHALE 1-2 PUFFS BY MOUTH EVERY 6 HOURS AS NEEDED FOR WHEEZE OR SHORTNESS OF BREATH   RESTASIS 0.05 % ophthalmic emulsion Place 1 drop into both eyes 2 (two) times daily.   rosuvastatin (CRESTOR) 40 MG tablet TAKE 1 TABLET BY MOUTH EVERY DAY   sertraline (ZOLOFT) 100 MG tablet TAKE 1 TABLET BY MOUTH EVERY DAY   SUMAtriptan (IMITREX) 6 MG/0.5ML SOLN injection Inject 0.5 mLs (6 mg total) into the skin every 2 (two) hours as needed for migraine or headache. May repeat in 2 hours if headache persists or recurs.  Limit to twice per week.     Allergies:   Dextromethorphan, Doxylamine, Pseudoeph-doxylamine-dm-apap, and Pseudoephedrine hcl   Social History   Socioeconomic History   Marital status: Divorced    Spouse name: Not on file   Number of  children: Not on file   Years of education: Not on file   Highest education level: Not on file  Occupational History   Not on file  Tobacco Use   Smoking status: Every Day    Packs/day: 1.00    Years: 19.00  Pack years: 19.00    Types: Cigarettes   Smokeless tobacco: Never   Tobacco comments:    10 cigerettes daily.  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  Trying to get disability.   Lives with mom.    Highest level of education:  High school   Social Determinants of Health   Financial Resource Strain: Not on file  Food Insecurity: Not on file  Transportation Needs: Not on file  Physical Activity: Not on file  Stress: Not on file  Social Connections: Not on file     Family History: The patient's family history includes Alcohol abuse in her father; Colon cancer in her maternal uncle; Colon polyps in her mother; Diabetes in her maternal grandfather, maternal grandmother, and mother; Heart disease in her brother, mother, and sister; Hyperlipidemia in her brother, mother, and sister; Stomach cancer in her maternal uncle; Tuberculosis in her father.  ROS:   Please see the history of present illness.     All other systems reviewed and are negative.  EKGs/Labs/Other Studies Reviewed:    The following studies were reviewed today:      Recent Labs: 04/11/2021: BUN 8; Creatinine, Ser 0.64; Hemoglobin 12.9; Platelets 320; Potassium 4.3; Sodium 135  Recent Lipid Panel    Component Value Date/Time   CHOL 308 (H) 12/10/2020 1114   TRIG 103 12/10/2020 1114   HDL 44 12/10/2020 1114   CHOLHDL 7.0 (H) 12/10/2020 1114   CHOLHDL 5.9 10/11/2011 1137   VLDL 27 10/11/2011 1137   LDLCALC  246 (H) 12/10/2020 1114     Risk Assessment/Calculations:       Physical Exam:    Physical Exam: Blood pressure 116/62, pulse 86, height 5' (1.524 m), weight 103 lb 6.4 oz (46.9 kg), SpO2 97 %.  GEN:  Well nourished, well developed in no acute distress HEENT: edentulous , cannot wear her dentures  NECK: No JVD; No carotid bruits LYMPHATICS: No lymphadenopathy CARDIAC: RRR ,  + mid systolic click,  no significant murmur  Breast:   R nipple inversion -  RESPIRATORY:  bilateral wheezes, squeeks,  significant congestion  ABDOMEN: Soft, non-tender, non-distended MUSCULOSKELETAL:  No edema; No deformity  SKIN: Warm and dry NEUROLOGIC:  Alert and oriented x 3    EKG: Feb 28, 2022: Normal sinus rhythm at 86.  T wave inversions in the inferior leads.  This is unchanged from previous EKGs.  ASSESSMENT:    1. Chronic obstructive pulmonary disease, unspecified COPD type ([place])   2. Mitral valve prolapse   3. Mixed hyperlipidemia     PLAN:       Chest pain : Still has sharp jabbing chest pains.  She has had heart catheterization which revealed minor proximal LAD stenosis.  Her pains are possibly due to mitral prolapse.  We will try her on bisoprolol 2.5 mg a day.  Hopefully this will not cause any worsening of her COPD/wheezing.   2. COPD -  needs to stop smoking.  She will be picking up nicotine patches today.  She will be seeing pulmonary next week.  3.  R nipple inversion :  she stated  this has been present for a while.   Is up to date on her mammograms.   I've asked her to discuss this with her primary MD.  She does not see a GYN doctor any longer ( after hysterectomy )  She will keep an eye out for any changes in her breasts    Medication Adjustments/Labs and Tests Ordered: Current medicines are reviewed at length with the patient today.  Concerns regarding medicines are outlined above.  Orders Placed This Encounter  Procedures   Lipid panel   ALT   Basic metabolic panel    EKG [id number]    Meds ordered this encounter  Medications   bisoprolol (ZEBETA) 5 MG tablet    Sig: Take 0.5 tablets (2.5 mg total) by mouth daily.    Dispense:  45 tablet    Refill:  3   ondansetron (ZOFRAN) 4 MG tablet    Sig: Take as needed for nausea    Dispense:  30 tablet    Refill:  0      Patient Instructions  Medication Instructions:  START Bisoprolol 2.5mg  daily REFILLED Zofran #30 *If you need a refill on your cardiac medications before your next appointment, please call your pharmacy*   Lab Work: Lipids, ALT, BMET Today If you have labs (blood work) drawn today and your tests are completely normal, you will receive your results only by: [place] (if you have MyChart) OR A paper copy in the mail If you have any lab test that is abnormal or we need to change your treatment, we will call you to review the results.   Testing/Procedures: NONE   Follow-Up: At [facility], you and your health needs are our priority.  As part of our continuing mission to provide you with exceptional heart care, we have created designated Provider Care Teams.  These Care Teams include your primary Cardiologist (physician) and Advanced Practice Providers (APPs -  Physician Assistants and Nurse Practitioners) who all work together to provide you with the care you need, when you need it.  Your next appointment:   6 month(s)  The format for your next appointment:   In Person  Provider:   Mertie Moores, MD  or Robbie Lis, PA-C, [name], NP, or Richardson Dopp, [place]      }     Important Information About Sugar         Signed, Mertie Moores, MD  02/28/2022 8:29 AM    [place]

## 2022-03-01 ENCOUNTER — Other Ambulatory Visit: Payer: Self-pay | Admitting: Cardiovascular Disease

## 2022-03-01 DIAGNOSIS — J449 Chronic obstructive pulmonary disease, unspecified: Secondary | ICD-10-CM

## 2022-03-01 DIAGNOSIS — I341 Nonrheumatic mitral (valve) prolapse: Secondary | ICD-10-CM

## 2022-03-01 DIAGNOSIS — E782 Mixed hyperlipidemia: Secondary | ICD-10-CM

## 2022-03-07 ENCOUNTER — Encounter: Payer: Self-pay | Admitting: Cardiovascular Disease

## 2022-03-08 ENCOUNTER — Other Ambulatory Visit: Payer: Self-pay | Admitting: Cardiovascular Disease

## 2022-03-08 ENCOUNTER — Telehealth: Payer: Self-pay | Admitting: Internal Medicine

## 2022-03-08 ENCOUNTER — Other Ambulatory Visit: Payer: Self-pay | Admitting: Primary Care

## 2022-03-08 ENCOUNTER — Other Ambulatory Visit: Payer: Self-pay | Admitting: Family Medicine

## 2022-03-08 DIAGNOSIS — I341 Nonrheumatic mitral (valve) prolapse: Secondary | ICD-10-CM

## 2022-03-08 DIAGNOSIS — E782 Mixed hyperlipidemia: Secondary | ICD-10-CM

## 2022-03-08 DIAGNOSIS — J449 Chronic obstructive pulmonary disease, unspecified: Secondary | ICD-10-CM

## 2022-03-09 ENCOUNTER — Other Ambulatory Visit: Payer: Self-pay | Admitting: Family Medicine

## 2022-03-09 DIAGNOSIS — E782 Mixed hyperlipidemia: Secondary | ICD-10-CM

## 2022-03-09 NOTE — Telephone Encounter (Signed)
Pt's pharmacy is requesting a refill on ondansetron. Would Dr. Acie Fredrickson like to refill this medication? Please address ?

## 2022-03-09 NOTE — Progress Notes (Signed)
Covering for PCP. LDL 137, current smoker. ASCVD 10 year risk 3.9%. Continue statin without changes. Will send refill.  ?Ezequiel Essex, MD ? ?

## 2022-03-09 NOTE — Telephone Encounter (Signed)
Called patient but she did not answer and her VM was full. Will attempt to call back later.  ?

## 2022-03-09 NOTE — Assessment & Plan Note (Signed)
LDL 137, current smoker. ASCVD 10 year risk 3.9%. Continue statin without changes.  ?

## 2022-03-10 ENCOUNTER — Ambulatory Visit: Payer: Medicaid Other | Admitting: Internal Medicine

## 2022-03-10 NOTE — Telephone Encounter (Signed)
Nahser, Wonda Cheng, MD  You 19 hours ago (4:30 PM)  ? ?I agree that she needs to see a primary care doctor.  ?Ok to refill again .  ? ?When you call her, will you stress the importance of finding the diagnosis of why she is having so much nausea - instead of just treating the symptoms .  ? ?I'm also happy to place a referral to [facility] if she will go see them  ? ?Thanks  ? ?PN   ? ?You  Nahser, Wonda Cheng, MD 20 hours ago (3:51 PM)  ? ?I was thinking you did her a refill of #30 zofran as a one time thing, are you wanting to refill this? I didn't see anything in your last OV note other than she gets nauseous and has abd pain after eating. (Sounds like PCP or GI needed)   ? ?Called and spoke to patient who states that she has an appt with her PCP next month and does not want the referral to GI. She understands that we will send her in another refill, but that in the future, her PCP should be managing this and attempting to find the cause of her nausea. ?

## 2022-03-24 DIAGNOSIS — J449 Chronic obstructive pulmonary disease, unspecified: Secondary | ICD-10-CM | POA: Diagnosis not present

## 2022-03-24 DIAGNOSIS — R32 Unspecified urinary incontinence: Secondary | ICD-10-CM | POA: Diagnosis not present

## 2022-03-31 ENCOUNTER — Other Ambulatory Visit: Payer: Self-pay

## 2022-03-31 DIAGNOSIS — G8929 Other chronic pain: Secondary | ICD-10-CM

## 2022-03-31 MED ORDER — TRELEGY ELLIPTA 200-62.5-25 MCG/ACT IN AEPB: 1.0000 | INHALATION_SPRAY | Freq: Every day | RESPIRATORY_TRACT | 5 refills | Status: DC
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
## 2022-04-02 ENCOUNTER — Telehealth: Payer: Self-pay | Admitting: Pharmacy Technician

## 2022-04-02 NOTE — Telephone Encounter (Signed)
Patient Advocate Encounter  Received notification from [facility] that prior authorization for TRELEGY is required.   PA submitted on 6.4.23 Key [id number] Status is pending   [facility] will continue to follow  Luciano Cutter, CPhT Patient Advocate Phone: [phone]

## 2022-04-03 ENCOUNTER — Other Ambulatory Visit: Payer: Self-pay | Admitting: Family Medicine

## 2022-04-03 MED ORDER — MELOXICAM 15 MG PO TABS: 15.0000 mg | ORAL_TABLET | Freq: Every day | ORAL | 0 refills | Status: DC

## 2022-04-03 NOTE — Telephone Encounter (Signed)
Routing to MR. 

## 2022-04-03 NOTE — Telephone Encounter (Signed)
She was already on Trelegy on I first saw her in April 2022.  She just needs to know that a year later the insurance is denying it.  She has COPD.  What she needs to do is to take Spiriva 2.5 mcg 2 puff once daily Respimat and Dulera 100/4.5.  If she fails this then we can go back to Trelegy.

## 2022-04-03 NOTE — Telephone Encounter (Signed)
Received a fax regarding Prior Authorization from [place] for [place]. Authorization has been DENIED because pt has not had a trial and failure of two formulary preferred drugs, such as SYMBICORT, ADVAIR DISKUS, ADVAIR HFA, or DULERA.

## 2022-04-04 ENCOUNTER — Encounter: Payer: Self-pay | Admitting: *Deleted

## 2022-04-04 NOTE — Telephone Encounter (Signed)
ATC LVMTCB x 1  

## 2022-04-05 ENCOUNTER — Encounter: Payer: Self-pay | Admitting: Family Medicine

## 2022-04-05 ENCOUNTER — Ambulatory Visit
Admission: RE | Admit: 2022-04-05 | Discharge: 2022-04-05 | Disposition: A | Discharge status: Home / Self Care | Payer: Medicaid Other | Source: Ambulatory Visit | Attending: Family Medicine | Admitting: Family Medicine

## 2022-04-05 ENCOUNTER — Other Ambulatory Visit: Payer: Self-pay

## 2022-04-05 ENCOUNTER — Ambulatory Visit (INDEPENDENT_AMBULATORY_CARE_PROVIDER_SITE_OTHER): Payer: Medicaid Other | Admitting: Family Medicine

## 2022-04-05 VITALS — BP 113/67 | HR 82 | Wt 105.8 lb

## 2022-04-05 DIAGNOSIS — M5442 Lumbago with sciatica, left side: Secondary | ICD-10-CM

## 2022-04-05 DIAGNOSIS — R053 Chronic cough: Secondary | ICD-10-CM | POA: Diagnosis not present

## 2022-04-05 DIAGNOSIS — M5441 Lumbago with sciatica, right side: Secondary | ICD-10-CM

## 2022-04-05 DIAGNOSIS — K089 Disorder of teeth and supporting structures, unspecified: Secondary | ICD-10-CM

## 2022-04-05 DIAGNOSIS — R32 Unspecified urinary incontinence: Secondary | ICD-10-CM

## 2022-04-05 DIAGNOSIS — M545 Low back pain, unspecified: Secondary | ICD-10-CM

## 2022-04-05 DIAGNOSIS — G8929 Other chronic pain: Secondary | ICD-10-CM

## 2022-04-05 MED ORDER — MIRABEGRON ER 25 MG PO TB24: 25.0000 mg | ORAL_TABLET | Freq: Every day | ORAL | 0 refills | Status: DC

## 2022-04-05 MED ORDER — MELOXICAM 15 MG PO TABS: 15.0000 mg | ORAL_TABLET | Freq: Every day | ORAL | 0 refills | Status: DC

## 2022-04-05 MED ORDER — CYCLOBENZAPRINE HCL 10 MG PO TABS: 10.0000 mg | ORAL_TABLET | Freq: Three times a day (TID) | ORAL | 0 refills | Status: DC | PRN

## 2022-04-05 NOTE — Patient Instructions (Addendum)
It was great seeing you today!  You were seen for your back pain, and we will check an x-ray at [facility].  I will let you know the results.  You can continue alternating between ice and heat, using the topical gels, and I have also prescribed meloxicam which is a high potency NSAID, as well as Flexeril which is a muscle relaxer to help with your pain.  I have also prescribed the medicine to see if it helps with your urinary incontinence.  I discussed with my attendings and we are not able to complete this type of paperwork here at the clinic, you will need to reach out to social security who has their own providers to fill this out.  Please check-out at the front desk before leaving the clinic.  You will need to return back for your physical exam and we can check on her back at that time as well, but if you need to be seen earlier than that for any new issues we're happy to fit you in, just give [place] a call!  Visit Reminders: - Stop by the pharmacy to pick up your prescriptions  - Continue to work on your healthy eating habits and incorporating exercise into your daily life.   Feel free to call with any questions or concerns at any time, at [phone].   Take care,  Dr. Shary Key [facility]

## 2022-04-05 NOTE — Progress Notes (Signed)
    SUBJECTIVE:   CHIEF COMPLAINT / HPI:   Patient presents for low back pain. States her grandson came running in the house and jumped on her last Thursday. Denies failling. Immediately felt sharp pain. Moves from mid back to sacral area. States her tailbone hurts like crazy and its hard for her to sit. Endorses radiation of pain down both legs. Has tried lidocaine patch, icy hot, Voltaren gel, Tylenol, Ibuprofen without relief. MR lumbar spine in 2020 with small R paracentral disc protrusion at L5-S1 without nerve root encroachment  Does endorse urinary incontinence that she has had chronically. Stated she urinated and pooped on herself on Saturday and Sunday. No burning with urination. Does state she wears pads because she is incontinent had mesh placed which has not helped. Has constant leakage if coughs, sneezes for the past 15 years. Has tried kegels without improvement. Interested in starting medication for it  Also has been coughing more, thinks it may be a URI since daughter and grandson both have been sick over the past week. Denies fevers. Daughter was diagnosed with severe upper respiratory tract infection  Needs nebulizer sent through [place]. Takes Trelegy daily. Has appointment with pulm next month for repeat pulm fucntion testing .  PERTINENT  PMH / PSH: Reviewed   OBJECTIVE:   BP 113/67   Pulse 82   Wt 105 lb 12.8 oz (48 kg)   SpO2 99%   BMI 20.66 kg/m    Physical exam General: well appearing, NAD Cardiovascular: RRR, no murmurs Lungs: CTAB. Normal WOB Abdomen: soft, non-distended, non-tender Skin: warm, dry. No edema MSK Lumbar spine: - Inspection: no gross deformity or asymmetry, swelling or ecchymosis - Palpation:`TTP over the spinous processes and paraspinal muscle - ROM: significantly reduced ROM of the lumbar spine in flexion and extension 2/2 pain - Strength: 5/5 strength of lower extremity in L4-S1 nerve root distributions b/l; slowed gait - Neuro:  sensation intact in the L4-S1 nerve root distribution b/l, 2+ L4 and S1 reflexes - Special testing: Did not assess 2/2 patient reluctance given pain   ASSESSMENT/PLAN:   No problem-specific Assessment & Plan notes found for this encounter.   Given tenderness to spinous processes and paraspinal muscles, limited range of motion secondary to her pain, will get x-ray to rule out an osteoporotic fracture given her increased risk with her age, gender, ethnicity, smoking history.  [name]

## 2022-04-07 ENCOUNTER — Encounter: Payer: Self-pay | Admitting: Family Medicine

## 2022-04-09 DIAGNOSIS — R32 Unspecified urinary incontinence: Secondary | ICD-10-CM | POA: Insufficient documentation

## 2022-04-09 NOTE — Assessment & Plan Note (Addendum)
Patient endorses low back pain that started about a week ago when grandson jumped on her while she was standing. Denies falling. Felt immediate sharp pain that radiates down legs. No loss of urinary or bowel function (has had chronic incontinence). Has tried lidocaine patch, icy hot, Voltaren gel, Tylenol, Ibuprofen without relief. On physical exam tenderness to spinous processes and paraspinal muscles and limited range of motion on secondary to her pain. Will get x-ray to rule out an osteoporotic fracture given her increased risk with her age, gender, ethnicity, smoking history. Will also refill Meloxicam and prescribed Flexeril.

## 2022-04-09 NOTE — Assessment & Plan Note (Signed)
Patient endorses increased coughing over the past week. Unclear if increased sputum production as well. Likely in the setting of URI with known sick contacts as well as worsening COPD and smoking. Patient has pulm follow up scheduled for next month. Patient takes trelegy daily and requests nebulizer through [place]. Will place CCM consult for assistance in DME needs.

## 2022-04-09 NOTE — Assessment & Plan Note (Signed)
On and off for the past 15 years but worsening over the last year. Has to wear pads. Has tried conservative management with pelvic floor exercises. Would like to try medication. Prescribed Myrbetriq '25mg'$  daily

## 2022-04-24 DIAGNOSIS — R32 Unspecified urinary incontinence: Secondary | ICD-10-CM | POA: Diagnosis not present

## 2022-04-24 DIAGNOSIS — J449 Chronic obstructive pulmonary disease, unspecified: Secondary | ICD-10-CM | POA: Diagnosis not present

## 2022-04-26 NOTE — Telephone Encounter (Signed)
Attempted to call pt but unable to reach. Left message for her to return call. Due to  multiple attempts trying to reach pt and unable to do so, per protocol encounter will be closed. 

## 2022-05-01 ENCOUNTER — Ambulatory Visit: Payer: Medicaid Other | Admitting: Internal Medicine

## 2022-05-10 ENCOUNTER — Other Ambulatory Visit: Payer: Self-pay | Admitting: Family Medicine

## 2022-05-10 ENCOUNTER — Other Ambulatory Visit: Payer: Self-pay | Admitting: Cardiovascular Disease

## 2022-05-10 DIAGNOSIS — I341 Nonrheumatic mitral (valve) prolapse: Secondary | ICD-10-CM

## 2022-05-10 DIAGNOSIS — E782 Mixed hyperlipidemia: Secondary | ICD-10-CM

## 2022-05-10 DIAGNOSIS — G8929 Other chronic pain: Secondary | ICD-10-CM

## 2022-05-10 DIAGNOSIS — J449 Chronic obstructive pulmonary disease, unspecified: Secondary | ICD-10-CM

## 2022-05-25 DIAGNOSIS — J449 Chronic obstructive pulmonary disease, unspecified: Secondary | ICD-10-CM | POA: Diagnosis not present

## 2022-05-25 DIAGNOSIS — R32 Unspecified urinary incontinence: Secondary | ICD-10-CM | POA: Diagnosis not present

## 2022-05-30 ENCOUNTER — Other Ambulatory Visit: Payer: Self-pay | Admitting: Cardiovascular Disease

## 2022-05-30 ENCOUNTER — Telehealth: Payer: Self-pay

## 2022-05-30 DIAGNOSIS — I341 Nonrheumatic mitral (valve) prolapse: Secondary | ICD-10-CM

## 2022-05-30 DIAGNOSIS — E782 Mixed hyperlipidemia: Secondary | ICD-10-CM

## 2022-05-30 DIAGNOSIS — J449 Chronic obstructive pulmonary disease, unspecified: Secondary | ICD-10-CM

## 2022-05-30 NOTE — Telephone Encounter (Signed)
Patient calls nurse line reporting possible kidney infection.   Patient reports symptoms started this morning with right sided flank pain that "wraps around my groin." Patient reports pain is constant.   Patient denies fevers, chills, dysuria, hematuria or urinary frequency. Patient does endorse a "darker urine color" however denies odor.   Patient advised she would need to make an apt, however patient declined due to transportation issues.   Patient advised she may need to go to a close UC to her home. Patient advised to push fluids.  Red flags discussed.

## 2022-05-30 NOTE — Telephone Encounter (Signed)
Pt's pharmacy is requesting a refill on Ondansetron. Would Dr. Acie Fredrickson like to refill this medication? Please address

## 2022-06-01 ENCOUNTER — Encounter: Payer: Self-pay | Admitting: Family Medicine

## 2022-06-12 ENCOUNTER — Other Ambulatory Visit: Payer: Self-pay | Admitting: Internal Medicine

## 2022-06-12 DIAGNOSIS — J849 Interstitial pulmonary disease, unspecified: Secondary | ICD-10-CM

## 2022-06-13 ENCOUNTER — Ambulatory Visit (INDEPENDENT_AMBULATORY_CARE_PROVIDER_SITE_OTHER): Payer: Medicaid Other | Admitting: Internal Medicine

## 2022-06-13 ENCOUNTER — Ambulatory Visit: Payer: Medicaid Other | Admitting: Internal Medicine

## 2022-06-13 ENCOUNTER — Encounter: Payer: Self-pay | Admitting: Internal Medicine

## 2022-06-13 VITALS — BP 98/68 | HR 72 | Temp 98.4°F | Ht 61.0 in | Wt 104.2 lb

## 2022-06-13 DIAGNOSIS — J449 Chronic obstructive pulmonary disease, unspecified: Secondary | ICD-10-CM

## 2022-06-13 DIAGNOSIS — Z825 Family history of asthma and other chronic lower respiratory diseases: Secondary | ICD-10-CM

## 2022-06-13 DIAGNOSIS — Z148 Genetic carrier of other disease: Secondary | ICD-10-CM

## 2022-06-13 DIAGNOSIS — J849 Interstitial pulmonary disease, unspecified: Secondary | ICD-10-CM

## 2022-06-13 DIAGNOSIS — F17200 Nicotine dependence, unspecified, uncomplicated: Secondary | ICD-10-CM

## 2022-06-13 DIAGNOSIS — J441 Chronic obstructive pulmonary disease with (acute) exacerbation: Secondary | ICD-10-CM

## 2022-06-13 DIAGNOSIS — Z9189 Other specified personal risk factors, not elsewhere classified: Secondary | ICD-10-CM

## 2022-06-13 DIAGNOSIS — Z7729 Contact with and (suspected ) exposure to other hazardous substances: Secondary | ICD-10-CM

## 2022-06-13 MED ORDER — PREDNISONE 10 MG PO TABS: ORAL_TABLET | ORAL | 0 refills | Status: AC

## 2022-06-13 MED ORDER — DOXYCYCLINE HYCLATE 100 MG PO TABS: 100.0000 mg | ORAL_TABLET | Freq: Two times a day (BID) | ORAL | 0 refills | Status: DC

## 2022-06-13 MED ORDER — TRELEGY ELLIPTA 200-62.5-25 MCG/ACT IN AEPB: 1.0000 | INHALATION_SPRAY | Freq: Every day | RESPIRATORY_TRACT | 0 refills | Status: DC

## 2022-06-13 NOTE — Patient Instructions (Addendum)
ICD-10-CM   1. COPD with acute exacerbation ([place])  J44.1     2. Stage 3 severe COPD by GOLD classification ([place])  J44.9     3. Alpha-1-antitrypsin deficiency carrier  Z14.8     4. Family history of COPD (chronic obstructive pulmonary disease)  Z82.5     5. Current smoker  F17.200     6. At risk from passive smoking  Z91.89     7. Exposure to smoke from wood stove  Z77.29     8. ILD (interstitial lung disease) ([place])  J84.9       You are in the midst of a COPD exacerbation -probably after running out of Trelegy  Lng function shows decline compared to a year ago  -Could be due to ongoing flareup currently versus alpha-1 MZ   Too bad smoking has relapsed Too bad too many people around to smoke I have concern that your family members have alpha-1  Plan  - Take prednisone 40 mg daily x 2 days, then '20mg'$  daily x 2 days, then '10mg'$  daily x 2 days, then '5mg'$  daily x 2 days and stop  - Take doxycycline '100mg'$  po twice daily x 5 days; take after meals and avoid sunlight  -Restart Trelegy 200  -Take sample but we will also request pharmacy department to do prior authorization  -Continue albuterol as needed  -Quit smoking -talk more in detail about this in the future  -Get high-resolution CT chest supine and prone in 2-3 months  - consider bronch v biopsy based on results  -Repeat spirometry with and without bronchodilator in 2-3 months  -Please recommend to family members to get tested for alpha-1  -Can consider you for COPD research studies in the future   Followup  = 2-3 months Dr. Chase Caller   - might need cardiac evaluation if CT shows any cardiac issues -You need regular close follow-up with [place] in the setting of smoking and alpha-1

## 2022-06-13 NOTE — Progress Notes (Signed)
Spirometry and Dlco done today. 

## 2022-06-13 NOTE — Progress Notes (Signed)
OV 02/22/2021  Subjective:  Patient ID: [name], female , DOB: [birth year] , age 56 y.o. , MRN: [id number] , ADDRESS: [place] [id number] PCP Natalie Clan, DO Patient Care Team: Natalie Clan, DO as PCP - General (Family Medicine) Natalie Berthold, DO as Consulting Physician (Neurology)  This Provider for this visit: Treatment Team:  Attending Provider: Brand Males, MD    02/22/2021 -   Chief Complaint  Patient presents with   Consult    Hx of COPD, nodules on CT scan a year or 2 ago.  Pna on 4/2.     HPI [name] 56 y.o. -presents for pulm evaluation because of chronic cough and shortness of breath.  She tells me that she has cough and shortness of breath for few to several years.  Slowly it has been progressive.  In March 2022 she had COVID-19.  Treated as outpatient.  This did not change her baseline symptoms.  She feels symptoms are significant as documented below.  There is associated wheezing.  She is on Trelegy and this helps her.  Albuterol also helps.  She says she has been given a diagnosis of COPD based on CT scan of the chest and symptoms.  She says she has had exacerbations treated by prednisone 3 times as outpatient.  This is also helped her symptoms.  She has over 25 pack smoking history.  Probably over 30 pack.  She quit for many years and then finally started smoking again 5 years ago because of stressors in life.  She finally quit again 2 weeks ago although she says it is very hard to maintain remission.  Even in the years when she quit smoking she was still exposed to the wood smoke.  Her mom continues to smoke and she is exposed to passive smoke.  Her mom has COPD so this family history of COPD.  Unclear if this alpha-1 antitrypsin phenotype.   Of note: She has poor dentition.  She is seen Dr. Gretta [name] in [place] oral surgery.  Given her chronic cough and wheezing and shortness of breath they wanted pulmonary  clearance.  SYMPTOM SCALE - Pulm 02/22/2021   O2 use ra  Shortness of Breath 0 -> 5 scale with 5 being worst (score 6 If unable to do)  At rest 2  Simple tasks - showers, clothes change, eating, shaving 2  Household (dishes, doing bed, laundry) 3.5  Shopping 4  Walking level at own pace 2  Walking up Stairs 3  Total (30-36) Dyspnea Score 18.5  How bad is your cough? 4  How bad is your fatigue 5  How bad is nausea 4  How bad is vomiting?  2  How bad is diarrhea? 0  How bad is anxiety? 5  How bad is depression 5     Exposure hx  - She smoked from age 56 to age 64 x 1 pack/day.  Then she quit till age 81 and for the last 5 years has been smoking 1 pack a day again and then quit 2 weeks ago.  Total smoking history then becomes 78 pack/day  -She owns a large property according to history.  Unclear if it is rule out commercial or residential line.  Nevertheless she does burn a lot of wood on a regular basis constantly and gets exposed to the wood smoke.  Although does not stove is wood smoke exposure there is significant  -No roaches or  mildew in the house.  No mold in the house.  No bird feather in the house.  No pet birds or parakeets.  No gerbils no hamsters.  No down pillow.  No feather jackets.  There are no roaches in the house.  CT Chest data dec 2020   IMPRESSION: Peribronchial thickening with persistent bibasilar ground-glass infiltrates which could be infectious or inflammatory.   Resolution of majority of mucous plugging since previous exam.   Atherosclerotic calcifications including coronary arteries.   Resolution of previously identified 8 mm LEFT upper lobe ground-glass nodule.   Aortic Atherosclerosis (ICD10-I70.0).     Electronically Signed   By: [name] M.D.   On: 10/15/2019 08:59   1 Patient Communication  Component 1 mo ago 3/722  SARS Coronavirus 2 RESULT: POSITIVE Abnormal         PFT  No flowsheet data found.  Simple office walk 185  feet x  3 laps goal with forehead probe 02/22/2021   O2 used ra  Number laps completed 3  Comments about pace x  Resting Pulse Ox/HR 92% and 77/min  Final Pulse Ox/HR 97% and 88/min  Desaturated </= 88% no  Desaturated <= 3% points no  Got Tachycardic >/= 90/min no  Symptoms at end of test no  Miscellaneous comments x    Results for Natalie, [name] (MRN [id number]) as of 02/22/2021 11:48  Ref. Range 12/10/2020 11:14  Creatinine Latest Ref Range: 0.57 - 1.00 mg/dL 0.74  Results for Natalie, [name] (MRN [id number]) as of 02/22/2021 11:48  Ref. Range 02/13/2011 15:21 12/18/2016 19:51 01/29/2021 22:30  Eosinophils Absolute Latest Ref Range: 0.0 - 0.5 K/uL 0.0 0.2 0.2  Results for [name], [name] (MRN [id number]) as of 02/22/2021 11:48  Ref. Range 01/17/2016 10:08  D-Dimer, Quant Latest Ref Range: 0.00 - 0.50 ug/mL-FEU <0.27       03/08/2021 Patient presents today for follow-up. During last visit she was ordered for labs including CBC with diff, IgE, RAST allergy panel and Alpha 1 phenotype. She was also ordered for HRCT. Recommended to continue Trelegy. Pre-op eval for Dr. Gretta [name] DDS at follow-up, may need cardiology eval/clearance if imaging shows any significant cardiac findings. CT Imaging showed significant worsening of chronic extensive patchy ground glass opacities throughout both lungs with associated mild patchy subpleural reticulation and mild cylindrical bronchiectasis. Favoring desquamative interstitial pneumonia. Three vessel coronary atherosclerosis.   She feels her breathing has improve some. She still has a np congested cough, this is not new. She is unable to produce mucus. She is still smoking 10 cigarettes a day. She is interested in quitting and has in the past several years ago. Her primary care physican just put her on chantix. She has no PFTs on file. She is needing clearance for dental work, unclear type of anesthesia. She had no issues with recent colonscopy several weeks  ago.    06/09/2021 - Interim hx  Patient presents today for 3 month follow-up with PFTs. Current smoker, she is still smoking 10 cigarettes a day. She is having a hard time quitting, she tells me that everyone in her life smokes around her. She was unable to tolerate chantix d/t nausea/vomiting for 1 month. She reports improvement in breathing with Trelegy. Uses SABA on average once a day. Albuterol helped her wheezing after she took it today for her breathing test. She still has a chronic np cough, intermittent wheezing and dyspnea on exertion. She was unable to take mucinex because she can  not swallow large pills. HRCT imaging in May 2022 showed significant worsening of smoking related ILD changes, at her last visit we stressed importance of smoking cessation. Breathing test today showed moderate-severe restriction and severe diffusion defect. 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 FENO 23.     Pulmonary function testing 06/09/2021-FVC 1.55 (50%), FEV1 1.34 (55%), ratio 87, TLC 76%, DLCOunc 8.09 (43%)  Labs:  02/25/21 - Eos absolute 700; IgE 2. Rast allergy panel negative.  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 Significant worsening of chronic extensive patchy ground-glass opacities throughout both lungs, lower lobe predominant, with associated mild patchy subpleural reticulation and mild cylindrical bronchiectasis. 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 2. Three-vessel coronary atherosclerosis. 3. Diffuse bronchial wall thickening and mild centrilobular emphysema, compatible with the provided history of COPD. 4. Aortic Atherosclerosis (ICD10-I70.0) and Emphysema (ICD10-J43.9).   Exposure hx  - She smoked from age 82 to age 53 x 1 pack/day.  Then she quit till age 74 and for the last 5 years has been smoking 1 pack a day again and then quit 2 weeks ago.  Total smoking history then becomes 68 pack/day   -She owns a  large property according to history.  Unclear if it is rule out commercial or residential line.  Nevertheless she does burn a lot of wood on a regular basis constantly and gets exposed to the wood smoke.  Although does not stove is wood smoke exposure there is significant   -No roaches or mildew in the house.  No mold in the house.  No bird feather in the house.  No pet birds or parakeets.  No gerbils no hamsters.  No down pillow.  No feather jackets.  There are no roaches in the house.   OV 06/13/2022  Subjective:  Patient ID: [name], female , DOB: 08/03/1966 , age 65 y.o. , MRN: [id number] , ADDRESS: [place] [id number] PCP Shary Key, DO Patient Care Team: Shary Key, DO as PCP - General (Family Medicine) Nahser, Wonda Cheng, MD as PCP - Cardiology (Cardiology) Natalie Berthold, DO as Consulting Physician (Neurology)  This Provider for this visit: Treatment Team:  Attending Provider: Brand Males, MD    06/13/2022 -   Chief Complaint  Patient presents with   Follow-up    PFT performed today.  Pt states she has been having pain in sides and back when she coughs or breathes. States her breathing has been worse.   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 Mertie Moores May 2023  HPI [name] 56 y.o. -returns for follow-up.  Its been a year since I saw her.  At that time there is concern significantly for DIP which is deforming to interstitial lung disease on account of her smoking.  So we also do pulmonary function test.  She did pulmonary  function test today.  She has severe obstruction and her FEV1 is significantly declined compared to a year ago..  I personally visualized PFT.  Unable to get this to flow into  the epic at this point in time.  She tells me that overall she continues to have significant cough.  She also tries to bring her to phlegm and she can taste it to be of metallic taste but unable to expectorate.  She is on Trelegy but she ran out a few weeks ago because of insurance issues.  She is also complaining about pain in her scapular region from coughing a lot.  And for the last few days she has had increased cough, congestion, tightness and increased wheezing.  She feels she is in a COPD exacerbation at this point.  Her smoking has relapsed.  People around her house smoke.  She has extensive family members with COPD.  Her mother is going to see 1 of pulmonary physicians in the office in the next few weeks.  She is interested in research studies.     SYMPTOM SCALE - Pulm 02/22/2021   06/09/2021   O2 use ra RA  Shortness of Breath 0 -> 5 scale with 5 being worst (score 6 If unable to do)   At rest 2 2  Simple tasks - showers, clothes change, eating, shaving 2 3  Household (dishes, doing bed, laundry) 3.5 4  Shopping 4 4  Walking level at own pace 2 3  Walking up Stairs 3 4  Total (30-36) Dyspnea Score 18.5 20  How bad is your cough? 4 3  How bad is your fatigue 5 5  How bad is nausea 4 4  How bad is vomiting?   2 2  How bad is diarrhea? 0 0  How bad is anxiety? 5 3  How bad is depression 5 3    PFT     Latest Ref Rng & Units 06/09/2021    9:05 AM  PFT Results  FVC-Pre L 1.61   FVC-Predicted Pre % 52   FVC-Post L 1.55   FVC-Predicted Post % 50   Pre FEV1/FVC % % 81   Post FEV1/FCV % % 87   FEV1-Pre L 1.30   FEV1-Predicted Pre % 53   FEV1-Post L 1.34   DLCO uncorrected ml/min/mmHg 8.09   DLCO UNC% % 43   DLCO corrected ml/min/mmHg 8.09   DLCO COR %Predicted % 43   DLVA Predicted % 83   TLC L 3.52    TLC % Predicted % 76   RV % Predicted % 110        has a past medical history of Allergy, Anemia, Anxiety, Arthritis, Cancer ([place]), CAP (community acquired pneumonia) (01/07/2020), Chronic kidney disease, Cigarette smoker (11/05/2018), COPD (chronic obstructive pulmonary disease) ([place]), Depression, Heart murmur, High cholesterol, History of endometriosis, Incontinence, Low back pain radiating to both legs (07/25/2020), Menopausal symptoms, Menopause (01/09/2012), Migraine, Myocardial infarction [facility]), Night sweats (10/05/2019), Osteoporosis, Paresthesia of foot, bilateral (03/28/2019), Productive cough (12/25/2019), Renal stone (06/01/2020), Skin cancer, Thyroid disease, and Tick bite (02/10/2019).   reports that she has been smoking cigarettes. She has a 19.00 pack-year smoking history. She has never used smokeless tobacco.  Past Surgical History:  Procedure Laterality Date   ANKLE SURGERY     RIGHT   APPENDECTOMY     AUGMENTATION MAMMAPLASTY     BREAST ENHANCEMENT SURGERY     SALINE   BUNIONECTOMY     BILATERAL FEET   LEFT HEART CATH AND CORONARY ANGIOGRAPHY N/A 04/13/2021   Procedure: LEFT HEART CATH AND CORONARY ANGIOGRAPHY;  Surgeon: Leonie Man, MD;  Location: [facility];  Service: Cardiovascular;  Laterality:  N/A;   TOOTH EXTRACTION N/A 07/11/2021   Procedure: DENTAL RESTORATION / EXTRACTIONS x 13;  Surgeon: Diona Browner, DMD;  Location: [facility];  Service: Oral Surgery;  Laterality: N/A;   TOTAL ABDOMINAL HYSTERECTOMY W/ BILATERAL SALPINGOOPHORECTOMY     TVT  10/30/2005   WISDOM TOOTH EXTRACTION     X 4    Allergies  Allergen Reactions   Dextromethorphan Other (See Comments)    keeps awake, legs constantly moving    Doxylamine Other (See Comments)    keeps awake, legs constantly moving    Pseudoeph-Doxylamine-Dm-Apap Other (See Comments)    REACTION: keeps awake, legs constantly moving  NYQUIL   Pseudoephedrine Hcl Other (See  Comments)    keeps awake, legs constantly moving     Immunization History  Administered Date(s) Administered   Influenza,inj,Quad PF,6+ Mos 12/10/2020   Pneumococcal-Unspecified 02/10/2021   Td 05/30/2002   Tdap 11/12/2011, 02/16/2021   Zoster Recombinat (Shingrix) 12/12/2020, 02/16/2021    Family History  Problem Relation Age of Onset   Diabetes Mother    Heart disease Mother    Hyperlipidemia Mother    Colon polyps Mother    Tuberculosis Father    Alcohol abuse Father    Heart disease Sister    Hyperlipidemia Sister    Heart disease Brother    Hyperlipidemia Brother    Diabetes Maternal Grandmother    Diabetes Maternal Grandfather    Colon cancer Maternal Uncle    Stomach cancer Maternal Uncle      Current Outpatient Medications:    albuterol (PROVENTIL) (2.5 MG/3ML) 0.083% nebulizer solution, INHALE 3 ML BY NEBULIZATION EVERY 6 HOURS AS NEEDED FOR WHEEZING OR SHORTNESS OF BREATH, Disp: 75 mL, Rfl: 5   aspirin 81 MG chewable tablet, Chew 81 mg by mouth daily., Disp: , Rfl:    bisoprolol (ZEBETA) 5 MG tablet, Take 0.5 tablets (2.5 mg total) by mouth daily., Disp: 45 tablet, Rfl: 3   busPIRone (BUSPAR) 10 MG tablet, TAKE 1 TABLET BY MOUTH 2 TIMES DAILY AS NEEDED., Disp: 180 tablet, Rfl: 3   cyclobenzaprine (FLEXERIL) 10 MG tablet, Take 1 tablet (10 mg total) by mouth 3 (three) times daily as needed for muscle spasms., Disp: 30 tablet, Rfl: 0   doxycycline (VIBRA-TABS) 100 MG tablet, Take 1 tablet (100 mg total) by mouth 2 (two) times daily., Disp: 10 tablet, Rfl: 0   EYSUVIS 0.25 % SUSP, Place 1 drop into both eyes 3 (three) times daily., Disp: , Rfl:    Fluticasone-Umeclidin-Vilant (TRELEGY ELLIPTA) 200-62.5-25 MCG/ACT AEPB, Inhale 1 puff into the lungs daily., Disp: 60 each, Rfl: 5   Fluticasone-Umeclidin-Vilant (TRELEGY ELLIPTA) 200-62.5-25 MCG/ACT AEPB, Inhale 1 puff into the lungs daily., Disp: 14 each, Rfl: 0   gabapentin (NEURONTIN) 300 MG capsule, TAKE 1 CAPSULE BY  MOUTH TWICE A DAY, Disp: 90 capsule, Rfl: 3   meloxicam (MOBIC) 15 MG tablet, TAKE 1 TABLET (15 MG TOTAL) BY MOUTH DAILY., Disp: 20 tablet, Rfl: 0   mirabegron ER (MYRBETRIQ) 25 MG TB24 tablet, Take 1 tablet (25 mg total) by mouth daily., Disp: 30 tablet, Rfl: 0   MITIGARE 0.6 MG CAPS, TAKE 0.6 MG BY MOUTH IN THE MORNING AND AT BEDTIME., Disp: 180 capsule, Rfl: 2   nicotine (NICODERM CQ) 21 mg/24hr patch, Place 1 patch (21 mg total) onto the skin daily., Disp: 28 patch, Rfl: 2   nitroGLYCERIN (NITROSTAT) 0.4 MG SL tablet, Place 1 tablet (0.4 mg total) under the tongue every 5 (five) minutes  as needed for chest pain., Disp: 25 tablet, Rfl: 1   omeprazole (PRILOSEC) 20 MG capsule, TAKE 1 CAPSULE BY MOUTH EVERY DAY, Disp: 90 capsule, Rfl: 1   ondansetron (ZOFRAN) 4 MG tablet, TAKE AS NEEDED FOR NAUSEA, Disp: 30 tablet, Rfl: 0   ondansetron (ZOFRAN-ODT) 4 MG disintegrating tablet, TAKE 1 TABLET BY MOUTH EVERY 8 HOURS AS NEEDED FOR NAUSEA AND VOMITING, Disp: 30 tablet, Rfl: 0   Pilocarpine HCl (VUITY) 1.25 % SOLN, Place 1 drop into both eyes daily., Disp: , Rfl:    predniSONE (DELTASONE) 10 MG tablet, Take 4 tablets (40 mg total) by mouth daily with breakfast for 2 days, THEN 2 tablets (20 mg total) daily with breakfast for 2 days, THEN 1 tablet (10 mg total) daily with breakfast for 2 days, THEN 0.5 tablets (5 mg total) daily with breakfast for 2 days., Disp: 15 tablet, Rfl: 0   PROAIR HFA 108 (90 Base) MCG/ACT inhaler, INHALE 1-2 PUFFS BY MOUTH EVERY 6 HOURS AS NEEDED FOR WHEEZE OR SHORTNESS OF BREATH, Disp: 8.5 each, Rfl: 2   RESTASIS 0.05 % ophthalmic emulsion, Place 1 drop into both eyes 2 (two) times daily., Disp: , Rfl:    rosuvastatin (CRESTOR) 40 MG tablet, TAKE 1 TABLET BY MOUTH EVERY DAY, Disp: 90 tablet, Rfl: 3   sertraline (ZOLOFT) 100 MG tablet, TAKE 1 TABLET BY MOUTH EVERY DAY, Disp: 90 tablet, Rfl: 2   SUMAtriptan (IMITREX) 6 MG/0.5ML SOLN injection, Inject 0.5 mLs (6 mg total) into the  skin every 2 (two) hours as needed for migraine or headache. May repeat in 2 hours if headache persists or recurs.  Limit to twice per week., Disp: 0.5 mL, Rfl: 8      Objective:   Vitals:   06/13/22 0942  BP: 98/68  Pulse: 72  Temp: 98.4 F (36.9 C)  TempSrc: Oral  SpO2: 94%  Weight: 104 lb 3.2 oz (47.3 kg)  Height: '5\' 1"'$  (1.549 m)    Estimated body mass index is 19.69 kg/m as calculated from the following:   Height as of this encounter: '5\' 1"'$  (1.549 m).   Weight as of this encounter: 104 lb 3.2 oz (47.3 kg).  '@WEIGHTCHANGE'$ @  [employer]   06/13/22 0942  Weight: 104 lb 3.2 oz (47.3 kg)     Physical Exam    General: No distress. think Neuro: Alert and Oriented x 3. GCS 15. Speech normal Psych: Pleasant Resp:  Barrel Chest - no.  Wheeze - YES, Crackles - NO, No overt respiratory distress CVS: Normal heart sounds. Murmurs - no Ext: Stigmata of Connective Tissue Disease - no HEENT: Normal upper airway. PEERL +. No post nasal drip        Assessment:       ICD-10-CM   1. COPD with acute exacerbation ([place])  J44.1     2. Stage 3 severe COPD by GOLD classification ([place])  J44.9 CT Chest High Resolution    Pulmonary function test    Ambulatory Referral for DME    3. Alpha-1-antitrypsin deficiency carrier  Z14.8     4. Family history of COPD (chronic obstructive pulmonary disease)  Z82.5     5. Current smoker  F17.200     6. At risk from passive smoking  Z91.89     7. Exposure to smoke from wood stove  Z77.29     8. ILD (interstitial lung disease) ([place])  J84.9 CT Chest High Resolution         Plan:  Patient Instructions     ICD-10-CM   1. COPD with acute exacerbation ([place])  J44.1     2. Stage 3 severe COPD by GOLD classification ([place])  J44.9     3. Alpha-1-antitrypsin deficiency carrier  Z14.8     4. Family history of COPD (chronic obstructive pulmonary disease)  Z82.5     5. Current smoker  F17.200     6. At risk from passive smoking   Z91.89     7. Exposure to smoke from wood stove  Z77.29     8. ILD (interstitial lung disease) ([place])  J84.9       You are in the midst of a COPD exacerbation -probably after running out of Trelegy  Lng function shows decline compared to a year ago  -Could be due to ongoing flareup currently versus alpha-1 MZ   Too bad smoking has relapsed Too bad too many people around to smoke I have concern that your family members have alpha-1  Plan  - Take prednisone 40 mg daily x 2 days, then '20mg'$  daily x 2 days, then '10mg'$  daily x 2 days, then '5mg'$  daily x 2 days and stop  - Take doxycycline '100mg'$  po twice daily x 5 days; take after meals and avoid sunlight  -Restart Trelegy 200  -Take sample but we will also request pharmacy department to do prior authorization  -Continue albuterol as needed  -Quit smoking -talk more in detail about this in the future  -Get high-resolution CT chest supine and prone in 2-3 months  - consider bronch v biopsy based on results  -Repeat spirometry with and without bronchodilator in 2-3 months  -Please recommend to family members to get tested for alpha-1  -Can consider you for COPD research studies in the future   Followup  = 2-3 months Dr. Chase Caller   - might need cardiac evaluation if CT shows any cardiac issues -You need regular close follow-up with [place] in the setting of smoking and alpha-1    SIGNATURE    Dr. Brand [name], M.D., F.C.C.P,  Pulmonary and Critical Care Medicine Staff Physician, [place] Director - Interstitial Lung Disease  Program  Pulmonary [place] [facility] [place], [place], [address]  Pager: [phone], If no answer or between  15:00h - 7:00h: call [phone] Telephone: [phone]  10:52 AM 06/13/2022

## 2022-06-19 ENCOUNTER — Ambulatory Visit (INDEPENDENT_AMBULATORY_CARE_PROVIDER_SITE_OTHER): Payer: Medicaid Other

## 2022-06-19 ENCOUNTER — Ambulatory Visit
Admission: RE | Admit: 2022-06-19 | Discharge: 2022-06-19 | Disposition: A | Discharge status: Home / Self Care | Payer: Medicaid Other | Source: Ambulatory Visit | Attending: Internal Medicine | Admitting: Internal Medicine

## 2022-06-19 VITALS — BP 116/68 | HR 83 | Temp 98.1°F | Resp 18

## 2022-06-19 DIAGNOSIS — M545 Low back pain, unspecified: Secondary | ICD-10-CM

## 2022-06-19 DIAGNOSIS — J479 Bronchiectasis, uncomplicated: Secondary | ICD-10-CM | POA: Diagnosis not present

## 2022-06-19 DIAGNOSIS — R0689 Other abnormalities of breathing: Secondary | ICD-10-CM | POA: Diagnosis not present

## 2022-06-19 DIAGNOSIS — R1013 Epigastric pain: Secondary | ICD-10-CM

## 2022-06-19 DIAGNOSIS — R053 Chronic cough: Secondary | ICD-10-CM

## 2022-06-19 DIAGNOSIS — R0781 Pleurodynia: Secondary | ICD-10-CM | POA: Diagnosis not present

## 2022-06-19 LAB — POCT URINALYSIS DIP (MANUAL ENTRY)
Bilirubin, UA: NEGATIVE
Blood, UA: NEGATIVE
Glucose, UA: NEGATIVE mg/dL
Ketones, POC UA: NEGATIVE mg/dL
Leukocytes, UA: NEGATIVE
Nitrite, UA: NEGATIVE
Protein Ur, POC: NEGATIVE mg/dL
Spec Grav, UA: 1.02 (ref 1.010–1.025)
Urobilinogen, UA: 0.2 E.U./dL
pH, UA: 7 (ref 5.0–8.0)

## 2022-06-19 MED ORDER — BENZONATATE 100 MG PO CAPS: 100.0000 mg | ORAL_CAPSULE | Freq: Three times a day (TID) | ORAL | 0 refills | Status: DC | PRN

## 2022-06-19 MED ORDER — LIDOCAINE 5 % EX PTCH
1.0000 | MEDICATED_PATCH | CUTANEOUS | 0 refills | Status: AC
Start: 2022-06-19 — End: ?

## 2022-06-19 MED ORDER — SUCRALFATE 1 G PO TABS: 1.0000 g | ORAL_TABLET | Freq: Three times a day (TID) | ORAL | 0 refills | Status: DC

## 2022-06-19 NOTE — ED Provider Notes (Addendum)
[facility]    CSN: [id number] Arrival date & time: 06/19/22  1415      History   Chief Complaint Chief Complaint  Patient presents with   Pleurisy   Flank Pain    HPI [name] is a 56 y.o. female.   Patient presents today with several different chief complaints.  Patient reports bilateral rib pain that has been present for a few days.  She is attributing this to forceful coughing.  Patient reports that she has baseline persistent cough given recent diagnosis of COPD.  She takes Trelegy and albuterol as needed.  She was recently seen by pulmonology and prescribed doxycycline as well as prednisone steroid taper.  Denies any associated upper respiratory symptoms or fever.  Patient also reporting some epigastric abdominal pain that is described as a burning and shooting pain.  Reports that it typically only happens when she eats food.  Patient takes omeprazole daily with no improvement.  Denies nausea, vomiting, diarrhea, blood in stool.  Patient also reporting some right lower back pain.  Patient is concerned for kidney stone given that she has a history.  Denies urinary burning, urinary frequency, hematuria, vaginal discharge, lower abdominal pain, fever.  Denies any injury to the area.   Flank Pain    Past Medical History:  Diagnosis Date   Allergy    Anemia    during pregnancies   Anxiety    Arthritis    hands and hip   Cancer ([place])    CAP (community acquired pneumonia) 01/07/2020   Chronic kidney disease    KIDNEYSTONES   Cigarette smoker 11/05/2018   COPD (chronic obstructive pulmonary disease) (HCC)    Depression    Heart murmur    MITRAL VALVE PROLASP   High cholesterol    History of endometriosis    Incontinence    Low back pain radiating to both legs 07/25/2020   Menopausal symptoms    Menopause 01/09/2012   Migraine    Myocardial infarction ([place])    silent 3 years ago   Night sweats 10/05/2019   Osteoporosis    Paresthesia of foot,  bilateral 03/28/2019   Productive cough 12/25/2019   Renal stone 06/01/2020   Skin cancer    Thyroid disease    Tick bite 02/10/2019    Patient Active Problem List   Diagnosis Date Noted   Urinary incontinence 04/09/2022   ILD (interstitial lung disease) ([place]) 06/27/2021   Unstable angina ([place]) 04/13/2021   Atypical nevi 03/17/2021   Atherosclerosis 03/08/2021   Grief reaction 03/02/2021   Acute diarrhea 12/29/2020   Acute right lumbar radiculopathy 12/11/2020   Healthcare maintenance 12/11/2020   Acute lumbar back pain 07/25/2020   COPD (chronic obstructive pulmonary disease) ([place]) 06/01/2020   GAD (generalized anxiety disorder) 12/26/2019   Poor social situation 08/29/2019   Abnormal chest CT 08/29/2019   Poor dentition 06/25/2019   Lung nodule, solitary 06/25/2019   Chronic cough 11/26/2018   Recent unintentional weight loss over several months 11/05/2018   Current smoker 11/05/2018   Paresthesia 07/22/2018   Skull deformity 07/22/2018   Hyperlipidemia 07/22/2018   Depression 01/09/2012   Migraine with aura 05/09/2007   Mitral valve prolapse 05/09/2007    Past Surgical History:  Procedure Laterality Date   ANKLE SURGERY     RIGHT   APPENDECTOMY     AUGMENTATION MAMMAPLASTY     BREAST ENHANCEMENT SURGERY     SALINE   BUNIONECTOMY     BILATERAL FEET  LEFT HEART CATH AND CORONARY ANGIOGRAPHY N/A 04/13/2021   Procedure: LEFT HEART CATH AND CORONARY ANGIOGRAPHY;  Surgeon: Leonie Man, MD;  Location: [facility];  Service: Cardiovascular;  Laterality: N/A;   TOOTH EXTRACTION N/A 07/11/2021   Procedure: DENTAL RESTORATION / EXTRACTIONS x 13;  Surgeon: Diona Browner, DMD;  Location: [facility];  Service: Oral Surgery;  Laterality: N/A;   TOTAL ABDOMINAL HYSTERECTOMY W/ BILATERAL SALPINGOOPHORECTOMY     TVT  10/30/2005   WISDOM TOOTH EXTRACTION     X 4    OB History     Gravida  2   Para  2   Term  0   Preterm  0   AB  0    Living  2      SAB  0   IAB  0   Ectopic  0   Multiple      Live Births  2            Home Medications    Prior to Admission medications   Medication Sig Start Date End Date Taking? Authorizing Provider  benzonatate (TESSALON) 100 MG capsule Take 1 capsule (100 mg total) by mouth every 8 (eight) hours as needed for cough. 06/19/22  Yes Lynda Wanninger, Hildred Alamin E, FNP  lidocaine (LIDODERM) 5 % Place 1 patch onto the skin daily. Remove & Discard patch within 12 hours or as directed by MD 06/19/22  Yes Teodora Medici, FNP  sucralfate (CARAFATE) 1 g tablet Take 1 tablet (1 g total) by mouth 4 (four) times daily -  with meals and at bedtime. 06/19/22  Yes Angeles Zehner, Hildred Alamin E, FNP  albuterol (PROVENTIL) (2.5 MG/3ML) 0.083% nebulizer solution INHALE 3 ML BY NEBULIZATION EVERY 6 HOURS AS NEEDED FOR WHEEZING OR SHORTNESS OF BREATH 03/09/22   Martyn Ehrich, NP  aspirin 81 MG chewable tablet Chew 81 mg by mouth daily.    [provider]  bisoprolol (ZEBETA) 5 MG tablet Take 0.5 tablets (2.5 mg total) by mouth daily. 02/28/22   Nahser, Wonda Cheng, MD  busPIRone (BUSPAR) 10 MG tablet TAKE 1 TABLET BY MOUTH 2 TIMES DAILY AS NEEDED. 08/26/21   Shary Key, DO  cyclobenzaprine (FLEXERIL) 10 MG tablet Take 1 tablet (10 mg total) by mouth 3 (three) times daily as needed for muscle spasms. 04/05/22   Shary Key, DO  doxycycline (VIBRA-TABS) 100 MG tablet Take 1 tablet (100 mg total) by mouth 2 (two) times daily. 06/13/22   Brand Males, MD  EYSUVIS 0.25 % SUSP Place 1 drop into both eyes 3 (three) times daily. 02/01/21   [provider]  Fluticasone-Umeclidin-Vilant (TRELEGY ELLIPTA) 200-62.5-25 MCG/ACT AEPB Inhale 1 puff into the lungs daily. 03/31/22   Brand Males, MD  Fluticasone-Umeclidin-Vilant (TRELEGY ELLIPTA) 200-62.5-25 MCG/ACT AEPB Inhale 1 puff into the lungs daily. 06/13/22   Brand Males, MD  gabapentin (NEURONTIN) 300 MG capsule TAKE 1 CAPSULE BY MOUTH TWICE A DAY  01/24/22   Paige, Victoria J, DO  meloxicam (MOBIC) 15 MG tablet TAKE 1 TABLET (15 MG TOTAL) BY MOUTH DAILY. 05/10/22   Shary Key, DO  mirabegron ER (MYRBETRIQ) 25 MG TB24 tablet Take 1 tablet (25 mg total) by mouth daily. 04/05/22   Shary Key, DO  MITIGARE 0.6 MG CAPS TAKE 0.6 MG BY MOUTH IN THE MORNING AND AT BEDTIME. 06/30/21   Nahser, Wonda Cheng, MD  nicotine (NICODERM CQ) 21 mg/24hr patch Place 1 patch (21 mg total) onto the  skin daily. 06/30/21   Brand Males, MD  nitroGLYCERIN (NITROSTAT) 0.4 MG SL tablet Place 1 tablet (0.4 mg total) under the tongue every 5 (five) minutes as needed for chest pain. 03/11/21   Nahser, Wonda Cheng, MD  omeprazole (PRILOSEC) 20 MG capsule TAKE 1 CAPSULE BY MOUTH EVERY DAY 09/14/21   Shary Key, DO  ondansetron (ZOFRAN) 4 MG tablet TAKE AS NEEDED FOR NAUSEA 03/10/22   Nahser, Wonda Cheng, MD  ondansetron (ZOFRAN-ODT) 4 MG disintegrating tablet TAKE 1 TABLET BY MOUTH EVERY 8 HOURS AS NEEDED FOR NAUSEA AND VOMITING 08/16/21   Martyn Ehrich, NP  Pilocarpine HCl (VUITY) 1.25 % SOLN Place 1 drop into both eyes daily.    [provider]  predniSONE (DELTASONE) 10 MG tablet Take 4 tablets (40 mg total) by mouth daily with breakfast for 2 days, THEN 2 tablets (20 mg total) daily with breakfast for 2 days, THEN 1 tablet (10 mg total) daily with breakfast for 2 days, THEN 0.5 tablets (5 mg total) daily with breakfast for 2 days. 06/13/22 06/21/22  Brand Males, MD  PROAIR HFA 108 [phone] Base) MCG/ACT inhaler INHALE 1-2 PUFFS BY MOUTH EVERY 6 HOURS AS NEEDED FOR WHEEZE OR SHORTNESS OF BREATH 09/15/21   Martyn Ehrich, NP  RESTASIS 0.05 % ophthalmic emulsion Place 1 drop into both eyes 2 (two) times daily. 03/16/21   [provider]  rosuvastatin (CRESTOR) 40 MG tablet TAKE 1 TABLET BY MOUTH EVERY DAY 03/09/22   Ezequiel Essex, MD  sertraline (ZOLOFT) 100 MG tablet TAKE 1 TABLET BY MOUTH EVERY DAY 07/01/21   Arby Barrette, Weldon Picking, DO  SUMAtriptan  (IMITREX) 6 MG/0.5ML SOLN injection Inject 0.5 mLs (6 mg total) into the skin every 2 (two) hours as needed for migraine or headache. May repeat in 2 hours if headache persists or recurs.  Limit to twice per week. 11/04/21   Alda Berthold, DO    Family History Family History  Problem Relation Age of Onset   Diabetes Mother    Heart disease Mother    Hyperlipidemia Mother    Colon polyps Mother    Tuberculosis Father    Alcohol abuse Father    Heart disease Sister    Hyperlipidemia Sister    Heart disease Brother    Hyperlipidemia Brother    Diabetes Maternal Grandmother    Diabetes Maternal Grandfather    Colon cancer Maternal Uncle    Stomach cancer Maternal Uncle     Social History Social History   Tobacco Use   Smoking status: Every Day    Packs/day: 1.00    Years: 19.00    Total pack years: 19.00    Types: Cigarettes   Smokeless tobacco: Never   Tobacco comments:    0.5-1ppd as of 06/13/22 ep  Vaping Use   Vaping Use: Never used  Substance Use Topics   Alcohol use: Yes    Comment: OCCASIONALLY   Drug use: No     Allergies   Dextromethorphan, Doxylamine, Pseudoeph-doxylamine-dm-apap, and Pseudoephedrine hcl   Review of Systems Review of Systems Per HPI  Physical Exam Triage Vital Signs ED Triage Vitals  Enc Vitals Group     BP 06/19/22 1442 116/68     Pulse Rate 06/19/22 1442 83     Resp 06/19/22 1442 18     Temp 06/19/22 1442 98.1 F (36.7 C)     Temp src --      SpO2 06/19/22 1442 97 %  Weight --      Height --      Head Circumference --      Peak Flow --      Pain Score 06/19/22 1445 6     Pain Loc --      Pain Edu? --      Excl. in [place]? --    No data found.  Updated Vital Signs BP 116/68   Pulse 83   Temp 98.1 F (36.7 C)   Resp 18   SpO2 97%   Visual Acuity Right Eye Distance:   Left Eye Distance:   Bilateral Distance:    Right Eye Near:   Left Eye Near:    Bilateral Near:     Physical Exam Constitutional:       General: She is not in acute distress.    Appearance: Normal appearance. She is not toxic-appearing or diaphoretic.  HENT:     Head: Normocephalic and atraumatic.     Nose: Nose normal.  Eyes:     Extraocular Movements: Extraocular movements intact.     Conjunctiva/sclera: Conjunctivae normal.  Cardiovascular:     Rate and Rhythm: Normal rate and regular rhythm.     Pulses: Normal pulses.     Heart sounds: Normal heart sounds.  Pulmonary:     Effort: Pulmonary effort is normal. No respiratory distress.     Breath sounds: Rhonchi present.  Abdominal:     General: Bowel sounds are normal. There is no distension.     Palpations: Abdomen is soft.     Tenderness: There is abdominal tenderness in the epigastric area and left upper quadrant.  Musculoskeletal:       Back:     Comments: Tenderness to palpation to right lower lumbar region.  No direct spinal tenderness, crepitus, step-off.  Neurological:     General: No focal deficit present.     Mental Status: She is alert and oriented to person, place, and time. Mental status is at baseline.     Deep Tendon Reflexes: Reflexes are normal and symmetric.  Psychiatric:        Mood and Affect: Mood normal.        Behavior: Behavior normal.        Thought Content: Thought content normal.        Judgment: Judgment normal.      UC Treatments / Results  Labs (all labs ordered are listed, but only abnormal results are displayed) Labs Reviewed  POCT URINALYSIS DIP (MANUAL ENTRY)    EKG   Radiology DG Chest 2 View  Result Date: 06/19/2022 CLINICAL DATA:  adventitious lung sounds EXAM: CHEST - 2 VIEW COMPARISON:  01/29/2021 FINDINGS: Persistent interstitial changes. Bibasilar bronchiectasis. No pleural effusion or pneumothorax. Normal heart size. No acute osseous abnormality. IMPRESSION: No definite acute abnormality. There are chronic interstitial changes there presumably reflect known interstitial lung disease. Electronically Signed    By: Macy Mis M.D.   On: 06/19/2022 15:25    Procedures Procedures (including critical care time)  Medications Ordered in UC Medications - No data to display  Initial Impression / Assessment and Plan / UC Course  I have reviewed the triage vital signs and the nursing notes.  Pertinent labs & imaging results that were available during my care of the patient were reviewed by me and considered in my medical decision making (see chart for details).     Chest x-ray was negative for any acute cardiopulmonary process. It does show chronic changes which  is patient's baseline.  Suspect that patient's right bilateral rib pain is from forceful coughing.  Will prescribe benzonatate for cough.  Encouraged to follow-up with pulmonology for further evaluation and management.  No concern for PE or any worrisome etiology.  Rhonchi versus crackles noted on exam which is most likely patient's baseline lung evaluation.  Patient having epigastric pain which is concerning for reflux versus stomach ulcer versus gastritis.  suspicious of gastritis related to recent prednisone steroid taper.  Patient was offered Maalox in urgent care to help alleviate discomfort but patient declined.  Will treat with Carafate to help coat stomach.  Do not think that emergent evaluation is necessary given no obvious severe abdominal pain and vital signs being stable.  Urinalysis was completely normal.  Low suspicion for kidney stone given no hematuria or red blood cells noted on UA.  Pain is also reproducible with palpation which points toward musculoskeletal origin.  Will treat with lidocaine patch and patient was encouraged to use Tylenol.  Patient to avoid NSAIDs given gastritis symptoms.  Patient was given strict return and ER return precautions for all chief complaints today.  Patient verbalized understanding and was agreeable with plan. Final Clinical Impressions(s) / UC Diagnoses   Final diagnoses:  Rib pain  Abdominal  pain, epigastric  Acute right-sided low back pain without sciatica  Persistent cough     Discharge Instructions      Chest x-ray was not any different than your baseline.  Suspect rib pain is from forceful coughing.  You have been prescribed a cough medication.  Urine was completely clear.  Suspect that your back pain is muscular in nature.  You have been prescribed a lidocaine patch to apply directly to area.  Also recommend Tylenol as needed.  Suspicious that your stomach pain is from gastritis caused possibly by prednisone.  Carafate which is a medication to help alleviate stomach discomfort has been prescribed for you to take 4 times daily.  Please follow-up if symptoms persist or worsen.    ED Prescriptions     Medication Sig Dispense Auth. Provider   sucralfate (CARAFATE) 1 g tablet Take 1 tablet (1 g total) by mouth 4 (four) times daily -  with meals and at bedtime. 120 tablet [place], [place] E, [place]   benzonatate (TESSALON) 100 MG capsule Take 1 capsule (100 mg total) by mouth every 8 (eight) hours as needed for cough. 21 capsule [place], [place] E, [place]   lidocaine (LIDODERM) 5 % Place 1 patch onto the skin daily. Remove & Discard patch within 12 hours or as directed by MD 30 patch [place], Michele Rockers, FNP      PDMP not reviewed this encounter.   Teodora Medici, [place] 06/19/22 [place], [place], [place] 06/19/22 1724

## 2022-06-19 NOTE — Discharge Instructions (Addendum)
Chest x-ray was not any different than your baseline.  Suspect rib pain is from forceful coughing.  You have been prescribed a cough medication.  Urine was completely clear.  Suspect that your back pain is muscular in nature.  You have been prescribed a lidocaine patch to apply directly to area.  Also recommend Tylenol as needed.  Suspicious that your stomach pain is from gastritis caused possibly by prednisone.  Carafate which is a medication to help alleviate stomach discomfort has been prescribed for you to take 4 times daily.  Please follow-up if symptoms persist or worsen.

## 2022-06-19 NOTE — ED Triage Notes (Signed)
Pt presents to uc with co of pleurisy and flank pain. Pt st she was recently diagnosed with copd and was prescribed an inhaler, has since stopped smoking but continues to feel sob and tightness. Pt reports pmh of kidney stones and some dysuria and flank pain as well.

## 2022-06-21 ENCOUNTER — Other Ambulatory Visit: Payer: Self-pay | Admitting: Family Medicine

## 2022-06-21 ENCOUNTER — Other Ambulatory Visit: Payer: Self-pay | Admitting: Cardiovascular Disease

## 2022-06-21 DIAGNOSIS — E782 Mixed hyperlipidemia: Secondary | ICD-10-CM

## 2022-06-21 DIAGNOSIS — I341 Nonrheumatic mitral (valve) prolapse: Secondary | ICD-10-CM

## 2022-06-21 DIAGNOSIS — J449 Chronic obstructive pulmonary disease, unspecified: Secondary | ICD-10-CM

## 2022-06-21 DIAGNOSIS — R202 Paresthesia of skin: Secondary | ICD-10-CM

## 2022-06-21 DIAGNOSIS — S92901S Unspecified fracture of right foot, sequela: Secondary | ICD-10-CM

## 2022-06-21 DIAGNOSIS — G8929 Other chronic pain: Secondary | ICD-10-CM

## 2022-06-22 ENCOUNTER — Other Ambulatory Visit: Payer: Self-pay | Admitting: Family Medicine

## 2022-06-22 DIAGNOSIS — F419 Anxiety disorder, unspecified: Secondary | ICD-10-CM

## 2022-06-25 DIAGNOSIS — J449 Chronic obstructive pulmonary disease, unspecified: Secondary | ICD-10-CM | POA: Diagnosis not present

## 2022-06-25 DIAGNOSIS — R32 Unspecified urinary incontinence: Secondary | ICD-10-CM | POA: Diagnosis not present

## 2022-06-27 ENCOUNTER — Encounter: Payer: Self-pay | Admitting: Internal Medicine

## 2022-06-28 LAB — PULMONARY FUNCTION TEST
DL/VA % pred: 72 %
DL/VA: 3.15 ml/min/mmHg/L
DLCO cor % pred: 42 %
DLCO cor: 7.87 ml/min/mmHg
DLCO unc % pred: 42 %
DLCO unc: 7.87 ml/min/mmHg
FEF 25-75 Pre: 1.1 L/sec
FEF2575-%Pred-Pre: 46 %
FEV1-%Pred-Pre: 40 %
FEV1-Pre: 0.98 L
FEV1FVC-%Pred-Pre: 104 %
FEV6-%Pred-Pre: 39 %
FEV6-Pre: 1.19 L
FEV6FVC-%Pred-Pre: 103 %
FVC-%Pred-Pre: 38 %
FVC-Pre: 1.19 L
Pre FEV1/FVC ratio: 83 %
Pre FEV6/FVC Ratio: 100 %

## 2022-06-30 DIAGNOSIS — J849 Interstitial pulmonary disease, unspecified: Secondary | ICD-10-CM | POA: Diagnosis not present

## 2022-07-01 IMAGING — MR MR HEAD W/O CM
12 series · 48 of 48 positions shown · non-contrast
Comparison: Comparison made with prior CT from 11/11/2018.

CLINICAL DATA: Initial evaluation for chronic headaches, new
features or increased frequency.

EXAM:
MRI HEAD WITHOUT CONTRAST
MRA HEAD WITHOUT CONTRAST
TECHNIQUE: Multiplanar, multi-echo pulse sequences of the brain and surrounding
structures were acquired without intravenous contrast. Angiographic
images of the Circle of Willis were acquired using MRA technique
without intravenous contrast.

[Series 5: T1 · sagittal · 4.0mm · 0.75mm/px · 1 of 30 slices shown (1 of 2)]
[im 1/30]
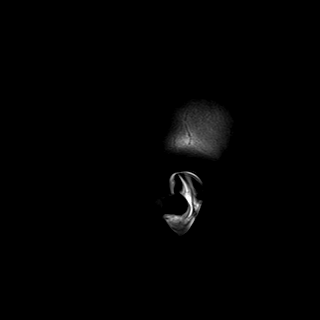

[Series 6: DWI · axial · 3.0mm · 0.94mm/px · z∈[-89,+59]mm · 10 of 168 slices shown (1 of 3)]
[im 1/168]
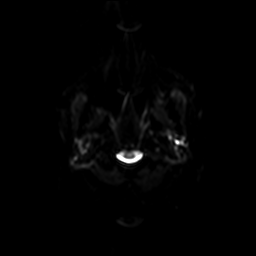
[im 19/168]
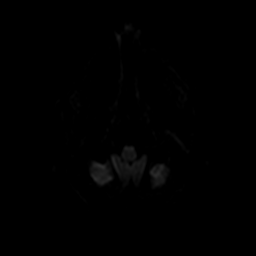
[im 38/168]
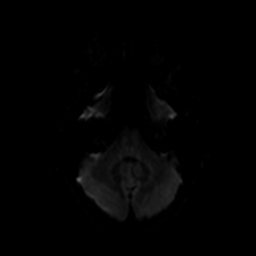
[im 56/168]
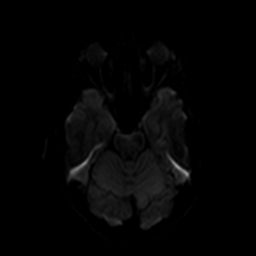
[im 75/168]
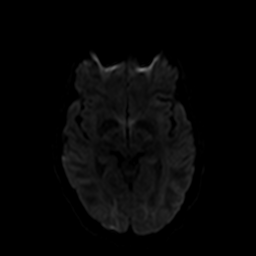
[im 93/168]
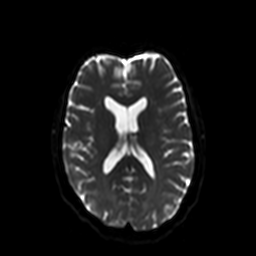
[im 112/168]
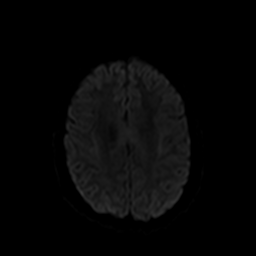
[im 130/168]
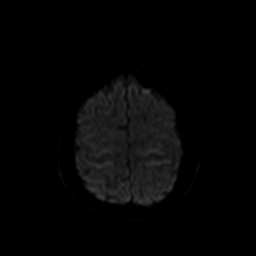
[im 149/168]
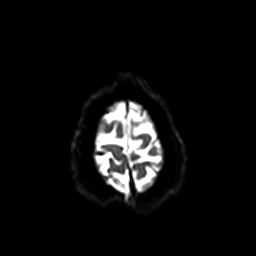
[im 168/168]
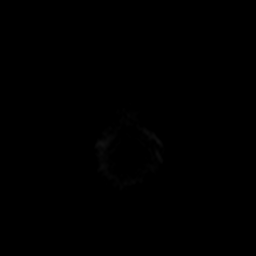

[Series 7: ax dwi_tracew · axial · 3.0mm · 0.94mm/px · z∈[-89,+59]mm · 5 of 84 slices shown]
[im 1/84]
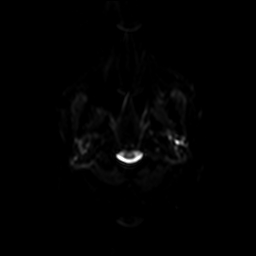
[im 21/84]
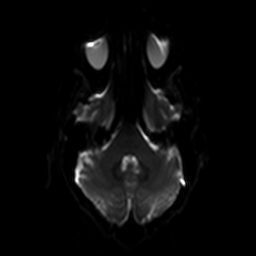
[im 42/84]
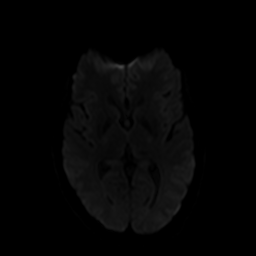
[im 63/84]
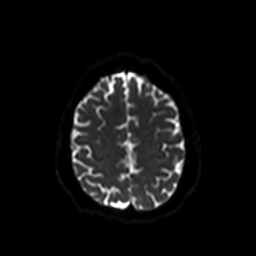
[im 84/84]
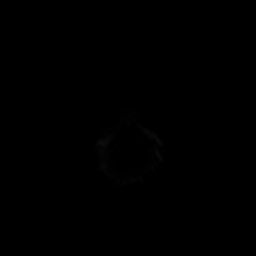

[Series 8: ax dwi_adc · axial · 3.0mm · 0.94mm/px · z∈[-89,+59]mm · 2 of 41 slices shown]
[im 1/41]
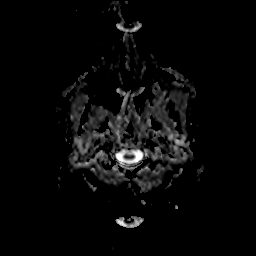
[im 41/41]
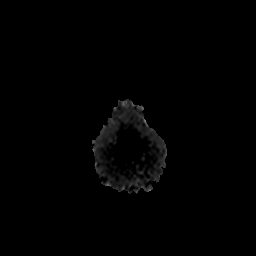

[Series 9: DWI · coronal · 5.0mm · 1.44mm/px · 4 of 62 slices shown (2 of 3)]
[im 1/62]
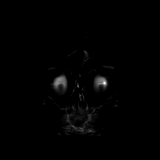
[im 21/62]
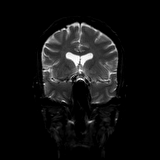
[im 41/62]
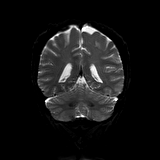
[im 62/62]
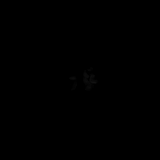

[Series 10: DWI · coronal · 5.0mm · 1.44mm/px · 2 of 31 slices shown (3 of 3)]
[im 1/31]
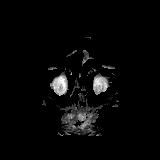
[im 31/31]
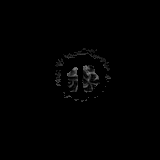

[Series 11: T2 · axial · 4.0mm · 0.36mm/px · z∈[-98,+47]mm · 2 of 28 slices shown (1 of 2)]
[im 1/28]
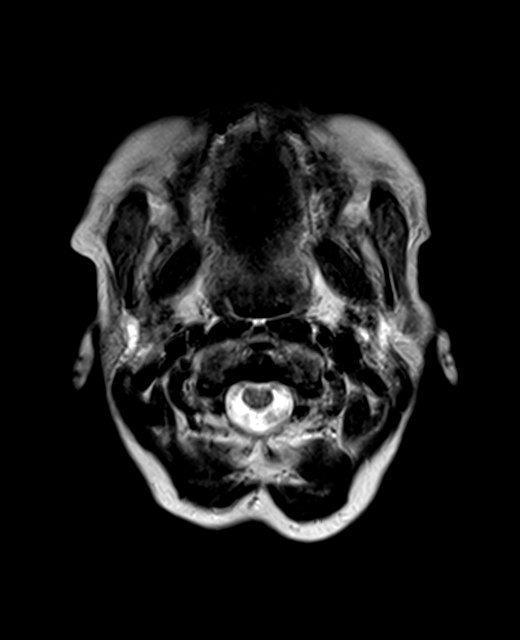
[im 28/28]
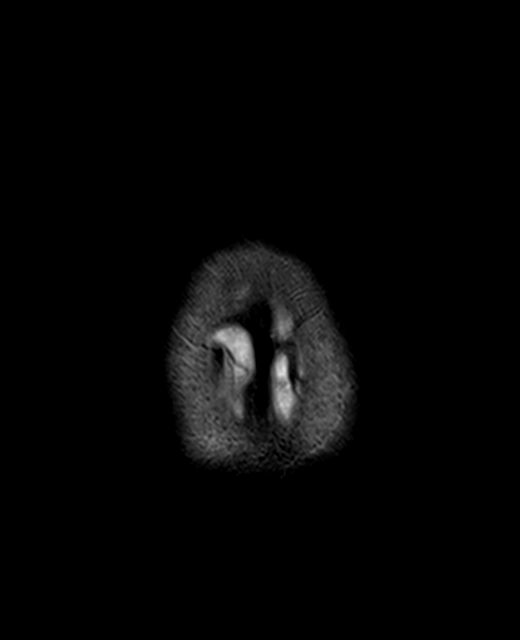

[Series 12: FLAIR · axial · 3.0mm · 0.72mm/px · 1 of 26 slices shown]
[im 1/26]
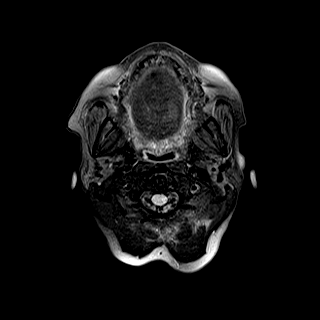

[Series 13: swi_images · axial · 1.5mm · 0.90mm/px · z∈[-97,+46]mm · 5 of 96 slices shown]
[im 1/96]
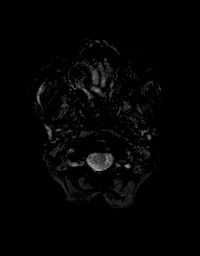
[im 24/96]
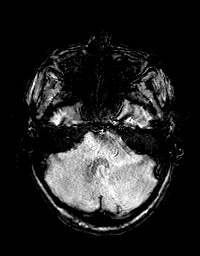
[im 48/96]
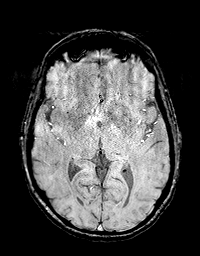
[im 72/96]
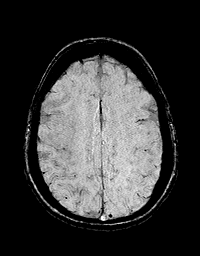
[im 96/96]
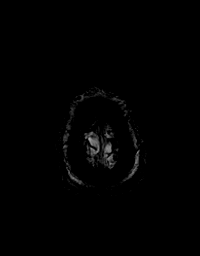

[Series 14: mip_images(sw) · axial · 12.0mm · 0.90mm/px · z∈[-92,+40]mm · 5 of 89 slices shown]
[im 1/89]
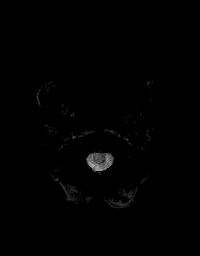
[im 23/89]
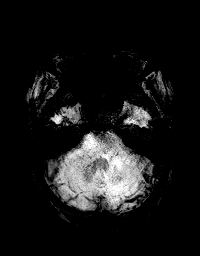
[im 45/89]
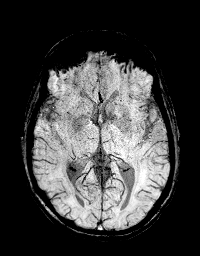
[im 67/89]
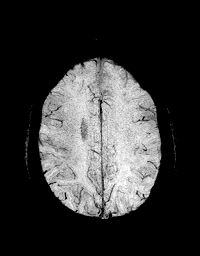
[im 89/89]
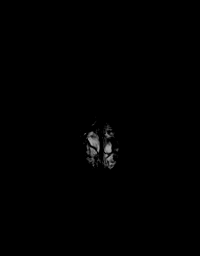

[Series 15: T1 · axial · 1.0mm · 0.94mm/px · z∈[-105,+54]mm · 9 of 160 slices shown (2 of 2)]
[im 1/160]
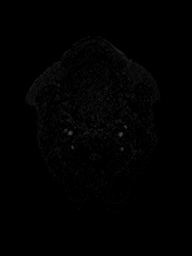
[im 20/160]
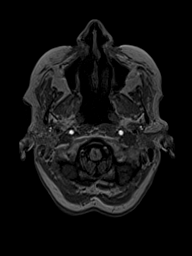
[im 40/160]
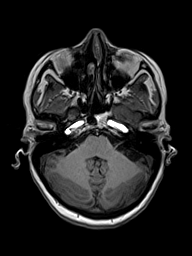
[im 60/160]
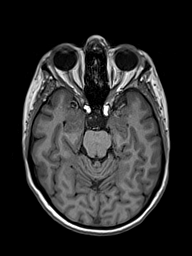
[im 80/160]
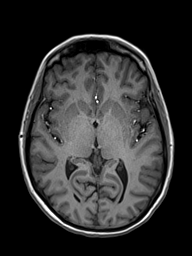
[im 100/160]
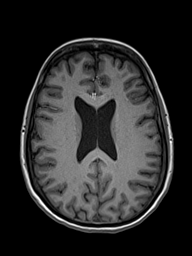
[im 120/160]
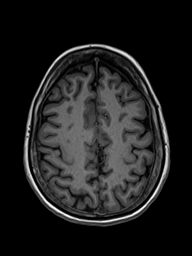
[im 140/160]
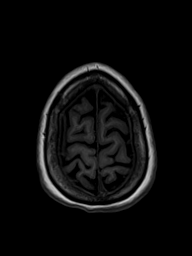
[im 160/160]
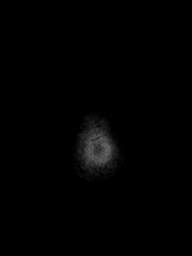

[Series 16: T2 · coronal · 4.5mm · 0.36mm/px · 2 of 30 slices shown (2 of 2)]
[im 1/30]
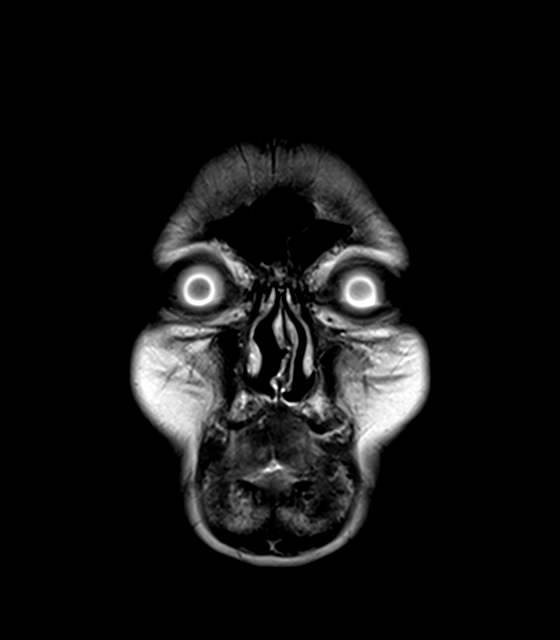
[im 30/30]
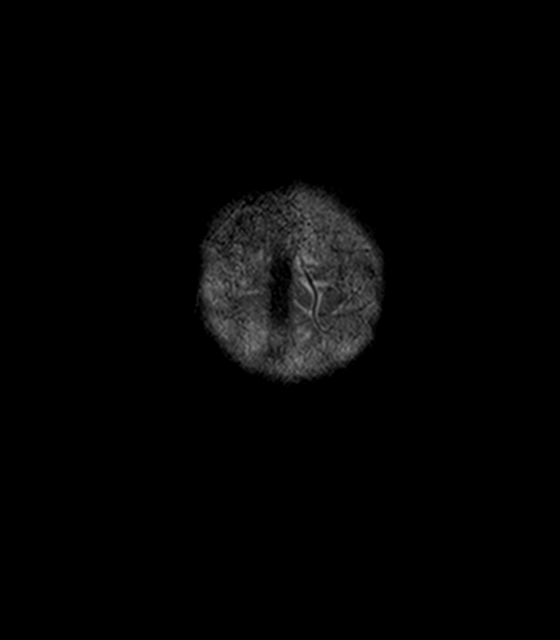

[48 of 48 positions shown; findings below may reference images not displayed]

FINDINGS: MRI HEAD FINDINGS

Brain: Cerebral volume within normal limits. No focal parenchymal
signal abnormality or significant cerebral white matter disease. No
abnormal foci of restricted diffusion to suggest acute or subacute
ischemia. Gray-white matter differentiation maintained. No
encephalomalacia to suggest chronic cortical infarction. No acute or
chronic intracranial blood products.

No mass lesion, midline shift or mass effect. No hydrocephalus or
extra-axial fluid collection. Pituitary gland within normal limits.
Suprasellar region normal. Midline structures intact.

Vascular: Major intracranial vascular flow voids are maintained.

Skull and upper cervical spine: Craniocervical junction within
normal limits. Bone marrow signal intensity normal. No scalp soft
tissue abnormality.

Sinuses/Orbits: Globes and orbital soft tissues within normal
limits. Minimal mucosal thickening noted within the sphenoid
sinuses. Paranasal sinuses are otherwise clear. No mastoid effusion.
Inner ear structures grossly normal.

Other: Few scattered subcentimeter T2 hyperintense lesions noted
within the parotid glands bilaterally, nonspecific, but of doubtful
significance.

MRA HEAD FINDINGS

Anterior circulation: Visualized distal cervical segments of the
internal carotid arteries are widely patent with antegrade flow.
Petrous, cavernous, and supraclinoid segments patent without
stenosis or other abnormality. Origins of the ophthalmic arteries
patent and normal. ICA termini well perfused. A1 segments patent
bilaterally. Normal anterior communicating artery complex. Anterior
cerebral arteries widely patent without stenosis. No M1 stenosis or
occlusion. Normal MCA bifurcations. Distal MCA branches well
perfused and symmetric.

Posterior circulation: Vertebral arteries are largely codominant and
widely patent to the vertebrobasilar junction. Both PICA origins
patent and normal. Basilar widely patent to its distal aspect
without stenosis. Superior cerebellar arteries patent bilaterally.
Both PCAs primarily supplied via the basilar well perfused or distal
aspects.

Anatomic variants: None significant. No aneurysm or other vascular
abnormality.
IMPRESSION: 1. Normal brain MRI. No acute intracranial abnormality or findings
to explain patient's symptoms identified.
2. Normal intracranial MRA.  No aneurysm.

## 2022-07-22 ENCOUNTER — Other Ambulatory Visit: Payer: Self-pay | Admitting: Family Medicine

## 2022-07-22 DIAGNOSIS — G8929 Other chronic pain: Secondary | ICD-10-CM

## 2022-07-25 DIAGNOSIS — R32 Unspecified urinary incontinence: Secondary | ICD-10-CM | POA: Diagnosis not present

## 2022-07-25 DIAGNOSIS — J449 Chronic obstructive pulmonary disease, unspecified: Secondary | ICD-10-CM | POA: Diagnosis not present

## 2022-08-08 NOTE — Progress Notes (Addendum)
Subjective:    [name] - 56 y.o. female MRN [id number]  Date of birth: [birth year]  HPI  [name] is a 56 year-old female to establish care.  Current issues and/or concerns: - Reports she had dentures in the past. States dentist told her that the dentures were too large due to her having a small mouth and recommended dental implants. Requests referral for consultation of dental implants.  - History of urinary incontinence. Myrbetriq does not help. Requests referral to specialist.  - Needs refills of Omeprazole for acid reflux.  - Reports experiencing fatigue due to not getting adequate rest. 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 Tried Vitamin B12 injections in the past and helped. - Reports she is established with an online psychiatrist and sessions going well. Taking prescribed Wellbutrin and Buspar.  - Established with Pulmonology for management of COPD and interstitial lung disease.  - Established with Cardiology for management of mitral valve prolapse and hyperlipidemia.  - Reports was established with Neurology for management of migraines and paresthesias. Requests new referral to Neurology due to having unpleasant patient experience with previous neurologist.     ROS per HPI    Health Maintenance:  There are no preventive care reminders to display for this patient.   Past Medical History: Patient Active Problem List   Diagnosis Date Noted   Urinary incontinence 04/09/2022   ILD (interstitial lung disease) ([place]) 06/27/2021   Unstable angina ([place]) 04/13/2021   Atypical nevi 03/17/2021   Atherosclerosis 03/08/2021   Grief reaction 03/02/2021   Acute diarrhea 12/29/2020   Acute right lumbar radiculopathy 12/11/2020   Healthcare maintenance 12/11/2020   Acute lumbar back pain 07/25/2020   COPD (chronic obstructive pulmonary disease) ([place]) 06/01/2020   GAD (generalized anxiety disorder) 12/26/2019   Poor social situation 08/29/2019   Abnormal chest CT 08/29/2019    Poor dentition 06/25/2019   Lung nodule, solitary 06/25/2019   Chronic cough 11/26/2018   Recent unintentional weight loss over several months 11/05/2018   Current smoker 11/05/2018   Paresthesia 07/22/2018   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 She has a 19.00 pack-year smoking history. She has been exposed to tobacco smoke. She has never used smokeless tobacco. She reports current alcohol use. She reports that she does not use drugs.   Family History  family history includes Alcohol abuse in her father; Colon cancer in her maternal uncle; Colon polyps in her mother; Diabetes in her maternal grandfather, maternal grandmother, and mother; Heart disease in her brother, mother, and sister; Hyperlipidemia in her brother, mother, and sister; Stomach cancer in her maternal uncle; Tuberculosis in her father.   Medications: reviewed and updated   Objective:   Physical Exam BP 112/61 (BP Location: Left Arm, Patient Position: Sitting, Cuff Size: Normal)   Pulse 74   Temp 98.3 F (36.8 C)   Resp 16   Ht 5' 0.63" (1.54 m)   Wt 108 lb 14.4 oz (49.4 kg)   SpO2 90%   BMI 20.83 kg/m   Physical Exam HENT:     Head: Normocephalic and atraumatic.     Mouth/Throat:     Comments: Edentulous.  Eyes:     Extraocular Movements: Extraocular movements intact.     Conjunctiva/sclera: Conjunctivae normal.     Pupils: Pupils are equal, round, and reactive  to light.  Cardiovascular:     Rate and Rhythm: Normal rate and regular rhythm.     Pulses: Normal pulses.     Heart sounds: Normal heart sounds.  Pulmonary:     Effort: Pulmonary effort is normal.     Breath sounds: Normal breath sounds.  Musculoskeletal:     Cervical back: Normal range of motion and neck supple.  Neurological:     General: No focal deficit present.      Mental Status: She is alert and oriented to person, place, and time.  Psychiatric:        Mood and Affect: Mood normal.        Behavior: Behavior normal.      Assessment & Plan:  1. Encounter to establish care - Patient presents today to establish care.  - Return for annual physical examination, labs, and health maintenance. Arrive fasting meaning having no food for at least 8 hours prior to appointment. You may have only water or black coffee. Please take scheduled medications as normal.  2. Edentulous - Referral to Periodontics for further evaluation and management.  - Ambulatory referral to Periodontics  3. Urinary incontinence, unspecified type - Referral to Urogynecology for further evaluation and management.  - Ambulatory referral to Urogynecology  4. Gastroesophageal reflux disease, unspecified whether esophagitis present - Continue Omeprazole as prescribed.  - Follow-up with primary provider as scheduled.  - omeprazole (PRILOSEC) 20 MG capsule; Take 1 capsule (20 mg total) by mouth daily.  Dispense: 30 capsule; Refill: 2  5. Encounter for vitamin deficiency screening - Routine screening.  - Vitamin B12 - Vitamin D, 25-hydroxy  6. Chronic obstructive pulmonary disease, unspecified COPD type ([place]) 7. ILD (interstitial lung disease) ([place]) - Keep all scheduled appointments with Pulmonology.  8. Mitral valve prolapse 9. Hyperlipidemia, unspecified hyperlipidemia type - Keep all scheduled appointments with Cardiology.   10. 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 Dizziness 13. Paresthesia - Referral to Neurology for further evaluation and management.  - Ambulatory referral to Neurology   Patient was given clear instructions to go to Emergency Department or return to medical center if symptoms don't improve, worsen, or new problems develop.The patient verbalized understanding.  I discussed the  assessment and treatment plan with the patient. The patient was provided an opportunity to ask questions and all were answered. The patient agreed with the plan and demonstrated an understanding of the instructions.   The patient was advised to call back or seek an in-person evaluation if the symptoms worsen or if the condition fails to improve as anticipated.    Durene Fruits, NP 08/18/2022, 3:30 PM Primary Care at [facility]

## 2022-08-09 ENCOUNTER — Ambulatory Visit: Payer: Medicaid Other | Admitting: Neurology

## 2022-08-17 ENCOUNTER — Ambulatory Visit
Admission: RE | Admit: 2022-08-17 | Discharge: 2022-08-17 | Disposition: A | Discharge status: Home / Self Care | Payer: Medicaid Other | Source: Ambulatory Visit | Attending: Internal Medicine | Admitting: Internal Medicine

## 2022-08-17 DIAGNOSIS — J479 Bronchiectasis, uncomplicated: Secondary | ICD-10-CM | POA: Diagnosis not present

## 2022-08-17 DIAGNOSIS — J849 Interstitial pulmonary disease, unspecified: Secondary | ICD-10-CM

## 2022-08-17 DIAGNOSIS — J432 Centrilobular emphysema: Secondary | ICD-10-CM | POA: Diagnosis not present

## 2022-08-17 DIAGNOSIS — J8410 Pulmonary fibrosis, unspecified: Secondary | ICD-10-CM | POA: Diagnosis not present

## 2022-08-17 DIAGNOSIS — J449 Chronic obstructive pulmonary disease, unspecified: Secondary | ICD-10-CM

## 2022-08-18 ENCOUNTER — Ambulatory Visit: Payer: Medicaid Other | Admitting: Family

## 2022-08-18 ENCOUNTER — Other Ambulatory Visit: Payer: Self-pay | Admitting: Primary Care

## 2022-08-18 ENCOUNTER — Encounter: Payer: Self-pay | Admitting: Family

## 2022-08-18 ENCOUNTER — Other Ambulatory Visit: Payer: Self-pay | Admitting: Family Medicine

## 2022-08-18 VITALS — BP 112/61 | HR 74 | Temp 98.3°F | Resp 16 | Ht 60.63 in | Wt 108.9 lb

## 2022-08-18 DIAGNOSIS — R42 Dizziness and giddiness: Secondary | ICD-10-CM

## 2022-08-18 DIAGNOSIS — R202 Paresthesia of skin: Secondary | ICD-10-CM

## 2022-08-18 DIAGNOSIS — J449 Chronic obstructive pulmonary disease, unspecified: Secondary | ICD-10-CM | POA: Diagnosis not present

## 2022-08-18 DIAGNOSIS — K219 Gastro-esophageal reflux disease without esophagitis: Secondary | ICD-10-CM

## 2022-08-18 DIAGNOSIS — K08109 Complete loss of teeth, unspecified cause, unspecified class: Secondary | ICD-10-CM

## 2022-08-18 DIAGNOSIS — G43009 Migraine without aura, not intractable, without status migrainosus: Secondary | ICD-10-CM

## 2022-08-18 DIAGNOSIS — R32 Unspecified urinary incontinence: Secondary | ICD-10-CM | POA: Diagnosis not present

## 2022-08-18 DIAGNOSIS — G4750 Parasomnia, unspecified: Secondary | ICD-10-CM | POA: Diagnosis not present

## 2022-08-18 DIAGNOSIS — E785 Hyperlipidemia, unspecified: Secondary | ICD-10-CM | POA: Diagnosis not present

## 2022-08-18 DIAGNOSIS — J849 Interstitial pulmonary disease, unspecified: Secondary | ICD-10-CM

## 2022-08-18 DIAGNOSIS — Z7689 Persons encountering health services in other specified circumstances: Secondary | ICD-10-CM | POA: Diagnosis not present

## 2022-08-18 DIAGNOSIS — Z1321 Encounter for screening for nutritional disorder: Secondary | ICD-10-CM

## 2022-08-18 DIAGNOSIS — G8929 Other chronic pain: Secondary | ICD-10-CM

## 2022-08-18 DIAGNOSIS — R053 Chronic cough: Secondary | ICD-10-CM

## 2022-08-18 DIAGNOSIS — I341 Nonrheumatic mitral (valve) prolapse: Secondary | ICD-10-CM

## 2022-08-18 MED ORDER — OMEPRAZOLE 20 MG PO CPDR: 20.0000 mg | DELAYED_RELEASE_CAPSULE | Freq: Every day | ORAL | 2 refills | Status: DC

## 2022-08-18 NOTE — Progress Notes (Signed)
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

## 2022-08-19 ENCOUNTER — Other Ambulatory Visit: Payer: Self-pay | Admitting: Family

## 2022-08-19 DIAGNOSIS — E559 Vitamin D deficiency, unspecified: Secondary | ICD-10-CM | POA: Insufficient documentation

## 2022-08-19 LAB — VITAMIN D 25 HYDROXY (VIT D DEFICIENCY, FRACTURES): Vit D, 25-Hydroxy: 21.4 ng/mL — ABNORMAL LOW (ref 30.0–100.0)

## 2022-08-19 LAB — VITAMIN B12: Vitamin B-12: 716 pg/mL (ref 232–1245)

## 2022-08-19 MED ORDER — VITAMIN D (ERGOCALCIFEROL) 1.25 MG (50000 UNIT) PO CAPS: 50000.0000 [IU] | ORAL_CAPSULE | ORAL | 2 refills | Status: DC

## 2022-08-21 ENCOUNTER — Telehealth: Payer: Self-pay | Admitting: Family

## 2022-08-21 NOTE — Telephone Encounter (Signed)
Copied from [place] [id number]. Topic: General - Other >> Aug 21, 2022  1:50 PM Ludger Nutting wrote: [name] with [facility] called on behalf of the patient to see if provider would register so the patient could do genesight testing for medication management. Please follow up.

## 2022-08-23 ENCOUNTER — Encounter: Payer: Self-pay | Admitting: Family

## 2022-08-25 ENCOUNTER — Ambulatory Visit: Payer: Medicaid Other | Admitting: Primary Care

## 2022-08-25 ENCOUNTER — Other Ambulatory Visit: Payer: Self-pay | Admitting: Acute Care

## 2022-08-25 ENCOUNTER — Ambulatory Visit: Payer: Medicaid Other | Admitting: Internal Medicine

## 2022-08-25 ENCOUNTER — Telehealth: Payer: Self-pay | Admitting: Internal Medicine

## 2022-08-25 DIAGNOSIS — R051 Acute cough: Secondary | ICD-10-CM

## 2022-08-25 MED ORDER — MOLNUPIRAVIR EUA 200MG CAPSULE: 4.0000 | ORAL_CAPSULE | Freq: Two times a day (BID) | ORAL | 0 refills | Status: AC

## 2022-08-25 MED ORDER — HYDROCODONE BIT-HOMATROP MBR 5-1.5 MG/5ML PO SOLN: 5.0000 mL | Freq: Every evening | ORAL | 0 refills | Status: DC | PRN

## 2022-08-25 NOTE — Telephone Encounter (Signed)
Pt called the office stating that she tested positive for Covid today. Stated that she started feeling sick 4 days ago. Symptoms have included cough, runny nose, head congestion. Pt is not sure if she has had a temp as she has not taken her temperature.  Pt stated that when she coughs, she is not able to cough up any phlegm. Pt states that her breathing is about the same as before as she does have COPD, does not say that her breathing is any worse.  Pt said her main issue is that she is coughing a lot and stated that she is not able to sleep due to all the coughing she is doing.   Pt is wanting to know if something could be prescribed to help with her symptoms especially since she did test positive for covid. [name], please advise.  Pharmacy is [facility] [address]

## 2022-08-25 NOTE — Telephone Encounter (Signed)
OK to send in a prescription for molnupiravir. Since she is day 4 of symptoms as long as she starts this today, she is within guidelines. Please send in Rx.  I will send in a RX for hydromet for cough at bedtime. 5 cc at bedtime as needed for cough. This is for bedtime only.She needs to avoid driving if she is sleepy.  She needs to take symptomatic relief, drink plenty of fluids , and seek emergency care if she gets worse.  She needs to wear mask and self isolate to prevent spread, frequent hand washing.  She needs to be seen in the office  by her practice MD in 3 weeks to ensure     Spoke with pt and reviewed [name]'s recommendation including medication instructions. Pt advised to seek emergent medical evaluation if she gets worse. Pt scheduled for OV with BW on 09/15/22. Nothing further needed at this time.

## 2022-08-28 ENCOUNTER — Ambulatory Visit: Payer: Self-pay | Admitting: *Deleted

## 2022-08-28 NOTE — Telephone Encounter (Signed)
  Chief Complaint: constipated Symptoms: no bm 3 days Frequency: na Pertinent Negatives: Patient denies blood in stool Disposition: '[]'$ ED /'[]'$ Urgent Care (no appt availability in office) / '[]'$ Appointment(In office/virtual)/ '[]'$  [facility]/ '[]'$ Home Care/ '[]'$ Refused Recommended Disposition /'[]'$ [facility]/ '[x]'$  Follow-up with PCP Additional Notes: Mag Citrate suggested as well as home care instructions, pt to call back if Mag Citrate does not work.   Reason for Disposition  [1] Uses laxative (e.g., PEG / Miralax, Milk of Magnesia) or enema AND [2] more than once a month  Answer Assessment - Initial Assessment Questions 1. STOOL PATTERN OR FREQUENCY: "How often do you have a bowel movement (BM)?"  (Normal range: 3 times a day to every 3 days)  "When was your last BM?"       3 days ago with an enema only small amount 2. STRAINING: "Do you have to strain to have a BM?"      yes 3. RECTAL PAIN: "Does your rectum hurt when the stool comes out?" If Yes, ask: "Do you have hemorrhoids? How bad is the pain?"  (Scale 1-10; or mild, moderate, severe)     no 4. STOOL COMPOSITION: "Are the stools hard?"      hard 5. BLOOD ON STOOLS: "Has there been any blood on the toilet tissue or on the surface of the BM?" If Yes, ask: "When was the last time?"     no 6. CHRONIC CONSTIPATION: "Is this a new problem for you?"  If No, ask: "How long have you had this problem?" (days, weeks, months)      no 7. CHANGES IN DIET OR HYDRATION: "Have there been any recent changes in your diet?" "How much fluids are you drinking on a daily basis?"  "How much have you had to drink today?"     na 8. MEDICINES: "Have you been taking any new medicines?" "Are you taking any narcotic pain medicines?" (e.g., Dilaudid, morphine, Percocet, Vicodin)     Molnupiravir 9. LAXATIVES: "Have you been using any stool softeners, laxatives, or enemas?"  If Yes, ask "What, how often, and when was the last time?"     Taking  Miralax  Protocols used: Constipation-A-AH

## 2022-08-29 ENCOUNTER — Telehealth: Payer: Self-pay | Admitting: Family

## 2022-08-29 ENCOUNTER — Ambulatory Visit: Payer: Self-pay | Admitting: *Deleted

## 2022-08-29 NOTE — Telephone Encounter (Signed)
Caller hung up before reaching NT. NT called patient back and did not reach patient. Unable to leave message due to VM full. Patient caller for pain and constipation and reported to agent has tried multiple OTC without success.   Reason for Disposition  Caller has cancelled the call before the first contact  Answer Assessment - Initial Assessment Questions N/A Patient called and call disconnected. C/o constipation and pain per agent.  Protocols used: No Contact or Duplicate Contact Call-A-AH

## 2022-08-29 NOTE — Telephone Encounter (Signed)
Caller states they have received referral for implants but they do not provide implant service due to being a periodontics office and do not accept new pt adult medicaid  Caller states implants is not a medicaid covered service and pt will need to be referred to a general dentist

## 2022-09-02 ENCOUNTER — Other Ambulatory Visit: Payer: Self-pay | Admitting: Family

## 2022-09-02 DIAGNOSIS — E559 Vitamin D deficiency, unspecified: Secondary | ICD-10-CM

## 2022-09-04 NOTE — Telephone Encounter (Signed)
Requested medication (s) are due for refill today: yes  Requested medication (s) are on the active medication list: yes  Last refill:  08/19/22  Future visit scheduled: yes  Notes to clinic:   Manual Review: Route requests for 50,000 IU strength to the provider      Requested Prescriptions  Pending Prescriptions Disp Refills   Vitamin D, Ergocalciferol, (DRISDOL) 1.25 MG (50000 UNIT) CAPS capsule [Pharmacy Med Name: VITAMIN D2 1.'25MG'$ (50,000 UNIT)] 12 capsule     Sig: Take 1 capsule (50,000 Units total) by mouth every 7 (seven) days for 12 doses.     Endocrinology:  Vitamins - Vitamin D Supplementation 2 Failed - 09/02/2022 10:07 AM      Failed - Manual Review: Route requests for 50,000 IU strength to the provider      Failed - Vitamin D in normal range and within 360 days    Vit D, 25-Hydroxy  Date Value Ref Range Status  08/18/2022 21.4 (L) 30.0 - 100.0 ng/mL Final    Comment:    Vitamin D deficiency has been defined by the [place] practice guideline as a level of serum 25-OH vitamin D less than 20 ng/mL (1,2). The Endocrine Society went on to further define vitamin D insufficiency as a level between 21 and 29 ng/mL (2). 1. IOM (Institute of Medicine). 2010. Dietary reference    intakes for calcium and D. [place]: The    [employer]. 2. Holick MF, Binkley [place], Bischoff-Ferrari HA, et al.    Evaluation, treatment, and prevention of vitamin D    deficiency: an Endocrine Society clinical practice    guideline. JCEM. 2011 Jul; 96(7):1911-30.          Passed - Ca in normal range and within 360 days    Calcium  Date Value Ref Range Status  02/28/2022 10.1 8.7 - 10.2 mg/dL Final   Calcium, Ion  Date Value Ref Range Status  02/13/2011 1.14 1.12 - 1.32 mmol/L Final         Passed - Valid encounter within last 12 months    Recent Outpatient Visits           2 weeks ago Encounter to establish care   Primary Care at  [facility], [place], NP   9 years ago Low back pain   [place] [facility], Cammie Mcgee, MD   9 years ago [place], Cammie Mcgee, MD       Future Appointments             In 1 week Martyn Ehrich, NP [facility]

## 2022-09-05 ENCOUNTER — Ambulatory Visit: Payer: Medicaid Other | Admitting: Cardiovascular Disease

## 2022-09-07 DIAGNOSIS — R32 Unspecified urinary incontinence: Secondary | ICD-10-CM | POA: Diagnosis not present

## 2022-09-07 DIAGNOSIS — J449 Chronic obstructive pulmonary disease, unspecified: Secondary | ICD-10-CM | POA: Diagnosis not present

## 2022-09-15 ENCOUNTER — Ambulatory Visit: Payer: Medicaid Other | Admitting: Primary Care

## 2022-10-03 ENCOUNTER — Other Ambulatory Visit: Payer: Self-pay | Admitting: Family Medicine

## 2022-10-03 DIAGNOSIS — G8929 Other chronic pain: Secondary | ICD-10-CM

## 2022-10-04 NOTE — Telephone Encounter (Signed)
Order complete. 

## 2022-10-26 NOTE — Progress Notes (Signed)
Erroneous encounter-disregard

## 2022-10-31 ENCOUNTER — Encounter: Payer: Medicaid Other | Admitting: Family

## 2022-10-31 DIAGNOSIS — Z0000 Encounter for general adult medical examination without abnormal findings: Secondary | ICD-10-CM

## 2022-10-31 DIAGNOSIS — Z130 Encounter for screening for diseases of the blood and blood-forming organs and certain disorders involving the immune mechanism: Secondary | ICD-10-CM

## 2022-10-31 DIAGNOSIS — Z113 Encounter for screening for infections with a predominantly sexual mode of transmission: Secondary | ICD-10-CM

## 2022-10-31 DIAGNOSIS — Z1329 Encounter for screening for other suspected endocrine disorder: Secondary | ICD-10-CM

## 2022-10-31 DIAGNOSIS — Z1231 Encounter for screening mammogram for malignant neoplasm of breast: Secondary | ICD-10-CM

## 2022-10-31 DIAGNOSIS — Z124 Encounter for screening for malignant neoplasm of cervix: Secondary | ICD-10-CM

## 2022-10-31 DIAGNOSIS — Z131 Encounter for screening for diabetes mellitus: Secondary | ICD-10-CM

## 2022-10-31 DIAGNOSIS — Z13228 Encounter for screening for other metabolic disorders: Secondary | ICD-10-CM

## 2022-11-02 DIAGNOSIS — R32 Unspecified urinary incontinence: Secondary | ICD-10-CM | POA: Diagnosis not present

## 2022-11-02 DIAGNOSIS — J449 Chronic obstructive pulmonary disease, unspecified: Secondary | ICD-10-CM | POA: Diagnosis not present

## 2022-11-15 ENCOUNTER — Other Ambulatory Visit: Payer: Self-pay | Admitting: Acute Care

## 2022-11-15 DIAGNOSIS — R051 Acute cough: Secondary | ICD-10-CM

## 2022-11-15 MED ORDER — HYDROCODONE BIT-HOMATROP MBR 5-1.5 MG/5ML PO SOLN: 5.0000 mL | Freq: Every evening | ORAL | 0 refills | Status: DC | PRN

## 2022-11-21 NOTE — Progress Notes (Deleted)
Patient ID: [name], female    DOB: 06-30-1966  MRN: [id number]  CC: No chief complaint on file.   Subjective: [name] is a 57 y.o. female who presents for  Her concerns today include:  pt was offered earlier appt available 11/15/21 w/ [facility], Pt preferred 11/22/21 w/ pcp to be seen for ear infection, and cough, 102 temperature, Hx of COPD stated pt.  Periodontics  Urogynecology  Pulmonology  Cardiology  Neurology   Patient Active Problem List   Diagnosis Date Noted   Vitamin D deficiency 08/19/2022   Urinary incontinence 04/09/2022   ILD (interstitial lung disease) ([place]) 06/27/2021   Unstable angina ([place]) 04/13/2021   Atypical nevi 03/17/2021   Atherosclerosis 03/08/2021   Grief reaction 03/02/2021   Acute diarrhea 12/29/2020   Acute right lumbar radiculopathy 12/11/2020   Healthcare maintenance 12/11/2020   Acute lumbar back pain 07/25/2020   COPD (chronic obstructive pulmonary disease) ([place]) 06/01/2020   GAD (generalized anxiety disorder) 12/26/2019   Poor social situation 08/29/2019   Abnormal chest CT 08/29/2019   Poor dentition 06/25/2019   Lung nodule, solitary 06/25/2019   Chronic cough 11/26/2018   Recent unintentional weight loss over several months 11/05/2018   Current smoker 11/05/2018   Paresthesia 07/22/2018   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     bisoprolol (ZEBETA) 5 MG tablet Take 0.5 tablets (2.5 mg total) by mouth daily. 45 tablet 3   busPIRone (BUSPAR) 10 MG tablet TAKE 1 TABLET BY MOUTH TWICE A DAY AS NEEDED 180 tablet  4   cyclobenzaprine (FLEXERIL) 10 MG tablet Take 1 tablet (10 mg total) by mouth 3 (three) times daily as needed for muscle spasms. 30 tablet 0   Fluticasone-Umeclidin-Vilant (TRELEGY ELLIPTA) 200-62.5-25 MCG/ACT AEPB Inhale 1 puff into the lungs daily. 60 each 5   gabapentin (NEURONTIN) 300 MG capsule TAKE 1 CAPSULE BY MOUTH TWICE A DAY 90 capsule 3   HYDROcodone bit-homatropine (HYDROMET) 5-1.5 MG/5ML syrup Take 5 mLs by mouth at bedtime as needed for cough. 120 mL 0   lidocaine (LIDODERM) 5 % Place 1 patch onto the skin daily. Remove & Discard patch within 12 hours or as directed by MD 30 patch 0   meloxicam (MOBIC) 15 MG tablet TAKE 1 TABLET (15 MG TOTAL) BY MOUTH DAILY. 30 tablet 0   mirabegron ER (MYRBETRIQ) 25 MG TB24 tablet Take 1 tablet (25 mg total) by mouth daily. 30 tablet 0   nicotine (NICODERM CQ) 21 mg/24hr patch Place 1 patch (21 mg total) onto the skin daily. 28 patch 2   nitroGLYCERIN (NITROSTAT) 0.4 MG SL tablet Place 1 tablet (0.4 mg total) under the tongue every 5 (five) minutes as needed for chest pain. 25 tablet 1   omeprazole (PRILOSEC) 20 MG capsule Take 1 capsule (20 mg total) by mouth daily. 30 capsule 2   ondansetron (ZOFRAN) 4 MG tablet TAKE AS NEEDED FOR NAUSEA (FOLLOW UP WITH PRIMARY CARE PROVIDER FOR REFILLS) 90 tablet 2   Pilocarpine HCl (VUITY) 1.25 % SOLN Place 1 drop into  both eyes daily.     RESTASIS 0.05 % ophthalmic emulsion Place 1 drop into both eyes 2 (two) times daily.     rosuvastatin (CRESTOR) 40 MG tablet TAKE 1 TABLET BY MOUTH EVERY DAY 90 tablet 3   sertraline (ZOLOFT) 100 MG tablet TAKE 1 TABLET BY MOUTH EVERY DAY 90 tablet 2   sucralfate (CARAFATE) 1 g tablet Take 1 tablet (1 g total) by mouth 4 (four) times daily -  with meals and at bedtime. 120 tablet 0   SUMAtriptan (IMITREX) 6 MG/0.5ML SOLN injection Inject 0.5 mLs (6 mg total) into the skin every 2 (two) hours as needed for migraine or headache. May repeat in 2 hours if headache persists or  recurs.  Limit to twice per week. 0.5 mL 8   VENTOLIN HFA 108 (90 Base) MCG/ACT inhaler INHALE 1-2 PUFFS BY MOUTH EVERY 6 HOURS AS NEEDED FOR WHEEZE OR SHORTNESS OF BREATH 18 each 2   Vitamin D, Ergocalciferol, (DRISDOL) 1.25 MG (50000 UNIT) CAPS capsule TAKE 1 CAPSULE (50,000 UNITS TOTAL) BY MOUTH EVERY 7 (SEVEN) DAYS FOR 12 DOSES. 12 capsule 0   No current facility-administered medications on file prior to visit.    Allergies  Allergen Reactions   Dextromethorphan Other (See Comments)    keeps awake, legs constantly moving    Doxylamine Other (See Comments)    keeps awake, legs constantly moving    Pseudoeph-Doxylamine-Dm-Apap Other (See Comments)    REACTION: keeps awake, legs constantly moving  NYQUIL   Pseudoephedrine Hcl Other (See Comments)    keeps awake, legs constantly moving     Social History   Socioeconomic History   Marital status: Divorced    Spouse name: Not on file   Number of children: Not on file   Years of education: Not on file   Highest education level: Not on file  Occupational History   Not on file  Tobacco Use   Smoking status: Every Day    Packs/day: 1.00    Years: 19.00    Total pack years: 19.00    Types: Cigarettes    Passive exposure: Current   Smokeless tobacco: Never   Tobacco comments:    0.5-1ppd as of 06/13/22 ep  Vaping Use   Vaping Use: Never used  Substance and Sexual Activity   Alcohol use: Yes    Comment: OCCASIONALLY   Drug use: No   Sexual activity: Yes    Partners: Male    Birth control/protection: Surgical  Other Topics Concern   Not on file  Social History Narrative   ** Merged History Encounter **       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  Trying to get disability. Lives with mom.  Highest level of education:  High school   Social Determinants of Health   Financial Resource Strain: High Risk (08/13/2020)   Overall Financial Resource Strain (CARDIA)    Difficulty of Paying Living Expenses: Very  hard  Food Insecurity: No Food Insecurity (08/13/2020)   Hunger Vital Sign    Worried About Running Out of Food in the Last Year: Never true    Ran Out of Food in the Last Year: Never true  Transportation Needs: Not on file  Physical Activity: Not on file  Stress: Not on file  Social Connections: Not on file  Intimate Partner Violence: Not on file    Family History  Problem Relation Age of Onset   Diabetes Mother    Heart disease Mother  Hyperlipidemia Mother    Colon polyps Mother    Tuberculosis Father    Alcohol abuse Father    Heart disease Sister    Hyperlipidemia Sister    Heart disease Brother    Hyperlipidemia Brother    Diabetes Maternal Grandmother    Diabetes Maternal Grandfather    Colon cancer Maternal Uncle    Stomach cancer Maternal Uncle     Past Surgical History:  Procedure Laterality Date   ANKLE SURGERY     RIGHT   APPENDECTOMY     AUGMENTATION MAMMAPLASTY     BREAST ENHANCEMENT SURGERY     SALINE   BUNIONECTOMY     BILATERAL FEET   LEFT HEART CATH AND CORONARY ANGIOGRAPHY N/A 04/13/2021   Procedure: LEFT HEART CATH AND CORONARY ANGIOGRAPHY;  Surgeon: Leonie Man, MD;  Location: [facility];  Service: Cardiovascular;  Laterality: N/A;   TOOTH EXTRACTION N/A 07/11/2021   Procedure: DENTAL RESTORATION / EXTRACTIONS x 13;  Surgeon: Diona Browner, DMD;  Location: [facility];  Service: Oral Surgery;  Laterality: N/A;   TOTAL ABDOMINAL HYSTERECTOMY W/ BILATERAL SALPINGOOPHORECTOMY     TVT  10/30/2005   WISDOM TOOTH EXTRACTION     X 4    ROS: Review of Systems Negative except as stated above  PHYSICAL EXAM: There were no vitals taken for this visit.  Physical Exam  {female adult master:[id number]} {female adult master:[id number]}     Latest Ref Rng & Units 02/28/2022    8:31 AM 04/11/2021   11:19 AM 01/29/2021   10:30 PM  CMP  Glucose 70 - 99 mg/dL 89  92  145   BUN 6 - 24 mg/dL '12  8  10   '$ Creatinine 0.57 - 1.00 mg/dL  0.81  0.64  0.71   Sodium 134 - 144 mmol/L 142  135  134   Potassium 3.5 - 5.2 mmol/L 4.2  4.3  3.0   Chloride 96 - 106 mmol/L 99  99  101   CO2 20 - 29 mmol/L '28  29  23   '$ Calcium 8.7 - 10.2 mg/dL 10.1  8.8  8.7   ALT 0 - 32 IU/L 22      Lipid Panel     Component Value Date/Time   CHOL 219 (H) 02/28/2022 0831   TRIG 115 02/28/2022 0831   HDL 62 02/28/2022 0831   CHOLHDL 3.5 02/28/2022 0831   CHOLHDL 5.9 10/11/2011 1137   VLDL 27 10/11/2011 1137   LDLCALC 137 (H) 02/28/2022 0831    CBC    Component Value Date/Time   WBC 9.6 04/11/2021 1119   WBC 7.5 02/25/2021 1013   RBC 4.34 04/11/2021 1119   RBC 4.33 02/25/2021 1013   HGB 12.9 04/11/2021 1119   HCT 38.8 04/11/2021 1119   PLT 320 04/11/2021 1119   MCV 89 04/11/2021 1119   MCH 29.7 04/11/2021 1119   MCH 30.5 01/29/2021 2230   MCHC 33.2 04/11/2021 1119   MCHC 33.2 02/25/2021 1013   RDW 16.7 (H) 04/11/2021 1119   LYMPHSABS 1.8 02/25/2021 1013   LYMPHSABS 2.2 12/10/2020 1114   MONOABS 0.4 02/25/2021 1013   EOSABS 0.7 02/25/2021 1013   EOSABS 0.2 12/10/2020 1114   BASOSABS 0.1 02/25/2021 1013   BASOSABS 0.1 12/10/2020 1114    ASSESSMENT AND PLAN:  There are no diagnoses linked to this encounter.   Patient was given the opportunity to ask questions.  Patient verbalized understanding of the plan and was  able to repeat key elements of the plan. Patient was given clear instructions to go to Emergency Department or return to medical center if symptoms don't improve, worsen, or new problems develop.The patient verbalized understanding.   No orders of the defined types were placed in this encounter.    Requested Prescriptions    No prescriptions requested or ordered in this encounter    No follow-ups on file.  Camillia Herter, NP

## 2022-11-22 ENCOUNTER — Ambulatory Visit: Payer: Medicaid Other | Admitting: Family

## 2022-11-25 ENCOUNTER — Other Ambulatory Visit: Payer: Self-pay | Admitting: Family Medicine

## 2022-11-25 DIAGNOSIS — F419 Anxiety disorder, unspecified: Secondary | ICD-10-CM

## 2022-11-27 NOTE — Telephone Encounter (Signed)
Order complete. 

## 2022-12-03 DIAGNOSIS — R32 Unspecified urinary incontinence: Secondary | ICD-10-CM | POA: Diagnosis not present

## 2022-12-03 DIAGNOSIS — J449 Chronic obstructive pulmonary disease, unspecified: Secondary | ICD-10-CM | POA: Diagnosis not present

## 2022-12-12 ENCOUNTER — Other Ambulatory Visit: Payer: Self-pay | Admitting: Family

## 2022-12-12 DIAGNOSIS — Z1231 Encounter for screening mammogram for malignant neoplasm of breast: Secondary | ICD-10-CM

## 2022-12-13 NOTE — Progress Notes (Addendum)
Patient ID: [name], female    DOB: 01-Aug-1966  MRN: [id number]  CC: Follow-Up  Subjective: [name] is a 57 y.o. female who presents for follow-up.   Her concerns today include:  - Reports history of anemia and iron infusion in the past. Recently eating more ice and feeling fatigued. Would like to have iron checked.  - Insomnia related to states her mother is passing away from cancer and she is taking care of her. She denies thoughts of self-harm, suicidal ideations, homicidal ideations. - Requests referral to Dermatology. Reports history of skin cancer during childhood. She recently noticed a skin nodule of left shoulder. Endorses itching and burning of the area.  - States her pulmonologist sent a prescription to her pharmacy to assist with wheezing. She denies red flags. She does have COPD.      12/18/2022   12:55 PM 08/18/2022    1:03 PM 04/05/2022    8:34 AM 06/22/2021    9:00 AM 03/17/2021    8:40 AM  Depression screen PHQ 2/9  Decreased Interest 0 0 1 1 3  $ Down, Depressed, Hopeless 0 0 1 1 1  $ PHQ - 2 Score 0 0 2 2 4  $ Altered sleeping   3 2 2  $ Tired, decreased energy   3 3 3  $ Change in appetite   2 2 3  $ Feeling bad or failure about yourself    0 0 0  Trouble concentrating   2 1 2  $ Moving slowly or fidgety/restless   0 0 0  Suicidal thoughts   0  0  PHQ-9 Score   12 10 14  $ Difficult doing work/chores   Somewhat difficult  Somewhat difficult    Patient Active Problem List   Diagnosis Date Noted   Vitamin D deficiency 08/19/2022   Urinary incontinence 04/09/2022   ILD (interstitial lung disease) ([place]) 06/27/2021   Unstable angina ([place]) 04/13/2021   Atypical nevi 03/17/2021   Atherosclerosis 03/08/2021   Grief reaction 03/02/2021   Acute diarrhea 12/29/2020   Acute right lumbar radiculopathy 12/11/2020   Healthcare maintenance 12/11/2020   Acute lumbar back pain 07/25/2020   COPD (chronic obstructive pulmonary disease) ([place]) 06/01/2020   GAD (generalized  anxiety disorder) 12/26/2019   Poor social situation 08/29/2019   Abnormal chest CT 08/29/2019   Poor dentition 06/25/2019   Lung nodule, solitary 06/25/2019   Chronic cough 11/26/2018   Recent unintentional weight loss over several months 11/05/2018   Current smoker 11/05/2018   Paresthesia 07/22/2018   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     bisoprolol (ZEBETA) 5 MG tablet Take 0.5 tablets (2.5 mg total) by mouth daily. 45 tablet 3   busPIRone (BUSPAR) 10 MG tablet TAKE 1 TABLET BY MOUTH TWICE A DAY AS NEEDED 60 tablet 2   cyclobenzaprine (FLEXERIL) 10 MG tablet Take 1 tablet (10 mg total) by mouth 3 (three) times daily as needed for muscle spasms. 30 tablet 0   Fluticasone-Umeclidin-Vilant (TRELEGY ELLIPTA) 200-62.5-25 MCG/ACT AEPB Inhale 1 puff into the lungs daily. 60 each  5   gabapentin (NEURONTIN) 300 MG capsule TAKE 1 CAPSULE BY MOUTH TWICE A DAY 90 capsule 3   HYDROcodone bit-homatropine (HYDROMET) 5-1.5 MG/5ML syrup Take 5 mLs by mouth at bedtime as needed for cough. 120 mL 0   lidocaine (LIDODERM) 5 % Place 1 patch onto the skin daily. Remove & Discard patch within 12 hours or as directed by MD 30 patch 0   meloxicam (MOBIC) 15 MG tablet TAKE 1 TABLET (15 MG TOTAL) BY MOUTH DAILY. 30 tablet 0   mirabegron ER (MYRBETRIQ) 25 MG TB24 tablet Take 1 tablet (25 mg total) by mouth daily. 30 tablet 0   nicotine (NICODERM CQ) 21 mg/24hr patch Place 1 patch (21 mg total) onto the skin daily. 28 patch 2   nitroGLYCERIN (NITROSTAT) 0.4 MG SL tablet Place 1 tablet (0.4 mg total)  under the tongue every 5 (five) minutes as needed for chest pain. 25 tablet 1   omeprazole (PRILOSEC) 20 MG capsule Take 1 capsule (20 mg total) by mouth daily. 30 capsule 2   ondansetron (ZOFRAN) 4 MG tablet TAKE AS NEEDED FOR NAUSEA (FOLLOW UP WITH PRIMARY CARE PROVIDER FOR REFILLS) 90 tablet 2   Pilocarpine HCl (VUITY) 1.25 % SOLN Place 1 drop into both eyes daily.     RESTASIS 0.05 % ophthalmic emulsion Place 1 drop into both eyes 2 (two) times daily.     rosuvastatin (CRESTOR) 40 MG tablet TAKE 1 TABLET BY MOUTH EVERY DAY 90 tablet 3   sertraline (ZOLOFT) 100 MG tablet TAKE 1 TABLET BY MOUTH EVERY DAY 30 tablet 2   sucralfate (CARAFATE) 1 g tablet Take 1 tablet (1 g total) by mouth 4 (four) times daily -  with meals and at bedtime. 120 tablet 0   SUMAtriptan (IMITREX) 6 MG/0.5ML SOLN injection Inject 0.5 mLs (6 mg total) into the skin every 2 (two) hours as needed for migraine or headache. May repeat in 2 hours if headache persists or recurs.  Limit to twice per week. 0.5 mL 8   VENTOLIN HFA 108 (90 Base) MCG/ACT inhaler INHALE 1-2 PUFFS BY MOUTH EVERY 6 HOURS AS NEEDED FOR WHEEZE OR SHORTNESS OF BREATH 18 each 2   No current facility-administered medications on file prior to visit.    Allergies  Allergen Reactions   Dextromethorphan Other (See Comments)    keeps awake, legs constantly moving    Doxylamine Other (See Comments)    keeps awake, legs constantly moving    Pseudoeph-Doxylamine-Dm-Apap Other (See Comments)    REACTION: keeps awake, legs constantly moving  NYQUIL   Pseudoephedrine Hcl Other (See Comments)    keeps awake, legs constantly moving     Social History   Socioeconomic History   Marital status: Divorced    Spouse name: Not on file   Number of children: Not on file   Years of education: Not on file   Highest education level: Not on file  Occupational History   Not on file  Tobacco Use   Smoking status: Every Day    Packs/day: 1.00    Years: 19.00     Total pack years: 19.00    Types: Cigarettes    Passive exposure: Current   Smokeless tobacco: Never   Tobacco comments:    0.5-1ppd as of 06/13/22 ep  Vaping Use   Vaping Use: Never used  Substance and Sexual Activity   Alcohol use: Yes    Comment: OCCASIONALLY   Drug use: No   Sexual activity: Yes  Partners: Male    Birth control/protection: Surgical  Other Topics Concern   Not on file  Social History Narrative   ** Merged History Encounter **       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  Trying to get disability. Lives with mom.  Highest level of education:  High school   Social Determinants of Health   Financial Resource Strain: High Risk (08/13/2020)   Overall Financial Resource Strain (CARDIA)    Difficulty of Paying Living Expenses: Very hard  Food Insecurity: No Food Insecurity (08/13/2020)   Hunger Vital Sign    Worried About Charity fundraiser in the Last Year: Never true    Ran Out of Food in the Last Year: Never true  Transportation Needs: Not on file  Physical Activity: Not on file  Stress: Not on file  Social Connections: Not on file  Intimate Partner Violence: Not on file    Family History  Problem Relation Age of Onset   Diabetes Mother    Heart disease Mother    Hyperlipidemia Mother    Colon polyps Mother    Tuberculosis Father    Alcohol abuse Father    Heart disease Sister    Hyperlipidemia Sister    Heart disease Brother    Hyperlipidemia Brother    Diabetes Maternal Grandmother    Diabetes Maternal Grandfather    Colon cancer Maternal Uncle    Stomach cancer Maternal Uncle     Past Surgical History:  Procedure Laterality Date   ANKLE SURGERY     RIGHT   APPENDECTOMY     AUGMENTATION MAMMAPLASTY     BREAST ENHANCEMENT SURGERY     SALINE   BUNIONECTOMY     BILATERAL FEET   LEFT HEART CATH AND CORONARY ANGIOGRAPHY N/A 04/13/2021   Procedure: LEFT HEART CATH AND CORONARY ANGIOGRAPHY;  Surgeon: Leonie Man, MD;   Location: [facility];  Service: Cardiovascular;  Laterality: N/A;   TOOTH EXTRACTION N/A 07/11/2021   Procedure: DENTAL RESTORATION / EXTRACTIONS x 13;  Surgeon: Diona Browner, DMD;  Location: [facility];  Service: Oral Surgery;  Laterality: N/A;   TOTAL ABDOMINAL HYSTERECTOMY W/ BILATERAL SALPINGOOPHORECTOMY     TVT  10/30/2005   WISDOM TOOTH EXTRACTION     X 4    ROS: Review of Systems Negative except as stated above  PHYSICAL EXAM: BP 103/68 (BP Location: Left Arm, Patient Position: Sitting, Cuff Size: Normal)   Pulse 85   Temp 98 F (36.7 C)   Resp 14   Ht 5' 0.63" (1.54 m)   Wt 105 lb (47.6 kg)   SpO2 92%   BMI 20.08 kg/m   Physical Exam HENT:     Head: Normocephalic and atraumatic.  Eyes:     Extraocular Movements: Extraocular movements intact.     Conjunctiva/sclera: Conjunctivae normal.     Pupils: Pupils are equal, round, and reactive to light.  Cardiovascular:     Rate and Rhythm: Normal rate and regular rhythm.     Pulses: Normal pulses.     Heart sounds: Normal heart sounds.  Pulmonary:     Breath sounds: Wheezing present.  Musculoskeletal:     Cervical back: Normal range of motion and neck supple.  Skin:    General: Skin is warm and dry.     Comments: Skin nodule left anterior shoulder. No drainage and no additional presentation.   Neurological:     General: No focal deficit  present.     Mental Status: She is alert and oriented to person, place, and time.  Psychiatric:        Mood and Affect: Mood normal.        Behavior: Behavior normal.      ASSESSMENT AND PLAN: 1. History of anemia - Routine screening.  - CBC - Iron, TIBC and Ferritin Panel  2. Insomnia, unspecified type - Insomnia secondary to patient reports her mother is passing away and she is taking care of her. Referral to Rosana Hoes, LCSWA for counseling/community resources. - Patient denies thoughts of self-harm, suicidal ideations, homicidal ideations. -  Trazodone as prescribed. Counseled on medication adherence/adverse effects.  - Follow-up with primary provider as scheduled.  - traZODone (DESYREL) 50 MG tablet; Take 0.5 tablets (25 mg total) by mouth at bedtime.  Dispense: 30 tablet; Refill: 0  3. History of skin cancer 4. Skin nodule - Referral to Dermatology for further evaluation/management.  - Ambulatory referral to Dermatology   Patient was given the opportunity to ask questions.  Patient verbalized understanding of the plan and was able to repeat key elements of the plan. Patient was given clear instructions to go to Emergency Department or return to medical center if symptoms don't improve, worsen, or new problems develop.The patient verbalized understanding.   Orders Placed This Encounter  Procedures   CBC   Iron, TIBC and Ferritin Panel   Ambulatory referral to Dermatology     Requested Prescriptions   Signed Prescriptions Disp Refills   traZODone (DESYREL) 50 MG tablet 30 tablet 0    Sig: Take 0.5 tablets (25 mg total) by mouth at bedtime.    Follow-up with primary provider as scheduled.   Camillia Herter, NP

## 2022-12-15 ENCOUNTER — Other Ambulatory Visit: Payer: Self-pay | Admitting: Family Medicine

## 2022-12-15 ENCOUNTER — Other Ambulatory Visit: Payer: Self-pay | Admitting: Family

## 2022-12-15 DIAGNOSIS — F411 Generalized anxiety disorder: Secondary | ICD-10-CM

## 2022-12-15 DIAGNOSIS — K219 Gastro-esophageal reflux disease without esophagitis: Secondary | ICD-10-CM

## 2022-12-15 MED ORDER — OMEPRAZOLE 20 MG PO CPDR: 20.0000 mg | DELAYED_RELEASE_CAPSULE | Freq: Every day | ORAL | 2 refills | Status: DC

## 2022-12-15 NOTE — Telephone Encounter (Signed)
Order complete. 

## 2022-12-18 ENCOUNTER — Encounter: Payer: Self-pay | Admitting: Family

## 2022-12-18 ENCOUNTER — Telehealth: Payer: Self-pay | Admitting: Family

## 2022-12-18 ENCOUNTER — Ambulatory Visit: Payer: Medicaid Other | Admitting: Family

## 2022-12-18 VITALS — BP 103/68 | HR 85 | Temp 98.0°F | Resp 14 | Ht 60.63 in | Wt 105.0 lb

## 2022-12-18 DIAGNOSIS — G4700 Insomnia, unspecified: Secondary | ICD-10-CM

## 2022-12-18 DIAGNOSIS — Z85828 Personal history of other malignant neoplasm of skin: Secondary | ICD-10-CM | POA: Diagnosis not present

## 2022-12-18 DIAGNOSIS — R229 Localized swelling, mass and lump, unspecified: Secondary | ICD-10-CM | POA: Diagnosis not present

## 2022-12-18 DIAGNOSIS — Z862 Personal history of diseases of the blood and blood-forming organs and certain disorders involving the immune mechanism: Secondary | ICD-10-CM

## 2022-12-18 MED ORDER — TRAZODONE HCL 50 MG PO TABS: 25.0000 mg | ORAL_TABLET | Freq: Every day | ORAL | 0 refills | Status: DC

## 2022-12-18 NOTE — Progress Notes (Signed)
Pt presents for fatigue  -going on for 2 weeks -request iron level checked -insomnia  -request dermatology for removal of skin tag she has on left shoulder, hx of skin cancer during childhood

## 2022-12-19 ENCOUNTER — Other Ambulatory Visit: Payer: Self-pay | Admitting: Family Medicine

## 2022-12-19 ENCOUNTER — Emergency Department (HOSPITAL_COMMUNITY)
Admission: EM | Admit: 2022-12-19 | Discharge: 2022-12-19 | Disposition: A | Discharge status: Home / Self Care | Payer: Medicaid Other | Attending: Emergency Medicine | Admitting: Emergency Medicine

## 2022-12-19 ENCOUNTER — Other Ambulatory Visit: Payer: Self-pay | Admitting: Family

## 2022-12-19 ENCOUNTER — Ambulatory Visit: Payer: Medicaid Other | Admitting: Family

## 2022-12-19 ENCOUNTER — Encounter (HOSPITAL_COMMUNITY): Payer: Self-pay

## 2022-12-19 DIAGNOSIS — R202 Paresthesia of skin: Secondary | ICD-10-CM

## 2022-12-19 DIAGNOSIS — D508 Other iron deficiency anemias: Secondary | ICD-10-CM | POA: Insufficient documentation

## 2022-12-19 DIAGNOSIS — D649 Anemia, unspecified: Secondary | ICD-10-CM | POA: Diagnosis present

## 2022-12-19 DIAGNOSIS — D509 Iron deficiency anemia, unspecified: Secondary | ICD-10-CM | POA: Diagnosis not present

## 2022-12-19 LAB — IRON,TIBC AND FERRITIN PANEL
Ferritin: 72 ng/mL (ref 15–150)
Iron Saturation: 8 % — CL (ref 15–55)
Iron: 25 ug/dL — ABNORMAL LOW (ref 27–159)
Total Iron Binding Capacity: 295 ug/dL (ref 250–450)
UIBC: 270 ug/dL (ref 131–425)

## 2022-12-19 LAB — CBC
Hematocrit: 39.9 % (ref 34.0–46.6)
Hemoglobin: 12.6 g/dL (ref 11.1–15.9)
MCH: 28.9 pg (ref 26.6–33.0)
MCHC: 31.6 g/dL (ref 31.5–35.7)
MCV: 92 fL (ref 79–97)
Platelets: 372 10*3/uL (ref 150–450)
RBC: 4.36 x10E6/uL (ref 3.77–5.28)
RDW: 14.6 % (ref 11.7–15.4)
WBC: 10 10*3/uL (ref 3.4–10.8)

## 2022-12-19 MED ORDER — GABAPENTIN 300 MG PO CAPS: 300.0000 mg | ORAL_CAPSULE | Freq: Two times a day (BID) | ORAL | 2 refills | Status: DC

## 2022-12-19 MED ORDER — FERROUS SULFATE 325 (65 FE) MG PO TABS: 325.0000 mg | ORAL_TABLET | Freq: Every day | ORAL | 0 refills | Status: DC

## 2022-12-19 MED ORDER — ONDANSETRON 4 MG PO TBDP: ORAL_TABLET | ORAL | 1 refills | Status: DC

## 2022-12-19 NOTE — Telephone Encounter (Signed)
Order complete. 

## 2022-12-19 NOTE — Discharge Instructions (Signed)
Get in touch with the hematology department today while you are over there and they can set you up for an iron infusion

## 2022-12-19 NOTE — ED Provider Notes (Signed)
[place] EMERGENCY DEPARTMENT AT [facility] Provider Note   CSN: [id number] Arrival date & time: 12/19/22  1144     History  Chief Complaint  Patient presents with   Abnormal Lab    [name] is a 57 y.o. female.  Patient sent over here for low iron.  She has a history of anemia.  The history is provided by the patient and medical records. No language interpreter was used.  Weakness Severity:  Mild Onset quality:  Gradual Timing:  Constant Progression:  Waxing and waning Chronicity:  Chronic Context: not alcohol use   Relieved by:  Nothing Worsened by:  Nothing Ineffective treatments:  None tried Associated symptoms: no abdominal pain, no chest pain, no cough, no diarrhea, no frequency, no headaches and no seizures        Home Medications Prior to Admission medications   Medication Sig Start Date End Date Taking? Authorizing Provider  ferrous sulfate 325 (65 FE) MG tablet Take 1 tablet (325 mg total) by mouth daily. 12/19/22  Yes Milton Ferguson, MD  ondansetron (ZOFRAN-ODT) 4 MG disintegrating tablet 52m ODT q4 hours prn nausea/vomit 12/19/22  Yes ZMilton Ferguson MD  albuterol (PROVENTIL) (2.5 MG/3ML) 0.083% nebulizer solution INHALE 3 ML BY NEBULIZATION EVERY 6 HOURS AS NEEDED FOR WHEEZING OR SHORTNESS OF BREATH 03/09/22   WMartyn Ehrich NP  aspirin 81 MG chewable tablet Chew 81 mg by mouth daily.    [provider]  bisoprolol (ZEBETA) 5 MG tablet Take 0.5 tablets (2.5 mg total) by mouth daily. 02/28/22   Nahser, PWonda Cheng MD  busPIRone (BUSPAR) 10 MG tablet TAKE 1 TABLET BY MOUTH TWICE A DAY AS NEEDED 11/27/22   SCamillia Herter NP  cyclobenzaprine (FLEXERIL) 10 MG tablet Take 1 tablet (10 mg total) by mouth 3 (three) times daily as needed for muscle spasms. 04/05/22   PShary Key DO  Fluticasone-Umeclidin-Vilant (TRELEGY ELLIPTA) 200-62.5-25 MCG/ACT AEPB Inhale 1 puff into the lungs daily. 03/31/22   RBrand Males MD  gabapentin  (NEURONTIN) 300 MG capsule Take 1 capsule (300 mg total) by mouth 2 (two) times daily. 12/19/22   SCamillia Herter NP  HYDROcodone bit-homatropine (HYDROMET) 5-1.5 MG/5ML syrup Take 5 mLs by mouth at bedtime as needed for cough. 11/15/22   GMagdalen Spatz NP  lidocaine (LIDODERM) 5 % Place 1 patch onto the skin daily. Remove & Discard patch within 12 hours or as directed by MD 06/19/22   MTeodora Medici FNP  meloxicam (MOBIC) 15 MG tablet TAKE 1 TABLET (15 MG TOTAL) BY MOUTH DAILY. 10/04/22   SCamillia Herter NP  mirabegron ER (MYRBETRIQ) 25 MG TB24 tablet Take 1 tablet (25 mg total) by mouth daily. 04/05/22   PShary Key DO  nicotine (NICODERM CQ) 21 mg/24hr patch Place 1 patch (21 mg total) onto the skin daily. 06/30/21   RBrand Males MD  nitroGLYCERIN (NITROSTAT) 0.4 MG SL tablet Place 1 tablet (0.4 mg total) under the tongue every 5 (five) minutes as needed for chest pain. 03/11/21   Nahser, PWonda Cheng MD  omeprazole (PRILOSEC) 20 MG capsule Take 1 capsule (20 mg total) by mouth daily. 12/15/22 03/15/23  SCamillia Herter NP  ondansetron (ZOFRAN) 4 MG tablet TAKE AS NEEDED FOR NAUSEA (FOLLOW UP WITH PRIMARY CARE PROVIDER FOR REFILLS) 06/21/22   Nahser, PWonda Cheng MD  Pilocarpine HCl (VUITY) 1.25 % SOLN Place 1 drop into both eyes daily.    [provider]  RESTASIS  0.05 % ophthalmic emulsion Place 1 drop into both eyes 2 (two) times daily. 03/16/21   [provider]  rosuvastatin (CRESTOR) 40 MG tablet TAKE 1 TABLET BY MOUTH EVERY DAY 03/09/22   Ezequiel Essex, MD  sertraline (ZOLOFT) 100 MG tablet TAKE 1 TABLET BY MOUTH EVERY DAY 12/15/22   Camillia Herter, NP  sucralfate (CARAFATE) 1 g tablet Take 1 tablet (1 g total) by mouth 4 (four) times daily -  with meals and at bedtime. 06/19/22   Teodora Medici, FNP  SUMAtriptan (IMITREX) 6 MG/0.5ML SOLN injection Inject 0.5 mLs (6 mg total) into the skin every 2 (two) hours as needed for migraine or headache. May repeat in 2 hours if  headache persists or recurs.  Limit to twice per week. 11/04/21   Narda Amber K, DO  traZODone (DESYREL) 50 MG tablet Take 0.5 tablets (25 mg total) by mouth at bedtime. 12/18/22   Camillia Herter, NP  VENTOLIN HFA 108 (90 Base) MCG/ACT inhaler INHALE 1-2 PUFFS BY MOUTH EVERY 6 HOURS AS NEEDED FOR WHEEZE OR SHORTNESS OF BREATH 08/21/22   Martyn Ehrich, NP      Allergies    Dextromethorphan, Doxylamine, Pseudoeph-doxylamine-dm-apap, and Pseudoephedrine hcl    Review of Systems   Review of Systems  Constitutional:  Negative for appetite change and fatigue.  HENT:  Negative for congestion, ear discharge and sinus pressure.   Eyes:  Negative for discharge.  Respiratory:  Negative for cough.   Cardiovascular:  Negative for chest pain.  Gastrointestinal:  Negative for abdominal pain and diarrhea.  Genitourinary:  Negative for frequency and hematuria.  Musculoskeletal:  Negative for back pain.  Skin:  Negative for rash.  Neurological:  Positive for weakness. Negative for seizures and headaches.  Psychiatric/Behavioral:  Negative for hallucinations.     Physical Exam Updated Vital Signs BP (!) 89/71 (BP Location: Left Arm)   Pulse 81   Temp 98.3 F (36.8 C) (Oral)   SpO2 92%  Physical Exam Vitals and nursing note reviewed.  Constitutional:      Appearance: She is well-developed.  HENT:     Head: Normocephalic.     Nose: Nose normal.  Eyes:     General: No scleral icterus.    Conjunctiva/sclera: Conjunctivae normal.  Neck:     Thyroid: No thyromegaly.  Cardiovascular:     Rate and Rhythm: Normal rate and regular rhythm.     Heart sounds: No murmur heard.    No friction rub. No gallop.  Pulmonary:     Breath sounds: No stridor. No wheezing or rales.  Chest:     Chest wall: No tenderness.  Abdominal:     General: There is no distension.     Tenderness: There is no abdominal tenderness. There is no rebound.  Musculoskeletal:        General: Normal range of motion.      Cervical back: Neck supple.  Lymphadenopathy:     Cervical: No cervical adenopathy.  Skin:    Findings: No erythema or rash.  Neurological:     Mental Status: She is alert and oriented to person, place, and time.     Motor: No abnormal muscle tone.     Coordination: Coordination normal.  Psychiatric:        Behavior: Behavior normal.     ED Results / Procedures / Treatments   Labs (all labs ordered are listed, but only abnormal results are displayed) Labs Reviewed - No data to display  EKG None  Radiology No results found.  Procedures Procedures    Medications Ordered in ED Medications - No data to display  ED Course/ Medical Decision Making/ A&P  I spoke with hematology and they are going to arrange for her to see in the office and get iron infusion.  In the meantime she will get iron p.o. and Zofran                           Medical Decision Making Risk OTC drugs. Prescription drug management.   Low iron and anemia.  Patient put on iron and Zofran and will follow-up with hematology        Final Clinical Impression(s) / ED Diagnoses Final diagnoses:  Other iron deficiency anemia    Rx / DC Orders ED Discharge Orders          Ordered    ferrous sulfate 325 (65 FE) MG tablet  Daily        12/19/22 1343    ondansetron (ZOFRAN-ODT) 4 MG disintegrating tablet        12/19/22 1343              Milton Ferguson, MD 12/19/22 1647

## 2022-12-19 NOTE — ED Triage Notes (Signed)
Pt presents with c/o abnormal lab. Pt reports that she was told to come for an iron infusion as her iron level was low. Pt reports feeling fatigued at this time.

## 2022-12-20 ENCOUNTER — Other Ambulatory Visit: Payer: Self-pay | Admitting: Family Medicine

## 2022-12-20 ENCOUNTER — Other Ambulatory Visit: Payer: Self-pay

## 2022-12-20 ENCOUNTER — Telehealth: Payer: Self-pay | Admitting: Hematology and Oncology

## 2022-12-20 DIAGNOSIS — F411 Generalized anxiety disorder: Secondary | ICD-10-CM

## 2022-12-20 DIAGNOSIS — R051 Acute cough: Secondary | ICD-10-CM

## 2022-12-20 NOTE — Telephone Encounter (Signed)
[name],  please see patient my chart message below: Patient states she never filled the last script of Hydromet syrup sent on 11/15/2022.  [facility]  Please advise.  Thank you.  From: Nelma Rothman Sent: 12/15/2022 11:49 AM EST To: [facility] Pool Subject: Medication Renewal Request  Is there something else you can prescribed [facility] can you please call it in at [place] I have not slept well i have tried otc cough syrups nothing is working my cough and my ribs are hurting so bad the pharmacist was really rude he said it could be a day a week  a year don't hold my breath  Thank you so much for your time      11/27/22  9:53 AM They didn't have it [facility] said they were trying to get a different kind something about the [place] not supply it so they didn't fill it November 23, 2022 [place], Tristan Schroeder, CMA  to [name]     11/23/22  9:40 AM [name], I'm showing you had the cough syrup filled on 11/15/2022. Are you out already?  Last read by Nelma Rothman at  5:30 AM on 12/19/2022. November 22, 2022      11/22/22  1:14 PM Nelma Rothman contacted Me (Web)  Nelma Rothman    11/22/22  1:14 PM Refills have been requested for the following medications:       HYDROcodone bit-homatropine (HYDROMET) 5-1.5 MG/5ML syrup Judson Roch F Groce]   [id number] [place] [place] [facility] [address] Delivery method: [employer]

## 2022-12-20 NOTE — Telephone Encounter (Signed)
Make patient aware Sertraline ordered 12/15/2022 for 90 day supply.

## 2022-12-20 NOTE — Telephone Encounter (Signed)
From: Nelma Rothman Sent: 12/15/2022 11:49 AM EST To: [facility] Pool Subject: Medication Renewal Request  Is there something else you can prescribed [facility] syrup can you please call it in at [place] I have not slept well i have tried otc cough syrups nothing is working my cough and my ribs are hurting so bad the pharmacist was really rude he said it could be a day a week a year don't hold my breath Thank you so much for your time

## 2022-12-20 NOTE — Telephone Encounter (Signed)
Scheduled appt per 2/19 referral. Pt is aware of appt date and time. Pt is aware to arrive 15 mins prior to appt time and to bring and updated insurance card. Pt is aware of appt location.

## 2022-12-22 ENCOUNTER — Other Ambulatory Visit: Payer: Self-pay | Admitting: Acute Care

## 2022-12-25 NOTE — Telephone Encounter (Signed)
Please follow up and evaluate clients needs from notes and client. Provide resources as needed.

## 2022-12-25 NOTE — Telephone Encounter (Signed)
Phone call was made. Patient stated that she needed help with her mother but has the number for [facility] [place] and their hotline number. Patient had to tend to mother but will call back if needed.

## 2022-12-27 ENCOUNTER — Other Ambulatory Visit: Payer: Self-pay | Admitting: Acute Care

## 2022-12-27 DIAGNOSIS — R051 Acute cough: Secondary | ICD-10-CM

## 2022-12-27 MED ORDER — HYDROCODONE BIT-HOMATROP MBR 5-1.5 MG/5ML PO SOLN
5.0000 mL | Freq: Every evening | ORAL | 0 refills | Status: DC | PRN
  Filled 2022-12-27 – 2023-02-09 (×2): qty 120, 24d supply, fill #0

## 2022-12-27 NOTE — Telephone Encounter (Signed)
[name], Please send in Hydromet syrup to [facility].  Thank you.

## 2022-12-28 ENCOUNTER — Encounter: Payer: Self-pay | Admitting: Pharmacist

## 2022-12-28 ENCOUNTER — Other Ambulatory Visit (HOSPITAL_COMMUNITY): Payer: Self-pay

## 2022-12-28 ENCOUNTER — Other Ambulatory Visit: Payer: Self-pay

## 2022-12-29 ENCOUNTER — Other Ambulatory Visit (HOSPITAL_COMMUNITY): Payer: Self-pay

## 2022-12-29 ENCOUNTER — Encounter (HOSPITAL_COMMUNITY): Payer: Self-pay

## 2022-12-30 ENCOUNTER — Encounter: Payer: Self-pay | Admitting: Oncology

## 2022-12-30 ENCOUNTER — Inpatient Hospital Stay: Payer: Medicaid Other | Attending: Hematology and Oncology | Admitting: Oncology

## 2022-12-30 ENCOUNTER — Inpatient Hospital Stay: Payer: Medicaid Other

## 2022-12-30 VITALS — BP 97/69 | HR 90 | Temp 97.9°F | Resp 17 | Ht 60.63 in | Wt 107.9 lb

## 2022-12-30 DIAGNOSIS — E611 Iron deficiency: Secondary | ICD-10-CM | POA: Insufficient documentation

## 2022-12-30 DIAGNOSIS — K5730 Diverticulosis of large intestine without perforation or abscess without bleeding: Secondary | ICD-10-CM | POA: Insufficient documentation

## 2022-12-30 DIAGNOSIS — M810 Age-related osteoporosis without current pathological fracture: Secondary | ICD-10-CM | POA: Diagnosis not present

## 2022-12-30 DIAGNOSIS — F17200 Nicotine dependence, unspecified, uncomplicated: Secondary | ICD-10-CM

## 2022-12-30 NOTE — Patient Instructions (Signed)
Please start [facility] iron 25 mg or Nature made iron; one tablet every other day

## 2022-12-30 NOTE — Progress Notes (Signed)
[facility] Initial Visit:  Patient Care Team: Camillia Herter, NP as PCP - General (Nurse Practitioner) Nahser, Wonda Cheng, MD as PCP - Cardiology (Cardiology) Alda Berthold, DO as Consulting Physician (Neurology)  CHIEF COMPLAINTS/PURPOSE OF CONSULTATION:  HISTORY OF PRESENTING ILLNESS: [name] 57 y.o. female is here because of  iron deficiency  Medical history notable for anemia during pregnancies, arthritis, nephrolithiasis, COPD, tobacco use, mitral valve prolapse, endometriosis, MI, osteoporosis, tick bite, carious degeneration of multiple teeth  February 17, 2021: Colonoscopy revealed 2 mm polyp in ascending colon removed with cold snare.  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  Was placed on iron sulfate in February 2024 because of GI intolerance; had a similar problem with PNV.  She has never had an EGD.  Amenorrheic and s/p hysterectomy in 2000 for management of dysfunctional uterine bleeding.  Patient is supposed to take ASA daily but in fact takes it only when she has chest pain.  She does not take Mobic regularly and does not take Goody Powders or ibuprofen.  She is not on oral anticoagulants.   Does not take carafate.  Denies hematochezia, melena.  Has hematuria only when passes a kidney stone.  Has constant cravings for ice.    Social:  Divorced.  Disabled.  Tobacco smokes 1 ppd of cigarettes. EtOH on holidays  [facility] Mother alive 15 lung cancer with metastasis, CAD, COPD Father estranged Sister died 11 MVA Sister alive 11 CVA, obese Brother alive 53 HTN, DM Type II, hypercholesterolemia, HTN    Review of Systems - Oncology  MEDICAL HISTORY: Past Medical History:  Diagnosis Date   Allergy    Anemia     during pregnancies   Anxiety    Arthritis    hands and hip   Cancer ([place])    CAP (community acquired pneumonia) 01/07/2020   Chronic kidney disease    KIDNEYSTONES   Cigarette smoker 11/05/2018   COPD (chronic obstructive pulmonary disease) ([place])    Depression    Heart murmur    MITRAL VALVE PROLASP   High cholesterol    History of endometriosis    Incontinence    Low back pain radiating to both legs 07/25/2020   Menopausal symptoms    Menopause 01/09/2012   Migraine    Myocardial infarction ([place])    silent 3 years ago   Night sweats 10/05/2019   Osteoporosis    Paresthesia of foot, bilateral 03/28/2019   Productive cough 12/25/2019   Renal stone 06/01/2020   Skin cancer    Thyroid disease    Tick bite 02/10/2019    SURGICAL HISTORY: Past Surgical History:  Procedure Laterality Date   ANKLE SURGERY     RIGHT   APPENDECTOMY     AUGMENTATION MAMMAPLASTY     BREAST ENHANCEMENT SURGERY     SALINE   BUNIONECTOMY     BILATERAL FEET   LEFT HEART CATH AND CORONARY ANGIOGRAPHY N/A 04/13/2021   Procedure: LEFT HEART CATH AND CORONARY ANGIOGRAPHY;  Surgeon: Leonie Man, MD;  Location: [facility];  Service: Cardiovascular;  Laterality: N/A;   TOOTH EXTRACTION N/A 07/11/2021   Procedure: DENTAL RESTORATION / EXTRACTIONS x 13;  Surgeon: Diona Browner, DMD;  Location: [facility];  Service: Oral Surgery;  Laterality: N/A;   TOTAL ABDOMINAL HYSTERECTOMY W/ BILATERAL SALPINGOOPHORECTOMY     TVT  10/30/2005   WISDOM TOOTH EXTRACTION     X 4    SOCIAL HISTORY: Social History   Socioeconomic History   Marital status: Divorced    Spouse name: Not on file   Number of children: 2   Years of education: 12+2   Highest education level: Some college, no degree  Occupational History    Comment: Disabled  Tobacco Use   Smoking status: Every Day    Packs/day: 1.00    Years: 34.00    Total pack years: 34.00    Types: Cigarettes    Passive exposure:  Current   Smokeless tobacco: Never   Tobacco comments:    0.5-1ppd as of 06/13/22 ep  Vaping Use   Vaping Use: Never used  Substance and Sexual Activity   Alcohol use: Yes    Comment: OCCASIONALLY   Drug use: No   Sexual activity: Yes    Partners: Male    Birth control/protection: Surgical  Other Topics Concern   Not on file  Social History Narrative   ** Merged History Encounter **       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  Trying to get disability. Lives with mom.  Highest level of education:  High school   Social Determinants of Health   Financial Resource Strain: High Risk (08/13/2020)   Overall Financial Resource Strain (CARDIA)    Difficulty of Paying Living Expenses: Very hard  Food Insecurity: No Food Insecurity (12/30/2022)   Hunger Vital Sign    Worried About Running Out of Food in the Last Year: Never true    Ran Out of Food in the Last Year: Never true  Transportation Needs: No Transportation Needs (12/30/2022)   PRAPARE - Hydrologist (Medical): No    Lack of Transportation (Non-Medical): No  Physical Activity: Not on file  Stress: Not on file  Social Connections: Not on file  Intimate Partner Violence: Not At Risk (12/30/2022)   Humiliation, Afraid, Rape, and Kick questionnaire    Fear of Current or Ex-Partner: No    Emotionally Abused: No    Physically Abused: No    Sexually Abused: No    FAMILY HISTORY Family History  Problem Relation Age of Onset   Diabetes Mother    Heart disease Mother    Hyperlipidemia Mother    Colon polyps Mother    Tuberculosis Father    Alcohol abuse Father    Heart disease Sister    Hyperlipidemia Sister    Heart disease Brother    Hyperlipidemia Brother    Diabetes Maternal Grandmother    Diabetes Maternal Grandfather    Colon cancer Maternal Uncle    Stomach cancer Maternal Uncle     ALLERGIES:  is allergic to dextromethorphan, doxylamine, pseudoeph-doxylamine-dm-apap, and  pseudoephedrine hcl.  MEDICATIONS:  Current Outpatient Medications  Medication Sig Dispense Refill   albuterol (PROVENTIL) (2.5 MG/3ML) 0.083% nebulizer solution INHALE 3 ML BY NEBULIZATION EVERY 6 HOURS AS NEEDED FOR WHEEZING OR SHORTNESS OF BREATH 75 mL 5   aspirin 81 MG chewable tablet Chew 81 mg by mouth daily.     bisoprolol (ZEBETA) 5 MG tablet Take 0.5 tablets (2.5 mg total) by mouth daily. 45 tablet 3   busPIRone (BUSPAR) 10 MG tablet TAKE 1 TABLET BY MOUTH TWICE A DAY AS NEEDED 60 tablet  2   cyclobenzaprine (FLEXERIL) 10 MG tablet Take 1 tablet (10 mg total) by mouth 3 (three) times daily as needed for muscle spasms. 30 tablet 0   ferrous sulfate 325 (65 FE) MG tablet Take 1 tablet (325 mg total) by mouth daily. 100 tablet 0   Fluticasone-Umeclidin-Vilant (TRELEGY ELLIPTA) 200-62.5-25 MCG/ACT AEPB Inhale 1 puff into the lungs daily. 60 each 5   gabapentin (NEURONTIN) 300 MG capsule Take 1 capsule (300 mg total) by mouth 2 (two) times daily. 60 capsule 2   HYDROcodone bit-homatropine (HYDROMET) 5-1.5 MG/5ML syrup Take 5 mLs by mouth at bedtime as needed for cough. 120 mL 0   lidocaine (LIDODERM) 5 % Place 1 patch onto the skin daily. Remove & Discard patch within 12 hours or as directed by MD 30 patch 0   meloxicam (MOBIC) 15 MG tablet TAKE 1 TABLET (15 MG TOTAL) BY MOUTH DAILY. 30 tablet 0   mirabegron ER (MYRBETRIQ) 25 MG TB24 tablet Take 1 tablet (25 mg total) by mouth daily. 30 tablet 0   nicotine (NICODERM CQ) 21 mg/24hr patch Place 1 patch (21 mg total) onto the skin daily. 28 patch 2   nitroGLYCERIN (NITROSTAT) 0.4 MG SL tablet Place 1 tablet (0.4 mg total) under the tongue every 5 (five) minutes as needed for chest pain. 25 tablet 1   omeprazole (PRILOSEC) 20 MG capsule Take 1 capsule (20 mg total) by mouth daily. 30 capsule 2   ondansetron (ZOFRAN) 4 MG tablet TAKE AS NEEDED FOR NAUSEA (FOLLOW UP WITH PRIMARY CARE PROVIDER FOR REFILLS) 90 tablet 2   ondansetron (ZOFRAN-ODT) 4 MG  disintegrating tablet '4mg'$  ODT q4 hours prn nausea/vomit 12 tablet 1   Pilocarpine HCl (VUITY) 1.25 % SOLN Place 1 drop into both eyes daily.     RESTASIS 0.05 % ophthalmic emulsion Place 1 drop into both eyes 2 (two) times daily.     rosuvastatin (CRESTOR) 40 MG tablet TAKE 1 TABLET BY MOUTH EVERY DAY 90 tablet 3   sertraline (ZOLOFT) 100 MG tablet TAKE 1 TABLET BY MOUTH EVERY DAY 30 tablet 2   sucralfate (CARAFATE) 1 g tablet Take 1 tablet (1 g total) by mouth 4 (four) times daily -  with meals and at bedtime. 120 tablet 0   SUMAtriptan (IMITREX) 6 MG/0.5ML SOLN injection Inject 0.5 mLs (6 mg total) into the skin every 2 (two) hours as needed for migraine or headache. May repeat in 2 hours if headache persists or recurs.  Limit to twice per week. 0.5 mL 8   traZODone (DESYREL) 50 MG tablet Take 0.5 tablets (25 mg total) by mouth at bedtime. 30 tablet 0   VENTOLIN HFA 108 (90 Base) MCG/ACT inhaler INHALE 1-2 PUFFS BY MOUTH EVERY 6 HOURS AS NEEDED FOR WHEEZE OR SHORTNESS OF BREATH 18 each 2   No current facility-administered medications for this visit.    PHYSICAL EXAMINATION:  ECOG PERFORMANCE STATUS: 0 - Asymptomatic   Vitals:   12/30/22 0936  BP: 97/69  Pulse: 90  Resp: 17  Temp: 97.9 F (36.6 C)  SpO2: 96%    Filed Weights   12/30/22 0954  Weight: 107 lb 14.4 oz (48.9 kg)     Physical Exam Vitals and nursing note reviewed.  Constitutional:      Appearance: Normal appearance. She is not toxic-appearing or diaphoretic.     Comments: Here with mother.  Smells of cigarette smoke  HENT:     Head: Normocephalic and atraumatic.     Right  Ear: External ear normal.     Left Ear: External ear normal.     Nose: Nose normal. No congestion or rhinorrhea.  Eyes:     General: No scleral icterus.    Extraocular Movements: Extraocular movements intact.     Conjunctiva/sclera: Conjunctivae normal.     Pupils: Pupils are equal, round, and reactive to light.  Cardiovascular:     Rate  and Rhythm: Normal rate.     Heart sounds: No murmur heard.    No friction rub. No gallop.  Pulmonary:     Effort: Pulmonary effort is normal. No respiratory distress.     Breath sounds: No rhonchi or rales.     Comments: Rales at bases Abdominal:     General: Bowel sounds are normal.     Palpations: Abdomen is soft.  Musculoskeletal:        General: No swelling, tenderness or deformity.     Cervical back: Normal range of motion and neck supple. No rigidity or tenderness.  Lymphadenopathy:     Head:     Right side of head: No submental, submandibular, tonsillar, preauricular, posterior auricular or occipital adenopathy.     Left side of head: No submental, submandibular, tonsillar, preauricular, posterior auricular or occipital adenopathy.     Cervical: No cervical adenopathy.     Right cervical: No superficial, deep or posterior cervical adenopathy.    Left cervical: No superficial, deep or posterior cervical adenopathy.     Upper Body:     Right upper body: No supraclavicular, axillary, pectoral or epitrochlear adenopathy.     Left upper body: No supraclavicular, axillary, pectoral or epitrochlear adenopathy.  Skin:    General: Skin is warm.     Coloration: Skin is not jaundiced.  Neurological:     General: No focal deficit present.     Mental Status: She is alert and oriented to person, place, and time.     Cranial Nerves: No cranial nerve deficit.  Psychiatric:        Mood and Affect: Mood normal.        Behavior: Behavior normal.        Thought Content: Thought content normal.        Judgment: Judgment normal.      LABORATORY DATA: I have personally reviewed the data as listed:  Office Visit on 12/18/2022  Component Date Value Ref Range Status   WBC 12/18/2022 10.0  3.4 - 10.8 x10E3/uL Final   RBC 12/18/2022 4.36  3.77 - 5.28 x10E6/uL Final   Hemoglobin 12/18/2022 12.6  11.1 - 15.9 g/dL Final   Hematocrit 12/18/2022 39.9  34.0 - 46.6 % Final   MCV 12/18/2022 92   79 - 97 fL Final   MCH 12/18/2022 28.9  26.6 - 33.0 pg Final   MCHC 12/18/2022 31.6  31.5 - 35.7 g/dL Final   RDW 12/18/2022 14.6  11.7 - 15.4 % Final   Platelets 12/18/2022 372  150 - 450 x10E3/uL Final   Total Iron Binding Capacity 12/18/2022 295  250 - 450 ug/dL Final   UIBC 12/18/2022 270  131 - 425 ug/dL Final   Iron 12/18/2022 25 (L)  27 - 159 ug/dL Final   Iron Saturation 12/18/2022 8 (LL)  15 - 55 % Final   Ferritin 12/18/2022 72  15 - 150 ng/mL Final    RADIOGRAPHIC STUDIES: I have personally reviewed the radiological images as listed and agree with the findings in the report  No results found.  ASSESSMENT/PLAN  57 y.o. female is here because of low iron saturation.  Medical history notable for anemia during pregnancies, arthritis, nephrolithiasis, COPD, tobacco use, mitral valve prolapse, endometriosis, MI, osteoporosis, tick bite, carious degeneration of multiple teeth  Low iron saturation  December 18 2022:  Was referred to ED by NP to address the following labs:  hemoglobin 12.6 Ferritin 72 iron saturation 8%  December 30 2022:  Patient reports difficulty tolerating oral iron therefor she does not take it  Serum iron levels and iron saturation levels vary markedly and even fluctuate from hour to hour, hence they are not a good measure of a patient's iron status.  Ferritin is more suited to this assessment as it provides a measure of storage iron however it is an acute phase reactant.    Therapeutics:  Patient is not anemic nor is she clinically iron deficient therefore she does not require IV iron.  Recommend consideration of low dose oral iron (Solgar gentle iron, Nature made) qod to daily.  Recommend consideration of EGD to evaluate UGIT as a possible source of blood loss   Tobacco use:  Discusses smoking cessation   Cancer Staging  No matching staging information was found for the patient.   No problem-specific Assessment & Plan notes found for this  encounter.    No orders of the defined types were placed in this encounter.   45  minutes was spent in patient care.  This included time spent preparing to see the patient (e.g., review of tests), obtaining and/or reviewing separately obtained history, counseling and educating the patient/family/caregiver, ordering medications, tests, or procedures; documenting clinical information in the electronic or other health record, independently interpreting results and communicating results to the patient/family/caregiver as well as coordination of care.       All questions were answered. The patient knows to call the clinic with any problems, questions or concerns.  This note was electronically signed.    Barbee Cough, MD  12/30/2022 9:59 AM

## 2023-01-02 ENCOUNTER — Other Ambulatory Visit: Payer: Self-pay

## 2023-01-02 DIAGNOSIS — R32 Unspecified urinary incontinence: Secondary | ICD-10-CM | POA: Diagnosis not present

## 2023-01-02 DIAGNOSIS — J449 Chronic obstructive pulmonary disease, unspecified: Secondary | ICD-10-CM | POA: Diagnosis not present

## 2023-01-05 ENCOUNTER — Encounter: Payer: Self-pay | Admitting: Oncology

## 2023-01-09 ENCOUNTER — Telehealth: Payer: Self-pay

## 2023-01-09 NOTE — Telephone Encounter (Signed)
[place] to make sure the referral went through, spoke with Sophia she stated she received it and would call the patient for a consult appointment.

## 2023-01-10 ENCOUNTER — Other Ambulatory Visit (HOSPITAL_COMMUNITY): Payer: Self-pay

## 2023-01-10 NOTE — Telephone Encounter (Addendum)
Called and verified Rx has been received by pharmacy.  Per Aaron Edelman (pharmacist at [facility]), rx was filled but patient never picked up the Hydromet syrup.  Pharmacy tried several times to contact patient, but was unable to reach [name].  Rx has been put back but can refill if patient contacts them. Sent patient my chart message regarding this.

## 2023-01-24 ENCOUNTER — Ambulatory Visit: Payer: Medicaid Other

## 2023-02-02 DIAGNOSIS — J449 Chronic obstructive pulmonary disease, unspecified: Secondary | ICD-10-CM | POA: Diagnosis not present

## 2023-02-02 DIAGNOSIS — R32 Unspecified urinary incontinence: Secondary | ICD-10-CM | POA: Diagnosis not present

## 2023-02-06 ENCOUNTER — Ambulatory Visit: Payer: Medicaid Other | Admitting: Internal Medicine

## 2023-02-09 ENCOUNTER — Other Ambulatory Visit (HOSPITAL_COMMUNITY): Payer: Self-pay

## 2023-02-09 ENCOUNTER — Other Ambulatory Visit: Payer: Self-pay

## 2023-02-12 ENCOUNTER — Encounter: Payer: Self-pay | Admitting: Oncology

## 2023-02-13 ENCOUNTER — Other Ambulatory Visit (HOSPITAL_COMMUNITY): Payer: Self-pay

## 2023-02-14 ENCOUNTER — Encounter: Payer: Self-pay | Admitting: Nurse Practitioner

## 2023-02-15 ENCOUNTER — Ambulatory Visit: Payer: Medicaid Other | Admitting: Internal Medicine

## 2023-02-18 ENCOUNTER — Other Ambulatory Visit: Payer: Self-pay | Admitting: Family

## 2023-02-18 DIAGNOSIS — G4700 Insomnia, unspecified: Secondary | ICD-10-CM

## 2023-02-19 ENCOUNTER — Other Ambulatory Visit: Payer: Self-pay | Admitting: Primary Care

## 2023-02-19 ENCOUNTER — Other Ambulatory Visit: Payer: Self-pay | Admitting: Cardiovascular Disease

## 2023-02-19 DIAGNOSIS — I341 Nonrheumatic mitral (valve) prolapse: Secondary | ICD-10-CM

## 2023-02-19 DIAGNOSIS — J449 Chronic obstructive pulmonary disease, unspecified: Secondary | ICD-10-CM

## 2023-02-19 DIAGNOSIS — E782 Mixed hyperlipidemia: Secondary | ICD-10-CM

## 2023-02-19 DIAGNOSIS — R053 Chronic cough: Secondary | ICD-10-CM

## 2023-02-19 NOTE — Telephone Encounter (Signed)
Requested medication (s) are due for refill today - yes  Requested medication (s) are on the active medication list -yes  Future visit scheduled -no  Last refill: 12/18/22 #30  Notes to clinic: Request for 90 day supply- original Rx does not include that amount- sent for provider review   Requested Prescriptions  Pending Prescriptions Disp Refills   traZODone (DESYREL) 50 MG tablet [Pharmacy Med Name: TRAZODONE 50 MG TABLET] 45 tablet 1    Sig: TAKE 1/2 TABLETS BY MOUTH AT BEDTIME.     Psychiatry: Antidepressants - Serotonin Modulator Passed - 02/18/2023 12:30 PM      Passed - Completed PHQ-2 or PHQ-9 in the last 360 days      Passed - Valid encounter within last 6 months    Recent Outpatient Visits           2 months ago History of anemia   [place] [facility], [place], NP   6 months ago Encounter to establish care   [facility], Amy J, NP   9 years ago Low back pain   [facility] Tanya Nones, Priscille Heidelberg, MD   9 years ago Dysuria   [facility] Pick[facility], Priscille Heidelberg, MD       Future Appointments             In 1 week Kalman Shan, MD [facility] at [facility] Prescriptions  Pending Prescriptions Disp Refills   traZODone (DESYREL) 50 MG tablet [Pharmacy Med Name: TRAZODONE 50 MG TABLET] 45 tablet 1    Sig: TAKE 1/2 TABLETS BY MOUTH AT BEDTIME.     Psychiatry: Antidepressants - Serotonin Modulator Passed - 02/18/2023 12:30 PM      Passed - Completed PHQ-2 or PHQ-9 in the last 360 days      Passed - Valid encounter within last 6 months    Recent Outpatient Visits           2 months ago History of anemia   [place] [facility], [place], NP   6 months ago Encounter to establish care   [facility] at [facility], Amy J, NP   9 years ago Low back pain   [facility] Tanya Nones, Priscille Heidelberg, MD   9 years ago Dysuria   [facility] Pickard, Priscille Heidelberg, MD       Future Appointments             In 1 week Kalman Shan, MD [facility] at [facility]

## 2023-02-20 NOTE — Telephone Encounter (Signed)
Complete

## 2023-02-27 ENCOUNTER — Ambulatory Visit: Payer: Medicaid Other | Admitting: Internal Medicine

## 2023-03-02 ENCOUNTER — Other Ambulatory Visit (HOSPITAL_COMMUNITY): Payer: Self-pay

## 2023-03-04 ENCOUNTER — Other Ambulatory Visit: Payer: Self-pay | Admitting: Cardiovascular Disease

## 2023-03-04 DIAGNOSIS — J449 Chronic obstructive pulmonary disease, unspecified: Secondary | ICD-10-CM

## 2023-03-04 DIAGNOSIS — E782 Mixed hyperlipidemia: Secondary | ICD-10-CM

## 2023-03-04 DIAGNOSIS — I341 Nonrheumatic mitral (valve) prolapse: Secondary | ICD-10-CM

## 2023-03-05 DIAGNOSIS — J449 Chronic obstructive pulmonary disease, unspecified: Secondary | ICD-10-CM | POA: Diagnosis not present

## 2023-03-05 DIAGNOSIS — R32 Unspecified urinary incontinence: Secondary | ICD-10-CM | POA: Diagnosis not present

## 2023-03-21 ENCOUNTER — Other Ambulatory Visit: Payer: Self-pay | Admitting: Cardiovascular Disease

## 2023-03-21 ENCOUNTER — Other Ambulatory Visit: Payer: Self-pay | Admitting: Family Medicine

## 2023-03-21 ENCOUNTER — Telehealth: Payer: Self-pay | Admitting: Family

## 2023-03-21 ENCOUNTER — Other Ambulatory Visit: Payer: Self-pay | Admitting: Acute Care

## 2023-03-21 ENCOUNTER — Ambulatory Visit: Payer: Medicaid Other | Admitting: Family Medicine

## 2023-03-21 ENCOUNTER — Telehealth: Payer: Self-pay

## 2023-03-21 ENCOUNTER — Other Ambulatory Visit: Payer: Self-pay | Admitting: Family

## 2023-03-21 ENCOUNTER — Other Ambulatory Visit (HOSPITAL_COMMUNITY): Payer: Self-pay

## 2023-03-21 VITALS — BP 100/62 | HR 88 | Temp 98.1°F | Resp 16 | Wt 102.8 lb

## 2023-03-21 DIAGNOSIS — J42 Unspecified chronic bronchitis: Secondary | ICD-10-CM

## 2023-03-21 DIAGNOSIS — K219 Gastro-esophageal reflux disease without esophagitis: Secondary | ICD-10-CM

## 2023-03-21 DIAGNOSIS — I341 Nonrheumatic mitral (valve) prolapse: Secondary | ICD-10-CM

## 2023-03-21 DIAGNOSIS — E782 Mixed hyperlipidemia: Secondary | ICD-10-CM

## 2023-03-21 DIAGNOSIS — J440 Chronic obstructive pulmonary disease with acute lower respiratory infection: Secondary | ICD-10-CM | POA: Diagnosis not present

## 2023-03-21 DIAGNOSIS — J209 Acute bronchitis, unspecified: Secondary | ICD-10-CM | POA: Diagnosis not present

## 2023-03-21 DIAGNOSIS — N23 Unspecified renal colic: Secondary | ICD-10-CM

## 2023-03-21 DIAGNOSIS — E785 Hyperlipidemia, unspecified: Secondary | ICD-10-CM

## 2023-03-21 DIAGNOSIS — R051 Acute cough: Secondary | ICD-10-CM

## 2023-03-21 DIAGNOSIS — E559 Vitamin D deficiency, unspecified: Secondary | ICD-10-CM

## 2023-03-21 DIAGNOSIS — J449 Chronic obstructive pulmonary disease, unspecified: Secondary | ICD-10-CM

## 2023-03-21 LAB — POCT URINALYSIS DIP (CLINITEK)
Bilirubin, UA: NEGATIVE — AB
Glucose, UA: NEGATIVE mg/dL
Ketones, POC UA: NEGATIVE mg/dL
Leukocytes, UA: NEGATIVE
Nitrite, UA: NEGATIVE
POC PROTEIN,UA: 30 — AB
Spec Grav, UA: 1.02 (ref 1.010–1.025)
Urobilinogen, UA: 0.2 E.U./dL
pH, UA: 7 (ref 5.0–8.0)

## 2023-03-21 MED ORDER — METHYLPREDNISOLONE 4 MG PO TBPK: ORAL_TABLET | ORAL | 0 refills | Status: DC

## 2023-03-21 MED ORDER — BENZONATATE 100 MG PO CAPS: 100.0000 mg | ORAL_CAPSULE | Freq: Three times a day (TID) | ORAL | 0 refills | Status: DC

## 2023-03-21 MED ORDER — AZITHROMYCIN 250 MG PO TABS: ORAL_TABLET | ORAL | 0 refills | Status: AC

## 2023-03-21 NOTE — Progress Notes (Signed)
Patient said she has a cough that has been present for some time- Patient said she has COPD - Patient c/o possible Kidney stones with left side pain.

## 2023-03-21 NOTE — Telephone Encounter (Signed)
Pt called to report that benzonatate (TESSALON) 100 MG capsule is not covered by her insurance, she is requesting an alternative option.

## 2023-03-21 NOTE — Telephone Encounter (Signed)
Pharmacy Patient Advocate Encounter   Received notification from [facility] [employer] that prior authorization for BISOPROLOL is needed.    PA submitted on 03/21/23 Key [id number] Status is pending  Haze Rushing, CPhT Pharmacy Patient Advocate Specialist Direct Number: [phone] Fax: [phone]

## 2023-03-21 NOTE — Telephone Encounter (Signed)
Pharmacy Patient Advocate Encounter  Prior Authorization for BISOPROLOL has been approved.    PA# [id number] Effective dates: 03/21/23 through 03/20/24   Haze Rushing, CPhT Pharmacy Patient Advocate Specialist Direct Number: [phone] Fax: [phone]

## 2023-03-21 NOTE — Telephone Encounter (Signed)
Patient had appointment today with Dr. Andrey Campanile if she will please advise. Thank you.

## 2023-03-21 NOTE — Telephone Encounter (Signed)
Please advise on refill request

## 2023-03-22 ENCOUNTER — Other Ambulatory Visit: Payer: Self-pay | Admitting: Family Medicine

## 2023-03-22 MED ORDER — TRAMADOL HCL 50 MG PO TABS: 50.0000 mg | ORAL_TABLET | Freq: Four times a day (QID) | ORAL | 0 refills | Status: AC | PRN

## 2023-03-22 NOTE — Telephone Encounter (Signed)
Requested Prescriptions  Pending Prescriptions Disp Refills   omeprazole (PRILOSEC) 20 MG capsule [Pharmacy Med Name: OMEPRAZOLE DR 20 MG CAPSULE] 90 capsule 1    Sig: TAKE 1 CAPSULE BY MOUTH EVERY DAY     Gastroenterology: Proton Pump Inhibitors Passed - 03/21/2023  5:56 PM      Passed - Valid encounter within last 12 months    Recent Outpatient Visits           Yesterday Chronic bronchitis, unspecified chronic bronchitis type [facility])   [place] [facility], MD   3 months ago History of anemia   [place] [facility], [place], NP   7 months ago Encounter to establish care   [facility], Amy J, NP   10 years ago Low back pain   [facility] Pickard, Priscille Heidelberg, MD   10 years ago Dysuria   [facility] [place], Priscille Heidelberg, MD       Future Appointments             In 2 weeks Kalman Shan, MD [facility] at [facility]             Vitamin D, Ergocalciferol, (DRISDOL) 1.25 MG (50000 UNIT) CAPS capsule [Pharmacy Med Name: VITAMIN D2 1.25MG (50,000 UNIT)] 12 capsule     Sig: TAKE 1 CAPSULE (50,000 UNITS TOTAL) BY MOUTH EVERY 7 (SEVEN) DAYS FOR 12 DOSES.     Endocrinology:  Vitamins - Vitamin D Supplementation 2 Failed - 03/21/2023  5:56 PM      Failed - Manual Review: Route requests for 50,000 IU strength to the provider      Failed - Ca in normal range and within 360 days    Calcium  Date Value Ref Range Status  02/28/2022 10.1 8.7 - 10.2 mg/dL Final   Calcium, Ion  Date Value Ref Range Status  02/13/2011 1.14 1.12 - 1.32 mmol/L Final         Failed - Vitamin D in normal range and within 360 days    Vit D, 25-Hydroxy  Date Value Ref Range Status  08/18/2022 21.4 (L) 30.0 - 100.0 ng/mL Final    Comment:    Vitamin D deficiency has been defined by the Institute of Medicine and an Endocrine Society practice  guideline as a level of serum 25-OH vitamin D less than 20 ng/mL (1,2). The Endocrine Society went on to further define vitamin D insufficiency as a level between 21 and 29 ng/mL (2). 1. IOM (Institute of Medicine). 2010. Dietary reference    intakes for calcium and D. [place]: The    [employer]. 2. Holick MF, Binkley [place], Bischoff-Ferrari HA, et al.    Evaluation, treatment, and prevention of vitamin D    deficiency: an Endocrine Society clinical practice    guideline. JCEM. 2011 Jul; 96(7):1911-30.          Passed - Valid encounter within last 12 months    Recent Outpatient Visits           Yesterday Chronic bronchitis, unspecified chronic bronchitis type [facility])   [place] [facility], MD   3 months ago History of anemia   [place] [facility] [place], NP   7 months ago Encounter to establish care   [facility] at [facility] [place]  J, NP   10 years ago Low back pain   [facility] Pickard, Priscille Heidelberg, MD   10 years ago Dysuria   [facility] Pickard, Priscille Heidelberg, MD       Future Appointments             In 2 weeks Kalman Shan, MD [facility] at [facility]

## 2023-03-22 NOTE — Telephone Encounter (Signed)
Requested medication (s) are due for refill today: Amount not specified  Requested medication (s) are on the active medication list: yes    Last refill: 03/21/23 ? amount  Future visit scheduled no  Notes to clinic:Not delegated. Cannot refuse non-delegated meds per protocol, please review. Thank you.  Requested Prescriptions  Pending Prescriptions Disp Refills   Vitamin D, Ergocalciferol, (DRISDOL) 1.25 MG (50000 UNIT) CAPS capsule [Pharmacy Med Name: VITAMIN D2 1.25MG (50,000 UNIT)] 12 capsule     Sig: TAKE 1 CAPSULE (50,000 UNITS TOTAL) BY MOUTH EVERY 7 (SEVEN) DAYS FOR 12 DOSES.     Endocrinology:  Vitamins - Vitamin D Supplementation 2 Failed - 03/21/2023  5:56 PM      Failed - Manual Review: Route requests for 50,000 IU strength to the provider      Failed - Ca in normal range and within 360 days    Calcium  Date Value Ref Range Status  02/28/2022 10.1 8.7 - 10.2 mg/dL Final   Calcium, Ion  Date Value Ref Range Status  02/13/2011 1.14 1.12 - 1.32 mmol/L Final         Failed - Vitamin D in normal range and within 360 days    Vit D, 25-Hydroxy  Date Value Ref Range Status  08/18/2022 21.4 (L) 30.0 - 100.0 ng/mL Final    Comment:    Vitamin D deficiency has been defined by the Institute of Medicine and an Endocrine Society practice guideline as a level of serum 25-OH vitamin D less than 20 ng/mL (1,2). The Endocrine Society went on to further define vitamin D insufficiency as a level between 21 and 29 ng/mL (2). 1. IOM (Institute of Medicine). 2010. Dietary reference    intakes for calcium and D. [place]: The    [employer]. 2. Holick MF, Binkley [place], Bischoff-Ferrari HA, et al.    Evaluation, treatment, and prevention of vitamin D    deficiency: an Endocrine Society clinical practice    guideline. JCEM. 2011 Jul; 96(7):1911-30.          Passed - Valid encounter within last 12 months    Recent Outpatient Visits           Yesterday Chronic  bronchitis, unspecified chronic bronchitis type [facility])   [place] [facility], MD   3 months ago History of anemia   [facility], [place], NP   7 months ago Encounter to establish care   [facility] at [facility], Amy J, NP   10 years ago Low back pain   [facility] [place], Priscille Heidelberg, MD   10 years ago Dysuria   [facility] [place], Priscille Heidelberg, MD       Future Appointments             In 2 weeks Kalman Shan, MD [facility] at [facility] Prescriptions Disp Refills   omeprazole (PRILOSEC) 20 MG capsule 90 capsule 1    Sig: TAKE 1 CAPSULE BY MOUTH EVERY DAY     Gastroenterology: Proton Pump Inhibitors Passed - 03/21/2023  5:56 PM      Passed - Valid encounter within last 12 months    Recent Outpatient Visits           Yesterday Chronic bronchitis, unspecified chronic bronchitis type (HCC)   [place] [facility], MD   3 months ago History of anemia    [facility] at [facility], [place], NP   7 months ago Encounter to establish care   [facility] at [facility], [place], NP   10 years ago Low back pain   [facility] Tanya Nones, Priscille Heidelberg, MD   10 years ago Dysuria   [facility] [place], Priscille Heidelberg, MD       Future Appointments             In 2 weeks Kalman Shan, MD [facility] Pulmonary Care at [facility]

## 2023-03-27 ENCOUNTER — Encounter: Payer: Self-pay | Admitting: Family Medicine

## 2023-03-27 NOTE — Progress Notes (Signed)
Established Patient Office Visit  Subjective    Patient ID: [name], female    DOB: Dec 02, 1965  Age: 57 y.o. MRN: [id number]  CC: No chief complaint on file.   HPI [name] presents    Outpatient Encounter Medications as of 03/21/2023  Medication Sig   albuterol (PROVENTIL) (2.5 MG/3ML) 0.083% nebulizer solution INHALE 3 ML BY NEBULIZATION EVERY 6 HOURS AS NEEDED FOR WHEEZING OR SHORTNESS OF BREATH   aspirin 81 MG chewable tablet Chew 81 mg by mouth daily.   [EXPIRED] azithromycin (ZITHROMAX) 250 MG tablet Take 2 tablets on day 1, then 1 tablet daily on days 2 through 5   benzonatate (TESSALON) 100 MG capsule Take 1 capsule (100 mg total) by mouth 3 (three) times daily.   bisoprolol (ZEBETA) 5 MG tablet TAKE 1/2 TABLET BY MOUTH DAILY   busPIRone (BUSPAR) 10 MG tablet TAKE 1 TABLET BY MOUTH TWICE A DAY AS NEEDED   cyclobenzaprine (FLEXERIL) 10 MG tablet Take 1 tablet (10 mg total) by mouth 3 (three) times daily as needed for muscle spasms.   ferrous sulfate 325 (65 FE) MG tablet Take 1 tablet (325 mg total) by mouth daily.   Fluticasone-Umeclidin-Vilant (TRELEGY ELLIPTA) 200-62.5-25 MCG/ACT AEPB Inhale 1 puff into the lungs daily.   gabapentin (NEURONTIN) 300 MG capsule Take 1 capsule (300 mg total) by mouth 2 (two) times daily.   HYDROcodone bit-homatropine (HYDROMET) 5-1.5 MG/5ML syrup Take 5 mLs by mouth at bedtime as needed for cough.   lidocaine (LIDODERM) 5 % Place 1 patch onto the skin daily. Remove & Discard patch within 12 hours or as directed by MD   meloxicam (MOBIC) 15 MG tablet TAKE 1 TABLET (15 MG TOTAL) BY MOUTH DAILY.   methylPREDNISolone (MEDROL DOSEPAK) 4 MG TBPK tablet Utilize as recommended po daily   mirabegron ER (MYRBETRIQ) 25 MG TB24 tablet Take 1 tablet (25 mg total) by mouth daily.   nicotine (NICODERM CQ) 21 mg/24hr patch Place 1 patch (21 mg total) onto the skin daily.   nitroGLYCERIN (NITROSTAT) 0.4 MG SL tablet Place 1 tablet (0.4 mg total) under  the tongue every 5 (five) minutes as needed for chest pain.   omeprazole (PRILOSEC) 20 MG capsule TAKE 1 CAPSULE BY MOUTH EVERY DAY   ondansetron (ZOFRAN) 4 MG tablet TAKE AS NEEDED FOR NAUSEA (FOLLOW UP WITH PRIMARY CARE PROVIDER FOR REFILLS)   ondansetron (ZOFRAN-ODT) 4 MG disintegrating tablet 4mg  ODT q4 hours prn nausea/vomit   Pilocarpine HCl (VUITY) 1.25 % SOLN Place 1 drop into both eyes daily.   RESTASIS 0.05 % ophthalmic emulsion Place 1 drop into both eyes 2 (two) times daily.   sertraline (ZOLOFT) 100 MG tablet TAKE 1 TABLET BY MOUTH EVERY DAY   sucralfate (CARAFATE) 1 g tablet Take 1 tablet (1 g total) by mouth 4 (four) times daily -  with meals and at bedtime.   SUMAtriptan (IMITREX) 6 MG/0.5ML SOLN injection Inject 0.5 mLs (6 mg total) into the skin every 2 (two) hours as needed for migraine or headache. May repeat in 2 hours if headache persists or recurs.  Limit to twice per week.   traMADol (ULTRAM) 50 MG tablet Take 1 tablet (50 mg total) by mouth every 6 (six) hours as needed for up to 5 days.   traZODone (DESYREL) 50 MG tablet TAKE 1/2 TABLETS BY MOUTH AT BEDTIME.   VENTOLIN HFA 108 (90 Base) MCG/ACT inhaler INHALE 1-2 PUFFS BY MOUTH EVERY 6 HOURS AS NEEDED FOR WHEEZE  OR SHORTNESS OF BREATH   Vitamin D, Ergocalciferol, (DRISDOL) 1.25 MG (50000 UNIT) CAPS capsule Take 50,000 Units by mouth once a week.   [DISCONTINUED] rosuvastatin (CRESTOR) 40 MG tablet TAKE 1 TABLET BY MOUTH EVERY DAY   [DISCONTINUED] omeprazole (PRILOSEC) 20 MG capsule Take 1 capsule (20 mg total) by mouth daily.   No facility-administered encounter medications on file as of 03/21/2023.    Past Medical History:  Diagnosis Date   Allergy    Anemia    during pregnancies   Anxiety    Arthritis    hands and hip   Cancer (HCC)    CAP (community acquired pneumonia) 01/07/2020   Chronic kidney disease    KIDNEYSTONES   Cigarette smoker 11/05/2018   COPD (chronic obstructive pulmonary disease) (HCC)     Depression    Heart murmur    MITRAL VALVE PROLASP   High cholesterol    History of endometriosis    Incontinence    Low back pain radiating to both legs 07/25/2020   Menopausal symptoms    Menopause 01/09/2012   Migraine    Myocardial infarction (HCC)    silent 3 years ago   Night sweats 10/05/2019   Osteoporosis    Paresthesia of foot, bilateral 03/28/2019   Productive cough 12/25/2019   Renal stone 06/01/2020   Skin cancer    Thyroid disease    Tick bite 02/10/2019    Past Surgical History:  Procedure Laterality Date   ANKLE SURGERY     RIGHT   APPENDECTOMY     AUGMENTATION MAMMAPLASTY     BREAST ENHANCEMENT SURGERY     SALINE   BUNIONECTOMY     BILATERAL FEET   LEFT HEART CATH AND CORONARY ANGIOGRAPHY N/A 04/13/2021   Procedure: LEFT HEART CATH AND CORONARY ANGIOGRAPHY;  Surgeon: Marykay Lex, MD;  Location: [facility] INVASIVE CV LAB;  Service: Cardiovascular;  Laterality: N/A;   TOOTH EXTRACTION N/A 07/11/2021   Procedure: DENTAL RESTORATION / EXTRACTIONS x 13;  Surgeon: Ocie Doyne, DMD;  Location:  SURGERY CENTER;  Service: Oral Surgery;  Laterality: N/A;   TOTAL ABDOMINAL HYSTERECTOMY W/ BILATERAL SALPINGOOPHORECTOMY     TVT  10/30/2005   WISDOM TOOTH EXTRACTION     X 4    Family History  Problem Relation Age of Onset   Diabetes Mother    Heart disease Mother    Hyperlipidemia Mother    Colon polyps Mother    Tuberculosis Father    Alcohol abuse Father    Heart disease Sister    Hyperlipidemia Sister    Heart disease Brother    Hyperlipidemia Brother    Diabetes Maternal Grandmother    Diabetes Maternal Grandfather    Colon cancer Maternal Uncle    Stomach cancer Maternal Uncle     Social History   Socioeconomic History   Marital status: Divorced    Spouse name: Not on file   Number of children: 2   Years of education: 12+2   Highest education level: Associate degree: occupational, Scientist, product/process development, or vocational program  Occupational  History    Comment: Disabled  Tobacco Use   Smoking status: Every Day    Packs/day: 1.00    Years: 34.00    Additional pack years: 0.00    Total pack years: 34.00    Types: Cigarettes    Passive exposure: Current   Smokeless tobacco: Never   Tobacco comments:    0.5-1ppd as of 06/13/22 ep  Vaping Use  Vaping Use: Never used  Substance and Sexual Activity   Alcohol use: Yes    Comment: OCCASIONALLY   Drug use: No   Sexual activity: Yes    Partners: Male    Birth control/protection: Surgical  Other Topics Concern   Not on file  Social History Narrative   ** Merged History Encounter **       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  Trying to get disability. Lives with mom.  Highest level of education:  High school   Social Determinants of Health   Financial Resource Strain: Low Risk  (03/21/2023)   Overall Financial Resource Strain (CARDIA)    Difficulty of Paying Living Expenses: Not very hard  Food Insecurity: Food Insecurity Present (03/21/2023)   Hunger Vital Sign    Worried About Running Out of Food in the Last Year: Never true    Ran Out of Food in the Last Year: Sometimes true  Transportation Needs: No Transportation Needs (03/21/2023)   PRAPARE - Administrator, Civil Service (Medical): No    Lack of Transportation (Non-Medical): No  Physical Activity: Unknown (03/21/2023)   Exercise Vital Sign    Days of Exercise per Week: 0 days    Minutes of Exercise per Session: Not on file  Stress: Stress Concern Present (03/21/2023)   [employer] of Occupational Health - Occupational Stress Questionnaire    Feeling of Stress : Rather much  Social Connections: Socially Isolated (03/21/2023)   Social Connection and Isolation Panel [NHANES]    Frequency of Communication with Friends and Family: Once a week    Frequency of Social Gatherings with Friends and Family: Once a week    Attends Religious Services: Never    Database administrator or  Organizations: No    Attends Engineer, structural: Not on file    Marital Status: Divorced  Intimate Partner Violence: Not At Risk (12/30/2022)   Humiliation, Afraid, Rape, and Kick questionnaire    Fear of Current or Ex-Partner: No    Emotionally Abused: No    Physically Abused: No    Sexually Abused: No    ROS      Objective    BP 100/62   Pulse 88   Temp 98.1 F (36.7 C)   Resp 16   Wt 102 lb 12.8 oz (46.6 kg)   SpO2 98%   BMI 19.66 kg/m   Physical Exam Vitals and nursing note reviewed.  Constitutional:      General: She is not in acute distress. Cardiovascular:     Rate and Rhythm: Normal rate and regular rhythm.  Pulmonary:     Effort: Pulmonary effort is normal.     Breath sounds: Wheezing present.  Abdominal:     Palpations: Abdomen is soft.     Tenderness: There is abdominal tenderness (right flank).  Neurological:     General: No focal deficit present.     Mental Status: She is alert and oriented to person, place, and time.         Assessment & Plan:   1. COPD (chronic obstructive pulmonary disease) with acute bronchitis (HCC) Zithromax, medrol dosepak, and tessalon perles prescribed.   2. Kidney pain Clinically unremarkable u/a - POCT URINALYSIS DIP (CLINITEK)    Return if symptoms worsen or fail to improve.   Tommie Raymond, MD

## 2023-03-27 NOTE — Telephone Encounter (Signed)
Rosuvastatin prescribed.

## 2023-03-28 ENCOUNTER — Other Ambulatory Visit (HOSPITAL_COMMUNITY): Payer: Self-pay

## 2023-04-03 ENCOUNTER — Other Ambulatory Visit: Payer: Self-pay | Admitting: Family Medicine

## 2023-04-03 DIAGNOSIS — R32 Unspecified urinary incontinence: Secondary | ICD-10-CM

## 2023-04-03 MED ORDER — MIRABEGRON ER 25 MG PO TB24
25.0000 mg | ORAL_TABLET | Freq: Every day | ORAL | 0 refills | Status: AC
Start: 2023-04-03 — End: ?

## 2023-04-03 NOTE — Telephone Encounter (Signed)
Complete

## 2023-04-05 DIAGNOSIS — R32 Unspecified urinary incontinence: Secondary | ICD-10-CM | POA: Diagnosis not present

## 2023-04-05 DIAGNOSIS — J449 Chronic obstructive pulmonary disease, unspecified: Secondary | ICD-10-CM | POA: Diagnosis not present

## 2023-04-06 ENCOUNTER — Ambulatory Visit: Payer: Medicaid Other | Admitting: Oncology

## 2023-04-06 ENCOUNTER — Other Ambulatory Visit: Payer: Medicaid Other

## 2023-04-07 ENCOUNTER — Other Ambulatory Visit: Payer: Self-pay | Admitting: Family

## 2023-04-07 ENCOUNTER — Other Ambulatory Visit: Payer: Self-pay | Admitting: Cardiovascular Disease

## 2023-04-07 DIAGNOSIS — E782 Mixed hyperlipidemia: Secondary | ICD-10-CM

## 2023-04-07 DIAGNOSIS — J449 Chronic obstructive pulmonary disease, unspecified: Secondary | ICD-10-CM

## 2023-04-07 DIAGNOSIS — G4700 Insomnia, unspecified: Secondary | ICD-10-CM

## 2023-04-07 DIAGNOSIS — I341 Nonrheumatic mitral (valve) prolapse: Secondary | ICD-10-CM

## 2023-04-08 ENCOUNTER — Other Ambulatory Visit: Payer: Self-pay | Admitting: Family

## 2023-04-08 DIAGNOSIS — G4700 Insomnia, unspecified: Secondary | ICD-10-CM

## 2023-04-09 ENCOUNTER — Other Ambulatory Visit (HOSPITAL_COMMUNITY): Payer: Self-pay

## 2023-04-09 NOTE — Telephone Encounter (Signed)
Requested Prescriptions  Pending Prescriptions Disp Refills   traZODone (DESYREL) 50 MG tablet [Pharmacy Med Name: TRAZODONE 50 MG TABLET] 45 tablet 0    Sig: TAKE 1/2 TABLET DAILY BY MOUTH AT BEDTIME     Psychiatry: Antidepressants - Serotonin Modulator Passed - 04/07/2023  7:11 PM      Passed - Completed PHQ-2 or PHQ-9 in the last 360 days      Passed - Valid encounter within last 6 months    Recent Outpatient Visits           2 weeks ago COPD (chronic obstructive pulmonary disease) with acute bronchitis (HCC)   [place] [facility], [place]   3 months ago History of anemia   [place] [facility], [place], NP   7 months ago Encounter to establish care   [facility] at [facility], Amy J, NP   10 years ago Low back pain   [facility] Tanya Nones, Priscille Heidelberg, MD   10 years ago Dysuria   [facility] Pickard, Priscille Heidelberg, MD       Future Appointments             In 4 days [name], NP [facility] at [facility]   In 2 weeks Kalman Shan, MD [facility]monary Care at [facility]

## 2023-04-10 ENCOUNTER — Ambulatory Visit: Payer: Medicaid Other | Admitting: Internal Medicine

## 2023-04-10 NOTE — Telephone Encounter (Signed)
Trazodone prescribed 04/09/2023.

## 2023-04-12 NOTE — Progress Notes (Signed)
Erroneous encounter-disregard

## 2023-04-13 ENCOUNTER — Encounter: Payer: Medicaid Other | Admitting: Family

## 2023-04-16 ENCOUNTER — Ambulatory Visit: Payer: Medicaid Other | Admitting: Nurse Practitioner

## 2023-04-25 ENCOUNTER — Other Ambulatory Visit: Payer: Self-pay | Admitting: Family

## 2023-04-25 ENCOUNTER — Telehealth: Payer: Self-pay | Admitting: Family

## 2023-04-25 ENCOUNTER — Encounter: Payer: Medicaid Other | Admitting: Internal Medicine

## 2023-04-25 ENCOUNTER — Other Ambulatory Visit: Payer: Self-pay | Admitting: Cardiovascular Disease

## 2023-04-25 DIAGNOSIS — R202 Paresthesia of skin: Secondary | ICD-10-CM

## 2023-04-25 DIAGNOSIS — E782 Mixed hyperlipidemia: Secondary | ICD-10-CM

## 2023-04-25 DIAGNOSIS — J449 Chronic obstructive pulmonary disease, unspecified: Secondary | ICD-10-CM

## 2023-04-25 DIAGNOSIS — I341 Nonrheumatic mitral (valve) prolapse: Secondary | ICD-10-CM

## 2023-04-25 NOTE — Telephone Encounter (Signed)
Patient returned call about scheduling an appointment for paperwork to be filled out for aero flow, patient stated she cannot come in due to her losing her mom this week, I let patient know I would speak with provider to see if we can try and figure something out for patient. Let pt know I will contact her by end of week to see what we can do.

## 2023-04-25 NOTE — Telephone Encounter (Signed)
Schedule appointment. Can offer video visit if patient would like.

## 2023-04-25 NOTE — Progress Notes (Signed)
 This encounter was created in error - please disregard.

## 2023-04-25 NOTE — Patient Instructions (Signed)
ICD-10-CM   1. COPD with acute exacerbation (HCC)  J44.1     2. Stage 3 severe COPD by GOLD classification (HCC)  J44.9     3. Alpha-1-antitrypsin deficiency carrier  Z14.8     4. Family history of COPD (chronic obstructive pulmonary disease)  Z82.5     5. Current smoker  F17.200     6. At risk from passive smoking  Z91.89     7. Exposure to smoke from wood stove  Z77.29     8. ILD (interstitial lung disease) (HCC)  J84.9       You are in the midst of a COPD exacerbation -probably after running out of Trelegy  Lng function shows decline compared to a year ago  -Could be due to ongoing flareup currently versus alpha-1 MZ   Too bad smoking has relapsed Too bad too many people around to smoke I have concern that your family members have alpha-1  Plan  - Take prednisone 40 mg daily x 2 days, then 20mg daily x 2 days, then 10mg daily x 2 days, then 5mg daily x 2 days and stop  - Take doxycycline 100mg po twice daily x 5 days; take after meals and avoid sunlight  -Restart Trelegy 200  -Take sample but we will also request pharmacy department to do prior authorization  -Continue albuterol as needed  -Quit smoking -talk more in detail about this in the future  -Get high-resolution CT chest supine and prone in 2-3 months  - consider bronch v biopsy based on results  -Repeat spirometry with and without bronchodilator in 2-3 months  -Please recommend to family members to get tested for alpha-1  -Can consider you for COPD research studies in the future   Followup  = 2-3 months Dr. Nawaf Strange   - might need cardiac evaluation if CT shows any cardiac issues -You need regular close follow-up with us in the setting of smoking and alpha-1 

## 2023-04-26 NOTE — Telephone Encounter (Signed)
Called Pt to schedule appointment to have paperwork signed, informed by Pt, her mom has passed and she will contact us when she is ready to schedule.

## 2023-04-26 NOTE — Telephone Encounter (Signed)
Requested Prescriptions  Pending Prescriptions Disp Refills   gabapentin (NEURONTIN) 300 MG capsule [Pharmacy Med Name: GABAPENTIN 300 MG CAPSULE] 60 capsule 2    Sig: TAKE 1 CAPSULE BY MOUTH TWICE A DAY     Neurology: Anticonvulsants - gabapentin Failed - 04/25/2023 10:07 AM      Failed - Cr in normal range and within 360 days    Creat  Date Value Ref Range Status  10/11/2011 0.66 0.50 - 1.10 mg/dL Final   Creatinine, Ser  Date Value Ref Range Status  02/28/2022 0.81 0.57 - 1.00 mg/dL Final         Passed - Completed PHQ-2 or PHQ-9 in the last 360 days      Passed - Valid encounter within last 12 months    Recent Outpatient Visits           1 month ago COPD (chronic obstructive pulmonary disease) with acute bronchitis (HCC)   [place] [facility], MD   4 months ago History of anemia   [place] [facility], [place], NP   8 months ago Encounter to establish care   [facility], Amy J, NP   10 years ago Low back pain   [facility] Tanya Nones, Priscille Heidelberg, MD   10 years ago Dysuria   [facility] Pickard, Priscille Heidelberg, MD       Future Appointments             In 2 months Kalman Shan, MD [facility]monary Care at [facility]

## 2023-04-30 ENCOUNTER — Ambulatory Visit: Payer: Self-pay

## 2023-04-30 NOTE — Telephone Encounter (Signed)
      Chief Complaint: Body aches, fatigue. Lost mother last week. "I think it's from losing my Mom."  Symptoms: Above Frequency: Last week Pertinent Negatives: Patient denies  Disposition: [] ED /[] Urgent Care (no appt availability in office) / [x] Appointment(In office/virtual)/ []  [facility]/ [] Home Care/ [] Refused Recommended Disposition /[] [facility]/ []  Follow-up with PCP Additional Notes: Will call back for worsening of symptoms.  Reason for Disposition  [1] MODERATE pain (e.g., interferes with normal activities) AND [2] present > 3 days  Answer Assessment - Initial Assessment Questions 1. ONSET: "When did the muscle aches or body pains start?"      2 days ago 2. LOCATION: "What part of your body is hurting?" (e.g., entire body, arms, legs)      All over 3. SEVERITY: "How bad is the pain?" (Scale 1-10; or mild, moderate, severe)   - MILD (1-3): doesn't interfere with normal activities    - MODERATE (4-7): interferes with normal activities or awakens from sleep    - SEVERE (8-10):  excruciating pain, unable to do any normal activities      8 4. CAUSE: "What do you think is causing the pains?"     Nerves 5. FEVER: "Have you been having fever?"     No 6. OTHER SYMPTOMS: "Do you have any other symptoms?" (e.g., chest pain, weakness, rash, cold or flu symptoms, weight loss)     Fatigue 7. PREGNANCY: "Is there any chance you are pregnant?" "When was your last menstrual period?"     No 8. TRAVEL: "Have you traveled out of the country in the last month?" (e.g., travel history, exposures)     No  Protocols used: Muscle Aches and Body Pain-A-AH

## 2023-05-04 ENCOUNTER — Ambulatory Visit: Payer: Medicaid Other | Admitting: Family

## 2023-05-04 ENCOUNTER — Telehealth: Payer: Self-pay | Admitting: Family

## 2023-05-04 ENCOUNTER — Encounter: Payer: Self-pay | Admitting: Family

## 2023-05-04 VITALS — BP 111/78 | HR 82 | Temp 98.6°F | Ht 62.0 in | Wt 100.2 lb

## 2023-05-04 DIAGNOSIS — F17200 Nicotine dependence, unspecified, uncomplicated: Secondary | ICD-10-CM | POA: Diagnosis not present

## 2023-05-04 DIAGNOSIS — F419 Anxiety disorder, unspecified: Secondary | ICD-10-CM | POA: Diagnosis not present

## 2023-05-04 DIAGNOSIS — Q6589 Other specified congenital deformities of hip: Secondary | ICD-10-CM

## 2023-05-04 DIAGNOSIS — R110 Nausea: Secondary | ICD-10-CM

## 2023-05-04 DIAGNOSIS — F32A Depression, unspecified: Secondary | ICD-10-CM | POA: Diagnosis not present

## 2023-05-04 DIAGNOSIS — R52 Pain, unspecified: Secondary | ICD-10-CM | POA: Diagnosis not present

## 2023-05-04 DIAGNOSIS — N3946 Mixed incontinence: Secondary | ICD-10-CM | POA: Diagnosis not present

## 2023-05-04 MED ORDER — BUSPIRONE HCL 15 MG PO TABS
15.0000 mg | ORAL_TABLET | Freq: Two times a day (BID) | ORAL | 2 refills | Status: DC | PRN
Start: 2023-05-04 — End: 2023-07-26

## 2023-05-04 MED ORDER — NICOTINE 21 MG/24HR TD PT24: 21.0000 mg | MEDICATED_PATCH | Freq: Every day | TRANSDERMAL | 2 refills | Status: DC

## 2023-05-04 MED ORDER — ONDANSETRON 4 MG PO TBDP
4.0000 mg | ORAL_TABLET | Freq: Three times a day (TID) | ORAL | 2 refills | Status: DC | PRN
Start: 2023-05-04 — End: 2023-08-28

## 2023-05-04 NOTE — Progress Notes (Signed)
Patient ID: [name], female    DOB: 13-Apr-1966  MRN: [id number]  CC: Follow-Up  Subjective: [name] is a 57 y.o. female who presents for follow-up.   Her concerns today include:  - Anxiety depression persisting. Reports her mother recently passed away. Doesn't feel Buspar as effective as she would like. Receiving therapy from therapist [facility]. She denies thoughts of self-harm, suicidal ideations, homicidal ideations. - Body pain of multiple sites. She denies recent trauma/injury and red flag symptoms. Reports body pain has increased since her mother passed away but was present even before then. - History of right hip dysplasia. She was established with Orthopedics in the past and was told she may need hip surgery. She would like a new referral to Orthopedics.  - Incontinence with and without activity. Reports wears medium size adults brief provided by [employer]. - Needs refills of Zofran.  - Needs refills of nicotine patches. - No further issues/concerns for discussion today.   Patient Active Problem List   Diagnosis Date Noted   Iron deficiency 12/30/2022   Vitamin D deficiency 08/19/2022   Urinary incontinence 04/09/2022   ILD (interstitial lung disease) (HCC) 06/27/2021   Unstable angina (HCC) 04/13/2021   Atypical nevi 03/17/2021   Atherosclerosis 03/08/2021   Grief reaction 03/02/2021   Acute diarrhea 12/29/2020   Acute right lumbar radiculopathy 12/11/2020   Healthcare maintenance 12/11/2020   Acute lumbar back pain 07/25/2020   COPD (chronic obstructive pulmonary disease) (HCC) 06/01/2020   GAD (generalized anxiety disorder) 12/26/2019   Poor social situation 08/29/2019   Abnormal chest CT 08/29/2019   Poor dentition 06/25/2019   Lung nodule, solitary 06/25/2019   Chronic cough 11/26/2018   Recent unintentional weight loss over several months 11/05/2018   Current smoker 11/05/2018   Paresthesia 07/22/2018   Skull deformity 07/22/2018   Hyperlipidemia  07/22/2018   Depression 01/09/2012   Migraine with aura 05/09/2007   Mitral valve prolapse 05/09/2007     Current Outpatient Medications on File Prior to Visit  Medication Sig Dispense Refill   albuterol (PROVENTIL) (2.5 MG/3ML) 0.083% nebulizer solution INHALE 3 ML BY NEBULIZATION EVERY 6 HOURS AS NEEDED FOR WHEEZING OR SHORTNESS OF BREATH 75 mL 5   aspirin 81 MG chewable tablet Chew 81 mg by mouth daily.     Fluticasone-Umeclidin-Vilant (TRELEGY ELLIPTA) 200-62.5-25 MCG/ACT AEPB Inhale 1 puff into the lungs daily. 60 each 5   gabapentin (NEURONTIN) 300 MG capsule TAKE 1 CAPSULE BY MOUTH TWICE A DAY 60 capsule 2   HYDROcodone bit-homatropine (HYDROMET) 5-1.5 MG/5ML syrup Take 5 mLs by mouth at bedtime as needed for cough. 120 mL 0   methylPREDNISolone (MEDROL DOSEPAK) 4 MG TBPK tablet Utilize as recommended po daily 21 each 0   nitroGLYCERIN (NITROSTAT) 0.4 MG SL tablet Place 1 tablet (0.4 mg total) under the tongue every 5 (five) minutes as needed for chest pain. 25 tablet 1   omeprazole (PRILOSEC) 20 MG capsule TAKE 1 CAPSULE BY MOUTH EVERY DAY 90 capsule 1   RESTASIS 0.05 % ophthalmic emulsion Place 1 drop into both eyes 2 (two) times daily.     rosuvastatin (CRESTOR) 40 MG tablet TAKE 1 TABLET BY MOUTH EVERY DAY 90 tablet 0   sertraline (ZOLOFT) 100 MG tablet TAKE 1 TABLET BY MOUTH EVERY DAY 30 tablet 2   SUMAtriptan (IMITREX) 6 MG/0.5ML SOLN injection Inject 0.5 mLs (6 mg total) into the skin every 2 (two) hours as needed for migraine or headache. May repeat in  2 hours if headache persists or recurs.  Limit to twice per week. 0.5 mL 8   traZODone (DESYREL) 50 MG tablet TAKE 1/2 TABLET DAILY BY MOUTH AT BEDTIME 45 tablet 0   VENTOLIN HFA 108 (90 Base) MCG/ACT inhaler INHALE 1-2 PUFFS BY MOUTH EVERY 6 HOURS AS NEEDED FOR WHEEZE OR SHORTNESS OF BREATH 18 each 2   Vitamin D, Ergocalciferol, (DRISDOL) 1.25 MG (50000 UNIT) CAPS capsule Take 50,000 Units by mouth once a week.     benzonatate  (TESSALON) 100 MG capsule Take 1 capsule (100 mg total) by mouth 3 (three) times daily. 30 capsule 0   bisoprolol (ZEBETA) 5 MG tablet TAKE 1/2 TABLET BY MOUTH DAILY 15 tablet 0   cyclobenzaprine (FLEXERIL) 10 MG tablet Take 1 tablet (10 mg total) by mouth 3 (three) times daily as needed for muscle spasms. (Patient not taking: Reported on 05/04/2023) 30 tablet 0   ferrous sulfate 325 (65 FE) MG tablet Take 1 tablet (325 mg total) by mouth daily. (Patient not taking: Reported on 05/04/2023) 100 tablet 0   lidocaine (LIDODERM) 5 % Place 1 patch onto the skin daily. Remove & Discard patch within 12 hours or as directed by MD (Patient not taking: Reported on 05/04/2023) 30 patch 0   meloxicam (MOBIC) 15 MG tablet TAKE 1 TABLET (15 MG TOTAL) BY MOUTH DAILY. (Patient not taking: Reported on 05/04/2023) 30 tablet 0   mirabegron ER (MYRBETRIQ) 25 MG TB24 tablet Take 1 tablet (25 mg total) by mouth daily. 90 tablet 0   Pilocarpine HCl (VUITY) 1.25 % SOLN Place 1 drop into both eyes daily. (Patient not taking: Reported on 05/04/2023)     sucralfate (CARAFATE) 1 g tablet Take 1 tablet (1 g total) by mouth 4 (four) times daily -  with meals and at bedtime. 120 tablet 0   No current facility-administered medications on file prior to visit.    Allergies  Allergen Reactions   Dextromethorphan Other (See Comments)    keeps awake, legs constantly moving    Doxylamine Other (See Comments)    keeps awake, legs constantly moving    Pseudoeph-Doxylamine-Dm-Apap Other (See Comments)    REACTION: keeps awake, legs constantly moving  NYQUIL   Pseudoephedrine Hcl Other (See Comments)    keeps awake, legs constantly moving     Social History   Socioeconomic History   Marital status: Divorced    Spouse name: Not on file   Number of children: 2   Years of education: 12+2   Highest education level: Associate degree: occupational, Scientist, product/process development, or vocational program  Occupational History    Comment: Disabled  Tobacco  Use   Smoking status: Every Day    Packs/day: 1.00    Years: 34.00    Additional pack years: 0.00    Total pack years: 34.00    Types: Cigarettes    Passive exposure: Current   Smokeless tobacco: Never   Tobacco comments:    0.5-1ppd as of 06/13/22 ep  Vaping Use   Vaping Use: Never used  Substance and Sexual Activity   Alcohol use: Yes    Comment: OCCASIONALLY   Drug use: No   Sexual activity: Yes    Partners: Male    Birth control/protection: Surgical  Other Topics Concern   Not on file  Social History Narrative   ** Merged History Encounter **       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  Trying to get disability. Lives with mom.  Highest level of education:  High school   Social Determinants of Health   Financial Resource Strain: Low Risk  (03/21/2023)   Overall Financial Resource Strain (CARDIA)    Difficulty of Paying Living Expenses: Not very hard  Food Insecurity: Food Insecurity Present (03/21/2023)   Hunger Vital Sign    Worried About Running Out of Food in the Last Year: Never true    Ran Out of Food in the Last Year: Sometimes true  Transportation Needs: No Transportation Needs (03/21/2023)   PRAPARE - Administrator, Civil Service (Medical): No    Lack of Transportation (Non-Medical): No  Physical Activity: Unknown (03/21/2023)   Exercise Vital Sign    Days of Exercise per Week: 0 days    Minutes of Exercise per Session: Not on file  Stress: Stress Concern Present (03/21/2023)   [employer] of Occupational Health - Occupational Stress Questionnaire    Feeling of Stress : Rather much  Social Connections: Socially Isolated (03/21/2023)   Social Connection and Isolation Panel [NHANES]    Frequency of Communication with Friends and Family: Once a week    Frequency of Social Gatherings with Friends and Family: Once a week    Attends Religious Services: Never    Database administrator or Organizations: No    Attends Museum/gallery exhibitions officer: Not on file    Marital Status: Divorced  Intimate Partner Violence: Not At Risk (12/30/2022)   Humiliation, Afraid, Rape, and Kick questionnaire    Fear of Current or Ex-Partner: No    Emotionally Abused: No    Physically Abused: No    Sexually Abused: No    Family History  Problem Relation Age of Onset   Diabetes Mother    Heart disease Mother    Hyperlipidemia Mother    Colon polyps Mother    Tuberculosis Father    Alcohol abuse Father    Heart disease Sister    Hyperlipidemia Sister    Heart disease Brother    Hyperlipidemia Brother    Diabetes Maternal Grandmother    Diabetes Maternal Grandfather    Colon cancer Maternal Uncle    Stomach cancer Maternal Uncle     Past Surgical History:  Procedure Laterality Date   ANKLE SURGERY     RIGHT   APPENDECTOMY     AUGMENTATION MAMMAPLASTY     BREAST ENHANCEMENT SURGERY     SALINE   BUNIONECTOMY     BILATERAL FEET   LEFT HEART CATH AND CORONARY ANGIOGRAPHY N/A 04/13/2021   Procedure: LEFT HEART CATH AND CORONARY ANGIOGRAPHY;  Surgeon: Marykay Lex, MD;  Location: [facility];  Service: Cardiovascular;  Laterality: N/A;   TOOTH EXTRACTION N/A 07/11/2021   Procedure: DENTAL RESTORATION / EXTRACTIONS x 13;  Surgeon: Ocie Doyne, DMD;  Location: [facility];  Service: Oral Surgery;  Laterality: N/A;   TOTAL ABDOMINAL HYSTERECTOMY W/ BILATERAL SALPINGOOPHORECTOMY     TVT  10/30/2005   WISDOM TOOTH EXTRACTION     X 4    ROS: Review of Systems Negative except as stated above  PHYSICAL EXAM: BP 111/78   Pulse 82   Temp 98.6 F (37 C) (Oral)   Ht 5\' 2"  (1.575 m)   Wt 100 lb 3.2 oz (45.5 kg)   SpO2 94%   BMI 18.33 kg/m   Physical Exam HENT:     Head: Normocephalic and atraumatic.     Nose: Nose normal.     Mouth/Throat:  Mouth: Mucous membranes are moist.     Pharynx: Oropharynx is clear.  Eyes:     Extraocular Movements: Extraocular movements intact.      Conjunctiva/sclera: Conjunctivae normal.     Pupils: Pupils are equal, round, and reactive to light.  Cardiovascular:     Rate and Rhythm: Normal rate and regular rhythm.     Pulses: Normal pulses.     Heart sounds: Normal heart sounds.  Pulmonary:     Effort: Pulmonary effort is normal.     Breath sounds: Normal breath sounds.  Musculoskeletal:        General: Normal range of motion.     Right shoulder: Normal.     Left shoulder: Normal.     Right upper arm: Normal.     Left upper arm: Normal.     Right elbow: Normal.     Left elbow: Normal.     Right forearm: Normal.     Left forearm: Normal.     Right wrist: Normal.     Left wrist: Normal.     Right hand: Normal.     Left hand: Normal.     Cervical back: Normal, normal range of motion and neck supple.     Thoracic back: Normal.     Lumbar back: Normal.     Right hip: Normal.     Left hip: Normal.     Right upper leg: Normal.     Left upper leg: Normal.     Right knee: Normal.     Left knee: Normal.     Right lower leg: Normal.     Left lower leg: Normal.     Right ankle: Normal.     Left ankle: Normal.     Right foot: Normal.     Left foot: Normal.  Neurological:     General: No focal deficit present.     Mental Status: She is alert and oriented to person, place, and time.  Psychiatric:        Mood and Affect: Mood normal.        Behavior: Behavior normal.     ASSESSMENT AND PLAN: 1. Anxiety and depression - Patient denies thoughts of self-harm, suicidal ideations, homicidal ideations. - Increase Buspirone from 10 mg twice daily to 15 mg twice daily. - Keep all scheduled appointments with Hospice therapist. - Referral to Psychiatry for further evaluation/management. During the interim follow-up with primary provider in 4 weeks or sooner if needed until established with referral. - busPIRone (BUSPAR) 15 MG tablet; Take 1 tablet (15 mg total) by mouth 2 (two) times daily as needed.  Dispense: 60 tablet; Refill:  2 - Ambulatory referral to Psychiatry  2. Hip dysplasia - Referral to Orthopedics for further evaluation/management. - Ambulatory referral to Orthopedics  3. Total body pain - Referral to Rheumatology for further evaluation/management. - Ambulatory referral to Rheumatology  4. Mixed stress and urge urinary incontinence - Patient reports she wears adult brief size medium.  - No paperwork from [employer] for completion today in office. - Follow-up with primary provider as scheduled.   5. Nausea - Continue Ondansetron as prescribed. Counseled on medication adherence/adverse effects.  - Follow-up with primary provider as scheduled.  - ondansetron (ZOFRAN-ODT) 4 MG disintegrating tablet; Take 1 tablet (4 mg total) by mouth every 8 (eight) hours as needed for nausea or vomiting.  Dispense: 30 tablet; Refill: 2  6. Current smoker - Continue Nicotine patch as prescribed. Counseled on medication adherence/adverse effects.  -  Follow-up with primary provider as scheduled.  - nicotine (NICODERM CQ) 21 mg/24hr patch; Place 1 patch (21 mg total) onto the skin daily.  Dispense: 28 patch; Refill: 2   Patient was given the opportunity to ask questions.  Patient verbalized understanding of the plan and was able to repeat key elements of the plan. Patient was given clear instructions to go to Emergency Department or return to medical center if symptoms don't improve, worsen, or new problems develop.The patient verbalized understanding.   Orders Placed This Encounter  Procedures   Ambulatory referral to Psychiatry   Ambulatory referral to Orthopedics   Ambulatory referral to Rheumatology     Requested Prescriptions   Signed Prescriptions Disp Refills   busPIRone (BUSPAR) 15 MG tablet 60 tablet 2    Sig: Take 1 tablet (15 mg total) by mouth 2 (two) times daily as needed.   ondansetron (ZOFRAN-ODT) 4 MG disintegrating tablet 30 tablet 2    Sig: Take 1 tablet (4 mg total) by mouth every 8 (eight)  hours as needed for nausea or vomiting.   nicotine (NICODERM CQ) 21 mg/24hr patch 28 patch 2    Sig: Place 1 patch (21 mg total) onto the skin daily.    Follow-up with primary provider as scheduled.   [name], NP

## 2023-05-04 NOTE — Telephone Encounter (Signed)
Copied from CRM [id number]. Topic: General - Other >> May 04, 2023  9:39 AM Epimenio Foot F wrote: Reason for CRM: Belenda Cruise with [employer] [facility] is calling in to check the status of paperwork that was sent over on 06/11.

## 2023-05-04 NOTE — Progress Notes (Signed)
Pt states every thing in her body hurts, and none of her medications are working.  Pt mom has passed away and states since then everything hurts.   Pt states she doesn't eat or anything.  Pt needs refill on Zofran.  Pt needs new nicotine patches.

## 2023-05-07 ENCOUNTER — Telehealth: Payer: Self-pay | Admitting: Oncology

## 2023-05-07 NOTE — Telephone Encounter (Signed)
Patient is aware of rescheduled appointment times/dates 

## 2023-05-11 ENCOUNTER — Other Ambulatory Visit: Payer: Medicaid Other

## 2023-05-11 ENCOUNTER — Ambulatory Visit: Payer: Medicaid Other | Admitting: Oncology

## 2023-05-15 ENCOUNTER — Ambulatory Visit: Payer: Medicaid Other | Admitting: Orthopaedic Surgery

## 2023-05-21 ENCOUNTER — Ambulatory Visit: Payer: Self-pay | Admitting: *Deleted

## 2023-05-21 NOTE — Telephone Encounter (Signed)
FYI

## 2023-05-21 NOTE — Telephone Encounter (Signed)
  Chief Complaint: Has a kidney stone.   Has a history of 20 kidney stones and these are the usual symptoms. Symptoms: Right lower back/flank pain that is radiating around into the front of her abd, blood in urine and nausea. Frequency: Started yesterday Pertinent Negatives: Patient denies having transportation to the ED.    I instructed her to call 911. Disposition: [x] ED /[] Urgent Care (no appt availability in office) / [] Appointment(In office/virtual)/ []  [facility]/ [] Home Care/ [] Refused Recommended Disposition /[]  Mobile Bus/ []  Follow-up with PCP Additional Notes: I have referred this pt to the ED.   She is agreeable to going.   She said she will see if someone can take her if not she'll call 911.

## 2023-05-21 NOTE — Telephone Encounter (Signed)
Message from Graf S sent at 05/21/2023 11:17 AM EDT  Summary: Flank pain, blood in urine, nausea   The patient called in stating as of yesterday she has had blood in her urine. She also has pain in her side that radiates around to the front. She complains of nausea as well. She's states she has a history of kidney stones. Please assist patient further          Call History  Contact Date/Time Type Contact Phone/Fax User  05/21/2023 11:10 AM EDT Phone (Incoming) [name] (Self) [phone] Judie Petit) Kandis Cocking   Reason for Disposition  Patient sounds very sick or weak to the triager    History of 20 kidney stones and these symptoms are just like when she has a kidney stone.  Answer Assessment - Initial Assessment Questions 1. COLOR of URINE: "Describe the color of the urine."  (e.g., tea-colored, pink, red, bloody) "Do you have blood clots in your urine?" (e.g., none, pea, grape, small coin)     I have blood in my urine that started yesterday.   I'm having pain in my right lower back/flank area that is coming around into the front of my abd.   I'm have bad nausea that is worse than usual.     Started yesterday evening.   I've had 20 kidney stones.   2. ONSET: "When did the bleeding start?"      Yesterday evening. 3. EPISODES: "How many times has there been blood in the urine?" or "How many times today?"     No vomiting but I have nausea all the time.   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   The nausea is worse than my normal right now.  4. PAIN with URINATION: "Is there any pain with passing your urine?" If Yes, ask: "How bad is the pain?"  (Scale 1-10; or mild, moderate, severe)    - MILD: Complains slightly about urination hurting.    - MODERATE: Interferes with normal activities.      - SEVERE: Excruciating, unwilling or unable to urinate because of the pain.      Severe pain.     I'm drinking a lot of water.    I know when I see blood in my urine it's probably kidney stones and the pain  that goes with it.  5. FEVER: "Do you have a fever?" If Yes, ask: "What is your temperature, how was it measured, and when did it start?"     Not asked 6. ASSOCIATED SYMPTOMS: "Are you passing urine more frequently than usual?"     Not asked 7. OTHER SYMPTOMS: "Do you have any other symptoms?" (e.g., back/flank pain, abdomen pain, vomiting)     Having right flank/lower back pain that is radiating around into the front.  Blood in urine, nausea and having a lot of pain. 8. PREGNANCY: "Is there any chance you are pregnant?" "When was your last menstrual period?"     Not asked due to age  Protocols used: Urine - Blood In-A-AH

## 2023-05-22 NOTE — Telephone Encounter (Signed)
Report to the Emergency Department for immediate medical evaluation. Follow-up with Primary Care at discharge.

## 2023-05-25 ENCOUNTER — Telehealth: Payer: Self-pay | Admitting: Family

## 2023-05-25 NOTE — Telephone Encounter (Signed)
Waiting on signatures and then will fax them over.

## 2023-05-29 ENCOUNTER — Other Ambulatory Visit (INDEPENDENT_AMBULATORY_CARE_PROVIDER_SITE_OTHER): Payer: Medicaid Other

## 2023-05-29 ENCOUNTER — Ambulatory Visit: Payer: Medicaid Other | Admitting: Orthopaedic Surgery

## 2023-05-29 ENCOUNTER — Encounter: Payer: Self-pay | Admitting: Orthopaedic Surgery

## 2023-05-29 DIAGNOSIS — G8929 Other chronic pain: Secondary | ICD-10-CM | POA: Diagnosis not present

## 2023-05-29 DIAGNOSIS — M25562 Pain in left knee: Secondary | ICD-10-CM | POA: Diagnosis not present

## 2023-05-29 DIAGNOSIS — M25561 Pain in right knee: Secondary | ICD-10-CM

## 2023-05-29 DIAGNOSIS — M25551 Pain in right hip: Secondary | ICD-10-CM

## 2023-05-29 NOTE — Addendum Note (Signed)
Addended by: Wendi Maya on: 05/29/2023 11:09 AM   Modules accepted: Orders

## 2023-05-29 NOTE — Progress Notes (Signed)
Office Visit Note   Patient: [name]           Date of Birth: January 26, 1966           MRN: [id number] Visit Date: 05/29/2023              Requested by: [name], NP [address] [place],  [place] [address] PCP: [name], NP   Assessment & Plan: Visit Diagnoses:  1. Pain in right hip   2. Chronic pain of both knees     Plan: [name] is a 57 year old female with chronic right hip pain and bilateral knee pain.  In regards to the right hip I suspect that she has a labral tear.  She has done physical therapy in the past and had an intra-articular steroid injection without relief.  I will order an MR arthrogram at this time.  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  I have recommended physical therapy for strengthening.  Follow-up after the MRI arthrogram.  Follow-Up Instructions: No follow-ups on file.   Orders:  Orders Placed This Encounter  Procedures  . XR HIP UNILAT W OR W/O PELVIS 2-3 VIEWS RIGHT  . XR KNEE 3 VIEW LEFT  . XR KNEE 3 VIEW RIGHT  . Ambulatory referral to Physical Therapy   No orders of the defined types were placed in this encounter.     Procedures: No procedures performed   Clinical Data: No additional findings.   Subjective: Chief Complaint  Patient presents with  . Right Hip - Pain  . Left Knee - Pain  . Right Knee - Pain    HPI [name] is a pleasant 57 year old female here for evaluation of bilateral knee pain and right hip pain for several years.  She was told by an orthopedic surgeon in [facility] that she had right hip dysplasia and needed to hip surgery.  She did physical therapy in [place] and she also did image guided hip injection up there that was years ago.  She does not recall that she got much relief.  She reports peripatellar knee pain bilaterally worse on the right.  Denies any injuries or changes in activity.  She is currently unemployed.  She lives with her  mother. Review of Systems  Constitutional: Negative.   HENT: Negative.    Eyes: Negative.   Respiratory: Negative.    Cardiovascular: Negative.   Endocrine: Negative.   Musculoskeletal: Negative.   Neurological: Negative.   Hematological: Negative.   Psychiatric/Behavioral: Negative.    All other systems reviewed and are negative.    Objective: Vital Signs: There were no vitals taken for this visit.  Physical Exam Vitals and nursing note reviewed.  Constitutional:      Appearance: She is well-developed.  HENT:     Head: Atraumatic.     Nose: Nose normal.  Eyes:     Extraocular Movements: Extraocular movements intact.  Cardiovascular:     Pulses: Normal pulses.  Pulmonary:     Effort: Pulmonary effort is normal.  Abdominal:     Palpations: Abdomen is soft.  Musculoskeletal:     Cervical back: Neck supple.  Skin:    General: Skin is warm.     Capillary Refill: Capillary refill takes less than 2 seconds.  Neurological:     Mental Status: She is alert. Mental status is at baseline.  Psychiatric:        Behavior: Behavior normal.  Thought Content: Thought content normal.        Judgment: Judgment normal.    Ortho Exam Examination of the right hip shows normal range of motion without significant pain.  She has slight pain with hip flexion and FADIR.  Slight trochanteric tenderness. Examination bilateral knees is nonfocal.  No joint line tenderness.  Normal range of motion.  Collaterals and cruciates are stable.  No joint effusion. Specialty Comments:  No specialty comments available.  Imaging: XR KNEE 3 VIEW RIGHT  Result Date: 05/29/2023 No acute or structural abnormalities  XR KNEE 3 VIEW LEFT  Result Date: 05/29/2023 No acute or structural abnormalities  XR HIP UNILAT W OR W/O PELVIS 2-3 VIEWS RIGHT  Result Date: 05/29/2023 No acute or structural abnormalities.  Questionable very mild hip dysplasia of the right hip.    PMFS History: Patient  Active Problem List   Diagnosis Date Noted  . Iron deficiency 12/30/2022  . Vitamin D deficiency 08/19/2022  . Urinary incontinence 04/09/2022  . ILD (interstitial lung disease) (HCC) 06/27/2021  . Unstable angina (HCC) 04/13/2021  . Atypical nevi 03/17/2021  . Atherosclerosis 03/08/2021  . Grief reaction 03/02/2021  . Acute diarrhea 12/29/2020  . Acute right lumbar radiculopathy 12/11/2020  . Healthcare maintenance 12/11/2020  . Acute lumbar back pain 07/25/2020  . COPD (chronic obstructive pulmonary disease) (HCC) 06/01/2020  . GAD (generalized anxiety disorder) 12/26/2019  . Poor social situation 08/29/2019  . Abnormal chest CT 08/29/2019  . Poor dentition 06/25/2019  . Lung nodule, solitary 06/25/2019  . Chronic cough 11/26/2018  . Recent unintentional weight loss over several months 11/05/2018  . Current smoker 11/05/2018  . Paresthesia 07/22/2018  . 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 Hyperlipidemia 07/22/2018  . Depression 01/09/2012  . Migraine with aura 05/09/2007  . Mitral valve prolapse 05/09/2007   Past Medical History:  Diagnosis Date  . Allergy   . Anemia    during pregnancies  . Anxiety   . Arthritis    hands and hip  . Cancer (HCC)   . CAP (community acquired pneumonia) 01/07/2020  . Chronic kidney disease    KIDNEYSTONES  . Cigarette smoker 11/05/2018  . COPD (chronic obstructive pulmonary disease) (HCC)   . Depression   . Heart murmur    MITRAL VALVE PROLASP  . High cholesterol   . History of endometriosis   . Incontinence   . Low back pain radiating to both legs 07/25/2020  . Menopausal symptoms   . Menopause 01/09/2012  . Migraine   . Myocardial infarction (HCC)    silent 3 years ago  . Night sweats 10/05/2019  . Osteoporosis   . Paresthesia of foot, bilateral 03/28/2019  . Productive cough 12/25/2019  . Renal stone 06/01/2020  . Skin cancer   . Thyroid disease   . Tick bite 02/10/2019    Family History  Problem Relation Age of Onset  .  Diabetes Mother   . Heart disease Mother   . Hyperlipidemia Mother   . Colon polyps Mother   . Tuberculosis Father   . Alcohol abuse Father   . Heart disease Sister   . Hyperlipidemia Sister   . Heart disease Brother   . Hyperlipidemia Brother   . Diabetes Maternal Grandmother   . Diabetes Maternal Grandfather   . Colon cancer Maternal Uncle   . Stomach cancer Maternal Uncle     Past Surgical History:  Procedure Laterality Date  . ANKLE SURGERY     RIGHT  . APPENDECTOMY    .  AUGMENTATION MAMMAPLASTY    . BREAST ENHANCEMENT SURGERY     SALINE  . BUNIONECTOMY     BILATERAL FEET  . LEFT HEART CATH AND CORONARY ANGIOGRAPHY N/A 04/13/2021   Procedure: LEFT HEART CATH AND CORONARY ANGIOGRAPHY;  Surgeon: Marykay Lex, MD;  Location: [facility] INVASIVE CV LAB;  Service: Cardiovascular;  Laterality: N/A;  . TOOTH EXTRACTION N/A 07/11/2021   Procedure: DENTAL RESTORATION / EXTRACTIONS x 13;  Surgeon: Ocie Doyne, DMD;  Location: [facility];  Service: Oral Surgery;  Laterality: N/A;  . TOTAL ABDOMINAL HYSTERECTOMY W/ BILATERAL SALPINGOOPHORECTOMY    . TVT  10/30/2005  . WISDOM TOOTH EXTRACTION     X 4   Social History   Occupational History    Comment: Disabled  Tobacco Use  . Smoking status: Every Day    Current packs/day: 1.00    Average packs/day: 1 pack/day for 34.0 years (34.0 ttl pk-yrs)    Types: Cigarettes    Passive exposure: Current  . Smokeless tobacco: Never  . Tobacco comments:    0.5-1ppd as of 06/13/22 ep  Vaping Use  . Vaping status: Never Used  Substance and Sexual Activity  . Alcohol use: Yes    Comment: OCCASIONALLY  . Drug use: No  . Sexual activity: Yes    Partners: Male    Birth control/protection: Surgical

## 2023-05-31 DIAGNOSIS — J449 Chronic obstructive pulmonary disease, unspecified: Secondary | ICD-10-CM | POA: Diagnosis not present

## 2023-05-31 DIAGNOSIS — R32 Unspecified urinary incontinence: Secondary | ICD-10-CM | POA: Diagnosis not present

## 2023-06-04 ENCOUNTER — Other Ambulatory Visit: Payer: Self-pay | Admitting: Primary Care

## 2023-06-04 ENCOUNTER — Other Ambulatory Visit: Payer: Self-pay

## 2023-06-04 ENCOUNTER — Other Ambulatory Visit: Payer: Self-pay | Admitting: Cardiovascular Disease

## 2023-06-04 ENCOUNTER — Other Ambulatory Visit: Payer: Self-pay | Admitting: Family

## 2023-06-04 DIAGNOSIS — R053 Chronic cough: Secondary | ICD-10-CM

## 2023-06-04 DIAGNOSIS — J449 Chronic obstructive pulmonary disease, unspecified: Secondary | ICD-10-CM

## 2023-06-04 DIAGNOSIS — E782 Mixed hyperlipidemia: Secondary | ICD-10-CM

## 2023-06-04 DIAGNOSIS — I341 Nonrheumatic mitral (valve) prolapse: Secondary | ICD-10-CM

## 2023-06-04 DIAGNOSIS — G8929 Other chronic pain: Secondary | ICD-10-CM

## 2023-06-04 MED ORDER — VITAMIN D (ERGOCALCIFEROL) 1.25 MG (50000 UNIT) PO CAPS
50000.0000 [IU] | ORAL_CAPSULE | ORAL | 0 refills | Status: DC
Start: 2023-06-04 — End: 2024-01-01

## 2023-06-04 MED ORDER — MELOXICAM 15 MG PO TABS
15.0000 mg | ORAL_TABLET | Freq: Every day | ORAL | 0 refills | Status: AC
Start: 2023-06-04 — End: ?

## 2023-06-06 ENCOUNTER — Other Ambulatory Visit: Payer: Self-pay | Admitting: Acute Care

## 2023-06-06 DIAGNOSIS — R051 Acute cough: Secondary | ICD-10-CM

## 2023-06-07 ENCOUNTER — Other Ambulatory Visit: Payer: Self-pay | Admitting: Family

## 2023-06-07 ENCOUNTER — Other Ambulatory Visit: Payer: Self-pay | Admitting: Internal Medicine

## 2023-06-07 ENCOUNTER — Other Ambulatory Visit: Payer: Self-pay | Admitting: Oncology

## 2023-06-07 DIAGNOSIS — Z1231 Encounter for screening mammogram for malignant neoplasm of breast: Secondary | ICD-10-CM

## 2023-06-07 DIAGNOSIS — E611 Iron deficiency: Secondary | ICD-10-CM

## 2023-06-07 NOTE — Progress Notes (Signed)
[facility] Cancer Initial Visit:  Patient Care Team: [name], NP as PCP - General (Nurse Practitioner) Nahser, Deloris Ping, MD as PCP - Cardiology (Cardiology) Glendale Chard, DO as Consulting Physician (Neurology)  CHIEF COMPLAINTS/PURPOSE OF CONSULTATION:  HISTORY OF PRESENTING ILLNESS: [name] 57 y.o. female is here because of  iron deficiency  Medical history notable for anemia during pregnancies, arthritis, nephrolithiasis, COPD, tobacco use, mitral valve prolapse, endometriosis, MI, osteoporosis, tick bite, carious degeneration of multiple teeth  February 17, 2021: Colonoscopy revealed 2 mm polyp in ascending colon removed with cold snare.  Multiple small mouth diverticula in sigmoid colon  December 18, 2022: Presented to emergency room for low iron WBC 10.0 hemoglobin 12.6 platelet count 372 Ferritin 72 iron saturation 8%  December 30, 2022: [place] hematology consult  Not taking oral iron because can not tolerate it.  Was placed on iron sulfate in February 2024 because of GI intolerance; had a similar problem with PNV.  She has never had an EGD.  Amenorrheic and s/p hysterectomy in 2000 for management of dysfunctional uterine bleeding.  Patient is supposed to take ASA daily but in fact takes it only when she has chest pain.  She does not take Mobic regularly and does not take Goody Powders or ibuprofen.  She is not on oral anticoagulants.   Does not take carafate.  Denies hematochezia, melena.  Has hematuria only when passes a kidney stone.  Has constant cravings for ice.    Social:  Divorced.  Disabled.  Tobacco smokes 1 ppd of cigarettes. EtOH on holidays  [facility] Mother alive 67 lung cancer with metastasis, CAD, COPD Father estranged Sister died 25 MVA Sister alive 63 CVA, obese Brother alive 44 HTN, DM Type II, hypercholesterolemia, HTN   June 08 2023:  Scheduled follow up.  Reviewed results of labs with patient.  Mother has recently died.  She lived  with patient who is now settling her affairs  BP low today.  Patient feels lightheaded and tired.  Has gained 5 lbs since last visit.  To see GI next month.  Taking iron sulfate 325 mg daily which she has difficulty tolerating due to nausea and severe constipation.  Down to 1/2 ppd of cigarettes.    Ferritin 23  Folate 17.8 B12 431  Review of Systems - Oncology  MEDICAL HISTORY: Past Medical History:  Diagnosis Date   Allergy    Anemia    during pregnancies   Anxiety    Arthritis    hands and hip   Cancer (HCC)    CAP (community acquired pneumonia) 01/07/2020   Chronic kidney disease    KIDNEYSTONES   Cigarette smoker 11/05/2018   COPD (chronic obstructive pulmonary disease) (HCC)    Depression    Heart murmur    MITRAL VALVE PROLASP   High cholesterol    History of endometriosis    Incontinence    Low back pain radiating to both legs 07/25/2020   Menopausal symptoms    Menopause 01/09/2012   Migraine    Myocardial infarction (HCC)    silent 3 years ago   Night sweats 10/05/2019   Osteoporosis    Paresthesia of foot, bilateral 03/28/2019   Productive cough 12/25/2019   Renal stone 06/01/2020   Skin cancer    Thyroid disease    Tick bite 02/10/2019    SURGICAL HISTORY: Past Surgical History:  Procedure Laterality Date   ANKLE SURGERY     RIGHT  APPENDECTOMY     AUGMENTATION MAMMAPLASTY     BREAST ENHANCEMENT SURGERY     SALINE   BUNIONECTOMY     BILATERAL FEET   LEFT HEART CATH AND CORONARY ANGIOGRAPHY N/A 04/13/2021   Procedure: LEFT HEART CATH AND CORONARY ANGIOGRAPHY;  Surgeon: Marykay Lex, MD;  Location: [facility] INVASIVE CV LAB;  Service: Cardiovascular;  Laterality: N/A;   TOOTH EXTRACTION N/A 07/11/2021   Procedure: DENTAL RESTORATION / EXTRACTIONS x 13;  Surgeon: Ocie Doyne, DMD;  Location: [facility];  Service: Oral Surgery;  Laterality: N/A;   TOTAL ABDOMINAL HYSTERECTOMY W/ BILATERAL SALPINGOOPHORECTOMY     TVT  10/30/2005    WISDOM TOOTH EXTRACTION     X 4    SOCIAL HISTORY: Social History   Socioeconomic History   Marital status: Divorced    Spouse name: Not on file   Number of children: 2   Years of education: 12+2   Highest education level: Associate degree: occupational, Scientist, product/process development, or vocational program  Occupational History    Comment: Disabled  Tobacco Use   Smoking status: Every Day    Current packs/day: 1.00    Average packs/day: 1 pack/day for 34.0 years (34.0 ttl pk-yrs)    Types: Cigarettes    Passive exposure: Current   Smokeless tobacco: Never   Tobacco comments:    0.5-1ppd as of 06/13/22 ep  Vaping Use   Vaping status: Never Used  Substance and Sexual Activity   Alcohol use: Yes    Comment: OCCASIONALLY   Drug use: No   Sexual activity: Yes    Partners: Male    Birth control/protection: Surgical  Other Topics Concern   Not on file  Social History Narrative   ** Merged History Encounter **       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  Trying to get disability. Lives with mom.  Highest level of education:  High school   Social Determinants of Health   Financial Resource Strain: Low Risk  (03/21/2023)   Overall Financial Resource Strain (CARDIA)    Difficulty of Paying Living Expenses: Not very hard  Food Insecurity: Food Insecurity Present (03/21/2023)   Hunger Vital Sign    Worried About Running Out of Food in the Last Year: Never true    Ran Out of Food in the Last Year: Sometimes true  Transportation Needs: No Transportation Needs (03/21/2023)   PRAPARE - Administrator, Civil Service (Medical): No    Lack of Transportation (Non-Medical): No  Physical Activity: Unknown (03/21/2023)   Exercise Vital Sign    Days of Exercise per Week: 0 days    Minutes of Exercise per Session: Not on file  Stress: Stress Concern Present (03/21/2023)   [employer] of Occupational Health - Occupational Stress Questionnaire    Feeling of Stress : Rather much   Social Connections: Socially Isolated (03/21/2023)   Social Connection and Isolation Panel [NHANES]    Frequency of Communication with Friends and Family: Once a week    Frequency of Social Gatherings with Friends and Family: Once a week    Attends Religious Services: Never    Database administrator or Organizations: No    Attends Engineer, structural: Not on file    Marital Status: Divorced  Intimate Partner Violence: Not At Risk (12/30/2022)   Humiliation, Afraid, Rape, and Kick questionnaire    Fear of Current or Ex-Partner: No    Emotionally Abused: No  Physically Abused: No    Sexually Abused: No    FAMILY HISTORY Family History  Problem Relation Age of Onset   Diabetes Mother    Heart disease Mother    Hyperlipidemia Mother    Colon polyps Mother    Tuberculosis Father    Alcohol abuse Father    Heart disease Sister    Hyperlipidemia Sister    Heart disease Brother    Hyperlipidemia Brother    Diabetes Maternal Grandmother    Diabetes Maternal Grandfather    Colon cancer Maternal Uncle    Stomach cancer Maternal Uncle     ALLERGIES:  is allergic to dextromethorphan, doxylamine, pseudoeph-doxylamine-dm-apap, and pseudoephedrine hcl.  MEDICATIONS:  Current Outpatient Medications  Medication Sig Dispense Refill   albuterol (PROVENTIL) (2.5 MG/3ML) 0.083% nebulizer solution INHALE 3 ML BY NEBULIZATION EVERY 6 HOURS AS NEEDED FOR WHEEZING OR SHORTNESS OF BREATH 75 mL 5   aspirin 81 MG chewable tablet Chew 81 mg by mouth daily.     busPIRone (BUSPAR) 15 MG tablet Take 1 tablet (15 mg total) by mouth 2 (two) times daily as needed. 60 tablet 2   cyclobenzaprine (FLEXERIL) 10 MG tablet Take 1 tablet (10 mg total) by mouth 3 (three) times daily as needed for muscle spasms. 30 tablet 0   Fluticasone-Umeclidin-Vilant (TRELEGY ELLIPTA) 200-62.5-25 MCG/ACT AEPB Inhale 1 puff into the lungs daily. 60 each 5   gabapentin (NEURONTIN) 300 MG capsule TAKE 1 CAPSULE BY MOUTH  TWICE A DAY 60 capsule 2   HYDROcodone bit-homatropine (HYDROMET) 5-1.5 MG/5ML syrup Take 5 mLs by mouth at bedtime as needed for cough. 120 mL 0   lidocaine (LIDODERM) 5 % Place 1 patch onto the skin daily. Remove & Discard patch within 12 hours or as directed by MD 30 patch 0   meloxicam (MOBIC) 15 MG tablet TAKE 1 TABLET (15 MG TOTAL) BY MOUTH DAILY. 30 tablet 0   meloxicam (MOBIC) 15 MG tablet Take 1 tablet (15 mg total) by mouth daily. 30 tablet 0   methylPREDNISolone (MEDROL DOSEPAK) 4 MG TBPK tablet Utilize as recommended po daily 21 each 0   mirabegron ER (MYRBETRIQ) 25 MG TB24 tablet Take 1 tablet (25 mg total) by mouth daily. 90 tablet 0   nicotine (NICODERM CQ) 21 mg/24hr patch Place 1 patch (21 mg total) onto the skin daily. 28 patch 2   nitroGLYCERIN (NITROSTAT) 0.4 MG SL tablet Place 1 tablet (0.4 mg total) under the tongue every 5 (five) minutes as needed for chest pain. 25 tablet 1   omeprazole (PRILOSEC) 20 MG capsule TAKE 1 CAPSULE BY MOUTH EVERY DAY 90 capsule 1   ondansetron (ZOFRAN-ODT) 4 MG disintegrating tablet Take 1 tablet (4 mg total) by mouth every 8 (eight) hours as needed for nausea or vomiting. 30 tablet 2   Pilocarpine HCl (VUITY) 1.25 % SOLN Place 1 drop into both eyes daily.     RESTASIS 0.05 % ophthalmic emulsion Place 1 drop into both eyes 2 (two) times daily.     rosuvastatin (CRESTOR) 40 MG tablet TAKE 1 TABLET BY MOUTH EVERY DAY 90 tablet 0   sertraline (ZOLOFT) 100 MG tablet TAKE 1 TABLET BY MOUTH EVERY DAY 30 tablet 2   sulfacetamide (BLEPH-10) 10 % ophthalmic solution Place 1 drop into the right eye 4 (four) times daily. 15 mL 0   SUMAtriptan (IMITREX) 6 MG/0.5ML SOLN injection Inject 0.5 mLs (6 mg total) into the skin every 2 (two) hours as needed for migraine or headache.  May repeat in 2 hours if headache persists or recurs.  Limit to twice per week. 0.5 mL 8   traZODone (DESYREL) 50 MG tablet TAKE 1/2 TABLET DAILY BY MOUTH AT BEDTIME 45 tablet 0    VENTOLIN HFA 108 (90 Base) MCG/ACT inhaler INHALE 1-2 PUFFS BY MOUTH EVERY 6 HOURS AS NEEDED FOR WHEEZE OR SHORTNESS OF BREATH 18 each 2   Vitamin D, Ergocalciferol, (DRISDOL) 1.25 MG (50000 UNIT) CAPS capsule TAKE 1 CAPSULE (50,000 UNITS TOTAL) BY MOUTH EVERY 7 (SEVEN) DAYS FOR 12 DOSES. 12 capsule 0   Vitamin D, Ergocalciferol, (DRISDOL) 1.25 MG (50000 UNIT) CAPS capsule Take 1 capsule (50,000 Units total) by mouth once a week. 5 capsule 0   No current facility-administered medications for this visit.    PHYSICAL EXAMINATION:  ECOG PERFORMANCE STATUS: 0 - Asymptomatic   Vitals:   06/08/23 1030  BP: (!) 94/54  Pulse: 75  Resp: 18  Temp: 98.9 F (37.2 C)  SpO2: 99%     Filed Weights   06/08/23 1030  Weight: 105 lb 14.4 oz (48 kg)      Physical Exam Vitals and nursing note reviewed.  Constitutional:      Appearance: Normal appearance. She is not toxic-appearing or diaphoretic.     Comments: Here with mother.  Smells of cigarette smoke  HENT:     Head: Normocephalic and atraumatic.     Right Ear: External ear normal.     Left Ear: External ear normal.     Nose: Nose normal. No congestion or rhinorrhea.  Eyes:     General: No scleral icterus.    Extraocular Movements: Extraocular movements intact.     Conjunctiva/sclera: Conjunctivae normal.     Pupils: Pupils are equal, round, and reactive to light.  Cardiovascular:     Rate and Rhythm: Normal rate.     Heart sounds: No murmur heard.    No friction rub. No gallop.  Pulmonary:     Effort: Pulmonary effort is normal. No respiratory distress.     Breath sounds: No rhonchi or rales.     Comments: Rales at bases Abdominal:     General: Bowel sounds are normal.     Palpations: Abdomen is soft.  Musculoskeletal:        General: No swelling, tenderness or deformity.     Cervical back: Normal range of motion and neck supple. No rigidity or tenderness.  Lymphadenopathy:     Head:     Right side of head: No submental,  submandibular, tonsillar, preauricular, posterior auricular or occipital adenopathy.     Left side of head: No submental, submandibular, tonsillar, preauricular, posterior auricular or occipital adenopathy.     Cervical: No cervical adenopathy.     Right cervical: No superficial, deep or posterior cervical adenopathy.    Left cervical: No superficial, deep or posterior cervical adenopathy.     Upper Body:     Right upper body: No supraclavicular, axillary, pectoral or epitrochlear adenopathy.     Left upper body: No supraclavicular, axillary, pectoral or epitrochlear adenopathy.  Skin:    General: Skin is warm.     Coloration: Skin is not jaundiced.  Neurological:     General: No focal deficit present.     Mental Status: She is alert and oriented to person, place, and time.     Cranial Nerves: No cranial nerve deficit.  Psychiatric:        Mood and Affect: Mood normal.  Behavior: Behavior normal.        Thought Content: Thought content normal.        Judgment: Judgment normal.      LABORATORY DATA: I have personally reviewed the data as listed:  Appointment on 06/08/2023  Component Date Value Ref Range Status   Vitamin B-12 06/08/2023 431  180 - 914 pg/mL Final   Comment: (NOTE) This assay is not validated for testing neonatal or myeloproliferative syndrome specimens for Vitamin B12 levels. Performed at [facility], [address], [place], [place] [address]    Folate 06/08/2023 17.8  >5.9 ng/mL Final   Performed at [facility], [address] [place], [place] [address]   Ferritin 06/08/2023 23  11 - 307 ng/mL Final   Performed at [facility], [address], [place], [place] [address]    RADIOGRAPHIC STUDIES: I have personally reviewed the radiological images as listed and agree with the findings in the report  No results found.  ASSESSMENT/PLAN   57 y.o. female is here because of low iron  saturation.  Medical history notable for anemia during pregnancies, arthritis, nephrolithiasis, COPD, tobacco use, mitral valve prolapse, endometriosis, MI, osteoporosis, tick bite, carious degeneration of multiple teeth  Low iron saturation  December 18 2022:  Was referred to ED by NP to address the following labs:  hemoglobin 12.6 Ferritin 72 iron saturation 8%  December 30 2022:  Patient reports difficulty tolerating oral iron therefor she does not take it  June 08 2023- Ferritin 23.  Instructed patient to bake low dose oral iron.  Will reassess in the next month or so  Serum iron levels and iron saturation levels vary markedly and even fluctuate from hour to hour, hence they are not a good measure of a patient's iron status.  Ferritin is more suited to this assessment as it provides a measure of storage iron however it is an acute phase reactant.    Therapeutics:  Patient is not anemic nor is she clinically iron deficient therefore she does not require IV iron.  Recommend consideration of low dose oral iron (Solgar gentle iron, Nature made) qod to daily.  Recommend consideration of EGD to evaluate UGIT as a possible source of blood loss   Tobacco use:  Discussed smoking cessation  June 08 2023- Down to 1/2 ppd of cigarettes.      Cancer Staging  No matching staging information was found for the patient.    No problem-specific Assessment & Plan notes found for this encounter.    No orders of the defined types were placed in this encounter.   20  minutes was spent in patient care.  This included time spent preparing to see the patient (e.g., review of tests), obtaining and/or reviewing separately obtained history, counseling and educating the patient/family/caregiver, ordering medications, tests, or procedures; documenting clinical information in the electronic or other health record, independently interpreting results and communicating results to the patient/family/caregiver as well as  coordination of care.       All questions were answered. The patient knows to call the clinic with any problems, questions or concerns.  This note was electronically signed.    Loni Muse, MD  06/25/2023 12:31 PM

## 2023-06-08 ENCOUNTER — Other Ambulatory Visit: Payer: Self-pay

## 2023-06-08 ENCOUNTER — Other Ambulatory Visit (HOSPITAL_COMMUNITY): Payer: Self-pay

## 2023-06-08 ENCOUNTER — Inpatient Hospital Stay: Payer: Medicaid Other | Admitting: Oncology

## 2023-06-08 ENCOUNTER — Inpatient Hospital Stay: Payer: Medicaid Other | Attending: Oncology

## 2023-06-08 VITALS — BP 94/54 | HR 75 | Temp 98.9°F | Resp 18 | Ht 62.0 in | Wt 105.9 lb

## 2023-06-08 DIAGNOSIS — E611 Iron deficiency: Secondary | ICD-10-CM | POA: Insufficient documentation

## 2023-06-08 DIAGNOSIS — Z716 Tobacco abuse counseling: Secondary | ICD-10-CM | POA: Diagnosis not present

## 2023-06-08 DIAGNOSIS — R42 Dizziness and giddiness: Secondary | ICD-10-CM | POA: Diagnosis not present

## 2023-06-08 DIAGNOSIS — R5383 Other fatigue: Secondary | ICD-10-CM | POA: Diagnosis not present

## 2023-06-08 DIAGNOSIS — F17210 Nicotine dependence, cigarettes, uncomplicated: Secondary | ICD-10-CM | POA: Insufficient documentation

## 2023-06-08 DIAGNOSIS — F17200 Nicotine dependence, unspecified, uncomplicated: Secondary | ICD-10-CM

## 2023-06-08 LAB — VITAMIN B12: Vitamin B-12: 431 pg/mL (ref 180–914)

## 2023-06-08 LAB — FOLATE: Folate: 17.8 ng/mL (ref >5.9)

## 2023-06-08 LAB — FERRITIN: Ferritin: 23 ng/mL (ref 11–307)

## 2023-06-08 NOTE — Patient Instructions (Signed)
Instead of taking iron sulfate 325 mg daily use either a women's or children's multivitamin with iron.

## 2023-06-09 ENCOUNTER — Other Ambulatory Visit (HOSPITAL_COMMUNITY): Payer: Self-pay

## 2023-06-14 ENCOUNTER — Telehealth (INDEPENDENT_AMBULATORY_CARE_PROVIDER_SITE_OTHER): Payer: Medicaid Other | Admitting: Family Medicine

## 2023-06-14 ENCOUNTER — Ambulatory Visit: Payer: Self-pay

## 2023-06-14 ENCOUNTER — Encounter: Payer: Self-pay | Admitting: Family Medicine

## 2023-06-14 DIAGNOSIS — H1031 Unspecified acute conjunctivitis, right eye: Secondary | ICD-10-CM

## 2023-06-14 MED ORDER — SULFACETAMIDE SODIUM 10 % OP SOLN: 1.0000 [drp] | Freq: Four times a day (QID) | OPHTHALMIC | 0 refills | Status: DC

## 2023-06-14 NOTE — Progress Notes (Signed)
Virtual Visit via Video Note  I connected with [name] on 06/14/23 at  9:00 AM EDT by a video enabled telemedicine application and verified that I am speaking with the correct person using two identifiers.  Location: Patient: [name] Provider: [place]   I discussed the limitations of evaluation and management by telemedicine and the availability of in person appointments. The patient expressed understanding and agreed to proceed.  History of Present Illness: Patient reports right eye irritation and redness with discharge. She reports some discharge on yesterday but that she awoke with redness and crusting. She denies known exposure. She denies trauma or injury.    Observations/Objective: Eye with minimal redness and no active drainage noted  Assessment and Plan: 1. Acute conjunctivitis of right eye, unspecified acute conjunctivitis type Abx eye drops prescribed.    Follow Up Instructions:    I discussed the assessment and treatment plan with the patient. The patient was provided an opportunity to ask questions and all were answered. The patient agreed with the plan and demonstrated an understanding of the instructions.   The patient was advised to call back or seek an in-person evaluation if the symptoms worsen or if the condition fails to improve as anticipated.  I provided 13 minutes of non-face-to-face time during this encounter.   Tommie Raymond, MD

## 2023-06-14 NOTE — Telephone Encounter (Signed)
Chief Complaint: Eye discharge Symptoms: right eye red, mildly swollen, discharge, pain 7/10 Frequency: onset yesterday and constant  Pertinent Negatives: Patient denies fever, chest pain, SOB, Nausea, vomiting  Disposition: [] ED /[] Urgent Care (no appt availability in office) / [x] Appointment(In office/virtual)/ []  [facility]/ [] Home Care/ [] Refused Recommended Disposition /[] [facility]/ []  Follow-up with PCP Additional Notes: Patient stated she noticed her right eye had discharge coming from it yesterday evening. She woke up this morning and the eye was matted shut and swollen. When she was able to open the eye it was red and painful, pain 7/10. Care Advice was given and patient has been scheduled for a MyChart video visit today at 0900.   Summary: possible pink eye   Patient called in stated she has possible pink eye in the right eye. She is experiencing pain and oozing. Eye was closed with hard crust this morning. Patient request a prescription for he symptoms.     Reason for Disposition  MODERATE eye pain (e.g., interferes with normal activities)  Answer Assessment - Initial Assessment Questions 1. EYE DISCHARGE: "Is the discharge in one or both eyes?" "What color is it?" "How much is there?" "When did the discharge start?"      Right eye is red and matted shut, onset last night  2. REDNESS OF SCLERA: "Is the redness in one or both eyes?" "When did the redness start?"      Right eye onset Today 3. EYELIDS: "Are the eyelids red or swollen?" If Yes, ask: "How much?"      Yes mildly  4. VISION: "Is there any difficulty seeing clearly?"      Normal  5. PAIN: "Is there any pain? If Yes, ask: "How bad is it?" (Scale 1-10; or mild, moderate, severe)    - MILD (1-3): doesn't interfere with normal activities     - MODERATE (4-7): interferes with normal activities or awakens from sleep    - SEVERE (8-10): excruciating pain, unable to do any normal activities        7/10 6. CONTACT LENS: "Do you wear contacts?"     No 7. OTHER SYMPTOMS: "Do you have any other symptoms?" (e.g., fever, runny nose, cough)     Runny nose  Protocols used: Eye - Pus or Discharge-A-AH

## 2023-06-26 ENCOUNTER — Encounter (HOSPITAL_COMMUNITY): Payer: Self-pay

## 2023-06-27 ENCOUNTER — Encounter: Payer: Self-pay | Admitting: Cardiovascular Disease

## 2023-06-27 NOTE — Progress Notes (Signed)
Cardiology Office Note:    Date:  06/27/2023   ID:  [name], [name] October 18, 1966, MRN [id number]  PCP:  [name], NP   [facility] Providers Cardiologist:  Ivin Booty to update primary MD,subspecialty MD or APP then REFRESH:1}    Referring MD: [name], NP   Chief Complaint  Patient presents with   COPD   Mitral Valve Prolapse    Mar 11, 2021:    [name] is a 57 y.o. female with a hx of cigarette smoking, COPD, anxiety. Hx of MVP and HLD She was recently found to have coronary atherosclerosis by CT scan. Still smoking , started at age 54.  Has occasional CP / discomfort Several years ago ( 8 years ago )  Had chest pressure, Radiated up to her jaw . No stress test or cath ( she did not have insurance )  Had   Still having left shoulder pain , radiates down into her chest .   These last a few minutes.  Not associated with eating or drinking , not related to twisting her torso   Does not eat well Becomes nauseated and develops abdominal pain with eating .  ? bowel ischemia  Colonoscopy last month ( Echo)  She did not discuss her post prandial pain   She does not do any cardio exercise .   Takes care of her 4 grandkids.   + family hx of CAD  - mother and father , uncle   Had covid in March 2022.  April 11, 2021 [name] is seen today for follow up of her angina symptoms Significant smoking hx. Extensive wheezing when I met her in May, 2022. + fam hx of CAD No ischemic eval when I first met her because of her wheezing ( not a candidate for Lexiscan) and worldwide IV contrasst shortate - not possible to do a Coronary CTA or cath She is here for follow up .   Has episodes of chest pain ,  intrascapular pain , profuse diaphoresis Is wearing a nicotine patch  Is on ASA 81` Has not tried NTG yet.   We treated her with colchicine for presumed pericarditis  Pain is better.    Will schedule her for cath   Feb 28, 2022  Off and chest pain   Cath on June 2022 showed 20% prox LAD  Is back smoking  Has MVP   Aug. 29, 2024 [name] is seen for follow up of her CP, dyspnea,  has nonobstructive CAD by cath   Has COPD    Past Medical History:  Diagnosis Date   Allergy    Anemia    during pregnancies   Anxiety    Arthritis    hands and hip   Cancer (HCC)    CAP (community acquired pneumonia) 01/07/2020   Chronic kidney disease    KIDNEYSTONES   Cigarette smoker 11/05/2018   COPD (chronic obstructive pulmonary disease) (HCC)    Depression    Heart murmur    MITRAL VALVE PROLASP   High cholesterol    History of endometriosis    Incontinence    Low back pain radiating to both legs 07/25/2020   Menopausal symptoms    Menopause 01/09/2012   Migraine    Myocardial infarction (HCC)    silent 3 years ago   Night sweats 10/05/2019   Osteoporosis    Paresthesia of foot, bilateral 03/28/2019   Productive cough 12/25/2019   Renal stone 06/01/2020  Skin cancer    Thyroid disease    Tick bite 02/10/2019    Past Surgical History:  Procedure Laterality Date   ANKLE SURGERY     RIGHT   APPENDECTOMY     AUGMENTATION MAMMAPLASTY     BREAST ENHANCEMENT SURGERY     SALINE   BUNIONECTOMY     BILATERAL FEET   LEFT HEART CATH AND CORONARY ANGIOGRAPHY N/A 04/13/2021   Procedure: LEFT HEART CATH AND CORONARY ANGIOGRAPHY;  Surgeon: Marykay Lex, MD;  Location: [facility] INVASIVE CV LAB;  Service: Cardiovascular;  Laterality: N/A;   TOOTH EXTRACTION N/A 07/11/2021   Procedure: DENTAL RESTORATION / EXTRACTIONS x 13;  Surgeon: Ocie Doyne, DMD;  Location: [facility];  Service: Oral Surgery;  Laterality: N/A;   TOTAL ABDOMINAL HYSTERECTOMY W/ BILATERAL SALPINGOOPHORECTOMY     TVT  10/30/2005   WISDOM TOOTH EXTRACTION     X 4    Current Medications: No outpatient medications have been marked as taking for the 06/28/23 encounter (Office Visit) with Peony Barner, Deloris Ping, MD.     Allergies:   Dextromethorphan,  Doxylamine, Pseudoeph-doxylamine-dm-apap, and Pseudoephedrine hcl   Social History   Socioeconomic History   Marital status: Divorced    Spouse name: Not on file   Number of children: 2   Years of education: 12+2   Highest education level: Associate degree: occupational, Scientist, product/process development, or vocational program  Occupational History    Comment: Disabled  Tobacco Use   Smoking status: Every Day    Current packs/day: 1.00    Average packs/day: 1 pack/day for 34.0 years (34.0 ttl pk-yrs)    Types: Cigarettes    Passive exposure: Current   Smokeless tobacco: Never   Tobacco comments:    0.5-1ppd as of 06/13/22 ep  Vaping Use   Vaping status: Never Used  Substance and Sexual Activity   Alcohol use: Yes    Comment: OCCASIONALLY   Drug use: No   Sexual activity: Yes    Partners: Male    Birth control/protection: Surgical  Other Topics Concern   Not on file  Social History Narrative   ** Merged History Encounter **       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  Trying to get disability. Lives with mom.  Highest level of education:  High school   Social Determinants of Health   Financial Resource Strain: Low Risk  (03/21/2023)   Overall Financial Resource Strain (CARDIA)    Difficulty of Paying Living Expenses: Not very hard  Food Insecurity: Food Insecurity Present (03/21/2023)   Hunger Vital Sign    Worried About Running Out of Food in the Last Year: Never true    Ran Out of Food in the Last Year: Sometimes true  Transportation Needs: No Transportation Needs (03/21/2023)   PRAPARE - Administrator, Civil Service (Medical): No    Lack of Transportation (Non-Medical): No  Physical Activity: Unknown (03/21/2023)   Exercise Vital Sign    Days of Exercise per Week: 0 days    Minutes of Exercise per Session: Not on file  Stress: Stress Concern Present (03/21/2023)   [employer] of Occupational Health - Occupational Stress Questionnaire    Feeling of Stress :  Rather much  Social Connections: Socially Isolated (03/21/2023)   Social Connection and Isolation Panel [NHANES]    Frequency of Communication with Friends and Family: Once a week    Frequency of Social Gatherings with Friends and Family: Once a  week    Attends Religious Services: Never    Active Member of Clubs or Organizations: No    Attends Engineer, structural: Not on file    Marital Status: Divorced     Family History: The patient's family history includes Alcohol abuse in her father; Colon cancer in her maternal uncle; Colon polyps in her mother; Diabetes in her maternal grandfather, maternal grandmother, and mother; Heart disease in her brother, mother, and sister; Hyperlipidemia in her brother, mother, and sister; Stomach cancer in her maternal uncle; Tuberculosis in her father.  ROS:   Please see the history of present illness.     All other systems reviewed and are negative.  EKGs/Labs/Other Studies Reviewed:    The following studies were reviewed today:      Recent Labs: 12/18/2022: Hemoglobin 12.6; Platelets 372  Recent Lipid Panel    Component Value Date/Time   CHOL 219 (H) 02/28/2022 0831   TRIG 115 02/28/2022 0831   HDL 62 02/28/2022 0831   CHOLHDL 3.5 02/28/2022 0831   CHOLHDL 5.9 10/11/2011 1137   VLDL 27 10/11/2011 1137   LDLCALC 137 (H) 02/28/2022 0831     Risk Assessment/Calculations:       Physical Exam:    Physical Exam: There were no vitals taken for this visit.  No BP recorded.  {Refresh Note OR Click here to enter BP  :1}***    GEN:  Well nourished, well developed in no acute distress HEENT: Normal NECK: No JVD; No carotid bruits LYMPHATICS: No lymphadenopathy CARDIAC: RRR ***, no murmurs, rubs, gallops RESPIRATORY:  Clear to auscultation without rales, wheezing or rhonchi  ABDOMEN: Soft, non-tender, non-distended MUSCULOSKELETAL:  No edema; No deformity  SKIN: Warm and dry NEUROLOGIC:  Alert and oriented x 3    EKG:     ASSESSMENT:    No diagnosis found.   PLAN:       Chest pain :    2. COPD -         Medication Adjustments/Labs and Tests Ordered: Current medicines are reviewed at length with the patient today.  Concerns regarding medicines are outlined above.  No orders of the defined types were placed in this encounter.   No orders of the defined types were placed in this encounter.     There are no Patient Instructions on file for this visit.   Signed, Kristeen Miss, MD  06/27/2023 7:31 PM    [facility]

## 2023-06-28 ENCOUNTER — Ambulatory Visit: Payer: Medicaid Other | Admitting: Cardiovascular Disease

## 2023-06-29 ENCOUNTER — Ambulatory Visit (HOSPITAL_COMMUNITY): Payer: Medicaid Other | Admitting: Psychiatry

## 2023-07-01 DIAGNOSIS — R32 Unspecified urinary incontinence: Secondary | ICD-10-CM | POA: Diagnosis not present

## 2023-07-01 DIAGNOSIS — J449 Chronic obstructive pulmonary disease, unspecified: Secondary | ICD-10-CM | POA: Diagnosis not present

## 2023-07-03 ENCOUNTER — Other Ambulatory Visit: Payer: Self-pay | Admitting: Cardiovascular Disease

## 2023-07-03 ENCOUNTER — Other Ambulatory Visit (INDEPENDENT_AMBULATORY_CARE_PROVIDER_SITE_OTHER): Payer: Medicaid Other

## 2023-07-03 ENCOUNTER — Encounter: Payer: Self-pay | Admitting: Internal Medicine

## 2023-07-03 ENCOUNTER — Other Ambulatory Visit: Payer: Self-pay | Admitting: Family

## 2023-07-03 ENCOUNTER — Ambulatory Visit: Payer: Medicaid Other | Admitting: Internal Medicine

## 2023-07-03 VITALS — BP 110/60 | HR 79 | Ht 62.0 in | Wt 107.8 lb

## 2023-07-03 DIAGNOSIS — Z148 Genetic carrier of other disease: Secondary | ICD-10-CM

## 2023-07-03 DIAGNOSIS — R590 Localized enlarged lymph nodes: Secondary | ICD-10-CM | POA: Diagnosis not present

## 2023-07-03 DIAGNOSIS — J441 Chronic obstructive pulmonary disease with (acute) exacerbation: Secondary | ICD-10-CM

## 2023-07-03 DIAGNOSIS — J449 Chronic obstructive pulmonary disease, unspecified: Secondary | ICD-10-CM | POA: Diagnosis not present

## 2023-07-03 DIAGNOSIS — R0609 Other forms of dyspnea: Secondary | ICD-10-CM

## 2023-07-03 DIAGNOSIS — J849 Interstitial pulmonary disease, unspecified: Secondary | ICD-10-CM

## 2023-07-03 DIAGNOSIS — Z7729 Contact with and (suspected ) exposure to other hazardous substances: Secondary | ICD-10-CM

## 2023-07-03 DIAGNOSIS — Z825 Family history of asthma and other chronic lower respiratory diseases: Secondary | ICD-10-CM

## 2023-07-03 DIAGNOSIS — E782 Mixed hyperlipidemia: Secondary | ICD-10-CM

## 2023-07-03 DIAGNOSIS — G8929 Other chronic pain: Secondary | ICD-10-CM

## 2023-07-03 DIAGNOSIS — I341 Nonrheumatic mitral (valve) prolapse: Secondary | ICD-10-CM

## 2023-07-03 LAB — SEDIMENTATION RATE: Sed Rate: 35 mm/h — ABNORMAL HIGH (ref 0–30)

## 2023-07-03 MED ORDER — BREZTRI AEROSPHERE 160-9-4.8 MCG/ACT IN AERO: 2.0000 | INHALATION_SPRAY | Freq: Two times a day (BID) | RESPIRATORY_TRACT | Status: DC

## 2023-07-03 MED ORDER — PREDNISONE 10 MG PO TABS
ORAL_TABLET | ORAL | 0 refills | Status: AC
Start: 2023-07-03 — End: 2023-07-13

## 2023-07-03 NOTE — Telephone Encounter (Signed)
Schedule appointment?

## 2023-07-03 NOTE — Progress Notes (Signed)
OV 02/22/2021  Subjective:  Patient ID: [name], female , DOB: 06/15/1966 , age 57 y.o. , MRN: [id number] , ADDRESS: [address] [place] [address] PCP Natalie Stack, DO Patient Care Team: Natalie Stack, DO as PCP - General (Family Medicine) Natalie Chard, DO as Consulting Physician (Neurology)  This Provider for this visit: Treatment Team:  Attending Provider: Kalman Shan, MD    02/22/2021 -   Chief Complaint  Patient presents with   Consult    Hx of COPD, nodules on CT scan a year or 2 ago.  Pna on 4/2.     HPI [name] 57 y.o. -presents for pulm evaluation because of chronic cough and shortness of breath.  She tells me that she has cough and shortness of breath for few to several years.  Slowly it has been progressive.  In March 2022 she had COVID-19.  Treated as outpatient.  This did not change her baseline symptoms.  She feels symptoms are significant as documented below.  There is associated wheezing.  She is on Trelegy and this helps her.  Albuterol also helps.  She says she has been given a diagnosis of COPD based on CT scan of the chest and symptoms.  She says she has had exacerbations treated by prednisone 3 times as outpatient.  This is also helped her symptoms.  She has over 25 pack smoking history.  Probably over 30 pack.  She quit for many years and then finally started smoking again 5 years ago because of stressors in life.  She finally quit again 2 weeks ago although she says it is very hard to maintain remission.  Even in the years when she quit smoking she was still exposed to the wood smoke.  Her mom continues to smoke and she is exposed to passive smoke.  Her mom has COPD so this family history of COPD.  Unclear if this alpha-1 antitrypsin phenotype.   Of note: She has poor dentition.  She is seen Dr. Allean [name] in [place] [facility].  Given her chronic cough and wheezing and shortness of breath they wanted pulmonary  clearance.  SYMPTOM SCALE - Pulm 02/22/2021   O2 use ra  Shortness of Breath 0 -> 5 scale with 5 being worst (score 6 If unable to do)  At rest 2  Simple tasks - showers, clothes change, eating, shaving 2  Household (dishes, doing bed, laundry) 3.5  Shopping 4  Walking level at own pace 2  Walking up Stairs 3  Total (30-36) Dyspnea Score 18.5  How bad is your cough? 4  How bad is your fatigue 5  How bad is nausea 4  How bad is vomiting?  2  How bad is diarrhea? 0  How bad is anxiety? 5  How bad is depression 5     Exposure hx  - She smoked from age 57 to age 28 x 1 pack/day.  Then she quit till age 80 and for the last 5 years has been smoking 1 pack a day again and then quit 2 weeks ago.  Total smoking history then becomes 57 pack/day  -She owns a large property according to history.  Unclear if it is rule out commercial or residential line.  Nevertheless she does burn a lot of wood on a regular basis constantly and gets exposed to the wood smoke.  Although does not stove is wood smoke exposure there is significant  -No roaches or  mildew in the house.  No mold in the house.  No bird feather in the house.  No pet birds or parakeets.  No gerbils no hamsters.  No down pillow.  No feather jackets.  There are no roaches in the house.  CT Chest data dec 2020   IMPRESSION: Peribronchial thickening with persistent bibasilar ground-glass infiltrates which could be infectious or inflammatory.   Resolution of majority of mucous plugging since previous exam.   Atherosclerotic calcifications including coronary arteries.   Resolution of previously identified 8 mm LEFT upper lobe ground-glass nodule.   Aortic Atherosclerosis (ICD10-I70.0).     Electronically Signed   By: [name] M.D.   On: 10/15/2019 08:59   1 Patient Communication  Component 1 mo ago 3/722  SARS Coronavirus 2 RESULT: POSITIVE Abnormal         PFT  No flowsheet data [name].  Simple office walk 185  feet x  3 laps goal with forehead probe 02/22/2021   O2 used ra  Number laps completed 3  Comments about pace x  Resting Pulse Ox/HR 92% and 77/min  Final Pulse Ox/HR 97% and 88/min  Desaturated </= 88% no  Desaturated <= 3% points no  Got Tachycardic >/= 90/min no  Symptoms at end of test no  Miscellaneous comments x    Results for Natalie, [name] (MRN [id number]) as of 02/22/2021 11:48  Ref. Range 12/10/2020 11:14  Creatinine Latest Ref Range: 0.57 - 1.00 mg/dL 4.40  Results for Natalie, [name] (MRN [id number]) as of 02/22/2021 11:48  Ref. Range 02/13/2011 15:21 12/18/2016 19:51 01/29/2021 22:30  Eosinophils Absolute Latest Ref Range: 0.0 - 0.5 K/uL 0.0 0.2 0.2  Results for Natalie, [name] (MRN [id number]) as of 02/22/2021 11:48  Ref. Range 01/17/2016 10:08  D-Dimer, Quant Latest Ref Range: 0.00 - 0.50 ug/mL-FEU <0.27       03/08/2021 Patient presents today for follow-up. During last visit she was ordered for labs including CBC with diff, IgE, RAST allergy panel and Alpha 1 phenotype. She was also ordered for HRCT. Recommended to continue Trelegy. Pre-op eval for Dr. Allean [name] DDS at follow-up, may need cardiology eval/clearance if imaging shows any significant cardiac findings. CT Imaging showed significant worsening of chronic extensive patchy ground glass opacities throughout both lungs with associated mild patchy subpleural reticulation and mild cylindrical bronchiectasis. Favoring desquamative interstitial pneumonia. Three vessel coronary atherosclerosis.   She feels her breathing has improve some. She still has a np congested cough, this is not new. She is unable to produce mucus. She is still smoking 10 cigarettes a day. She is interested in quitting and has in the past several years ago. Her primary care physican just put her on chantix. She has no PFTs on file. She is needing clearance for dental work, unclear type of anesthesia. She had no issues with recent colonscopy several weeks  ago.    06/09/2021 - Interim hx  Patient presents today for 3 month follow-up with PFTs. Current smoker, she is still smoking 10 cigarettes a day. She is having a hard time quitting, she tells me that everyone in her life smokes around her. She was unable to tolerate chantix d/t nausea/vomiting for 1 month. She reports improvement in breathing with Trelegy. Uses SABA on average once a day. Albuterol helped her wheezing after she took it today for her breathing test. She still has a chronic np cough, intermittent wheezing and dyspnea on exertion. She was unable to take mucinex because she can  not swallow large pills. HRCT imaging in May 2022 showed significant worsening of smoking related ILD changes, at her last visit we stressed importance of smoking cessation. Breathing test today showed moderate-severe restriction and severe diffusion defect. 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 FENO 23.     Pulmonary function testing 06/09/2021-FVC 1.55 (50%), FEV1 1.34 (55%), ratio 87, TLC 76%, DLCOunc 8.09 (43%)  Labs:  02/25/21 - Eos absolute 700; IgE 2. Rast allergy panel negative.  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 Significant worsening of chronic extensive patchy ground-glass opacities throughout both lungs, lower lobe predominant, with associated mild patchy subpleural reticulation and mild cylindrical bronchiectasis. 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 2. Three-vessel coronary atherosclerosis. 3. Diffuse bronchial wall thickening and mild centrilobular emphysema, compatible with the provided history of COPD. 4. Aortic Atherosclerosis (ICD10-I70.0) and Emphysema (ICD10-J43.9).   Exposure hx  - She smoked from age 38 to age 43 x 1 pack/day.  Then she quit till age 69 and for the last 5 years has been smoking 1 pack a day again and then quit 2 weeks ago.  Total smoking history then becomes 23 pack/day   -She owns a  large property according to history.  Unclear if it is rule out commercial or residential line.  Nevertheless she does burn a lot of wood on a regular basis constantly and gets exposed to the wood smoke.  Although does not stove is wood smoke exposure there is significant   -No roaches or mildew in the house.  No mold in the house.  No bird feather in the house.  No pet birds or parakeets.  No gerbils no hamsters.  No down pillow.  No feather jackets.  There are no roaches in the house.   OV 06/13/2022  Subjective:  Patient ID: [name], female , DOB: 1965-11-30 , age 28 y.o. , MRN: [id number] , ADDRESS: [address] [place] [address] PCP Cora Collum, DO Patient Care Team: Cora Collum, DO as PCP - General (Family Medicine) Nahser, Deloris Ping, MD as PCP - Cardiology (Cardiology) Natalie Chard, DO as Consulting Physician (Neurology)  This Provider for this visit: Treatment Team:  Attending Provider: Kalman Shan, MD    06/13/2022 -   Chief Complaint  Patient presents with   Follow-up    PFT performed today.  Pt states she has been having pain in sides and back when she coughs or breathes. States her breathing has been worse.   HPI [name] 58 y.o. -returns for follow-up.  Its been a year since I saw her.  At that time there is concern significantly for DIP which is interstitial lung disease on account of her smoking.  So we also do pulmonary function test.  She did pulmonary function test today.  She has severe obstruction and her FEV1 is significantly declined compared to a year ago..  I personally visualized PFT.  Unable to get this to flow into the epic at this point in time.  She tells me that overall she continues to have significant cough.  She also tries to bring her to phlegm and she can taste it to be of metallic taste but unable to expectorate.  She is on Trelegy but she ran out a few weeks ago because of insurance issues.  She is also  complaining about pain in her scapular region from coughing a lot.  And for the last  few days she has had increased cough, congestion, tightness and increased wheezing.  She feels she is in a COPD exacerbation at this point.  Her smoking has relapsed.  People around her house smoke.  She has extensive family members with COPD.  Her mother is going to see 1 of pulmonary physicians in the office in the next few weeks.  She is interested in research studies.   OV 04/25/2023  Subjective:  Patient ID: [name], female , DOB: 1966/06/06 , age 68 y.o. , MRN: [id number] , ADDRESS: [address] [place] [address] PCP [name], NP Patient Care Team: [name], NP as PCP - General (Nurse Practitioner) Nahser, Deloris Ping, MD as PCP - Cardiology (Cardiology) Natalie Chard, DO as Consulting Physician (Neurology)  This Provider for this visit: Treatment Team:  Attending Provider: Kalman Shan, MD    04/25/2023 -  No chief complaint on file.   HPI [name] 57 y.o. -   OV 07/03/2023  Subjective:  Patient ID: [name], female , DOB: July 09, 1966 , age 59 y.o. , MRN: [id number] , ADDRESS: [address] 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 Kristeen Miss May 2023   #2022  high-resolution CT scan concerning for DIP/ILD  - worse oct 2023 - ? DIP  07/03/2023 -   Chief Complaint  Patient presents with   Follow-up    F/up      HPI [name] 57 y.o. -not seen in over a year.  In October 2023 she had a CT scan and showed worsening ILD.  The ILD features are getting more prominent.  She missed her appointment in June 2024.  This is because a mother who had small cell cancer died.  She was the main caregiver.  She says she continues to smoke.  She is not able to afford Trelegy.  Therefore she is doing albuterol as needed.  She is constantly coughing.  Cough is particularly worse at night but also in the daytime it is dry.  She says she is wheezing all the time.  She is also short of breath but the wheezing has gotten worse and the cough is gotten worse the shortness of breath around the same.  Of note for the last 1 days she is noticing submandibular cervical adenopathy.  Has not seen anybody for this.  There is no sore throat or fever or chills.       SYMPTOM SCALE - Pulm 02/22/2021   06/09/2021  07/03/2023   O2 use ra RA   Shortness of Breath 0 -> 5 scale with 5 being worst (score 6 If unable to do)    At rest 2 2   Simple tasks - showers, clothes change, eating, shaving 2 3   Household (dishes, doing bed, laundry) 3.5 4  Shopping 4 4   Walking level at own pace 2 3   Walking up Stairs 3 4   Total (30-36) Dyspnea Score 18.5 20   How bad is your cough? 4 3   How bad is your fatigue 5 5   How bad is nausea 4 4   How bad is vomiting?   2 2   How bad is diarrhea? 0 0   How bad is anxiety? 5 3   How bad is depression 5 3     CT Chest data from date: *HRCT 08/17/22  - personally visualized and independently interpreted : yes - my findings are: as below  IMPRESSION: 1. The appearance of the lungs is indicative of interstitial lung disease, with some areas of progression compared to the prior study and other areas which appear improved. Overall, the  spectrum of findings on today's examination is technically categorized as probable usual interstitial pneumonia (UIP) per current ATS guidelines. Repeat high-resolution chest CT is suggested in 12 months to assess for temporal changes in the appearance of the lung parenchyma. 2. Mild centrilobular and paraseptal emphysema also noted, compatible with reported clinical history of COPD. 3. Aortic atherosclerosis, in addition to left main and three-vessel coronary artery disease. Please note that although the presence of coronary artery calcium documents the presence of coronary artery disease, the severity of this disease and any potential stenosis cannot be assessed on this non-gated CT examination. Assessment for potential risk factor modification, dietary therapy or pharmacologic therapy may be warranted, if clinically indicated.   Aortic Atherosclerosis (ICD10-I70.0) and Emphysema (ICD10-J43.9).     Electronically Signed   By: Trudie Reed M.D.   On: 08/18/2022 09:43  PFT     Latest Ref Rng & Units 06/13/2022    9:05 AM 06/09/2021    9:05 AM  ILD indicators  FVC-Pre L 1.19  1.61   FVC-Predicted Pre % 38  52   FVC-Post L  1.55   FVC-Predicted Post %  50   TLC L  3.52   TLC Predicted %  76   DLCO uncorrected ml/min/mmHg 7.87  8.09   DLCO UNC %Pred % 42  43   DLCO Corrected ml/min/mmHg 7.87  8.09   DLCO COR %Pred % 42  43       LAB RESULTS last 96 hours No results [name].  LAB RESULTS last 90 days Recent Results (from the past 2160 hour(s))  Vitamin B12     Status: None   Collection Time: 06/08/23  9:43 AM  Result Value Ref Range   Vitamin B-12 431 180 - 914 pg/mL    Comment: (NOTE) This assay is not validated for testing neonatal or myeloproliferative syndrome specimens for Vitamin B12 levels. Performed at [facility], [address], [place], [place] [address]   Folate     Status: None   Collection Time: 06/08/23  9:44 AM  Result Value  Ref Range   Folate 17.8 >5.9 ng/mL    Comment: Performed at [facility], [address] [place], [place] [address]  Ferritin     Status: None   Collection Time: 06/08/23  9:44 AM  Result Value Ref Range   Ferritin 23 11 - 307 ng/mL    Comment: Performed at [facility], [address] [place], [place] [address]         has a past medical history of Allergy, Anemia, Anxiety, Arthritis, Cancer (HCC), CAP (community acquired pneumonia) (01/07/2020), Chronic  kidney disease, Cigarette smoker (11/05/2018), COPD (chronic obstructive pulmonary disease) (HCC), Depression, Heart murmur, High cholesterol, History of endometriosis, Incontinence, Low back pain radiating to both legs (07/25/2020), Menopausal symptoms, Menopause (01/09/2012), Migraine, Myocardial infarction [facility]), Night sweats (10/05/2019), Osteoporosis, Paresthesia of foot, bilateral (03/28/2019), Productive cough (12/25/2019), Renal stone (06/01/2020), Skin cancer, Thyroid disease, and Tick bite (02/10/2019).   reports that she has been smoking cigarettes. She has a 34 pack-year smoking history. She has been exposed to tobacco smoke. She has never used smokeless tobacco.  Past Surgical History:  Procedure Laterality Date   ANKLE SURGERY     RIGHT   APPENDECTOMY     AUGMENTATION MAMMAPLASTY     BREAST ENHANCEMENT SURGERY     SALINE   BUNIONECTOMY     BILATERAL FEET   LEFT HEART CATH AND CORONARY ANGIOGRAPHY N/A 04/13/2021   Procedure: LEFT HEART CATH AND CORONARY ANGIOGRAPHY;  Surgeon: Marykay Lex, MD;  Location: [facility] INVASIVE CV LAB;  Service: Cardiovascular;  Laterality: N/A;   TOOTH EXTRACTION N/A 07/11/2021   Procedure: DENTAL RESTORATION / EXTRACTIONS x 13;  Surgeon: Ocie Doyne, DMD;  Location: [facility];  Service: Oral Surgery;  Laterality: N/A;   TOTAL ABDOMINAL HYSTERECTOMY W/ BILATERAL SALPINGOOPHORECTOMY     TVT  10/30/2005   WISDOM TOOTH EXTRACTION     X  4    Allergies  Allergen Reactions   Dextromethorphan Other (See Comments)    keeps awake, legs constantly moving    Doxylamine Other (See Comments)    keeps awake, legs constantly moving    Pseudoeph-Doxylamine-Dm-Apap Other (See Comments)    REACTION: keeps awake, legs constantly moving  NYQUIL   Pseudoephedrine Hcl Other (See Comments)    keeps awake, legs constantly moving     Immunization History  Administered Date(s) Administered   Influenza,inj,Quad PF,6+ Mos 12/10/2020   Influenza-Unspecified 07/01/2022   Pneumococcal-Unspecified 02/10/2021   Td 05/30/2002   Tdap 11/12/2011, 02/16/2021   Zoster Recombinant(Shingrix) 12/12/2020, 02/16/2021    Family History  Problem Relation Age of Onset   Diabetes Mother    Heart disease Mother    Hyperlipidemia Mother    Colon polyps Mother    Tuberculosis Father    Alcohol abuse Father    Heart disease Sister    Hyperlipidemia Sister    Heart disease Brother    Hyperlipidemia Brother    Diabetes Maternal Grandmother    Diabetes Maternal Grandfather    Colon cancer Maternal Uncle    Stomach cancer Maternal Uncle      Current Outpatient Medications:    albuterol (PROVENTIL) (2.5 MG/3ML) 0.083% nebulizer solution, INHALE 3 ML BY NEBULIZATION EVERY 6 HOURS AS NEEDED FOR WHEEZING OR SHORTNESS OF BREATH, Disp: 75 mL, Rfl: 5   aspirin 81 MG chewable tablet, Chew 81 mg by mouth daily., Disp: , Rfl:    busPIRone (BUSPAR) 15 MG tablet, Take 1 tablet (15 mg total) by mouth 2 (two) times daily as needed., Disp: 60 tablet, Rfl: 2   cyclobenzaprine (FLEXERIL) 10 MG tablet, Take 1 tablet (10 mg total) by mouth 3 (three) times daily as needed for muscle spasms., Disp: 30 tablet, Rfl: 0   gabapentin (NEURONTIN) 300 MG capsule, TAKE 1 CAPSULE BY MOUTH TWICE A DAY, Disp: 60 capsule, Rfl: 2   HYDROcodone bit-homatropine (HYDROMET) 5-1.5 MG/5ML syrup, Take 5 mLs by mouth at bedtime as needed for cough., Disp: 120 mL, Rfl: 0   lidocaine  (LIDODERM) 5 %, Place 1 patch onto the skin daily.  Remove & Discard patch within 12 hours or as directed by MD, Disp: 30 patch, Rfl: 0   meloxicam (MOBIC) 15 MG tablet, TAKE 1 TABLET (15 MG TOTAL) BY MOUTH DAILY., Disp: 30 tablet, Rfl: 0   meloxicam (MOBIC) 15 MG tablet, Take 1 tablet (15 mg total) by mouth daily., Disp: 30 tablet, Rfl: 0   methylPREDNISolone (MEDROL DOSEPAK) 4 MG TBPK tablet, Utilize as recommended po daily, Disp: 21 each, Rfl: 0   mirabegron ER (MYRBETRIQ) 25 MG TB24 tablet, Take 1 tablet (25 mg total) by mouth daily., Disp: 90 tablet, Rfl: 0   nicotine (NICODERM CQ) 21 mg/24hr patch, Place 1 patch (21 mg total) onto the skin daily., Disp: 28 patch, Rfl: 2   nitroGLYCERIN (NITROSTAT) 0.4 MG SL tablet, Place 1 tablet (0.4 mg total) under the tongue every 5 (five) minutes as needed for chest pain., Disp: 25 tablet, Rfl: 1   omeprazole (PRILOSEC) 20 MG capsule, TAKE 1 CAPSULE BY MOUTH EVERY DAY, Disp: 90 capsule, Rfl: 1   ondansetron (ZOFRAN-ODT) 4 MG disintegrating tablet, Take 1 tablet (4 mg total) by mouth every 8 (eight) hours as needed for nausea or vomiting., Disp: 30 tablet, Rfl: 2   Pilocarpine HCl (VUITY) 1.25 % SOLN, Place 1 drop into both eyes daily., Disp: , Rfl:    RESTASIS 0.05 % ophthalmic emulsion, Place 1 drop into both eyes 2 (two) times daily., Disp: , Rfl:    rosuvastatin (CRESTOR) 40 MG tablet, TAKE 1 TABLET BY MOUTH EVERY DAY, Disp: 90 tablet, Rfl: 0   sertraline (ZOLOFT) 100 MG tablet, TAKE 1 TABLET BY MOUTH EVERY DAY, Disp: 30 tablet, Rfl: 2   sulfacetamide (BLEPH-10) 10 % ophthalmic solution, Place 1 drop into the right eye 4 (four) times daily., Disp: 15 mL, Rfl: 0   SUMAtriptan (IMITREX) 6 MG/0.5ML SOLN injection, Inject 0.5 mLs (6 mg total) into the skin every 2 (two) hours as needed for migraine or headache. May repeat in 2 hours if headache persists or recurs.  Limit to twice per week., Disp: 0.5 mL, Rfl: 8   traZODone (DESYREL) 50 MG tablet, TAKE 1/2  TABLET DAILY BY MOUTH AT BEDTIME, Disp: 45 tablet, Rfl: 0   VENTOLIN HFA 108 (90 Base) MCG/ACT inhaler, INHALE 1-2 PUFFS BY MOUTH EVERY 6 HOURS AS NEEDED FOR WHEEZE OR SHORTNESS OF BREATH, Disp: 18 each, Rfl: 2   Vitamin D, Ergocalciferol, (DRISDOL) 1.25 MG (50000 UNIT) CAPS capsule, TAKE 1 CAPSULE (50,000 UNITS TOTAL) BY MOUTH EVERY 7 (SEVEN) DAYS FOR 12 DOSES., Disp: 12 capsule, Rfl: 0   Vitamin D, Ergocalciferol, (DRISDOL) 1.25 MG (50000 UNIT) CAPS capsule, Take 1 capsule (50,000 Units total) by mouth once a week., Disp: 5 capsule, Rfl: 0   Fluticasone-Umeclidin-Vilant (TRELEGY ELLIPTA) 200-62.5-25 MCG/ACT AEPB, Inhale 1 puff into the lungs daily. (Patient not taking: Reported on 07/03/2023), Disp: 60 each, Rfl: 5      Objective:   Vitals:   07/03/23 1126  BP: 110/60  Pulse: 79  SpO2: 96%  Weight: 107 lb 12.8 oz (48.9 kg)  Height: 5\' 2"  (1.575 m)    Estimated body mass index is 19.72 kg/m as calculated from the following:   Height as of this encounter: 5\' 2"  (1.575 m).   Weight as of this encounter: 107 lb 12.8 oz (48.9 kg).  @WEIGHTCHANGE @  Filed Weights   07/03/23 1126  Weight: 107 lb 12.8 oz (48.9 kg)     Physical Exam   General: No distress. Looks well O2 at rest:  no Cane present: no Sitting in wheel chair: no Frail: no Obese: no Neuro: Alert and Oriented x 3. GCS 15. Speech normal Psych: Pleasant Resp:  Barrel Chest - no.  Wheeze - YES, Crackles - YES, No overt respiratory distress CVS: Normal heart sounds. Murmurs - no Ext: Stigmata of Connective Tissue Disease - no HEENT: Normal upper airway. PEERL +. No post nasal drip        Assessment:       ICD-10-CM   1. COPD with acute exacerbation (HCC)  J44.1     2. Stage 3 severe COPD by GOLD classification (HCC)  J44.9     3. Family history of COPD (chronic obstructive pulmonary disease)  Z82.5     4. Alpha-1-antitrypsin deficiency carrier  Z14.8     5. Exposure to smoke from wood stove  Z77.29     6.  ILD (interstitial lung disease) (HCC)  J84.9     7. Cervical adenopathy  R59.0          Plan:     Patient Instructions     ICD-10-CM   1. COPD with acute exacerbation (HCC)  J44.1     2. Stage 3 severe COPD by GOLD classification (HCC)  J44.9     3. Family history of COPD (chronic obstructive pulmonary disease)  Z82.5     4. Alpha-1-antitrypsin deficiency carrier  Z14.8     5. Exposure to smoke from wood stove  Z77.29     6. ILD (interstitial lung disease) (HCC)  J84.9     7. Cervical adenopathy  R59.0        COPD with acute exacerbation (HCC) Stage 3 severe COPD by GOLD classification (HCC) Family history of COPD (chronic obstructive pulmonary disease) Alpha-1-antitrypsin deficiency carrier Exposure to smoke from wood stove   -In ongoing COPD flareup after not being able to afford Trelegy  Plan - Take prednisone 40 mg daily x 2 days, then 20mg  daily x 2 days, then 10mg  daily x 2 days, then 5mg  daily x 2 days and stop -We need to work on you quitting smoking which we can address in the future -Take samples of Breztri inhaler and take it 2 puffs 2 times daily -Use albuterol as needed  ILD (interstitial lung disease) (HCC)  -Concerned that you have ongoing/progressive interstitial lung disease on top of COPD -This was evident in October 2023 CT scan of the chest  Plan - Do high-resolution CT chest supine and prone in 3-4 weeks -Do spirometry or DLCO in 3-4 weeks [can be done at the hospital - Do blood work today  -QuantiFERON gold, ESR, ANA, double-stranded DNA, rheumatoid factor, SSA/SSB, CCP, SCL-70, BNP -Return to see Dr. Marchelle Gearing in 3 to 4 weeks  Cervical adenopathy - new finding 07/03/2023   Plan  -refer ENT   Follow-up - 3-4 weeks but after completing all of the above [facility] after completing PFTs in the hospital as well]   FOLLOWUP Return in about 4 weeks (around 07/31/2023) for 30 min visit, after Cleda Daub and DLCO, after HRCT chest, ILD, with Dr  Marchelle Gearing, Face to Face Visit.    SIGNATURE    Dr. Kalman [name], M.D., F.C.C.P,  Pulmonary and Critical Care Medicine Staff Physician, [facility] Center Director - Interstitial Lung Disease  Program  Pulmonary Fibrosis [facility] [place], [place], [address]  Pager: [phone], If no answer or between  15:00h - 7:00h: call [phone] Telephone:  [phone]  11:56 AM 07/03/2023

## 2023-07-03 NOTE — Patient Instructions (Addendum)
ICD-10-CM   1. COPD with acute exacerbation (HCC)  J44.1     2. Stage 3 severe COPD by GOLD classification (HCC)  J44.9     3. Family history of COPD (chronic obstructive pulmonary disease)  Z82.5     4. Alpha-1-antitrypsin deficiency carrier  Z14.8     5. Exposure to smoke from wood stove  Z77.29     6. ILD (interstitial lung disease) (HCC)  J84.9     7. Cervical adenopathy  R59.0        COPD with acute exacerbation (HCC) Stage 3 severe COPD by GOLD classification (HCC) Family history of COPD (chronic obstructive pulmonary disease) Alpha-1-antitrypsin deficiency carrier Exposure to smoke from wood stove   -In ongoing COPD flareup after not being able to afford Trelegy  Plan - Take prednisone 40 mg daily x 2 days, then 20mg  daily x 2 days, then 10mg  daily x 2 days, then 5mg  daily x 2 days and stop -We need to work on you quitting smoking which we can address in the future -Take samples of Breztri inhaler and take it 2 puffs 2 times daily -Use albuterol as needed  ILD (interstitial lung disease) (HCC)  -Concerned that you have ongoing/progressive interstitial lung disease on top of COPD -This was evident in October 2023 CT scan of the chest  Plan - Do high-resolution CT chest supine and prone in 3-4 weeks -Do spirometry or DLCO in 3-4 weeks [can be done at the hospital - Do blood work today  -QuantiFERON gold, ESR, ANA, double-stranded DNA, rheumatoid factor, SSA/SSB, CCP, SCL-70, BNP -Return to see Dr. Marchelle Gearing in 3 to 4 weeks  Cervical adenopathy - new finding 07/03/2023   Plan  -refer ENT   Follow-up - 3-4 weeks but after completing all of the above [facility] after completing PFTs in the hospital as well]

## 2023-07-03 NOTE — Addendum Note (Signed)
Addended by: Hedda Slade on: 07/03/2023 12:14 PM   Modules accepted: Orders

## 2023-07-03 NOTE — Addendum Note (Signed)
Addended by: Hedda Slade on: 07/03/2023 01:33 PM   Modules accepted: Orders

## 2023-07-03 NOTE — Addendum Note (Signed)
Addended by: Hedda Slade on: 07/03/2023 12:16 PM   Modules accepted: Orders

## 2023-07-04 DIAGNOSIS — Z1231 Encounter for screening mammogram for malignant neoplasm of breast: Secondary | ICD-10-CM

## 2023-07-04 LAB — BRAIN NATRIURETIC PEPTIDE: Pro B Natriuretic peptide (BNP): 22 pg/mL (ref 0.0–100.0)

## 2023-07-05 NOTE — Telephone Encounter (Signed)
See routing comment(s) for message information.

## 2023-07-06 ENCOUNTER — Telehealth: Payer: Self-pay | Admitting: Acute Care

## 2023-07-06 ENCOUNTER — Other Ambulatory Visit: Payer: Self-pay | Admitting: Acute Care

## 2023-07-06 DIAGNOSIS — R051 Acute cough: Secondary | ICD-10-CM

## 2023-07-06 MED ORDER — HYDROCODONE BIT-HOMATROP MBR 5-1.5 MG/5ML PO SOLN
5.0000 mL | Freq: Every evening | ORAL | 0 refills | Status: DC | PRN
Start: 2023-07-06 — End: 2023-10-05

## 2023-07-06 NOTE — Telephone Encounter (Signed)
I have called the patient to ensure she does not use her gabapentin in addition to Hydromet cough syrup. I have checked the DPR, and this mediation was last filled 11/2022. I have refilled it for at bedtime use as needed , 120 cc's.

## 2023-07-07 LAB — ANA+ENA+DNA/DS+SCL 70+SJOSSA/B
ANA Titer 1: NEGATIVE
ENA RNP Ab: <0.2 AI (ref 0.0–0.9)
ENA SM Ab Ser-aCnc: <0.2 AI (ref 0.0–0.9)
ENA SSA (RO) Ab: <0.2 AI (ref 0.0–0.9)
ENA SSB (LA) Ab: <0.2 AI (ref 0.0–0.9)
Scleroderma (Scl-70) (ENA) Antibody, IgG: <0.2 AI (ref 0.0–0.9)
dsDNA Ab: <1 [IU]/mL (ref 0–9)

## 2023-07-07 LAB — QUANTIFERON-TB GOLD PLUS
Mitogen-NIL: 9.74 [IU]/mL
NIL: 0.11 [IU]/mL
QuantiFERON-TB Gold Plus: NEGATIVE
TB1-NIL: <0 [IU]/mL
TB2-NIL: <0 [IU]/mL

## 2023-07-07 LAB — RHEUMATOID FACTOR: Rheumatoid fact SerPl-aCnc: 12 [IU]/mL (ref <14)

## 2023-07-07 LAB — CYCLIC CITRUL PEPTIDE ANTIBODY, IGG: Cyclic Citrullin Peptide Ab: <16 U

## 2023-07-08 ENCOUNTER — Encounter: Payer: Self-pay | Admitting: Internal Medicine

## 2023-07-08 ENCOUNTER — Encounter: Payer: Self-pay | Admitting: Orthopaedic Surgery

## 2023-07-11 ENCOUNTER — Telehealth: Payer: Self-pay | Admitting: Otolaryngology

## 2023-07-11 ENCOUNTER — Telehealth: Payer: Self-pay | Admitting: *Deleted

## 2023-07-11 ENCOUNTER — Encounter: Payer: Self-pay | Admitting: Gastroenterology

## 2023-07-11 ENCOUNTER — Ambulatory Visit: Payer: Medicaid Other | Admitting: Gastroenterology

## 2023-07-11 ENCOUNTER — Other Ambulatory Visit (INDEPENDENT_AMBULATORY_CARE_PROVIDER_SITE_OTHER): Payer: Medicaid Other

## 2023-07-11 VITALS — BP 98/58 | HR 74 | Wt 112.0 lb

## 2023-07-11 DIAGNOSIS — K219 Gastro-esophageal reflux disease without esophagitis: Secondary | ICD-10-CM | POA: Diagnosis not present

## 2023-07-11 DIAGNOSIS — E611 Iron deficiency: Secondary | ICD-10-CM | POA: Diagnosis not present

## 2023-07-11 DIAGNOSIS — R112 Nausea with vomiting, unspecified: Secondary | ICD-10-CM | POA: Diagnosis not present

## 2023-07-11 DIAGNOSIS — K5909 Other constipation: Secondary | ICD-10-CM

## 2023-07-11 LAB — CBC WITH DIFFERENTIAL/PLATELET
Basophils Absolute: 0.1 10*3/uL (ref 0.0–0.1)
Basophils Relative: 0.3 % (ref 0.0–3.0)
Eosinophils Absolute: 0.2 10*3/uL (ref 0.0–0.7)
Eosinophils Relative: 0.8 % (ref 0.0–5.0)
HCT: 40.2 % (ref 36.0–46.0)
Hemoglobin: 12.9 g/dL (ref 12.0–15.0)
Lymphocytes Relative: 10.4 % — ABNORMAL LOW (ref 12.0–46.0)
Lymphs Abs: 1.9 10*3/uL (ref 0.7–4.0)
MCHC: 32 g/dL (ref 30.0–36.0)
MCV: 91.3 fl (ref 78.0–100.0)
Monocytes Absolute: 1.3 10*3/uL — ABNORMAL HIGH (ref 0.1–1.0)
Monocytes Relative: 7 % (ref 3.0–12.0)
Neutro Abs: 15 10*3/uL — ABNORMAL HIGH (ref 1.4–7.7)
Neutrophils Relative %: 81.5 % — ABNORMAL HIGH (ref 43.0–77.0)
Platelets: 349 10*3/uL (ref 150.0–400.0)
RBC: 4.4 Mil/uL (ref 3.87–5.11)
RDW: 17.4 % — ABNORMAL HIGH (ref 11.5–15.5)
WBC: 18.4 10*3/uL (ref 4.0–10.5)

## 2023-07-11 LAB — COMPREHENSIVE METABOLIC PANEL
ALT: 14 U/L (ref 0–35)
AST: 19 U/L (ref 0–37)
Albumin: 4.2 g/dL (ref 3.5–5.2)
Alkaline Phosphatase: 84 U/L (ref 39–117)
BUN: 7 mg/dL (ref 6–23)
CO2: 34 meq/L — ABNORMAL HIGH (ref 19–32)
Calcium: 10.2 mg/dL (ref 8.4–10.5)
Chloride: 100 meq/L (ref 96–112)
Creatinine, Ser: 0.77 mg/dL (ref 0.40–1.20)
GFR: 85.99 mL/min (ref >60.00)
Glucose, Bld: 74 mg/dL (ref 70–99)
Potassium: 4 meq/L (ref 3.5–5.1)
Sodium: 143 meq/L (ref 135–145)
Total Bilirubin: 0.3 mg/dL (ref 0.2–1.2)
Total Protein: 7.3 g/dL (ref 6.0–8.3)

## 2023-07-11 LAB — IBC PANEL
Iron: 33 ug/dL — ABNORMAL LOW (ref 42–145)
Saturation Ratios: 7.4 % — ABNORMAL LOW (ref 20.0–50.0)
TIBC: 448 ug/dL (ref 250.0–450.0)
Transferrin: 320 mg/dL (ref 212.0–360.0)

## 2023-07-11 MED ORDER — LINACLOTIDE 145 MCG PO CAPS: 145.0000 ug | ORAL_CAPSULE | Freq: Every day | ORAL | 3 refills | Status: DC

## 2023-07-11 NOTE — Addendum Note (Signed)
Addended by: Richardson Chiquito on: 07/11/2023 02:30 PM   Modules accepted: Orders

## 2023-07-11 NOTE — Telephone Encounter (Signed)
Pt called and said her lymph nodes are swollen white blood count 18.4 she stated and has an apt on 08-22-23, Pt wants to know if she can be seen sooner. Was not sure if this was urgent and wanted to ask. Thank you

## 2023-07-11 NOTE — Progress Notes (Signed)
1.  Low iron saturation without iron deficiency anemia.  Clinical significance uncertain. 2.  Intermittent nausea and vomiting 3.  Recent colonoscopy as noted 4.  EGD reasonable regarding #1 and #2 above 5.  Supplement with multivitamin with iron.  Iron rich foods as well

## 2023-07-11 NOTE — Telephone Encounter (Signed)
Received a call from [facility] lab indicating that patient had a critical WBC of 18.4 today. Boone Master, PA-C/ordering provider has been notified of this information.

## 2023-07-11 NOTE — Progress Notes (Signed)
Chief Complaint: Iron Deficiency Primary GI MD: Dr. Marina Goodell  HPI: 57 year old female history of COPD (not on oxygen), mitral valve prolapse, others as listed below who was referred to me by [name], NP for a complaint of iron deficiency.    Patient seen 12/2022 by heme-onc for low iron saturation (8%).  At that time she had hemoglobin 12.6, ferritin 72.  Patient is unable to tolerate oral iron.  Heme-onc noted that patient is not anemic or clinically iron deficient and did not require IV iron.  Due to normal ferritin they are not concerned for iron deficiency.  Recommended to retry low-dose oral iron or get EGD for further evaluation.  Patient presents today to obtain EGD.  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  She takes antinausea medication daily which controls her symptoms.  Denies melena/hematochezia.   Reports chronic constipation in which she has 1-2 bowel movements per week.  She takes MiraLAX 2 capfuls daily for 7 days with relief.  She will then go a week without MiraLAX and becomes constipated again and has to take MiraLAX again.  MiraLAX is too expensive for her to take on a regular basis.  She has lower abdominal pain associated with the bowel movements.  PREVIOUS GI WORKUP   February 17, 2021: Colonoscopy revealed 2 mm polyp in ascending colon removed with cold snare. Multiple small mouth diverticula in sigmoid colon   Past Medical History:  Diagnosis Date   Allergy    Anemia    during pregnancies   Anxiety    Arthritis    hands and hip   Cancer (HCC)    CAP (community acquired pneumonia) 01/07/2020   Chronic kidney disease    KIDNEYSTONES   Cigarette smoker 11/05/2018   COPD (chronic obstructive pulmonary disease) (HCC)    Depression    Heart murmur    MITRAL VALVE PROLASP   High cholesterol    History of endometriosis    Incontinence    Low back pain radiating to both legs 07/25/2020   Menopausal symptoms     Menopause 01/09/2012   Migraine    Myocardial infarction (HCC)    silent 3 years ago   Night sweats 10/05/2019   Osteoporosis    Paresthesia of foot, bilateral 03/28/2019   Productive cough 12/25/2019   Renal stone 06/01/2020   Skin cancer    Thyroid disease    Tick bite 02/10/2019    Past Surgical History:  Procedure Laterality Date   ANKLE SURGERY     RIGHT   APPENDECTOMY     AUGMENTATION MAMMAPLASTY     BREAST ENHANCEMENT SURGERY     SALINE   BUNIONECTOMY     BILATERAL FEET   LEFT HEART CATH AND CORONARY ANGIOGRAPHY N/A 04/13/2021   Procedure: LEFT HEART CATH AND CORONARY ANGIOGRAPHY;  Surgeon: Marykay Lex, MD;  Location: [facility] INVASIVE CV LAB;  Service: Cardiovascular;  Laterality: N/A;   TOOTH EXTRACTION N/A 07/11/2021   Procedure: DENTAL RESTORATION / EXTRACTIONS x 13;  Surgeon: Ocie Doyne, DMD;  Location: [facility];  Service: Oral Surgery;  Laterality: N/A;   TOTAL ABDOMINAL HYSTERECTOMY W/ BILATERAL SALPINGOOPHORECTOMY     TVT  10/30/2005   WISDOM TOOTH EXTRACTION     X 4    Current Outpatient Medications  Medication Sig Dispense Refill   albuterol (PROVENTIL) (2.5 MG/3ML) 0.083% nebulizer solution INHALE 3 ML BY NEBULIZATION EVERY 6 HOURS AS NEEDED FOR WHEEZING OR SHORTNESS  OF BREATH 75 mL 5   aspirin 81 MG chewable tablet Chew 81 mg by mouth daily.     Budeson-Glycopyrrol-Formoterol (BREZTRI AEROSPHERE) 160-9-4.8 MCG/ACT AERO Inhale 2 puffs into the lungs in the morning and at bedtime.     busPIRone (BUSPAR) 15 MG tablet Take 1 tablet (15 mg total) by mouth 2 (two) times daily as needed. 60 tablet 2   cyclobenzaprine (FLEXERIL) 10 MG tablet Take 1 tablet (10 mg total) by mouth 3 (three) times daily as needed for muscle spasms. 30 tablet 0   Fluticasone-Umeclidin-Vilant (TRELEGY ELLIPTA) 200-62.5-25 MCG/ACT AEPB Inhale 1 puff into the lungs daily. (Patient not taking: Reported on 07/03/2023) 60 each 5   gabapentin (NEURONTIN) 300 MG capsule TAKE 1  CAPSULE BY MOUTH TWICE A DAY 60 capsule 2   HYDROcodone bit-homatropine (HYDROMET) 5-1.5 MG/5ML syrup Take 5 mLs by mouth at bedtime as needed for cough. 120 mL 0   lidocaine (LIDODERM) 5 % Place 1 patch onto the skin daily. Remove & Discard patch within 12 hours or as directed by MD 30 patch 0   meloxicam (MOBIC) 15 MG tablet TAKE 1 TABLET (15 MG TOTAL) BY MOUTH DAILY. 30 tablet 0   meloxicam (MOBIC) 15 MG tablet Take 1 tablet (15 mg total) by mouth daily. 30 tablet 0   methylPREDNISolone (MEDROL DOSEPAK) 4 MG TBPK tablet Utilize as recommended po daily 21 each 0   mirabegron ER (MYRBETRIQ) 25 MG TB24 tablet Take 1 tablet (25 mg total) by mouth daily. 90 tablet 0   nicotine (NICODERM CQ) 21 mg/24hr patch Place 1 patch (21 mg total) onto the skin daily. 28 patch 2   nitroGLYCERIN (NITROSTAT) 0.4 MG SL tablet Place 1 tablet (0.4 mg total) under the tongue every 5 (five) minutes as needed for chest pain. 25 tablet 1   omeprazole (PRILOSEC) 20 MG capsule TAKE 1 CAPSULE BY MOUTH EVERY DAY 90 capsule 1   ondansetron (ZOFRAN-ODT) 4 MG disintegrating tablet Take 1 tablet (4 mg total) by mouth every 8 (eight) hours as needed for nausea or vomiting. 30 tablet 2   Pilocarpine HCl (VUITY) 1.25 % SOLN Place 1 drop into both eyes daily.     predniSONE (DELTASONE) 10 MG tablet Take 4 tablets (40 mg total) by mouth daily with breakfast for 2 days, THEN 3 tablets (30 mg total) daily with breakfast for 2 days, THEN 2 tablets (20 mg total) daily with breakfast for 2 days, THEN 1 tablet (10 mg total) daily with breakfast for 2 days, THEN 0.5 tablets (5 mg total) daily with breakfast for 2 days. 21 tablet 0   RESTASIS 0.05 % ophthalmic emulsion Place 1 drop into both eyes 2 (two) times daily.     rosuvastatin (CRESTOR) 40 MG tablet TAKE 1 TABLET BY MOUTH EVERY DAY 90 tablet 0   sertraline (ZOLOFT) 100 MG tablet TAKE 1 TABLET BY MOUTH EVERY DAY 30 tablet 2   sulfacetamide (BLEPH-10) 10 % ophthalmic solution Place 1 drop  into the right eye 4 (four) times daily. 15 mL 0   SUMAtriptan (IMITREX) 6 MG/0.5ML SOLN injection Inject 0.5 mLs (6 mg total) into the skin every 2 (two) hours as needed for migraine or headache. May repeat in 2 hours if headache persists or recurs.  Limit to twice per week. 0.5 mL 8   traZODone (DESYREL) 50 MG tablet TAKE 1/2 TABLET DAILY BY MOUTH AT BEDTIME 45 tablet 0   VENTOLIN HFA 108 (90 Base) MCG/ACT inhaler INHALE 1-2 PUFFS BY  MOUTH EVERY 6 HOURS AS NEEDED FOR WHEEZE OR SHORTNESS OF BREATH 18 each 2   Vitamin D, Ergocalciferol, (DRISDOL) 1.25 MG (50000 UNIT) CAPS capsule TAKE 1 CAPSULE (50,000 UNITS TOTAL) BY MOUTH EVERY 7 (SEVEN) DAYS FOR 12 DOSES. 12 capsule 0   Vitamin D, Ergocalciferol, (DRISDOL) 1.25 MG (50000 UNIT) CAPS capsule Take 1 capsule (50,000 Units total) by mouth once a week. 5 capsule 0   No current facility-administered medications for this visit.    Allergies as of 07/11/2023 - Review Complete 07/03/2023  Allergen Reaction Noted   Dextromethorphan Other (See Comments) 01/13/2021   Doxylamine Other (See Comments) 01/13/2021   Pseudoeph-doxylamine-dm-apap Other (See Comments)    Pseudoephedrine hcl Other (See Comments) 01/13/2021    Family History  Problem Relation Age of Onset   Diabetes Mother    Heart disease Mother    Hyperlipidemia Mother    Colon polyps Mother    Tuberculosis Father    Alcohol abuse Father    Heart disease Sister    Hyperlipidemia Sister    Heart disease Brother    Hyperlipidemia Brother    Diabetes Maternal Grandmother    Diabetes Maternal Grandfather    Colon cancer Maternal Uncle    Stomach cancer Maternal Uncle     Social History   Socioeconomic History   Marital status: Divorced    Spouse name: Not on file   Number of children: 2   Years of education: 12+2   Highest education level: Associate degree: occupational, Scientist, product/process development, or vocational program  Occupational History    Comment: Disabled  Tobacco Use   Smoking  status: Every Day    Current packs/day: 1.00    Average packs/day: 1 pack/day for 34.0 years (34.0 ttl pk-yrs)    Types: Cigarettes    Passive exposure: Current   Smokeless tobacco: Never   Tobacco comments:    0.5-1ppd as of 07/03/23 [place]  Vaping Use   Vaping status: Never Used  Substance and Sexual Activity   Alcohol use: Yes    Comment: OCCASIONALLY   Drug use: No   Sexual activity: Yes    Partners: Male    Birth control/protection: Surgical  Other Topics Concern   Not on file  Social History Narrative   ** Merged History Encounter **       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  Trying to get disability. Lives with mom.  Highest level of education:  High school   Social Determinants of Health   Financial Resource Strain: Low Risk  (03/21/2023)   Overall Financial Resource Strain (CARDIA)    Difficulty of Paying Living Expenses: Not very hard  Food Insecurity: Food Insecurity Present (03/21/2023)   Hunger Vital Sign    Worried About Running Out of Food in the Last Year: Never true    Ran Out of Food in the Last Year: Sometimes true  Transportation Needs: No Transportation Needs (03/21/2023)   PRAPARE - Administrator, Civil Service (Medical): No    Lack of Transportation (Non-Medical): No  Physical Activity: Unknown (03/21/2023)   Exercise Vital Sign    Days of Exercise per Week: 0 days    Minutes of Exercise per Session: Not on file  Stress: Stress Concern Present (03/21/2023)   [employer] of Occupational Health - Occupational Stress Questionnaire    Feeling of Stress : Rather much  Social Connections: Socially Isolated (03/21/2023)   Social Connection and Isolation Panel [NHANES]    Frequency of  Communication with Friends and Family: Once a week    Frequency of Social Gatherings with Friends and Family: Once a week    Attends Religious Services: Never    Database administrator or Organizations: No    Attends Engineer, structural:  Not on file    Marital Status: Divorced  Intimate Partner Violence: Not At Risk (12/30/2022)   Humiliation, Afraid, Rape, and Kick questionnaire    Fear of Current or Ex-Partner: No    Emotionally Abused: No    Physically Abused: No    Sexually Abused: No    Review of Systems:    Constitutional: No weight loss, fever, chills, weakness or fatigue HEENT: Eyes: No change in vision               Ears, Nose, Throat:  No change in hearing or congestion Skin: No rash or itching Cardiovascular: No chest pain, chest pressure or palpitations   Respiratory: No SOB or cough Gastrointestinal: See HPI and otherwise negative Genitourinary: No dysuria or change in urinary frequency Neurological: No headache, dizziness or syncope Musculoskeletal: No new muscle or joint pain Hematologic: No bleeding or bruising Psychiatric: No history of depression or anxiety    Physical Exam:  Vital signs: There were no vitals taken for this visit.  Constitutional: NAD, Well developed, Well nourished, alert and cooperative Head:  Normocephalic and atraumatic. Eyes:   PEERL, EOMI. No icterus. Conjunctiva pink. Respiratory: Respirations even and unlabored. Lungs clear to auscultation bilaterally.   No wheezes, crackles, or rhonchi.  Cardiovascular:  Regular rate and rhythm. No peripheral edema, cyanosis or pallor.  Gastrointestinal:  Soft, nondistended, nontender. No rebound or guarding. Normal bowel sounds. No appreciable masses or hepatomegaly. Rectal:  Not performed.  Msk:  Symmetrical without gross deformities. Without edema, no deformity or joint abnormality.  Neurologic:  Alert and  oriented x4;  grossly normal neurologically.  Skin:   Dry and intact without significant lesions or rashes. Psychiatric: Oriented to person, place and time. Demonstrates good judgement and reason without abnormal affect or behaviors.   RELEVANT LABS AND IMAGING: CBC    Component Value Date/Time   WBC 10.0 12/18/2022 1332    WBC 7.5 02/25/2021 1013   RBC 4.36 12/18/2022 1332   RBC 4.33 02/25/2021 1013   HGB 12.6 12/18/2022 1332   HCT 39.9 12/18/2022 1332   PLT 372 12/18/2022 1332   MCV 92 12/18/2022 1332   MCH 28.9 12/18/2022 1332   MCH 30.5 01/29/2021 2230   MCHC 31.6 12/18/2022 1332   MCHC 33.2 02/25/2021 1013   RDW 14.6 12/18/2022 1332   LYMPHSABS 1.8 02/25/2021 1013   LYMPHSABS 2.2 12/10/2020 1114   MONOABS 0.4 02/25/2021 1013   EOSABS 0.7 02/25/2021 1013   EOSABS 0.2 12/10/2020 1114   BASOSABS 0.1 02/25/2021 1013   BASOSABS 0.1 12/10/2020 1114    CMP     Component Value Date/Time   NA 142 02/28/2022 0831   K 4.2 02/28/2022 0831   CL 99 02/28/2022 0831   CO2 28 02/28/2022 0831   GLUCOSE 89 02/28/2022 0831   GLUCOSE 145 (H) 01/29/2021 2230   BUN 12 02/28/2022 0831   CREATININE 0.81 02/28/2022 0831   CREATININE 0.66 10/11/2011 1137   CALCIUM 10.1 02/28/2022 0831   PROT 6.9 12/10/2020 1114   ALBUMIN 4.4 12/10/2020 1114   AST 16 12/10/2020 1114   ALT 22 02/28/2022 0831   ALKPHOS 105 12/10/2020 1114   BILITOT 0.3 12/10/2020 1114   GFRNONAA >60  01/29/2021 2230   GFRAA 106 12/10/2020 1114     Assessment/Plan:   Iron deficiency Labs in February do not show iron deficiency with normal ferritin and normal hemoglobin.  Suspect if she were to have an iron deficiency it is likely from her eating disorder which results in nausea/vomiting versus GI blood loss.  No overt bleeding and cannot tolerate oral iron. Patient is concerned and would like to r/o GI blood loss. -CBC, CMP, iron studies - EGD to evaluate for esophagitis, gastritis, PUD with history of nausea/vomiting secondary to eating disorder and GERD. - I thoroughly discussed the procedure with the patient (at bedside) to include nature of the procedure, alternatives, benefits, and risks (including but not limited to bleeding, infection, perforation, anesthesia/cardiac pulmonary complications).  Patient verbalized understanding and gave  verbal consent to proceed with procedure.   Nausea and vomiting, unspecified vomiting type Secondary to eating disorder, well-controlled on daily nausea medication.  This could result in her having esophagitis/gastritis leading to possible iron deficiency. - EGD for evaluation - Continue nausea medication  Gastroesophageal reflux disease, unspecified whether esophagitis present Rare, only with trigger foods. - Educated patient on lifestyle modifications - EGD for further evaluation  Chronic constipation Relief with MiraLAX 2 capfuls daily.  Unable to afford long-term.  Would like to try a pill medication. - Trial of Linzess 145 mcg once daily - Increase fiber, increase water, increase exercise  [facility] 07/11/2023, 8:44 AM  Cc: [name], NP

## 2023-07-11 NOTE — Patient Instructions (Addendum)
_______________________________________________________  If your blood pressure at your visit was 140/90 or greater, please contact your primary care physician to follow up on this.  If you are age 57 or younger, your body mass index should be between 19-25. Your Body mass index is 20.49 kg/m. If this is out of the aformentioned range listed, please consider follow up with your Primary Care Provider.  ________________________________________________________  The [facility] providers would like to encourage you to use [facility] to communicate with providers for non-urgent requests or questions.  Due to long hold times on the telephone, sending your provider a message by [facility] may be a faster and more efficient way to get a response.  Please allow 48 business hours for a response.  Please remember that this is for non-urgent requests.  _______________________________________________________  Your provider has requested that you go to the basement level for lab work before leaving today. Press "B" on the elevator. The lab is located at the first door on the left as you exit the elevator.  We have sent the following medications to your pharmacy for you to pick up at your convenience:  START: linzess one capsule 30 minutes prior to breakfast meal each day.  Linzess works best when taken once a day every day, on an empty stomach, at least 30 minutes before your first meal of the day.  When Linzess is taken daily as directed:  *Constipation relief is typically felt in about a week *IBS-C patients may begin to experience relief from belly pain and overall abdominal symptoms (pain, discomfort, and bloating) in about 1 week,   with symptoms typically improving over 12 weeks.  Diarrhea may occur in the first 2 weeks -keep taking it.  The diarrhea should go away and you should start having normal, complete, full bowel movements. It may be helpful to start treatment when you can be near the  comfort of your own bathroom, such as a weekend.    You have been scheduled for an endoscopy. Please follow written instructions given to you at your visit today.  If you use inhalers (even only as needed), please bring them with you on the day of your procedure.  If you take any of the following medications, they will need to be adjusted prior to your procedure:   DO NOT TAKE 7 DAYS PRIOR TO TEST- Trulicity (dulaglutide) Ozempic, Wegovy (semaglutide) Mounjaro (tirzepatide) Bydureon Bcise (exanatide extended release)  DO NOT TAKE 1 DAY PRIOR TO YOUR TEST Rybelsus (semaglutide) Adlyxin (lixisenatide) Victoza (liraglutide) Byetta (exanatide) ___________________________________________________________________________   Due to recent changes in healthcare laws, you may see the results of your imaging and laboratory studies on MyChart before your provider has had a chance to review them.  We understand that in some cases there may be results that are confusing or concerning to you. Not all laboratory results come back in the same time frame and the provider may be waiting for multiple results in order to interpret others.  Please give [place] 48 hours in order for your provider to thoroughly review all the results before contacting the office for clarification of your results.   Thank you for entrusting me with your care and choosing [facility].  [place], PA-C

## 2023-07-12 ENCOUNTER — Telehealth: Payer: Self-pay | Admitting: Family

## 2023-07-12 ENCOUNTER — Encounter: Payer: Self-pay | Admitting: Family

## 2023-07-12 NOTE — Telephone Encounter (Signed)
Pt is calling in because her GI doctor wants [name] to look at her test results and see if she would like pt to take an antibiotic based on the results. Pt says she has already taken 10 days worth of steroids. Please follow up with pt.

## 2023-07-13 NOTE — Telephone Encounter (Signed)
I have called respiratory and left message for them to call patient and schedule

## 2023-07-14 ENCOUNTER — Other Ambulatory Visit: Payer: Self-pay | Admitting: Family

## 2023-07-14 DIAGNOSIS — G4700 Insomnia, unspecified: Secondary | ICD-10-CM

## 2023-07-18 DIAGNOSIS — M25551 Pain in right hip: Secondary | ICD-10-CM | POA: Diagnosis not present

## 2023-07-18 MED ORDER — TRAZODONE HCL 50 MG PO TABS
25.0000 mg | ORAL_TABLET | Freq: Every day | ORAL | 0 refills | Status: AC
Start: 2023-07-18 — End: ?

## 2023-07-20 ENCOUNTER — Telehealth: Payer: Self-pay

## 2023-07-20 NOTE — Telephone Encounter (Signed)
Tried to call patient. No answer. Left voicemail. Patient dropped off disc of MRI. She needs to schedule a follow up for the results, AND also bring a copy of the report with her. Ask patient to call us back to schedule.

## 2023-07-24 ENCOUNTER — Ambulatory Visit (HOSPITAL_BASED_OUTPATIENT_CLINIC_OR_DEPARTMENT_OTHER)
Admission: RE | Admit: 2023-07-24 | Discharge: 2023-07-24 | Disposition: A | Discharge status: Home / Self Care | Payer: Medicaid Other | Source: Ambulatory Visit | Attending: Internal Medicine | Admitting: Internal Medicine

## 2023-07-24 DIAGNOSIS — J849 Interstitial pulmonary disease, unspecified: Secondary | ICD-10-CM | POA: Diagnosis not present

## 2023-07-24 DIAGNOSIS — R918 Other nonspecific abnormal finding of lung field: Secondary | ICD-10-CM | POA: Diagnosis not present

## 2023-07-24 DIAGNOSIS — J432 Centrilobular emphysema: Secondary | ICD-10-CM | POA: Diagnosis not present

## 2023-07-25 NOTE — Telephone Encounter (Signed)
Please refer to my note from 07/25/2023 at 1:15 pm.

## 2023-07-25 NOTE — Telephone Encounter (Signed)
Report to Urgent Care/Emergency Department/call 911 for immediate medical evaluation. Schedule follow-up appointment with Primary Care at discharge.

## 2023-07-26 ENCOUNTER — Other Ambulatory Visit: Payer: Self-pay

## 2023-07-26 ENCOUNTER — Telehealth: Payer: Self-pay | Admitting: Family

## 2023-07-26 DIAGNOSIS — F419 Anxiety disorder, unspecified: Secondary | ICD-10-CM

## 2023-07-26 MED ORDER — BUSPIRONE HCL 15 MG PO TABS: 15.0000 mg | ORAL_TABLET | Freq: Two times a day (BID) | ORAL | 2 refills | Status: DC | PRN

## 2023-07-26 NOTE — Telephone Encounter (Signed)
Called pt and left vm to call office back to schedule appt requested via MyChart for lung cancer screening

## 2023-07-27 ENCOUNTER — Ambulatory Visit (HOSPITAL_COMMUNITY): Payer: Medicaid Other | Admitting: Psychiatry

## 2023-07-27 ENCOUNTER — Encounter (HOSPITAL_COMMUNITY): Payer: Self-pay | Admitting: Psychiatry

## 2023-07-27 DIAGNOSIS — G4700 Insomnia, unspecified: Secondary | ICD-10-CM

## 2023-07-27 DIAGNOSIS — F331 Major depressive disorder, recurrent, moderate: Secondary | ICD-10-CM | POA: Diagnosis not present

## 2023-07-27 DIAGNOSIS — F4321 Adjustment disorder with depressed mood: Secondary | ICD-10-CM

## 2023-07-27 DIAGNOSIS — F411 Generalized anxiety disorder: Secondary | ICD-10-CM | POA: Diagnosis not present

## 2023-07-27 MED ORDER — TRAZODONE HCL 50 MG PO TABS: 25.0000 mg | ORAL_TABLET | Freq: Every evening | ORAL | 1 refills | Status: DC | PRN

## 2023-07-27 MED ORDER — MIRTAZAPINE 15 MG PO TABS: ORAL_TABLET | ORAL | 1 refills | Status: DC
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
## 2023-07-27 NOTE — Patient Instructions (Signed)
Thank you for attending your appointment today.  -- START Remeron 7.5 mg nightly for 1 week then INCREASE to 15 mg nightly -- You may use trazodone 25-50 mg nightly as needed for sleep -- Continue other medications as prescribed.  Please do not make any changes to medications without first discussing with your provider. If you are experiencing a psychiatric emergency, please call 911 or present to your nearest emergency department. Additional crisis, medication management, and therapy resources are included below.  [facility]  [address] [place], [place] [address] [phone] WALK-IN URGENT CARE 24/7 FOR ANYONE [address], [place], [place]  [phone] Fax: [phone] [email or web address] *Interpreters available *Accepts all insurance and uninsured for Urgent Care needs *Accepts Medicaid and uninsured for outpatient treatment (below)      ONLY FOR [facility]  Below:    Outpatient New Patient Assessment/Therapy Walk-ins:        Monday -Thursday 8am until slots are full.        Every Friday 1pm-4pm  (first come, first served)                   New Patient Psychiatry/Medication Management        Monday-Friday 8am-11am (first come, first served)               For all walk-ins we ask that you arrive by 7:15am, because patients will be seen in the order of arrival.

## 2023-07-28 ENCOUNTER — Other Ambulatory Visit: Payer: Self-pay | Admitting: Cardiovascular Disease

## 2023-07-28 ENCOUNTER — Other Ambulatory Visit: Payer: Self-pay | Admitting: Family

## 2023-07-28 DIAGNOSIS — R202 Paresthesia of skin: Secondary | ICD-10-CM

## 2023-07-28 DIAGNOSIS — E782 Mixed hyperlipidemia: Secondary | ICD-10-CM

## 2023-07-28 DIAGNOSIS — J449 Chronic obstructive pulmonary disease, unspecified: Secondary | ICD-10-CM

## 2023-07-28 DIAGNOSIS — I341 Nonrheumatic mitral (valve) prolapse: Secondary | ICD-10-CM

## 2023-07-30 NOTE — Telephone Encounter (Signed)
Requested Prescriptions  Pending Prescriptions Disp Refills   gabapentin (NEURONTIN) 300 MG capsule [Pharmacy Med Name: GABAPENTIN 300 MG CAPSULE] 60 capsule 2    Sig: TAKE 1 CAPSULE BY MOUTH TWICE A DAY     Neurology: Anticonvulsants - gabapentin Passed - 07/28/2023  7:45 AM      Passed - Cr in normal range and within 360 days    Creat  Date Value Ref Range Status  10/11/2011 0.66 0.50 - 1.10 mg/dL Final   Creatinine, Ser  Date Value Ref Range Status  07/11/2023 0.77 0.40 - 1.20 mg/dL Final         Passed - Completed PHQ-2 or PHQ-9 in the last 360 days      Passed - Valid encounter within last 12 months    Recent Outpatient Visits           1 month ago Acute conjunctivitis of right eye, unspecified acute conjunctivitis type   [place] [facility], MD   2 months ago Anxiety and depression   [place] [facility], Amy J, NP   4 months ago COPD (chronic obstructive pulmonary disease) with acute bronchitis (HCC)   [place] [facility], MD   7 months ago History of anemia   [place] [facility], [place], [place]   11 months ago Encounter to establish care   [facility], [place], NP       Future Appointments             In 2 days Tarry Kos, MD [facility]   In 1 month Perlie Gold, PA-C [place] [facility], [facility]             Vitamin D, Ergocalciferol, (DRISDOL) 1.25 MG (50000 UNIT) CAPS capsule [Pharmacy Med Name: VITAMIN D2 1.25MG (50,000 UNIT)] 12 capsule     Sig: TAKE 1 CAPSULE (50,000 UNITS TOTAL) BY MOUTH EVERY 7 (SEVEN) DAYS FOR 12 DOSES.     Endocrinology:  Vitamins - Vitamin D Supplementation 2 Failed - 07/28/2023  7:45 AM      Failed - Manual Review: Route requests for 50,000 IU strength to the provider      Failed - Vitamin D in normal range and  within 360 days    Vit D, 25-Hydroxy  Date Value Ref Range Status  08/18/2022 21.4 (L) 30.0 - 100.0 ng/mL Final    Comment:    Vitamin D deficiency has been defined by the Institute of Medicine and an Endocrine Society practice guideline as a level of serum 25-OH vitamin D less than 20 ng/mL (1,2). The Endocrine Society went on to further define vitamin D insufficiency as a level between 21 and 29 ng/mL (2). 1. IOM (Institute of Medicine). 2010. Dietary reference    intakes for calcium and D. [place]: The    [employer]. 2. Holick MF, Binkley [place], Bischoff-Ferrari HA, et al.    Evaluation, treatment, and prevention of vitamin D    deficiency: an Endocrine Society clinical practice    guideline. JCEM. 2011 Jul; 96(7):1911-30.          Passed - Ca in normal range and within 360 days    Calcium  Date Value Ref Range Status  07/11/2023 10.2 8.4 - 10.5 mg/dL Final   Calcium, Ion  Date Value Ref Range Status  02/13/2011 1.14 1.12 - 1.32 mmol/L Final         Passed - Valid encounter within last 12 months    Recent Outpatient Visits           1 month ago Acute conjunctivitis of right eye, unspecified acute conjunctivitis type   [place] [facility], MD   2 months ago Anxiety and depression   [place] [facility], Amy J, NP   4 months ago COPD (chronic obstructive pulmonary disease) with acute bronchitis (HCC)   [place] [facility] at [facility], MD   7 months ago History of anemia   [place] [facility], [place], [place]   11 months ago Encounter to establish care   [facility] at [facility], Salomon Fick, NP       Future Appointments             In 2 days Tarry Kos, MD [facility]   In 1 month Perlie Gold, PA-C [place] [facility] at [facility], [facility]

## 2023-07-30 NOTE — Telephone Encounter (Signed)
Requested medication (s) are due for refill today:no  Requested medication (s) are on the active medication list: yes  Last refill:  06/04/23 #12  Future visit scheduled:no  Notes to clinic:  med not delegated to NT to Refuse/reorder   Requested Prescriptions  Pending Prescriptions Disp Refills   Vitamin D, Ergocalciferol, (DRISDOL) 1.25 MG (50000 UNIT) CAPS capsule [Pharmacy Med Name: VITAMIN D2 1.25MG (50,000 UNIT)] 12 capsule     Sig: TAKE 1 CAPSULE (50,000 UNITS TOTAL) BY MOUTH EVERY 7 (SEVEN) DAYS FOR 12 DOSES.     Endocrinology:  Vitamins - Vitamin D Supplementation 2 Failed - 07/28/2023  7:45 AM      Failed - Manual Review: Route requests for 50,000 IU strength to the provider      Failed - Vitamin D in normal range and within 360 days    Vit D, 25-Hydroxy  Date Value Ref Range Status  08/18/2022 21.4 (L) 30.0 - 100.0 ng/mL Final    Comment:    Vitamin D deficiency has been defined by the Institute of Medicine and an Endocrine Society practice guideline as a level of serum 25-OH vitamin D less than 20 ng/mL (1,2). The Endocrine Society went on to further define vitamin D insufficiency as a level between 21 and 29 ng/mL (2). 1. IOM (Institute of Medicine). 2010. Dietary reference    intakes for calcium and D. [place]: The    [employer]. 2. Holick MF, Binkley [place], Bischoff-Ferrari HA, et al.    Evaluation, treatment, and prevention of vitamin D    deficiency: an Endocrine Society clinical practice    guideline. JCEM. 2011 Jul; 96(7):1911-30.          Passed - Ca in normal range and within 360 days    Calcium  Date Value Ref Range Status  07/11/2023 10.2 8.4 - 10.5 mg/dL Final   Calcium, Ion  Date Value Ref Range Status  02/13/2011 1.14 1.12 - 1.32 mmol/L Final         Passed - Valid encounter within last 12 months    Recent Outpatient Visits           1 month ago Acute conjunctivitis of right eye, unspecified acute conjunctivitis type   [facility], MD   2 months ago Anxiety and depression   [place] [facility], Amy J, NP   4 months ago COPD (chronic obstructive pulmonary disease) with acute bronchitis (HCC)   [place] [facility], MD   7 months ago History of anemia   [place] [facility], [place], [place]   11 months ago Encounter to establish care   [facility], [place], [place]       Future Appointments             In 2 days Tarry Kos, MD [facility]   In 1 month Perlie Gold, PA-C [place] [facility]            Signed Prescriptions Disp Refills   gabapentin (NEURONTIN) 300 MG capsule 60 capsule 2    Sig: TAKE 1 CAPSULE BY MOUTH TWICE A DAY     Neurology: Anticonvulsants - gabapentin Passed - 07/28/2023  7:45 AM      Passed - Cr in normal range and within 360 days    Creat  Date Value Ref Range Status  10/11/2011 0.66 0.50 - 1.10 mg/dL Final   Creatinine, Ser  Date Value Ref Range Status  07/11/2023 0.77 0.40 - 1.20 mg/dL Final         Passed - Completed PHQ-2 or PHQ-9 in the last 360 days      Passed - Valid encounter within last 12 months    Recent Outpatient Visits           1 month ago Acute conjunctivitis of right eye, unspecified acute conjunctivitis type   [place] [facility], MD   2 months ago Anxiety and depression   [place] [facility], Amy J, NP   4 months ago COPD (chronic obstructive pulmonary disease) with acute bronchitis (HCC)   [place] [facility], MD   7 months ago History of anemia   [place] [facility], [place], NP   11 months ago Encounter to establish care   [facility] at [facility],  Sal[place] Fick, NP       Future Appointments             In 2 days Tarry Kos, MD [facility]   In 1 month Perlie Gold, PA-C [place] [facility] at [facility]

## 2023-08-01 ENCOUNTER — Encounter: Payer: Self-pay | Admitting: Orthopaedic Surgery

## 2023-08-01 ENCOUNTER — Other Ambulatory Visit: Payer: Self-pay

## 2023-08-01 ENCOUNTER — Ambulatory Visit: Payer: Medicaid Other | Admitting: Orthopaedic Surgery

## 2023-08-01 DIAGNOSIS — M25551 Pain in right hip: Secondary | ICD-10-CM | POA: Diagnosis not present

## 2023-08-01 DIAGNOSIS — R051 Acute cough: Secondary | ICD-10-CM

## 2023-08-01 DIAGNOSIS — R32 Unspecified urinary incontinence: Secondary | ICD-10-CM | POA: Diagnosis not present

## 2023-08-01 DIAGNOSIS — J449 Chronic obstructive pulmonary disease, unspecified: Secondary | ICD-10-CM | POA: Diagnosis not present

## 2023-08-01 NOTE — Progress Notes (Signed)
Office Visit Note   Patient: [name]           Date of Birth: December 28, 1965           MRN: [id number] Visit Date: 08/01/2023              Requested by: [name], NP [address] [place],  [place] [address] PCP: [name], NP   Assessment & Plan: Visit Diagnoses:  1. Pain in right hip     Plan: [name]'s MR arthrogram of the right hip is normal.  She reports pain that radiates from the back down into the groin and inner thigh.  We will obtain an MRI of the lumbar spine at this point.  We will have her follow-up with [name] after the MRI of the spine to discuss treatment options.    Follow-Up Instructions: No follow-ups on file.   Orders:  No orders of the defined types were placed in this encounter.  No orders of the defined types were placed in this encounter.     Procedures: No procedures performed   Clinical Data: No additional findings.   Subjective: Chief Complaint  Patient presents with   Right Hip - Follow-up    MRI review    HPI Patient returns today to discuss recent MR arthrogram of the right hip. Review of Systems  Constitutional: Negative.   HENT: Negative.    Eyes: Negative.   Respiratory: Negative.    Cardiovascular: Negative.   Endocrine: Negative.   Musculoskeletal: Negative.   Neurological: Negative.   Hematological: Negative.   Psychiatric/Behavioral: Negative.    All other systems reviewed and are negative.    Objective: Vital Signs: There were no vitals taken for this visit.  Physical Exam Vitals and nursing note reviewed.  Constitutional:      Appearance: She is well-developed.  HENT:     Head: Normocephalic and atraumatic.  Pulmonary:     Effort: Pulmonary effort is normal.  Abdominal:     Palpations: Abdomen is soft.  Musculoskeletal:     Cervical back: Neck supple.  Skin:    General: Skin is warm.     Capillary Refill: Capillary refill takes less than 2 seconds.  Neurological:      Mental Status: She is alert and oriented to person, place, and time.  Psychiatric:        Behavior: Behavior normal.        Thought Content: Thought content normal.        Judgment: Judgment normal.     Ortho Exam Exam of the hip is unchanged.  She reports pain radiating from the lumbar spine around the hip and groin region into the inner thigh. Specialty Comments:  No specialty comments available.  Imaging: No results found.   PMFS History: Patient Active Problem List   Diagnosis Date Noted   Iron deficiency 12/30/2022   Vitamin D deficiency 08/19/2022   Urinary incontinence 04/09/2022   ILD (interstitial lung disease) (HCC) 06/27/2021   Unstable angina (HCC) 04/13/2021   Atypical nevi 03/17/2021   Atherosclerosis 03/08/2021   Grief reaction 03/02/2021   Acute diarrhea 12/29/2020   Acute right lumbar radiculopathy 12/11/2020   Encounter for smoking cessation counseling 12/11/2020   Acute lumbar back pain 07/25/2020   COPD (chronic obstructive pulmonary disease) (HCC) 06/01/2020   GAD (generalized anxiety disorder) 12/26/2019   Poor social situation 08/29/2019   Abnormal chest CT 08/29/2019   Poor dentition 06/25/2019   Lung  nodule, solitary 06/25/2019   Chronic cough 11/26/2018   Recent unintentional weight loss over several months 11/05/2018   Current smoker 11/05/2018   Paresthesia 07/22/2018   Skull deformity 07/22/2018   Hyperlipidemia 07/22/2018   MDD (major depressive disorder) 01/09/2012   Migraine with aura 05/09/2007   Mitral valve prolapse 05/09/2007   Past Medical History:  Diagnosis Date   Allergy    Anemia    during pregnancies   Anxiety    Arthritis    hands and hip   Cancer (HCC)    CAP (community acquired pneumonia) 01/07/2020   Chronic kidney disease    KIDNEYSTONES   Cigarette smoker 11/05/2018   COPD (chronic obstructive pulmonary disease) (HCC)    Depression    Heart murmur    MITRAL VALVE PROLASP   High cholesterol    History of  endometriosis    Incontinence    Low back pain radiating to both legs 07/25/2020   Menopausal symptoms    Menopause 01/09/2012   Migraine    Myocardial infarction (HCC)    silent 3 years ago   Night sweats 10/05/2019   Osteoporosis    Paresthesia of foot, bilateral 03/28/2019   Productive cough 12/25/2019   Renal stone 06/01/2020   Skin cancer    Thyroid disease    Tick bite 02/10/2019    Family History  Problem Relation Age of Onset   Diabetes Mother    Heart disease Mother    Hyperlipidemia Mother    Colon polyps Mother    Tuberculosis Father    Alcohol abuse Father    Heart disease Sister    Hyperlipidemia Sister    Heart disease Brother    Hyperlipidemia Brother    Diabetes Maternal Grandmother    Diabetes Maternal Grandfather    Colon cancer Maternal Uncle    Stomach cancer Maternal Uncle     Past Surgical History:  Procedure Laterality Date   ANKLE SURGERY     RIGHT   APPENDECTOMY     AUGMENTATION MAMMAPLASTY     BREAST ENHANCEMENT SURGERY     SALINE   BUNIONECTOMY     BILATERAL FEET   LEFT HEART CATH AND CORONARY ANGIOGRAPHY N/A 04/13/2021   Procedure: LEFT HEART CATH AND CORONARY ANGIOGRAPHY;  Surgeon: Marykay Lex, MD;  Location: [facility];  Service: Cardiovascular;  Laterality: N/A;   TOOTH EXTRACTION N/A 07/11/2021   Procedure: DENTAL RESTORATION / EXTRACTIONS x 13;  Surgeon: Ocie Doyne, DMD;  Location: [facility];  Service: Oral Surgery;  Laterality: N/A;   TOTAL ABDOMINAL HYSTERECTOMY W/ BILATERAL SALPINGOOPHORECTOMY     TVT  10/30/2005   WISDOM TOOTH EXTRACTION     X 4   Social History   Occupational History    Comment: Disabled  Tobacco Use   Smoking status: Every Day    Current packs/day: 1.00    Average packs/day: 1 pack/day for 34.0 years (34.0 ttl pk-yrs)    Types: Cigarettes    Passive exposure: Current   Smokeless tobacco: Never   Tobacco comments:    0.5-1ppd as of 07/03/23 Tay  Vaping Use   Vaping  status: Never Used  Substance and Sexual Activity   Alcohol use: Yes    Comment: 1-2 times monthly   Drug use: No   Sexual activity: Yes    Partners: Male    Birth control/protection: Surgical

## 2023-08-01 NOTE — Addendum Note (Signed)
Addended by: Wendi Maya on: 08/01/2023 11:07 AM   Modules accepted: Orders

## 2023-08-03 ENCOUNTER — Telehealth (HOSPITAL_COMMUNITY): Payer: Self-pay | Admitting: *Deleted

## 2023-08-03 ENCOUNTER — Telehealth: Payer: Self-pay | Admitting: Internal Medicine

## 2023-08-03 ENCOUNTER — Other Ambulatory Visit: Payer: Self-pay | Admitting: Internal Medicine

## 2023-08-03 DIAGNOSIS — R051 Acute cough: Secondary | ICD-10-CM

## 2023-08-03 NOTE — Telephone Encounter (Signed)
See last closed encounter. PT still has had no call back. Thanks.

## 2023-08-03 NOTE — Telephone Encounter (Signed)
Patient called stating that she was prescribed Remeron and it made her vomit each time she took it. She has stopped the medication and does not feel like she needs anything. Asked to cancel her appointment with Dr. Josephina Shih as well as the therapy appointment. States "I want to try this on my own and I don't feel like I need the help." Does not wish to take any psychiatric medications.

## 2023-08-03 NOTE — Telephone Encounter (Signed)
PT needs the cough syrup that is a controlled substance due to cough on going and ribs hurting because of it. Pharm is [facility] [address]  Her number is [phone]

## 2023-08-03 NOTE — Telephone Encounter (Signed)
I have called respiratory this was in the last message and I have left them a message to contact the patient to schedule I have now sent a message to the patient letting her know this and also gave her the number [phone]

## 2023-08-08 NOTE — Telephone Encounter (Signed)
Pt requesting refill on Hydromet cough syrup Maralyn Sago gave on 07/06/23. Pharmacy [facility] [address]Mar[place]

## 2023-08-09 NOTE — Telephone Encounter (Signed)
Ok to do but will need finger print signature and I can do that if you type it and send it

## 2023-08-10 NOTE — Telephone Encounter (Signed)
Routed to provider

## 2023-08-13 ENCOUNTER — Ambulatory Visit (HOSPITAL_COMMUNITY): Payer: Medicaid Other | Admitting: Psychiatry

## 2023-08-14 ENCOUNTER — Encounter: Payer: Self-pay | Admitting: Cardiology

## 2023-08-14 ENCOUNTER — Ambulatory Visit: Payer: Medicaid Other | Admitting: Cardiology

## 2023-08-14 ENCOUNTER — Ambulatory Visit
Admission: RE | Admit: 2023-08-14 | Discharge: 2023-08-14 | Disposition: A | Discharge status: Home / Self Care | Payer: Medicaid Other | Source: Ambulatory Visit | Attending: Internal Medicine | Admitting: Internal Medicine

## 2023-08-14 VITALS — BP 100/60 | HR 67 | Ht 60.0 in | Wt 115.0 lb

## 2023-08-14 DIAGNOSIS — I7090 Unspecified atherosclerosis: Secondary | ICD-10-CM

## 2023-08-14 DIAGNOSIS — J849 Interstitial pulmonary disease, unspecified: Secondary | ICD-10-CM | POA: Diagnosis present

## 2023-08-14 DIAGNOSIS — I341 Nonrheumatic mitral (valve) prolapse: Secondary | ICD-10-CM | POA: Diagnosis not present

## 2023-08-14 DIAGNOSIS — J449 Chronic obstructive pulmonary disease, unspecified: Secondary | ICD-10-CM | POA: Diagnosis not present

## 2023-08-14 DIAGNOSIS — R0989 Other specified symptoms and signs involving the circulatory and respiratory systems: Secondary | ICD-10-CM

## 2023-08-14 DIAGNOSIS — E782 Mixed hyperlipidemia: Secondary | ICD-10-CM | POA: Diagnosis not present

## 2023-08-14 DIAGNOSIS — R9389 Abnormal findings on diagnostic imaging of other specified body structures: Secondary | ICD-10-CM | POA: Insufficient documentation

## 2023-08-14 LAB — PULMONARY FUNCTION TEST
DL/VA % pred: 72 %
DL/VA: 3.2 ml/min/mmHg/L
DLCO unc % pred: 41 %
DLCO unc: 7.49 ml/min/mmHg
FEF 25-75 Post: 1.85 L/s
FEF 25-75 Pre: 1.29 L/s
FEF2575-%Change-Post: 43 %
FEF2575-%Pred-Post: 80 %
FEF2575-%Pred-Pre: 56 %
FEV1-%Change-Post: 7 %
FEV1-%Pred-Post: 52 %
FEV1-%Pred-Pre: 48 %
FEV1-Post: 1.2 L
FEV1-Pre: 1.12 L
FEV1FVC-%Change-Post: 0 %
FEV1FVC-%Pred-Pre: 105 %
FEV6-%Change-Post: 8 %
FEV6-%Pred-Post: 51 %
FEV6-%Pred-Pre: 47 %
FEV6-Post: 1.45 L
FEV6-Pre: 1.34 L
FEV6FVC-%Pred-Post: 103 %
FEV6FVC-%Pred-Pre: 103 %
FVC-%Change-Post: 8 %
FVC-%Pred-Post: 49 %
FVC-%Pred-Pre: 45 %
FVC-Post: 1.45 L
FVC-Pre: 1.34 L
Post FEV1/FVC ratio: 82 %
Post FEV6/FVC ratio: 100 %
Pre FEV1/FVC ratio: 83 %
Pre FEV6/FVC Ratio: 100 %
RV % pred: 84 %
RV: 1.45 L
TLC % pred: 65 %
TLC: 2.92 L

## 2023-08-14 MED ORDER — EZETIMIBE 10 MG PO TABS: 10.0000 mg | ORAL_TABLET | Freq: Every day | ORAL | 3 refills | Status: DC

## 2023-08-14 MED ORDER — NITROGLYCERIN 0.4 MG SL SUBL: 0.4000 mg | SUBLINGUAL_TABLET | SUBLINGUAL | 1 refills | Status: AC | PRN

## 2023-08-14 MED ORDER — ALBUTEROL SULFATE (2.5 MG/3ML) 0.083% IN NEBU
2.5000 mg | INHALATION_SOLUTION | Freq: Once | RESPIRATORY_TRACT | Status: AC
Start: 2023-08-14 — End: 2023-08-14
  Administered 2023-08-14: 2.5 mg via RESPIRATORY_TRACT

## 2023-08-14 MED ORDER — NEBIVOLOL HCL 2.5 MG PO TABS: 2.5000 mg | ORAL_TABLET | Freq: Every day | ORAL | 3 refills | Status: DC

## 2023-08-14 MED ORDER — RANOLAZINE ER 500 MG PO TB12: 500.0000 mg | ORAL_TABLET | Freq: Two times a day (BID) | ORAL | 3 refills | Status: DC

## 2023-08-14 NOTE — Progress Notes (Signed)
Cardiology Office Note:   Date:  08/14/2023  ID:  [name], DOB Sep 05, 1966, MRN [id number] PCP: [name], NP  [facility] Providers Cardiologist:  Kristeen Miss, MD    History of Present Illness:   Discussed the use of AI scribe software for clinical note transcription with the patient, who gave verbal consent to proceed.  History of Present Illness   The patient, a 57 year old with a history of mitral valve prolapse, COPD, hyperlipidemia, anxiety, and cigarette use, was found to have coronary atherosclerosis by CT scan in 2022. She also reported occasional chest pain radiating up into her jaw. In June 2022, she reported ongoing intermittent chest discomfort and underwent a left heart catheterization, which showed 20% proximal LAD. At her last follow-up in May 2023, she reported still having intermittent sharp chest discomfort, and bisoprolol 2.5 mg daily was initiated.  Since her last visit with cardiology, the patient reports significant caregiver burden caring for her terminally ill mother, which had led to neglect of her own health. Last week, she reported experiencing chest tightness, jaw pain, and shoulder and arm discomfort while at rest. She took aspirin, which alleviated the symptoms within an hour. A recent high res lung CT scan revealed coronary calcifications in multiple coronary arteries and pulmonology encouraged cardiology follow up. It wasn't until she had chest pain that she scheduled the appt. Per patient, chest tightness has not recurred since. She reported stable shortness of breath due to COPD. Patient says that she does not get regular exercise and spends most of her day sedentary. She is most active with her grandchild when he visits. No chest pain with his last visit (this was after above noted chest pain). Aside from chest discomfort, she also reported frequent dizziness due to low blood pressure.  The patient has been on Crestor 40 mg for several years  due to a family history of high cholesterol. Despite this, her LDL cholesterol level was 956 in May 2023. She reports a diet mainly consisting of vegetables and soups. Patient continues to smoke about a pack of cigarettes a day.     Studies Reviewed:    EKG:   EKG Interpretation Date/Time:  Tuesday August 14 2023 09:41:13 EDT Ventricular Rate:  67 PR Interval:  136 QRS Duration:  92 QT Interval:  350 QTC Calculation: 369 R Axis:   79  Text Interpretation: Normal sinus rhythm with non specific inferior lead T wave changes When compared with ECG of 29-Jan-2021 22:20, PREVIOUS ECG IS PRESENT Reconfirmed by Perlie Gold [phone]) on 08/14/2023 1:24:27 PM   07/24/23 CT chest  IMPRESSION: 1. Progressive interstitial lung disease once again categorized as probable usual interstitial pneumonia (UIP) per current ATS guidelines. 2. Mild air trapping indicative of mild small airways disease. 3. Mild centrilobular and paraseptal emphysema also noted. 4. Aortic atherosclerosis, in addition to left main and three-vessel coronary artery disease. Please note that although the presence of coronary artery calcium documents the presence of coronary artery disease, the severity of this disease and any potential stenosis cannot be assessed on this non-gated CT examination. Assessment for potential risk factor modification, dietary therapy or pharmacologic therapy may be warranted, if clinically indicated.    Risk Assessment/Calculations:              Physical Exam:   VS:  BP 100/60 (BP Location: Right Arm, Patient Position: Sitting, Cuff Size: Normal)   Pulse 67   Ht 5' (1.524 m)   Wt 115 lb (52.2  kg)   SpO2 93%   BMI 22.46 kg/m    Wt Readings from Last 3 Encounters:  08/14/23 115 lb (52.2 kg)  07/11/23 112 lb (50.8 kg)  07/03/23 107 lb 12.8 oz (48.9 kg)     Physical Exam Vitals reviewed.  Constitutional:      Appearance: Normal appearance.  HENT:     Head: Normocephalic.      Nose: Nose normal.  Eyes:     Pupils: Pupils are equal, round, and reactive to light.  Cardiovascular:     Rate and Rhythm: Normal rate and regular rhythm.     Pulses: Normal pulses.     Heart sounds: Murmur (heard faintly over apex) heard.     No friction rub. No gallop.     Comments: Left carotid bruit noted. Pulmonary:     Effort: Pulmonary effort is normal.     Comments: Diffuse inspiratory rhonchi Abdominal:     General: Abdomen is flat.  Musculoskeletal:     Right lower leg: No edema.     Left lower leg: No edema.  Skin:    General: Skin is warm and dry.     Capillary Refill: Capillary refill takes less than 2 seconds.  Neurological:     General: No focal deficit present.     Mental Status: She is alert and oriented to person, place, and time.  Psychiatric:        Mood and Affect: Mood normal.        Behavior: Behavior normal.        Thought Content: Thought content normal.        Judgment: Judgment normal.      ASSESSMENT AND PLAN:     Assessment and Plan    Coronary Atherosclerosis Recent episode of chest pain radiating to jaw and shoulder while at rest, resolved with aspirin. Concerning for angina. History of 20% proximal LAD stenosis on catheterization in 2022. Recent high res lung CT scan showed aortic atherosclerosis and multi-vessel coronary artery calcifications. No recurrent symptoms since the episode. -Patient discussed with and seen by DOD Dr. Jacinto Halim as well today.  -Start Bystolic 2.5mg  and Ranexa 500mg  BID for chest pain management. -Continue ASA 81mg  daily -PRN nitroglycerin for chest pain -Consider cardiac catheterization if symptoms recur despite medical management.  Hyperlipidemia Despite maximum statin therapy (Crestor 40mg  daily), LDL remains elevated (137 in May 2023). Family history of high cholesterol and heart disease. -Add Zetia 10mg  daily. -Recheck cholesterol levels in a few weeks. -May need lipid clinic for further  management/PCSK9.  Tobacco Use Current smoker, approximately 1 pack per day. -Strongly advised to reduce and quit smoking to prevent progression of coronary artery disease.  Carotid Artery Disease Left carotid bruit noted on examination, suggestive of carotid artery stenosis. -Order carotid ultrasound for further evaluation.  COPD -Management per pulmonology  Follow-up in 3 weeks to assess response to new medications and symptom control. If symptoms recur, plan for cardiac catheterization.                 Signed, Perlie Gold, PA-C

## 2023-08-14 NOTE — Patient Instructions (Signed)
Medication Instructions:   START Bystolic one (1) tablet by mouth ( 2.5 mg) daily.  START Zetia one (1) tablet by mouth ( 10 mg) daily.   START Ranexa one (1) tablet by mouth ( 500 mg ) twice daily.     *If you need a refill on your cardiac medications before your next appointment, please call your pharmacy*   Lab Work:  None ordered.   If you have labs (blood work) drawn today and your tests are completely normal, you will receive your results only by: MyChart Message (if you have MyChart) OR A paper copy in the mail If you have any lab test that is abnormal or we need to change your treatment, we will call you to review the results.   Testing/Procedures:  Your physician has requested that you have a carotid duplex. This test is an ultrasound of the carotid arteries in your neck. It looks at blood flow through these arteries that supply the brain with blood. Allow one hour for this exam. There are no restrictions or special instructions.   Follow-Up: At [facility], you and your health needs are our priority.  As part of our continuing mission to provide you with exceptional heart care, we have created designated Provider Care Teams.  These Care Teams include your primary Cardiologist (physician) and Advanced Practice Providers (APPs -  Physician Assistants and Nurse Practitioners) who all work together to provide you with the care you need, when you need it.  We recommend signing up for the patient portal called "MyChart".  Sign up information is provided on this After Visit Summary.  MyChart is used to connect with patients for Virtual Visits (Telemedicine).  Patients are able to view lab/test results, encounter notes, upcoming appointments, etc.  Non-urgent messages can be sent to your provider as well.   To learn more about what you can do with MyChart, go to [email or web address].    Your next appointment:   1 month(s)  Provider:   Perlie Gold, PA-C          Other Instructions  You have been referred to Pharmacist for Lipids.

## 2023-08-16 ENCOUNTER — Ambulatory Visit (HOSPITAL_COMMUNITY): Payer: Medicaid Other

## 2023-08-19 ENCOUNTER — Other Ambulatory Visit (HOSPITAL_COMMUNITY): Payer: Self-pay | Admitting: Psychiatry

## 2023-08-19 DIAGNOSIS — G4700 Insomnia, unspecified: Secondary | ICD-10-CM

## 2023-08-20 ENCOUNTER — Other Ambulatory Visit (HOSPITAL_COMMUNITY): Payer: Self-pay | Admitting: Psychiatry

## 2023-08-20 ENCOUNTER — Ambulatory Visit: Payer: Medicaid Other

## 2023-08-20 DIAGNOSIS — G4700 Insomnia, unspecified: Secondary | ICD-10-CM

## 2023-08-21 ENCOUNTER — Ambulatory Visit (HOSPITAL_COMMUNITY)
Admission: RE | Admit: 2023-08-21 | Discharge: 2023-08-21 | Disposition: A | Discharge status: Home / Self Care | Payer: Medicaid Other | Source: Ambulatory Visit | Attending: Cardiology | Admitting: Cardiology

## 2023-08-21 DIAGNOSIS — R0989 Other specified symptoms and signs involving the circulatory and respiratory systems: Secondary | ICD-10-CM | POA: Diagnosis not present

## 2023-08-22 ENCOUNTER — Institutional Professional Consult (permissible substitution) (INDEPENDENT_AMBULATORY_CARE_PROVIDER_SITE_OTHER): Payer: Medicaid Other

## 2023-08-23 ENCOUNTER — Other Ambulatory Visit: Payer: Self-pay | Admitting: Family

## 2023-08-23 DIAGNOSIS — R202 Paresthesia of skin: Secondary | ICD-10-CM

## 2023-08-23 NOTE — Telephone Encounter (Signed)
Pt called reporting that her current supply accidentally fell in the toilet, she no longer has any to take.   Medication Refill - Medication: gabapentin (NEURONTIN) 300 MG capsule   Has the patient contacted their pharmacy? Yes.   (Agent: If no, request that the patient contact the pharmacy for the refill. If patient does not wish to contact the pharmacy document the reason why and proceed with request.) (Agent: If yes, when and what did the pharmacy advise?)  Preferred Pharmacy (with phone number or street name):  [id number] [place], [place] - [address]  [address] [place] [address]  Phone: [phone] Fax: [phone]   Has the patient been seen for an appointment in the last year OR does the patient have an upcoming appointment? Yes.    Agent: Please be advised that RX refills may take up to 3 business days. We ask that you follow-up with your pharmacy.

## 2023-08-23 NOTE — Telephone Encounter (Signed)
Pt called reporting that her current supply accidentally fell in the toilet, she no longer has any to take.

## 2023-08-24 ENCOUNTER — Other Ambulatory Visit: Payer: Self-pay | Admitting: Family

## 2023-08-24 ENCOUNTER — Other Ambulatory Visit: Payer: Self-pay | Admitting: Primary Care

## 2023-08-24 ENCOUNTER — Other Ambulatory Visit: Payer: Self-pay | Admitting: Cardiovascular Disease

## 2023-08-24 DIAGNOSIS — J449 Chronic obstructive pulmonary disease, unspecified: Secondary | ICD-10-CM

## 2023-08-24 DIAGNOSIS — I341 Nonrheumatic mitral (valve) prolapse: Secondary | ICD-10-CM

## 2023-08-24 DIAGNOSIS — R053 Chronic cough: Secondary | ICD-10-CM

## 2023-08-24 DIAGNOSIS — E782 Mixed hyperlipidemia: Secondary | ICD-10-CM

## 2023-08-24 DIAGNOSIS — R110 Nausea: Secondary | ICD-10-CM

## 2023-08-24 NOTE — Telephone Encounter (Signed)
Requested medication (s) are due for refill today: patient requesting another refill due to her medication falling in toilet.  Requested medication (s) are on the active medication list: yes  Last refill:  07/30/23  Future visit scheduled: no  Notes to clinic:  patient requesting another refill due to her medication falling in toilet. Routing for approval     Requested Prescriptions  Pending Prescriptions Disp Refills   gabapentin (NEURONTIN) 300 MG capsule 60 capsule 2    Sig: Take 1 capsule (300 mg total) by mouth 2 (two) times daily.     Neurology: Anticonvulsants - gabapentin Passed - 08/23/2023  2:20 PM      Passed - Cr in normal range and within 360 days    Creat  Date Value Ref Range Status  10/11/2011 0.66 0.50 - 1.10 mg/dL Final   Creatinine, Ser  Date Value Ref Range Status  07/11/2023 0.77 0.40 - 1.20 mg/dL Final         Passed - Completed PHQ-2 or PHQ-9 in the last 360 days      Passed - Valid encounter within last 12 months    Recent Outpatient Visits           2 months ago Acute conjunctivitis of right eye, unspecified acute conjunctivitis type   [facility], MD   3 months ago Anxiety and depression   [facility], Amy J, NP   5 months ago COPD (chronic obstructive pulmonary disease) with acute bronchitis (HCC)   [facility], MD   8 months ago History of anemia   [facility], [place], NP   1 year ago Encounter to establish care   [facility], [place], NP       Future Appointments             In 4 days  [facility], [facility]   In 2 weeks Perlie Gold, PA-C [employer] [facility] at [facility], [address]

## 2023-08-28 ENCOUNTER — Encounter: Payer: Self-pay | Admitting: Orthopaedic Surgery

## 2023-08-28 ENCOUNTER — Other Ambulatory Visit: Payer: Self-pay | Admitting: Family

## 2023-08-28 ENCOUNTER — Ambulatory Visit: Payer: Medicaid Other | Attending: Family

## 2023-08-28 DIAGNOSIS — R110 Nausea: Secondary | ICD-10-CM

## 2023-08-28 MED ORDER — ONDANSETRON 4 MG PO TBDP: 4.0000 mg | ORAL_TABLET | Freq: Three times a day (TID) | ORAL | 2 refills | Status: DC | PRN

## 2023-08-28 NOTE — Telephone Encounter (Signed)
Please let me know if I can further assist

## 2023-08-28 NOTE — Telephone Encounter (Signed)
Please let me know if I can further assist. Thank you.

## 2023-08-28 NOTE — Telephone Encounter (Signed)
Complete

## 2023-08-28 NOTE — Telephone Encounter (Signed)
Gabapentin prescribed 07/30/2023 for 90 day supply.

## 2023-08-28 NOTE — Progress Notes (Deleted)
Patient ID: UNIQUE [name]                 DOB: November 17, 1965                    MRN: [id number]      HPI: [name] is a 57 y.o. female patient referred to lipid clinic by  Perlie Gold, PA. PMH is significant for mitral valve prolapse, COPD, hyperlipidemia, anxiety, and cigarette use, was found to have coronary atherosclerosis by CT scan in 2022. She also reported occasional chest pain radiating up into her jaw. In June 2022, she reported ongoing intermittent chest discomfort and underwent a left heart catheterization, which showed 20% proximal LAD. A recent high res lung CT scan revealed coronary calcifications in multiple coronary arteries. 2 years ago, LDL-C was 246. She is on rosuvastatin 40mg  daily. LDL-C in May 2023 was 137. Saw Evan last week for chest pain. Ezetimibe 10mg  daily was added and she was referred to lipid clinic.   Labs are old- will need labs on zetia Pcks9i Fx hx smoking    Reviewed options for lowering LDL cholesterol, including ezetimibe, PCSK-9 inhibitors, bempedoic acid and inclisiran.  Discussed mechanisms of action, dosing, side effects and potential decreases in LDL cholesterol.  Also reviewed cost information and potential options for patient assistance.   Current Medications: rosuvastatin 40mg  daily Intolerances:  Risk Factors: premature CAD, family hx, LDL-C baseline >190 LDL-C goal:  ApoB goal:   Diet:   Exercise:   Family History:   Social History:   Labs: Lipid Panel     Component Value Date/Time   CHOL 219 (H) 02/28/2022 0831   TRIG 115 02/28/2022 0831   HDL 62 02/28/2022 0831   CHOLHDL 3.5 02/28/2022 0831   CHOLHDL 5.9 10/11/2011 1137   VLDL 27 10/11/2011 1137   LDLCALC 137 (H) 02/28/2022 0831   LABVLDL 20 02/28/2022 0831    Past Medical History:  Diagnosis Date   Allergy    Anemia    during pregnancies   Anxiety    Arthritis    hands and hip   Cancer (HCC)    CAP (community acquired pneumonia) 01/07/2020   Chronic kidney  disease    KIDNEYSTONES   Cigarette smoker 11/05/2018   COPD (chronic obstructive pulmonary disease) (HCC)    Depression    Heart murmur    MITRAL VALVE PROLASP   High cholesterol    History of endometriosis    Incontinence    Low back pain radiating to both legs 07/25/2020   Menopausal symptoms    Menopause 01/09/2012   Migraine    Myocardial infarction (HCC)    silent 3 years ago   Night sweats 10/05/2019   Osteoporosis    Paresthesia of foot, bilateral 03/28/2019   Productive cough 12/25/2019   Renal stone 06/01/2020   Skin cancer    Thyroid disease    Tick bite 02/10/2019    Current Outpatient Medications on File Prior to Visit  Medication Sig Dispense Refill   albuterol (PROVENTIL) (2.5 MG/3ML) 0.083% nebulizer solution INHALE 3 ML BY NEBULIZATION EVERY 6 HOURS AS NEEDED FOR WHEEZING OR SHORTNESS OF BREATH 75 mL 5   aspirin 81 MG chewable tablet Chew 81 mg by mouth daily.     Budeson-Glycopyrrol-Formoterol (BREZTRI AEROSPHERE) 160-9-4.8 MCG/ACT AERO Inhale 2 puffs into the lungs in the morning and at bedtime.     busPIRone (BUSPAR) 15 MG tablet Take 1 tablet (15 mg total) by mouth  2 (two) times daily as needed. 60 tablet 2   ezetimibe (ZETIA) 10 MG tablet Take 1 tablet (10 mg total) by mouth daily. 90 tablet 3   gabapentin (NEURONTIN) 300 MG capsule TAKE 1 CAPSULE BY MOUTH TWICE A DAY 60 capsule 2   HYDROcodone bit-homatropine (HYDROMET) 5-1.5 MG/5ML syrup Take 5 mLs by mouth at bedtime as needed for cough. 120 mL 0   linaclotide (LINZESS) 145 MCG CAPS capsule Take 1 capsule (145 mcg total) by mouth daily before breakfast. 30 capsule 3   nebivolol (BYSTOLIC) 2.5 MG tablet Take 1 tablet (2.5 mg total) by mouth daily. 90 tablet 3   nicotine (NICODERM CQ) 21 mg/24hr patch Place 1 patch (21 mg total) onto the skin daily. 28 patch 2   nitroGLYCERIN (NITROSTAT) 0.4 MG SL tablet Place 1 tablet (0.4 mg total) under the tongue every 5 (five) minutes as needed for chest pain. 25  tablet 1   omeprazole (PRILOSEC) 20 MG capsule TAKE 1 CAPSULE BY MOUTH EVERY DAY 90 capsule 1   ondansetron (ZOFRAN-ODT) 4 MG disintegrating tablet Take 1 tablet (4 mg total) by mouth every 8 (eight) hours as needed for nausea or vomiting. 30 tablet 2   ranolazine (RANEXA) 500 MG 12 hr tablet Take 1 tablet (500 mg total) by mouth 2 (two) times daily. 180 tablet 3   RESTASIS 0.05 % ophthalmic emulsion Place 1 drop into both eyes 2 (two) times daily.     rosuvastatin (CRESTOR) 40 MG tablet TAKE 1 TABLET BY MOUTH EVERY DAY 90 tablet 0   SUMAtriptan (IMITREX) 6 MG/0.5ML SOLN injection Inject 0.5 mLs (6 mg total) into the skin every 2 (two) hours as needed for migraine or headache. May repeat in 2 hours if headache persists or recurs.  Limit to twice per week. 0.5 mL 8   traZODone (DESYREL) 50 MG tablet TAKE 1/2 TO 1 TABLETS BY MOUTH AT BEDTIME AS NEEDED FOR SLEEP. 90 tablet 1   VENTOLIN HFA 108 (90 Base) MCG/ACT inhaler INHALE 1-2 PUFFS BY MOUTH EVERY 6 HOURS AS NEEDED FOR WHEEZE OR SHORTNESS OF BREATH 18 each 2   Vitamin D, Ergocalciferol, (DRISDOL) 1.25 MG (50000 UNIT) CAPS capsule TAKE 1 CAPSULE (50,000 UNITS TOTAL) BY MOUTH EVERY 7 (SEVEN) DAYS FOR 12 DOSES. 12 capsule 0   Vitamin D, Ergocalciferol, (DRISDOL) 1.25 MG (50000 UNIT) CAPS capsule Take 1 capsule (50,000 Units total) by mouth once a week. 5 capsule 0   No current facility-administered medications on file prior to visit.    Allergies  Allergen Reactions   Dextromethorphan Other (See Comments)    keeps awake, legs constantly moving    Doxylamine Other (See Comments)    keeps awake, legs constantly moving    Pseudoeph-Doxylamine-Dm-Apap Other (See Comments)    REACTION: keeps awake, legs constantly moving  NYQUIL   Pseudoephedrine Hcl Other (See Comments)    keeps awake, legs constantly moving     Assessment/Plan:  1. Hyperlipidemia -  No problem-specific Assessment & Plan notes found for this encounter.    Thank  you,  Olene Floss, Pharm.D, BCPS, CPP [facility] [address] [place], [place] [address]  Phone: [phone]; Fax: [phone]

## 2023-09-01 ENCOUNTER — Other Ambulatory Visit: Payer: Self-pay | Admitting: Family

## 2023-09-01 DIAGNOSIS — J449 Chronic obstructive pulmonary disease, unspecified: Secondary | ICD-10-CM | POA: Diagnosis not present

## 2023-09-01 DIAGNOSIS — Z1231 Encounter for screening mammogram for malignant neoplasm of breast: Secondary | ICD-10-CM

## 2023-09-01 DIAGNOSIS — R32 Unspecified urinary incontinence: Secondary | ICD-10-CM | POA: Diagnosis not present

## 2023-09-03 ENCOUNTER — Other Ambulatory Visit: Payer: Medicaid Other

## 2023-09-04 ENCOUNTER — Ambulatory Visit: Payer: Medicaid Other | Admitting: Internal Medicine

## 2023-09-04 ENCOUNTER — Encounter: Payer: Self-pay | Admitting: Internal Medicine

## 2023-09-04 VITALS — BP 97/52 | HR 64 | Temp 97.3°F | Resp 26 | Ht 60.0 in | Wt 112.0 lb

## 2023-09-04 DIAGNOSIS — R112 Nausea with vomiting, unspecified: Secondary | ICD-10-CM | POA: Diagnosis not present

## 2023-09-04 DIAGNOSIS — F32A Depression, unspecified: Secondary | ICD-10-CM | POA: Diagnosis not present

## 2023-09-04 DIAGNOSIS — F419 Anxiety disorder, unspecified: Secondary | ICD-10-CM | POA: Diagnosis not present

## 2023-09-04 DIAGNOSIS — E611 Iron deficiency: Secondary | ICD-10-CM

## 2023-09-04 DIAGNOSIS — K219 Gastro-esophageal reflux disease without esophagitis: Secondary | ICD-10-CM | POA: Diagnosis not present

## 2023-09-04 DIAGNOSIS — J449 Chronic obstructive pulmonary disease, unspecified: Secondary | ICD-10-CM | POA: Diagnosis not present

## 2023-09-04 MED ORDER — SODIUM CHLORIDE 0.9 % IV SOLN: 500.0000 mL | Freq: Once | INTRAVENOUS | Status: DC

## 2023-09-04 NOTE — Patient Instructions (Signed)

## 2023-09-04 NOTE — Op Note (Signed)
[facility] Patient Name: [name] Procedure Date: 09/04/2023 11:08 AM MRN: [id number] Endoscopist: Wilhemina Bonito. Marina Goodell , MD, [id number] Age: 58 Referring MD:  Date of Birth: Jun 23, 1966 Gender: Female Account #: [id number] Procedure:                Upper GI endoscopy with biopsies Indications:              Iron deficiency (mild) without anemia; GERD;                            nausea/vomiting Medicines:                Monitored Anesthesia Care Procedure:                Pre-Anesthesia Assessment:                           - Prior to the procedure, a History and Physical                            was performed, and patient medications and                            allergies were reviewed. The patient's tolerance of                            previous anesthesia was also reviewed. The risks                            and benefits of the procedure and the sedation                            options and risks were discussed with the patient.                            All questions were answered, and informed consent                            was obtained. Prior Anticoagulants: The patient has                            taken no anticoagulant or antiplatelet agents. ASA                            Grade Assessment: II - A patient with mild systemic                            disease. After reviewing the risks and benefits,                            the patient was deemed in satisfactory condition to                            undergo the procedure.  After obtaining informed consent, the endoscope was                            passed under direct vision. Throughout the                            procedure, the patient's blood pressure, pulse, and                            oxygen saturations were monitored continuously. The                            GIF [id number] #[id number] was introduced through the                            mouth, and advanced to the second  part of duodenum.                            The upper GI endoscopy was accomplished without                            difficulty. The patient tolerated the procedure                            well. Scope In: Scope Out: Findings:                 The esophagus was normal.                           The stomach was normal.                           The examined duodenum was normal. Biopsies for                            histology were taken with a cold forceps for                            evaluation of celiac disease.                           The cardia and gastric fundus were normal on                            retroflexion. Complications:            No immediate complications. Estimated Blood Loss:     Estimated blood loss: none. Impression:               - Normal esophagus.                           - Normal stomach.                           - Normal examined duodenum. Biopsied. Recommendation:           -  Patient has a contact number available for                            emergencies. The signs and symptoms of potential                            delayed complications were discussed with the                            patient. Return to normal activities tomorrow.                            Written discharge instructions were provided to the                            patient.                           - Resume previous diet.                           - Continue present medications.                           - Await pathology results.                           ?" Return to the care of your PCP Wilhemina Bonito. Marina Goodell, MD 09/04/2023 11:32:18 AM This report has been signed electronically.

## 2023-09-04 NOTE — Progress Notes (Signed)
Pt's states no medical or surgical changes since previsit or office visit. 

## 2023-09-04 NOTE — Progress Notes (Signed)
Author: Hilarie Fredrickson, MD Author Type: Physician Filed: 07/11/2023  1:30 PM  Note Status: Signed Cosign: Cosign Not Required Encounter Date: 07/11/2023  Editor: Hilarie Fredrickson, MD (Physician)              1.  Low iron saturation without iron deficiency anemia.  Clinical significance uncertain. 2.  Intermittent nausea and vomiting 3.  Recent colonoscopy as noted 4.  EGD reasonable regarding #1 and #2 above 5.  Supplement with multivitamin with iron.  Iron rich foods as well      Progress Notes by Legrand Como, PA-C at 07/11/2023 9:00 AM  Author: Legrand Como, PA-C Author Type: Physician Assistant Certified Filed: 07/11/2023  9:54 AM  Note Status: Signed Cosign: Cosign Not Required Encounter Date: 07/11/2023  Editor: Jearl Klinefelter (Physician Assistant Certified)             Expand All Collapse All    Chief Complaint: Iron Deficiency Primary GI MD: Dr. Marina Goodell   HPI: 57 year old female history of COPD (not on oxygen), mitral valve prolapse, others as listed below who was referred to me by [name], NP for a complaint of iron deficiency.     Patient seen 12/2022 by heme-onc for low iron saturation (8%).  At that time she had hemoglobin 12.6, ferritin 72.  Patient is unable to tolerate oral iron.  Heme-onc noted that patient is not anemic or clinically iron deficient and did not require IV iron.  Due to normal ferritin they are not concerned for iron deficiency.  Recommended to retry low-dose oral iron or get EGD for further evaluation.   Patient presents today to obtain EGD.  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  She takes antinausea medication daily which controls her symptoms.   Denies melena/hematochezia.    Reports chronic constipation in which she has 1-2 bowel movements per week.  She takes MiraLAX 2 capfuls daily for 7 days with relief.  She will then go a week without MiraLAX and becomes constipated again  and has to take MiraLAX again.  MiraLAX is too expensive for her to take on a regular basis.  She has lower abdominal pain associated with the bowel movements.   PREVIOUS GI WORKUP    February 17, 2021: Colonoscopy revealed 2 mm polyp in ascending colon removed with cold snare. Multiple small mouth diverticula in sigmoid colon        Past Medical History:  Diagnosis Date   Allergy     Anemia      during pregnancies   Anxiety     Arthritis      hands and hip   Cancer (HCC)     CAP (community acquired pneumonia) 01/07/2020   Chronic kidney disease      KIDNEYSTONES   Cigarette smoker 11/05/2018   COPD (chronic obstructive pulmonary disease) (HCC)     Depression     Heart murmur      MITRAL VALVE PROLASP   High cholesterol     History of endometriosis     Incontinence     Low back pain radiating to both legs 07/25/2020   Menopausal symptoms     Menopause 01/09/2012   Migraine     Myocardial infarction (HCC)      silent 3 years ago   Night sweats 10/05/2019   Osteoporosis     Paresthesia of foot, bilateral 03/28/2019   Productive cough 12/25/2019   Renal stone 06/01/2020  Skin cancer     Thyroid disease     Tick bite 02/10/2019               Past Surgical History:  Procedure Laterality Date   ANKLE SURGERY        RIGHT   APPENDECTOMY       AUGMENTATION MAMMAPLASTY       BREAST ENHANCEMENT SURGERY        SALINE   BUNIONECTOMY        BILATERAL FEET   LEFT HEART CATH AND CORONARY ANGIOGRAPHY N/A 04/13/2021    Procedure: LEFT HEART CATH AND CORONARY ANGIOGRAPHY;  Surgeon: Marykay Lex, MD;  Location: [facility] INVASIVE CV LAB;  Service: Cardiovascular;  Laterality: N/A;   TOOTH EXTRACTION N/A 07/11/2021    Procedure: DENTAL RESTORATION / EXTRACTIONS x 13;  Surgeon: Ocie Doyne, DMD;  Location: [facility];  Service: Oral Surgery;  Laterality: N/A;   TOTAL ABDOMINAL HYSTERECTOMY W/ BILATERAL SALPINGOOPHORECTOMY       TVT   10/30/2005   WISDOM TOOTH  EXTRACTION        X 4                Current Outpatient Medications  Medication Sig Dispense Refill   albuterol (PROVENTIL) (2.5 MG/3ML) 0.083% nebulizer solution INHALE 3 ML BY NEBULIZATION EVERY 6 HOURS AS NEEDED FOR WHEEZING OR SHORTNESS OF BREATH 75 mL 5   aspirin 81 MG chewable tablet Chew 81 mg by mouth daily.       Budeson-Glycopyrrol-Formoterol (BREZTRI AEROSPHERE) 160-9-4.8 MCG/ACT AERO Inhale 2 puffs into the lungs in the morning and at bedtime.       busPIRone (BUSPAR) 15 MG tablet Take 1 tablet (15 mg total) by mouth 2 (two) times daily as needed. 60 tablet 2   cyclobenzaprine (FLEXERIL) 10 MG tablet Take 1 tablet (10 mg total) by mouth 3 (three) times daily as needed for muscle spasms. 30 tablet 0   Fluticasone-Umeclidin-Vilant (TRELEGY ELLIPTA) 200-62.5-25 MCG/ACT AEPB Inhale 1 puff into the lungs daily. (Patient not taking: Reported on 07/03/2023) 60 each 5   gabapentin (NEURONTIN) 300 MG capsule TAKE 1 CAPSULE BY MOUTH TWICE A DAY 60 capsule 2   HYDROcodone bit-homatropine (HYDROMET) 5-1.5 MG/5ML syrup Take 5 mLs by mouth at bedtime as needed for cough. 120 mL 0   lidocaine (LIDODERM) 5 % Place 1 patch onto the skin daily. Remove & Discard patch within 12 hours or as directed by MD 30 patch 0   meloxicam (MOBIC) 15 MG tablet TAKE 1 TABLET (15 MG TOTAL) BY MOUTH DAILY. 30 tablet 0   meloxicam (MOBIC) 15 MG tablet Take 1 tablet (15 mg total) by mouth daily. 30 tablet 0   methylPREDNISolone (MEDROL DOSEPAK) 4 MG TBPK tablet Utilize as recommended po daily 21 each 0   mirabegron ER (MYRBETRIQ) 25 MG TB24 tablet Take 1 tablet (25 mg total) by mouth daily. 90 tablet 0   nicotine (NICODERM CQ) 21 mg/24hr patch Place 1 patch (21 mg total) onto the skin daily. 28 patch 2   nitroGLYCERIN (NITROSTAT) 0.4 MG SL tablet Place 1 tablet (0.4 mg total) under the tongue every 5 (five) minutes as needed for chest pain. 25 tablet 1   omeprazole (PRILOSEC) 20 MG capsule TAKE 1 CAPSULE BY MOUTH EVERY  DAY 90 capsule 1   ondansetron (ZOFRAN-ODT) 4 MG disintegrating tablet Take 1 tablet (4 mg total) by mouth every 8 (eight) hours as needed for nausea or vomiting. 30 tablet  2   Pilocarpine HCl (VUITY) 1.25 % SOLN Place 1 drop into both eyes daily.       predniSONE (DELTASONE) 10 MG tablet Take 4 tablets (40 mg total) by mouth daily with breakfast for 2 days, THEN 3 tablets (30 mg total) daily with breakfast for 2 days, THEN 2 tablets (20 mg total) daily with breakfast for 2 days, THEN 1 tablet (10 mg total) daily with breakfast for 2 days, THEN 0.5 tablets (5 mg total) daily with breakfast for 2 days. 21 tablet 0   RESTASIS 0.05 % ophthalmic emulsion Place 1 drop into both eyes 2 (two) times daily.       rosuvastatin (CRESTOR) 40 MG tablet TAKE 1 TABLET BY MOUTH EVERY DAY 90 tablet 0   sertraline (ZOLOFT) 100 MG tablet TAKE 1 TABLET BY MOUTH EVERY DAY 30 tablet 2   sulfacetamide (BLEPH-10) 10 % ophthalmic solution Place 1 drop into the right eye 4 (four) times daily. 15 mL 0   SUMAtriptan (IMITREX) 6 MG/0.5ML SOLN injection Inject 0.5 mLs (6 mg total) into the skin every 2 (two) hours as needed for migraine or headache. May repeat in 2 hours if headache persists or recurs.  Limit to twice per week. 0.5 mL 8   traZODone (DESYREL) 50 MG tablet TAKE 1/2 TABLET DAILY BY MOUTH AT BEDTIME 45 tablet 0   VENTOLIN HFA 108 (90 Base) MCG/ACT inhaler INHALE 1-2 PUFFS BY MOUTH EVERY 6 HOURS AS NEEDED FOR WHEEZE OR SHORTNESS OF BREATH 18 each 2   Vitamin D, Ergocalciferol, (DRISDOL) 1.25 MG (50000 UNIT) CAPS capsule TAKE 1 CAPSULE (50,000 UNITS TOTAL) BY MOUTH EVERY 7 (SEVEN) DAYS FOR 12 DOSES. 12 capsule 0   Vitamin D, Ergocalciferol, (DRISDOL) 1.25 MG (50000 UNIT) CAPS capsule Take 1 capsule (50,000 Units total) by mouth once a week. 5 capsule 0      No current facility-administered medications for this visit.             Allergies as of 07/11/2023 - Review Complete 07/03/2023  Allergen Reaction Noted    Dextromethorphan Other (See Comments) 01/13/2021   Doxylamine Other (See Comments) 01/13/2021   Pseudoeph-doxylamine-dm-apap Other (See Comments)     Pseudoephedrine hcl Other (See Comments) 01/13/2021           Family History  Problem Relation Age of Onset   Diabetes Mother     Heart disease Mother     Hyperlipidemia Mother     Colon polyps Mother     Tuberculosis Father     Alcohol abuse Father     Heart disease Sister     Hyperlipidemia Sister     Heart disease Brother     Hyperlipidemia Brother     Diabetes Maternal Grandmother     Diabetes Maternal Grandfather     Colon cancer Maternal Uncle     Stomach cancer Maternal Uncle            Social History         Socioeconomic History   Marital status: Divorced      Spouse name: Not on file   Number of children: 2   Years of education: 12+2   Highest education level: Associate degree: occupational, Scientist, product/process development, or vocational program  Occupational History      Comment: Disabled  Tobacco Use   Smoking status: Every Day      Current packs/day: 1.00      Average packs/day: 1 pack/day for 34.0 years (34.0 ttl pk-yrs)  Types: Cigarettes      Passive exposure: Current   Smokeless tobacco: Never   Tobacco comments:      0.5-1ppd as of 07/03/23 Tay  Vaping Use   Vaping status: Never Used  Substance and Sexual Activity   Alcohol use: Yes      Comment: OCCASIONALLY   Drug use: No   Sexual activity: Yes      Partners: Male      Birth control/protection: Surgical  Other Topics Concern   Not on file  Social History Narrative    ** Merged History Encounter **         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  Trying to get disability. Lives with mom.  Highest level of education:  High school    Social Determinants of Health        Financial Resource Strain: Low Risk  (03/21/2023)    Overall Financial Resource Strain (CARDIA)     Difficulty of Paying Living Expenses: Not very hard  Food Insecurity: Food  Insecurity Present (03/21/2023)    Hunger Vital Sign     Worried About Running Out of Food in the Last Year: Never true     Ran Out of Food in the Last Year: Sometimes true  Transportation Needs: No Transportation Needs (03/21/2023)    PRAPARE - Therapist, art (Medical): No     Lack of Transportation (Non-Medical): No  Physical Activity: Unknown (03/21/2023)    Exercise Vital Sign     Days of Exercise per Week: 0 days     Minutes of Exercise per Session: Not on file  Stress: Stress Concern Present (03/21/2023)    [employer] of Occupational Health - Occupational Stress Questionnaire     Feeling of Stress : Rather much  Social Connections: Socially Isolated (03/21/2023)    Social Connection and Isolation Panel [NHANES]     Frequency of Communication with Friends and Family: Once a week     Frequency of Social Gatherings with Friends and Family: Once a week     Attends Religious Services: Never     Database administrator or Organizations: No     Attends Engineer, structural: Not on file     Marital Status: Divorced  Intimate Partner Violence: Not At Risk (12/30/2022)    Humiliation, Afraid, Rape, and Kick questionnaire     Fear of Current or Ex-Partner: No     Emotionally Abused: No     Physically Abused: No     Sexually Abused: No      Review of Systems:    Constitutional: No weight loss, fever, chills, weakness or fatigue HEENT: Eyes: No change in vision               Ears, Nose, Throat:  No change in hearing or congestion Skin: No rash or itching Cardiovascular: No chest pain, chest pressure or palpitations   Respiratory: No SOB or cough Gastrointestinal: See HPI and otherwise negative Genitourinary: No dysuria or change in urinary frequency Neurological: No headache, dizziness or syncope Musculoskeletal: No new muscle or joint pain Hematologic: No bleeding or bruising Psychiatric: No history of depression or anxiety      Physical  Exam:  Vital signs: There were no vitals taken for this visit.   Constitutional: NAD, Well developed, Well nourished, alert and cooperative Head:  Normocephalic and atraumatic. Eyes:   PEERL, EOMI. No icterus. Conjunctiva pink. Respiratory: Respirations even and unlabored.  Lungs clear to auscultation bilaterally.   No wheezes, crackles, or rhonchi.  Cardiovascular:  Regular rate and rhythm. No peripheral edema, cyanosis or pallor.  Gastrointestinal:  Soft, nondistended, nontender. No rebound or guarding. Normal bowel sounds. No appreciable masses or hepatomegaly. Rectal:  Not performed.  Msk:  Symmetrical without gross deformities. Without edema, no deformity or joint abnormality.  Neurologic:  Alert and  oriented x4;  grossly normal neurologically.  Skin:   Dry and intact without significant lesions or rashes. Psychiatric: Oriented to person, place and time. Demonstrates good judgement and reason without abnormal affect or behaviors.     RELEVANT LABS AND IMAGING: CBC Labs (Brief)          Component Value Date/Time    WBC 10.0 12/18/2022 1332    WBC 7.5 02/25/2021 1013    RBC 4.36 12/18/2022 1332    RBC 4.33 02/25/2021 1013    HGB 12.6 12/18/2022 1332    HCT 39.9 12/18/2022 1332    PLT 372 12/18/2022 1332    MCV 92 12/18/2022 1332    MCH 28.9 12/18/2022 1332    MCH 30.5 01/29/2021 2230    MCHC 31.6 12/18/2022 1332    MCHC 33.2 02/25/2021 1013    RDW 14.6 12/18/2022 1332    LYMPHSABS 1.8 02/25/2021 1013    LYMPHSABS 2.2 12/10/2020 1114    MONOABS 0.4 02/25/2021 1013    EOSABS 0.7 02/25/2021 1013    EOSABS 0.2 12/10/2020 1114    BASOSABS 0.1 02/25/2021 1013    BASOSABS 0.1 12/10/2020 1114        CMP     Labs (Brief)          Component Value Date/Time    NA 142 02/28/2022 0831    K 4.2 02/28/2022 0831    CL 99 02/28/2022 0831    CO2 28 02/28/2022 0831    GLUCOSE 89 02/28/2022 0831    GLUCOSE 145 (H) 01/29/2021 2230    BUN 12 02/28/2022 0831    CREATININE 0.81  02/28/2022 0831    CREATININE 0.66 10/11/2011 1137    CALCIUM 10.1 02/28/2022 0831    PROT 6.9 12/10/2020 1114    ALBUMIN 4.4 12/10/2020 1114    AST 16 12/10/2020 1114    ALT 22 02/28/2022 0831    ALKPHOS 105 12/10/2020 1114    BILITOT 0.3 12/10/2020 1114    GFRNONAA >60 01/29/2021 2230    GFRAA 106 12/10/2020 1114          Assessment/Plan:    Iron deficiency Labs in February do not show iron deficiency with normal ferritin and normal hemoglobin.  Suspect if she were to have an iron deficiency it is likely from her eating disorder which results in nausea/vomiting versus GI blood loss.  No overt bleeding and cannot tolerate oral iron. Patient is concerned and would like to r/o GI blood loss. -CBC, CMP, iron studies - EGD to evaluate for esophagitis, gastritis, PUD with history of nausea/vomiting secondary to eating disorder and GERD. - I thoroughly discussed the procedure with the patient (at bedside) to include nature of the procedure, alternatives, benefits, and risks (including but not limited to bleeding, infection, perforation, anesthesia/cardiac pulmonary complications).  Patient verbalized understanding and gave verbal consent to proceed with procedure.    Nausea and vomiting, unspecified vomiting type Secondary to eating disorder, well-controlled on daily nausea medication.  This could result in her having esophagitis/gastritis leading to possible iron deficiency. - EGD for evaluation - Continue nausea medication  Gastroesophageal reflux disease, unspecified whether esophagitis present Rare, only with trigger foods. - Educated patient on lifestyle modifications - EGD for further evaluation   Chronic constipation Relief with MiraLAX 2 capfuls daily.  Unable to afford long-term.  Would like to try a pill medication. - Trial of Linzess 145 mcg once daily - Increase fiber, increase water, increase exercise   Lara Mulch [facility] 07/11/2023, 8:44  AM

## 2023-09-04 NOTE — Progress Notes (Signed)
Called to room to assist during endoscopic procedure.  Patient ID and intended procedure confirmed with present staff. Received instructions for my participation in the procedure from the performing physician.  

## 2023-09-04 NOTE — Progress Notes (Signed)
Vss nad trans to pacu 

## 2023-09-05 ENCOUNTER — Telehealth: Payer: Self-pay

## 2023-09-05 NOTE — Telephone Encounter (Signed)
Left message on follow up call. 

## 2023-09-06 ENCOUNTER — Ambulatory Visit: Payer: Medicaid Other | Admitting: Internal Medicine

## 2023-09-06 ENCOUNTER — Encounter: Payer: Self-pay | Admitting: Internal Medicine

## 2023-09-06 VITALS — BP 110/72 | HR 71 | Ht 60.0 in | Wt 118.2 lb

## 2023-09-06 DIAGNOSIS — R590 Localized enlarged lymph nodes: Secondary | ICD-10-CM | POA: Diagnosis not present

## 2023-09-06 DIAGNOSIS — F17210 Nicotine dependence, cigarettes, uncomplicated: Secondary | ICD-10-CM | POA: Diagnosis not present

## 2023-09-06 DIAGNOSIS — F17200 Nicotine dependence, unspecified, uncomplicated: Secondary | ICD-10-CM

## 2023-09-06 DIAGNOSIS — Z148 Genetic carrier of other disease: Secondary | ICD-10-CM

## 2023-09-06 DIAGNOSIS — J849 Interstitial pulmonary disease, unspecified: Secondary | ICD-10-CM

## 2023-09-06 DIAGNOSIS — Z7729 Contact with and (suspected ) exposure to other hazardous substances: Secondary | ICD-10-CM

## 2023-09-06 DIAGNOSIS — J439 Emphysema, unspecified: Secondary | ICD-10-CM | POA: Diagnosis not present

## 2023-09-06 NOTE — Progress Notes (Signed)
OV 02/22/2021  Subjective:  Patient ID: [name], female , DOB: May 29, 1966 , age 57 y.o. , MRN: [id number] , ADDRESS: [address] [place] [address] PCP Allayne Stack, DO Patient Care Team: Allayne Stack, DO as PCP - General (Family Medicine) Glendale Chard, DO as Consulting Physician (Neurology)  This Provider for this visit: Treatment Team:  Attending Provider: Kalman Shan, MD    02/22/2021 -   Chief Complaint  Patient presents with   Consult    Hx of COPD, nodules on CT scan a year or 2 ago.  Pna on 4/2.     HPI [name] 57 y.o. -presents for pulm evaluation because of chronic cough and shortness of breath.  She tells me that she has cough and shortness of breath for few to several years.  Slowly it has been progressive.  In March 2022 she had COVID-19.  Treated as outpatient.  This did not change her baseline symptoms.  She feels symptoms are significant as documented below.  There is associated wheezing.  She is on Trelegy and this helps her.  Albuterol also helps.  She says she has been given a diagnosis of COPD based on CT scan of the chest and symptoms.  She says she has had exacerbations treated by prednisone 3 times as outpatient.  This is also helped her symptoms.  She has over 25 pack smoking history.  Probably over 30 pack.  She quit for many years and then finally started smoking again 5 years ago because of stressors in life.  She finally quit again 2 weeks ago although she says it is very hard to maintain remission.  Even in the years when she quit smoking she was still exposed to the wood smoke.  Her mom continues to smoke and she is exposed to passive smoke.  Her mom has COPD so this family history of COPD.  Unclear if this alpha-1 antitrypsin phenotype.   Of note: She has poor dentition.  She is seen Dr. Allean Found in [place] oral surgery.  Given her chronic cough and wheezing and shortness of breath they wanted pulmonary  clearance.   Exposure hx  - She smoked from age 38 to age 44 x 1 pack/day.  Then she quit till age 22 and for the last 5 years has been smoking 1 pack a day again and then quit 2 weeks ago.  Total smoking history then becomes 69 pack/day  -She owns a large property according to history.  Unclear if it is rule out commercial or residential line.  Nevertheless she does burn a lot of wood on a regular basis constantly and gets exposed to the wood smoke.  Although does not stove is wood smoke exposure there is significant  -No roaches or mildew in the house.  No mold in the house.  No bird feather in the house.  No pet birds or parakeets.  No gerbils no hamsters.  No down pillow.  No feather jackets.  There are no roaches in the house.  CT Chest data dec 2020   IMPRESSION: Peribronchial thickening with persistent bibasilar ground-glass infiltrates which could be infectious or inflammatory.   Resolution of majority of mucous plugging since previous exam.   Atherosclerotic calcifications including coronary arteries.   Resolution of previously identified 8 mm LEFT upper lobe ground-glass nodule.   Aortic Atherosclerosis (ICD10-I70.0).     Electronically Signed   By: Ulyses Southward M.D.   On:  10/15/2019 08:59   1 Patient Communication  Component 1 mo ago 3/722  SARS Coronavirus 2 RESULT: POSITIVE Abnormal         PFT  No flowsheet data found.       03/08/2021 Patient presents today for follow-up. During last visit she was ordered for labs including CBC with diff, IgE, RAST allergy panel and Alpha 1 phenotype. She was also ordered for HRCT. Recommended to continue Trelegy. Pre-op eval for Dr. Allean Found DDS at follow-up, may need cardiology eval/clearance if imaging shows any significant cardiac findings. CT Imaging showed significant worsening of chronic extensive patchy ground glass opacities throughout both lungs with associated mild patchy subpleural reticulation and mild  cylindrical bronchiectasis. Favoring desquamative interstitial pneumonia. Three vessel coronary atherosclerosis.   She feels her breathing has improve some. She still has a np congested cough, this is not new. She is unable to produce mucus. She is still smoking 10 cigarettes a day. She is interested in quitting and has in the past several years ago. Her primary care physican just put her on chantix. She has no PFTs on file. She is needing clearance for dental work, unclear type of anesthesia. She had no issues with recent colonscopy several weeks ago.    06/09/2021 - Interim hx  Patient presents today for 3 month follow-up with PFTs. Current smoker, she is still smoking 10 cigarettes a day. She is having a hard time quitting, she tells me that everyone in her life smokes around her. She was unable to tolerate chantix d/t nausea/vomiting for 1 month. She reports improvement in breathing with Trelegy. Uses SABA on average once a day. Albuterol helped her wheezing after she took it today for her breathing test. She still has a chronic np cough, intermittent wheezing and dyspnea on exertion. She was unable to take mucinex because she can not swallow large pills. HRCT imaging in May 2022 showed significant worsening of smoking related ILD changes, at her last visit we stressed importance of smoking cessation. Breathing test today showed moderate-severe restriction and severe diffusion defect. 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 FENO 23.     Pulmonary function testing 06/09/2021-FVC 1.55 (50%), FEV1 1.34 (55%), ratio 87, TLC 76%, DLCOunc 8.09 (43%)  Labs:  02/25/21 - Eos absolute 700; IgE 2. Rast allergy panel negative.  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 Significant worsening of chronic extensive patchy ground-glass opacities throughout both lungs, lower lobe predominant, with associated mild patchy subpleural reticulation and mild cylindrical bronchiectasis. 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 2. Three-vessel coronary atherosclerosis. 3. Diffuse bronchial wall thickening and mild centrilobular emphysema, compatible with the provided history of COPD. 4. Aortic Atherosclerosis (ICD10-I70.0) and Emphysema (ICD10-J43.9).   Exposure hx  - She smoked from age 34 to age 41 x 1 pack/day.  Then she quit till age 78 and for the last 5 years has been smoking 1 pack a day again and then quit 2 weeks ago.  Total smoking history then becomes 30 pack/day   -She owns a large property according to history.  Unclear if it is rule out commercial or residential line.  Nevertheless she does burn a lot of wood on a regular basis constantly and gets exposed to the wood smoke.  Although does not stove is wood smoke exposure there is significant   -No roaches or mildew in the house.  No mold in the house.  No bird feather in  the house.  No pet birds or parakeets.  No gerbils no hamsters.  No down pillow.  No feather jackets.  There are no roaches in the house.   OV 06/13/2022  Subjective:  Patient ID: [name], female , DOB: 01/31/1966 , age 25 y.o. , MRN: [id number] , ADDRESS: [address] [place] [address] PCP Cora Collum, DO Patient Care Team: Cora Collum, DO as PCP - General (Family Medicine) Nahser, Deloris Ping, MD as PCP - Cardiology (Cardiology) Glendale Chard, DO as Consulting Physician (Neurology)  This Provider for this visit: Treatment Team:  Attending Provider: Kalman Shan, MD    06/13/2022 -   Chief Complaint  Patient presents with   Follow-up    PFT performed today.  Pt states she has been having pain in sides and back when she coughs or breathes. States her breathing has been worse.   HPI DORTHIE SANTINI 57 y.o. -returns for follow-up.  Its been a year since I saw her.  At that time there is concern significantly for DIP which is interstitial lung disease on account of her  smoking.  So we also do pulmonary function test.  She did pulmonary function test today.  She has severe restrictio nd her FEV1 is significantly declined compared to a year ago..  I personally visualized PFT.  Unable to get this to flow into the epic at this point in time.  She tells me that overall she continues to have significant cough.  She also tries to bring her to phlegm and she can taste it to be of metallic taste but unable to expectorate.  She is on Trelegy but she ran out a few weeks ago because of insurance issues.  She is also complaining about pain in her scapular region from coughing a lot.  And for the last few days she has had increased cough, congestion, tightness and increased wheezing.  She feels she is in a COPD exacerbation at this point.  Her smoking has relapsed.  People around her house smoke.  She has extensive family members with COPD.  Her mother is going to see 1 of pulmonary physicians in the office in the next few weeks.  She is interested in research studies.   OV 04/25/2023  Subjective:  Patient ID: [name], female , DOB: [birth year] , age 26 y.o. , MRN: [id number] , ADDRESS: [address] [place] [address] PCP [name], NP Patient Care Team: [name], NP as PCP - General (Nurse Practitioner) Nahser, Deloris Ping, MD as PCP - Cardiology (Cardiology) Glendale Chard, DO as Consulting Physician (Neurology)  This Provider for this visit: Treatment Team:  Attending Provider: Kalman Shan, MD    04/25/2023 -  No chief complaint on file.   HPI SHANINE KREIGER 57 y.o. -   OV 07/03/2023  Subjective:  Patient ID: [name], female , DOB: 1966/09/14 , age 25 y.o. , MRN: [id number] , ADDRESS: [address] [place] [address] PCP [name], NP Patient Care Team: [name], NP as PCP - General (Nurse Practitioner) Nahser, Deloris Ping, MD as PCP - Cardiology (Cardiology) Glendale Chard, DO as Consulting Physician  (Neurology)  This Provider for this visit: Treatment Team:  Attending Provider: Kalman Shan, MD 07/03/2023 -   Chief Complaint  Patient presents with   Follow-up    F/up      HPI [name] 57 y.o. -not seen in over  a year.  In October 2023 she had a CT scan and showed worsening ILD.  The ILD features are getting more prominent.  She missed her appointment in June 2024.  This is because a mother who had small cell cancer died.  She was the main caregiver.  She says she continues to smoke.  She is not able to afford Trelegy.  Therefore she is doing albuterol as needed.  She is constantly coughing.  Cough is particularly worse at night but also in the daytime it is dry.  She says she is wheezing all the time.  She is also short of breath but the wheezing has gotten worse and the cough is gotten worse the shortness of breath around the same.  Of note for the last 1 days she is noticing submandibular cervical adenopathy.  Has not seen anybody for this.  There is no sore throat or fever or chills.    CT Chest data from date: *HRCT 08/17/22  - personally visualized and independently interpreted : yes - my findings are: as below  IMPRESSION: 1. The appearance of the lungs is indicative of interstitial lung disease, with some areas of progression compared to the prior study and other areas which appear improved. Overall, the spectrum of findings on today's examination is technically categorized as probable usual interstitial pneumonia (UIP) per current ATS guidelines. Repeat high-resolution chest CT is suggested in 12 months to assess for temporal changes in the appearance of the lung parenchyma. 2. Mild centrilobular and paraseptal emphysema also noted, compatible with reported clinical history of COPD. 3. Aortic atherosclerosis, in addition to left main and three-vessel coronary artery disease. Please note that although the presence of coronary artery calcium documents the presence  of coronary artery disease, the severity of this disease and any potential stenosis cannot be assessed on this non-gated CT examination. Assessment for potential risk factor modification, dietary therapy or pharmacologic therapy may be warranted, if clinically indicated.   Aortic Atherosclerosis (ICD10-I70.0) and Emphysema (ICD10-J43.9).     Electronically Signed   By: Trudie Reed M.D.   On: 08/18/2022 09:43    OV 09/06/2023  Subjective:  Patient ID: [name], female , DOB: 08/13/1966 , age 89 y.o. , MRN: [id number] , ADDRESS: [address] [place] [address] PCP [name], NP Patient Care Team: [name], NP as PCP - General (Nurse Practitioner) Nahser, Deloris Ping, MD as PCP - Cardiology (Cardiology) Glendale Chard, DO as Consulting Physician (Neurology)  This Provider for this visit: Treatment Team:  Attending Provider: Kalman Shan, MD    09/06/2023 -   Chief Complaint  Patient presents with   Follow-up    Pt would like to f/u on ct, labs and pft results. Has concerns with severe cough       Emphysema alpha-1 MZ on Trelegy - Family history of COPD [brother, sister, mother]  -Passive smoking in the family  -Normal exhaled nitric oxide 2022  #Active smoker  #2022 blood eosinophils 700 cells per cubic millimeter but allergy test negative  - 200 cells Sept 2024    #March 2022 acute COVID infection outpatient treatment  #Coronary artery calcification seen on CT scan of the chest 2022  -Follows Dr. Kristeen Miss May 2023   #ILD   - GGO mild 2020  -> slightly more proinet 2022 -> ILD/?NSIP intdeterminate pattern 2023 and 2024 WORSE  HPI MCKENZI BUONOMO 56 y.o. -presents for follow-up after completing some of her ILD workup.  In terms of his symptoms: These remain the same.  See below.  No new medical problems no ER visits no surgeries.  In terms of her ILD workup  -She continues to smoke  - This underwent EGD by Dr.  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  She is on antinausea medications according to chart review.  According to history the EGD was unremarkable.  I confirmed this upon record review of the her endoscopy report 09/04/2023  -  she had autoimmune serology this is noorma  -She had a high-resolution CT chest: I agree with the radiologist there is emphysema and also air trapping.  I disagree with the radiologist after my own personal visualization that this is probable UIP.  I feel it is indeterminate/alternative diagnosis.  What started as a groundglass opacities 4 years ago has progressed into full-blown ILD.  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  She is a smoker this could be smokers ILD as well.    SYMPTOM SCALE - Pulm 02/22/2021   06/09/2021  09/06/2023   O2 use ra RA ra  Shortness of Breath 0 -> 5 scale with 5 being worst (score 6 If unable to do)    At rest 2 2 2   Simple tasks - showers, clothes change, eating, shaving 2 3 2.5  Household (dishes, doing bed, laundry) 3.5 4 2.5  Shopping 4 4 3   Walking level at own pace 2 3 3   Walking up Stairs 3 4 4   Total (30-36) Dyspnea Score 18.5 20 19   How bad is your cough? 4 3 Bad ribs hurts  How bad is your fatigue 5 5 All the time  How bad is nausea 4 4 sometimes  How bad is vomiting?   2 2 no  How bad is diarrhea? 0 0 no  How bad is anxiety? 5 3 Not bad  How bad is depression 5 3 Not bad    CT Chest data from date: *  - personally visualized and independently interpreted : yes - my findings are: INDETERMINATE FOR UIP v ALTERNATIVE Dx  Narrative & Impression  CLINICAL DATA:  57 year old female current smoker with history of interstitial lung disease. Follow-up study.   EXAM: CT CHEST WITHOUT CONTRAST   TECHNIQUE: Multidetector CT imaging of the chest was performed following the standard protocol without intravenous contrast. High resolution imaging of the lungs,  as well as inspiratory and expiratory imaging, was performed.   RADIATION DOSE REDUCTION: This exam was performed according to the departmental dose-optimization program which includes automated exposure control, adjustment of the mA and/or kV according to patient size and/or use of iterative reconstruction technique.   COMPARISON:  High-resolution chest CT 08/17/2022.   FINDINGS: Cardiovascular: Heart size is normal. There is no significant pericardial fluid, thickening or pericardial calcification. There is aortic atherosclerosis, as well as atherosclerosis of the great vessels of the mediastinum and the coronary arteries, including calcified atherosclerotic plaque in the left main, left anterior descending, left circumflex and right coronary arteries.   Mediastinum/Nodes: No pathologically enlarged mediastinal or hilar lymph nodes. Please note that accurate exclusion of hilar adenopathy is limited on noncontrast CT scans. Esophagus is unremarkable in appearance. No axillary lymphadenopathy.   Lungs/Pleura: High-resolution images again demonstrate widespread areas of ground-glass attenuation, septal thickening, subpleural reticulation, cylindrical bronchiectasis, peripheral bronchiolectasis and thickening of the peribronchovascular interstitium. These findings are most evident throughout the mid to lower lungs bilaterally and appear clearly progressive compared  to the prior study. No definitive honeycombing confidently identified. Inspiratory and expiratory imaging is remarkable for mild air trapping indicative of mild small airways disease. No pleural effusions. No definite suspicious appearing pulmonary nodules or masses are noted. Mild centrilobular and paraseptal emphysema.   Upper Abdomen: Aortic atherosclerosis.   Musculoskeletal: Bilateral breast implants are incidentally noted. Chronic appearing compression fracture of inferior endplate of T10 with proximally 20% loss  of anterior vertebral body height, unchanged. There are no aggressive appearing lytic or blastic lesions noted in the visualized portions of the skeleton.   IMPRESSION: 1. Progressive interstitial lung disease once again categorized as probable usual interstitial pneumonia (UIP) per current ATS guidelines. 2. Mild air trapping indicative of mild small airways disease. 3. Mild centrilobular and paraseptal emphysema also noted. 4. Aortic atherosclerosis, in addition to left main and three-vessel coronary artery disease. Please note that although the presence of coronary artery calcium documents the presence of coronary artery disease, the severity of this disease and any potential stenosis cannot be assessed on this non-gated CT examination. Assessment for potential risk factor modification, dietary therapy or pharmacologic therapy may be warranted, if clinically indicated.   Aortic Atherosclerosis (ICD10-I70.0) and Emphysema (ICD10-J43.9).     Electronically Signed   By: Trudie Reed M.D.   On: 08/05/2023 12:08     PFT    Latest Ref Rng & Units 08/14/2023   10:59 AM 06/13/2022    9:05 AM 06/09/2021    9:05 AM  PFT Results  FVC-Pre L 1.34  1.19  1.61   FVC-Predicted Pre % 45  38  52   FVC-Post L 1.45   1.55   FVC-Predicted Post % 49   50   Pre FEV1/FVC % % 83  83  81   Post FEV1/FCV % % 82   87   FEV1-Pre L 1.12  0.98  1.30   FEV1-Predicted Pre % 48  40  53   FEV1-Post L 1.20   1.34   DLCO uncorrected ml/min/mmHg 7.49  7.87  8.09   DLCO UNC% % 41  42  43   DLCO corrected ml/min/mmHg  7.87  8.09   DLCO COR %Predicted %  42  43   DLVA Predicted % 72  72  83   TLC L 2.92   3.52   TLC % Predicted % 65   76   RV % Predicted % 84   110      Latest Reference Range & Units 07/03/23 12:38  ANA Titer 1  Negative  Cyclic Citrullin Peptide Ab UNITS <16  dsDNA Ab 0 - 9 IU/mL <1  ENA RNP Ab 0.0 - 0.9 AI <0.2  ENA SSA (RO) Ab 0.0 - 0.9 AI <0.2  ENA SSB (LA) Ab 0.0 - 0.9 AI  <0.2  RA Latex Turbid. <14 IU/mL 12  ENA SM Ab Ser-aCnc 0.0 - 0.9 AI <0.2  Scleroderma (Scl-70) (ENA) Antibody, IgG 0.0 - 0.9 AI <0.2    LAB RESULTS last 96 hours No results found.  LAB RESULTS last 90 days Recent Results (from the past 2160 hour(s))  B Nat Peptide     Status: None   Collection Time: 07/03/23 12:38 PM  Result Value Ref Range   Pro B Natriuretic peptide (BNP) 22.0 0.0 - 100.0 pg/mL  Cyclic citrul peptide antibody, IgG     Status: None   Collection Time: 07/03/23 12:38 PM  Result Value Ref Range   Cyclic Citrullin Peptide Ab <40 UNITS  Comment: Reference Range Negative:            <20 Weak Positive:       20-39 Moderate Positive:   40-59 Strong Positive:     >59 .   Rheumatoid Factor     Status: None   Collection Time: 07/03/23 12:38 PM  Result Value Ref Range   Rheumatoid fact SerPl-aCnc 12 <14 IU/mL  ANA+ENA+DNA/DS+Scl 70+SjoSSA/B     Status: None   Collection Time: 07/03/23 12:38 PM  Result Value Ref Range   ANA Titer 1 Negative     Comment:                                      Negative   <1:80                                      Borderline  1:80                                      Positive   >1:80 ICAP nomenclature: AC-0 For more information about Hep-2 cell patterns use ANApatterns.org, the official website for the International Consensus on Antinuclear Antibody (ANA) Patterns (ICAP).    dsDNA Ab <1 0 - 9 IU/mL    Comment:                                    Negative      <5                                    Equivocal  5 - 9                                    Positive      >9    ENA RNP Ab <0.2 0.0 - 0.9 AI   ENA SM Ab Ser-aCnc <0.2 0.0 - 0.9 AI   Scleroderma (Scl-70) (ENA) Antibody, IgG <0.2 0.0 - 0.9 AI   ENA SSA (RO) Ab <0.2 0.0 - 0.9 AI   ENA SSB (LA) Ab <0.2 0.0 - 0.9 AI  Sed Rate (ESR)     Status: Abnormal   Collection Time: 07/03/23 12:38 PM  Result Value Ref Range   Sed Rate 35 (H) 0 - 30 mm/hr  QuantiFERON-TB Gold Plus      Status: None   Collection Time: 07/03/23 12:38 PM  Result Value Ref Range   QuantiFERON-TB Gold Plus NEGATIVE NEGATIVE    Comment: Negative test result. M. tuberculosis complex  infection unlikely.    NIL 0.11 IU/mL   Mitogen-NIL 9.74 IU/mL   TB1-NIL <0.00 IU/mL   TB2-NIL <0.00 IU/mL    Comment: . The Nil tube value reflects the background interferon gamma immune response of the patient's blood sample. This value has been subtracted from the patient's displayed TB and Mitogen results. . Lower than expected results with the Mitogen tube prevent false-negative Quantiferon readings by detecting a patient with a potential immune suppressive condition and/or suboptimal pre-analytical specimen handling. Marland Kitchen  The TB1 Antigen tube is coated with the M. tuberculosis-specific antigens designed to elicit responses from TB antigen primed CD4+ helper T-lymphocytes. . The TB2 Antigen tube is coated with the M. tuberculosis-specific antigens designed to elicit responses from TB antigen primed CD4+ helper and CD8+ cytotoxic T-lymphocytes. . For additional information, please refer to https://education.quest[facility].com/faq/FAQ204 (This link is being provided for informational/ educational purposes only.) .   IBC panel     Status: Abnormal   Collection Time: 07/11/23  9:31 AM  Result Value Ref Range   Iron 33 (L) 42 - 145 ug/dL   Transferrin 660.6 301.6 - 360.0 mg/dL   Saturation Ratios 7.4 (L) 20.0 - 50.0 %   TIBC 448.0 250.0 - 450.0 mcg/dL  Comp Met (CMET)     Status: Abnormal   Collection Time: 07/11/23  9:31 AM  Result Value Ref Range   Sodium 143 135 - 145 mEq/L   Potassium 4.0 3.5 - 5.1 mEq/L   Chloride 100 96 - 112 mEq/L   CO2 34 (H) 19 - 32 mEq/L   Glucose, Bld 74 70 - 99 mg/dL   BUN 7 6 - 23 mg/dL   Creatinine, Ser 0.10 0.40 - 1.20 mg/dL   Total Bilirubin 0.3 0.2 - 1.2 mg/dL   Alkaline Phosphatase 84 39 - 117 U/L   AST 19 0 - 37 U/L   ALT 14 0 - 35 U/L   Total  Protein 7.3 6.0 - 8.3 g/dL   Albumin 4.2 3.5 - 5.2 g/dL   GFR 93.23 >55.73 mL/min    Comment: Calculated using the CKD-EPI Creatinine Equation (2021)   Calcium 10.2 8.4 - 10.5 mg/dL  CBC w/Diff     Status: Abnormal   Collection Time: 07/11/23  9:31 AM  Result Value Ref Range   WBC 18.4 Repeated and verified X2. (HH) 4.0 - 10.5 K/uL   RBC 4.40 3.87 - 5.11 Mil/uL   Hemoglobin 12.9 12.0 - 15.0 g/dL   HCT 22.0 25.4 - 27.0 %   MCV 91.3 78.0 - 100.0 fl   MCHC 32.0 30.0 - 36.0 g/dL   RDW 62.3 (H) 76.2 - 83.1 %   Platelets 349.0 150.0 - 400.0 K/uL   Neutrophils Relative % 81.5 (H) 43.0 - 77.0 %   Lymphocytes Relative 10.4 (L) 12.0 - 46.0 %   Monocytes Relative 7.0 3.0 - 12.0 %   Eosinophils Relative 0.8 0.0 - 5.0 %   Basophils Relative 0.3 0.0 - 3.0 %   Neutro Abs 15.0 (H) 1.4 - 7.7 K/uL   Lymphs Abs 1.9 0.7 - 4.0 K/uL   Monocytes Absolute 1.3 (H) 0.1 - 1.0 K/uL   Eosinophils Absolute 0.2 0.0 - 0.7 K/uL   Basophils Absolute 0.1 0.0 - 0.1 K/uL  Pulmonary function test     Status: None   Collection Time: 08/14/23 10:59 AM  Result Value Ref Range   FVC-Pre 1.34 L   FVC-%Pred-Pre 45 %   FVC-Post 1.45 L   FVC-%Pred-Post 49 %   FVC-%Change-Post 8 %   FEV1-Pre 1.12 L   FEV1-%Pred-Pre 48 %   FEV1-Post 1.20 L   FEV1-%Pred-Post 52 %   FEV1-%Change-Post 7 %   FEV6-Pre 1.34 L   FEV6-%Pred-Pre 47 %   FEV6-Post 1.45 L   FEV6-%Pred-Post 51 %   FEV6-%Change-Post 8 %   Pre FEV1/FVC ratio 83 %   FEV1FVC-%Pred-Pre 105 %   Post FEV1/FVC ratio 82 %   FEV1FVC-%Change-Post 0 %   Pre FEV6/FVC Ratio 100 %  FEV6FVC-%Pred-Pre 103 %   Post FEV6/FVC ratio 100 %   FEV6FVC-%Pred-Post 103 %   FEF 25-75 Pre 1.29 L/sec   FEF2575-%Pred-Pre 56 %   FEF 25-75 Post 1.85 L/sec   FEF2575-%Pred-Post 80 %   FEF2575-%Change-Post 43 %   RV 1.45 L   RV % pred 84 %   TLC 2.92 L   TLC % pred 65 %   DLCO unc 7.49 ml/min/mmHg   DLCO unc % pred 41 %   DL/VA 4.69 ml/min/mmHg/L   DL/VA % pred 72 %          has a past medical history of Allergy, Anemia, Anxiety, Arthritis, Cancer (HCC), CAP (community acquired pneumonia) (01/07/2020), Chronic kidney disease, Cigarette smoker (11/05/2018), COPD (chronic obstructive pulmonary disease) (HCC), Depression, Heart murmur, High cholesterol, History of endometriosis, Incontinence, Low back pain radiating to both legs (07/25/2020), Menopausal symptoms, Menopause (01/09/2012), Migraine, Myocardial infarction [facility]), Night sweats (10/05/2019), Osteoporosis, Paresthesia of foot, bilateral (03/28/2019), Productive cough (12/25/2019), Renal stone (06/01/2020), Skin cancer, Thyroid disease, and Tick bite (02/10/2019).   reports that she has been smoking cigarettes. She has a 34 pack-year smoking history. She has been exposed to tobacco smoke. She has never used smokeless tobacco.  Past Surgical History:  Procedure Laterality Date   ANKLE SURGERY     RIGHT   APPENDECTOMY     AUGMENTATION MAMMAPLASTY     BREAST ENHANCEMENT SURGERY     SALINE   BUNIONECTOMY     BILATERAL FEET   LEFT HEART CATH AND CORONARY ANGIOGRAPHY N/A 04/13/2021   Procedure: LEFT HEART CATH AND CORONARY ANGIOGRAPHY;  Surgeon: Marykay Lex, MD;  Location: [facility] INVASIVE CV LAB;  Service: Cardiovascular;  Laterality: N/A;   TOOTH EXTRACTION N/A 07/11/2021   Procedure: DENTAL RESTORATION / EXTRACTIONS x 13;  Surgeon: Ocie Doyne, DMD;  Location: [facility];  Service: Oral Surgery;  Laterality: N/A;   TOTAL ABDOMINAL HYSTERECTOMY W/ BILATERAL SALPINGOOPHORECTOMY     TVT  10/30/2005   WISDOM TOOTH EXTRACTION     X 4    Allergies  Allergen Reactions   Dextromethorphan Other (See Comments)    keeps awake, legs constantly moving    Doxylamine Other (See Comments)    keeps awake, legs constantly moving    Pseudoeph-Doxylamine-Dm-Apap Other (See Comments)    REACTION: keeps awake, legs constantly moving  NYQUIL   Pseudoephedrine Hcl Other (See Comments)    keeps awake, legs  constantly moving     Immunization History  Administered Date(s) Administered   Hepatitis B, PED/ADOLESCENT 07/09/2023   Influenza,inj,Quad PF,6+ Mos 12/10/2020   Influenza-Unspecified 07/09/2023   Pneumococcal-Unspecified 02/10/2021   Td 05/30/2002   Tdap 11/12/2011, 02/16/2021   Zoster Recombinant(Shingrix) 12/12/2020, 02/16/2021    Family History  Problem Relation Age of Onset   Diabetes Mother    Heart disease Mother    Hyperlipidemia Mother    Colon polyps Mother    Tuberculosis Father    Alcohol abuse Father    Heart disease Sister    Hyperlipidemia Sister    Heart disease Brother    Hyperlipidemia Brother    Diabetes Maternal Grandmother    Diabetes Maternal Grandfather    Colon cancer Maternal Uncle    Stomach cancer Maternal Uncle      Current Outpatient Medications:    albuterol (PROVENTIL) (2.5 MG/3ML) 0.083% nebulizer solution, INHALE 3 ML BY NEBULIZATION EVERY 6 HOURS AS NEEDED FOR WHEEZING OR SHORTNESS OF BREATH, Disp: 75 mL, Rfl: 5   aspirin  81 MG chewable tablet, Chew 81 mg by mouth daily., Disp: , Rfl:    Budeson-Glycopyrrol-Formoterol (BREZTRI AEROSPHERE) 160-9-4.8 MCG/ACT AERO, Inhale 2 puffs into the lungs in the morning and at bedtime., Disp: , Rfl:    busPIRone (BUSPAR) 15 MG tablet, Take 1 tablet (15 mg total) by mouth 2 (two) times daily as needed., Disp: 60 tablet, Rfl: 2   ezetimibe (ZETIA) 10 MG tablet, Take 1 tablet (10 mg total) by mouth daily., Disp: 90 tablet, Rfl: 3   gabapentin (NEURONTIN) 300 MG capsule, TAKE 1 CAPSULE BY MOUTH TWICE A DAY, Disp: 60 capsule, Rfl: 2   HYDROcodone bit-homatropine (HYDROMET) 5-1.5 MG/5ML syrup, Take 5 mLs by mouth at bedtime as needed for cough., Disp: 120 mL, Rfl: 0   linaclotide (LINZESS) 145 MCG CAPS capsule, Take 1 capsule (145 mcg total) by mouth daily before breakfast., Disp: 30 capsule, Rfl: 3   nebivolol (BYSTOLIC) 2.5 MG tablet, Take 1 tablet (2.5 mg total) by mouth daily., Disp: 90 tablet, Rfl: 3    nicotine (NICODERM CQ) 21 mg/24hr patch, Place 1 patch (21 mg total) onto the skin daily., Disp: 28 patch, Rfl: 2   nitroGLYCERIN (NITROSTAT) 0.4 MG SL tablet, Place 1 tablet (0.4 mg total) under the tongue every 5 (five) minutes as needed for chest pain., Disp: 25 tablet, Rfl: 1   omeprazole (PRILOSEC) 20 MG capsule, TAKE 1 CAPSULE BY MOUTH EVERY DAY, Disp: 90 capsule, Rfl: 1   ondansetron (ZOFRAN-ODT) 4 MG disintegrating tablet, Take 1 tablet (4 mg total) by mouth every 8 (eight) hours as needed for nausea or vomiting., Disp: 30 tablet, Rfl: 2   ranolazine (RANEXA) 500 MG 12 hr tablet, Take 1 tablet (500 mg total) by mouth 2 (two) times daily., Disp: 180 tablet, Rfl: 3   RESTASIS 0.05 % ophthalmic emulsion, Place 1 drop into both eyes 2 (two) times daily., Disp: , Rfl:    rosuvastatin (CRESTOR) 40 MG tablet, TAKE 1 TABLET BY MOUTH EVERY DAY, Disp: 90 tablet, Rfl: 0   SUMAtriptan (IMITREX) 6 MG/0.5ML SOLN injection, Inject 0.5 mLs (6 mg total) into the skin every 2 (two) hours as needed for migraine or headache. May repeat in 2 hours if headache persists or recurs.  Limit to twice per week., Disp: 0.5 mL, Rfl: 8   traZODone (DESYREL) 50 MG tablet, TAKE 1/2 TO 1 TABLETS BY MOUTH AT BEDTIME AS NEEDED FOR SLEEP., Disp: 90 tablet, Rfl: 1   VENTOLIN HFA 108 (90 Base) MCG/ACT inhaler, INHALE 1-2 PUFFS BY MOUTH EVERY 6 HOURS AS NEEDED FOR WHEEZE OR SHORTNESS OF BREATH, Disp: 18 each, Rfl: 2   Vitamin D, Ergocalciferol, (DRISDOL) 1.25 MG (50000 UNIT) CAPS capsule, TAKE 1 CAPSULE (50,000 UNITS TOTAL) BY MOUTH EVERY 7 (SEVEN) DAYS FOR 12 DOSES., Disp: 12 capsule, Rfl: 0   Vitamin D, Ergocalciferol, (DRISDOL) 1.25 MG (50000 UNIT) CAPS capsule, Take 1 capsule (50,000 Units total) by mouth once a week., Disp: 5 capsule, Rfl: 0      Objective:   Vitals:   09/06/23 1419  BP: 110/72  Pulse: 71  SpO2: 97%  Weight: 118 lb 3.2 oz (53.6 kg)  Height: 5' (1.524 m)    Estimated body mass index is 23.08 kg/m as  calculated from the following:   Height as of this encounter: 5' (1.524 m).   Weight as of this encounter: 118 lb 3.2 oz (53.6 kg).  @WEIGHTCHANGE @  Filed Weights   09/06/23 1419  Weight: 118 lb 3.2 oz (53.6 kg)  Physical Exam   General: No distress. thin O2 at rest: no Cane present: no Sitting in wheel chair: no Frail: no Obese: no Neuro: Alert and Oriented x 3. GCS 15. Speech normal Psych: Pleasant Resp:  Barrel Chest - no.  Wheeze - no, Crackles - YES, No overt respiratory distress CVS: Normal heart sounds. Murmurs - no Ext: Stigmata of Connective Tissue Disease - no HEENT: Normal upper airway. PEERL +. No post nasal drip        Assessment:       ICD-10-CM   1. ILD (interstitial lung disease) (HCC)  J84.9     2. Current smoker  F17.200     3. Exposure to smoke from wood stove  Z77.29     4. Pulmonary emphysema, unspecified emphysema type (HCC)  J43.9     5. Alpha-1-antitrypsin deficiency carrier  Z14.8     6. Cervical adenopathy  R59.0       -I feel that the diagnostic uncertainty with the ILD is what I explained to her.  And there is also progression.  Discussed the role of antifibrotic's but she already has significant GI symptoms the approved antifibrotic's nintedanib and pirfenidone to cause significant GI symptoms therefore we will hold off at this moment..  On the other hand there is diagnostic uncertainty with the ILD therefore biopsy is indicated.  With age indeterminate and alternative diagnosis bronchoscopy with lavage with or any genomic classifier is less likely to produce a yield.  Therefore I recommended surgical lung biopsy.  Given the fact she does not desaturate fully with exertion I think she should be an acceptable risk for surgical lung biopsy.  Went over the complications but also explained that this is the gold standard.  Will make a referral to Dr. Cliffton Asters and or Dr. Dorris Fetch.  She has had a cardiac cath of couple of years ago that did  not show any significant blockage.  In terms of smoking: Decided and discussed with her that she absolutely needs to quit.  She wants to try Chantix.  She does not have bad dreams she does have a firearm that she uses a hunting rifle but she is never had any homicidal thoughts or suicidal thoughts.  Will write for Chantix.  Also gave information about the quit smoking program at [facility] health.  Spent more than 3 minutes discussing smoking quitting.       Plan:     Patient Instructions     ICD-10-CM   1. ILD (interstitial lung disease) (HCC)  J84.9     2. Current smoker  F17.200     3. Exposure to smoke from wood stove  Z77.29     4. Pulmonary emphysema, unspecified emphysema type (HCC)  J43.9     5. Alpha-1-antitrypsin deficiency carrier  Z14.8     6. Cervical adenopathy  R59.0        ILD (interstitial lung disease) (HCC)  - you have ongoing/progressive interstitial lung disease -This was evident in as mild inflammation in 2020 CT chest and since then it progressed steadily through Oct 2024  - type can be NSIP or smokers DIP or other types like UIP  = it is not autoimmune  Plan -refer Dr Dorris Fetch or Dr Cliffton Asters for surgical lung biiopsy - return in 6 weeks - hold off on ofev right now due to getting diagnosis via surgical bipst and need to quit smoking   Emphysema  Plan   -conitnue Breztril daily  - continue albuterolas needed  Smoking  -glad you want to quit  PLAN  - glad you want to quit  - continue nicotine patch -  start chantix as directed  - Days 1-3: 0.5 mg once daily  - Days 4-7: 0.5 mg twice daily  - Maintenance (>= Day 8):  1 mg twice daily for 11 weeks - REFER [facility] quit smoking clinic 09/06/2023    - if you have bad dream stop medication and call us  - take the medicaiton as instructed and after food - quit smoking  1 week after starting chantix     Cervical adenopathy - new finding 07/03/2023   Plan  -keep ENT appt next  week   Follow-up -6 weeks but after surgical biopsy; 30 min visit  - symptom socre and exerxie hypoxemia test   Xxxxxx FOR QUITTING SMOKING Dear [name],   Congratulations for your interest in quitting smoking!  Find a program that suits you best: when you want to quit, how you need support, where you live, and how you like to learn.    If you're ready to get started TODAY, consider scheduling a visit through [email or web address].  Appointments are available from 8am to 8pm, Monday to Friday.   Most health insurance plans will cover some level of tobacco cessation visits and medications.    Additional Resources: [employer] are also available to help you quit & provide the support you'll need. Many programs are available in both [place] and Spanish and have a long history of successfully helping people get off and stay off tobacco.    Quit Smoking Apps:  quitSTART at SeriousBroker.de QuitGuide?at ForgetParking.dk Online education and resources: Smokefree  at [employer].gov Free Telephone Coaching: QuitNow,  Call 1-800-QUIT-NOW (973-801-5141) or Text- Ready to 539-155-1938 *Quitline [place] has teamed up with Medicaid to offer a free 14 week program    Vaping- Want to Quit? Free 24/7 support. Call [facility]  [place], [place], [place], [place], [place]  [place]     FOLLOWUP Return in about 6 weeks (around 10/18/2023) for 30 min visit, with Dr Marchelle Gearing, Face to Face Visit.  ( Level 05 visit E&M 2024: Estb >= 40 min w >= 60 min   in  visit type: on-site physical face to visit  in total care time and counseling or/and coordination of care by this undersigned MD - Dr Kalman Shan. This includes one or more of the following on this same day 09/06/2023: pre-charting, chart review, note writing, documentation discussion of test results, diagnostic or treatment recommendations, prognosis, risks and  benefits of management options, instructions, education, compliance or risk-factor reduction. It excludes time spent by the CMA or office staff in the care of the patient. Actual time 47 min)   SIGNATURE    Dr. Kalman Shan, M.D., F.C.C.P,  Pulmonary and Critical Care Medicine Staff Physician, [facility] Director - Interstitial Lung Disease  Program  Pulmonary Fibrosis [facility] at [facility] [place], [place], [address]  Pager: [phone], If no answer or between  15:00h - 7:00h: call [phone] Telephone: [phone]  3:05 PM 09/06/2023

## 2023-09-06 NOTE — Patient Instructions (Addendum)
ICD-10-CM   1. ILD (interstitial lung disease) (HCC)  J84.9     2. Current smoker  F17.200     3. Exposure to smoke from wood stove  Z77.29     4. Pulmonary emphysema, unspecified emphysema type (HCC)  J43.9     5. Alpha-1-antitrypsin deficiency carrier  Z14.8     6. Cervical adenopathy  R59.0        ILD (interstitial lung disease) (HCC)  - you have ongoing/progressive interstitial lung disease -This was evident in as mild inflammation in 2020 CT chest and since then it progressed steadily through Oct 2024  - type can be NSIP or smokers DIP or other types like UIP  = it is not autoimmune  Plan -refer Dr Dorris Fetch or Dr Cliffton Asters for surgical lung biiopsy - return in 6 weeks - hold off on ofev right now due to getting diagnosis via surgical bipst and need to quit smoking   Emphysema  Plan   -conitnue Breztril daily  - continue albuterolas needed  Smoking  -glad you want to quit  PLAN  - glad you want to quit  - continue nicotine patch -  start chantix as directed  - Days 1-3: 0.5 mg once daily  - Days 4-7: 0.5 mg twice daily  - Maintenance (>= Day 8):  1 mg twice daily for 11 weeks - REFER [facility] quit smoking clinic 09/06/2023    - if you have bad dream stop medication and call us  - take the medicaiton as instructed and after food - quit smoking  1 week after starting chantix     Cervical adenopathy - new finding 07/03/2023   Plan  -keep ENT appt next week   Follow-up -6 weeks but after surgical biopsy; 30 min visit  - symptom socre and exerxie hypoxemia test   Xxxxxx FOR QUITTING SMOKING Dear [name],   Congratulations for your interest in quitting smoking!  Find a program that suits you best: when you want to quit, how you need support, where you live, and how you like to learn.    If you're ready to get started TODAY, consider scheduling a visit through [facility] [email or web address].  Appointments are available from 8am to  8pm, Monday to Friday.   Most health insurance plans will cover some level of tobacco cessation visits and medications.    Additional Resources: [employer] are also available to help you quit & provide the support you'll need. Many programs are available in both [place] and Spanish and have a long history of successfully helping people get off and stay off tobacco.    Quit Smoking Apps:  quitSTART at [employer]iousBroker.de QuitGuide?at ForgetParking.dk Online education and resources: Smokefree  at [employer].gov Free Telephone Coaching: QuitNow,  Call 1-800-QUIT-NOW (715-605-4317) or Text- Ready to (310) 863-2454 *Quitline  has teamed up with Medicaid to offer a free 14 week program    Vaping- Want to Quit? Free 24/7 support. Call [facility]  [place], [place], [place], [place], [place]  [facility]

## 2023-09-07 ENCOUNTER — Encounter: Payer: Self-pay | Admitting: Internal Medicine

## 2023-09-07 ENCOUNTER — Telehealth (HOSPITAL_COMMUNITY): Payer: Medicaid Other | Admitting: Psychiatry

## 2023-09-07 LAB — SURGICAL PATHOLOGY

## 2023-09-10 ENCOUNTER — Telehealth: Payer: Self-pay | Admitting: Internal Medicine

## 2023-09-10 DIAGNOSIS — J849 Interstitial pulmonary disease, unspecified: Secondary | ICD-10-CM

## 2023-09-10 NOTE — Telephone Encounter (Signed)
Per Dr. Jane Canary office note from 09/06/2023, refer to Dr. Dorris Fetch or Dr. Cliffton Asters. I have placed the referral and I have asked the patient care coordinator to send it ASAP.  She said she quit smoking after her visit on 11/7.Since then her coughing has increased. She is still having the same dry cough,but she is coughing more frequently. She also, said it is painful when she coughs, stretches or bends over.This has been going on for awhile but she did not mention it at her appt on 11/7.  She wants to know what she can do for the pain?   Dr. Marchelle Gearing, please advise about the cough and pain. Thank you!

## 2023-09-10 NOTE — Telephone Encounter (Signed)
Having pain while coughing and says its getting worst and worst. Pt is wants to know when is she supposed to get her biopsy scheduled Pls advise

## 2023-09-11 MED ORDER — PREDNISONE 10 MG PO TABS: ORAL_TABLET | ORAL | 0 refills | Status: AC

## 2023-09-11 NOTE — Telephone Encounter (Signed)
I called and spoke with the pt and notified of response per MR  She verbalized understanding  Nothing further needed

## 2023-09-11 NOTE — Telephone Encounter (Signed)
We can try a short course prednisone to kick the cough out  Plan  - Please take prednisone 40 mg x1 day, then 30 mg x1 day, then 20 mg x1 day, then 10 mg x1 day, and then 5 mg x1 day and stop

## 2023-09-11 NOTE — Telephone Encounter (Signed)
Called and spoke with patient, advised of recommendations per Dr. Marchelle Gearing.  Verified pharmacy and script sent.  She asked about cough medication as she cannot sleep.  She says she has tried Delsym and dimetapp with out any relief.  I let her know that I would send a message back to Dr. Phillis Knack and let him know that she is requesting cough medication.  Dr. Corlis Hove, please advise regarding cough medication to allow her to sleep at night.

## 2023-09-11 NOTE — Telephone Encounter (Signed)
That is the idea with prednisone -> reduce inflammation -> reduce cough. If this does not work, try other methods

## 2023-09-12 ENCOUNTER — Ambulatory Visit: Payer: Medicaid Other | Admitting: Cardiology

## 2023-09-12 ENCOUNTER — Encounter (INDEPENDENT_AMBULATORY_CARE_PROVIDER_SITE_OTHER): Payer: Self-pay

## 2023-09-12 ENCOUNTER — Telehealth (INDEPENDENT_AMBULATORY_CARE_PROVIDER_SITE_OTHER): Payer: Self-pay | Admitting: Otolaryngology

## 2023-09-12 DIAGNOSIS — Z1231 Encounter for screening mammogram for malignant neoplasm of breast: Secondary | ICD-10-CM

## 2023-09-12 NOTE — Telephone Encounter (Signed)
Left vm with appointment and location

## 2023-09-13 ENCOUNTER — Telehealth: Payer: Self-pay | Admitting: Orthopaedic Surgery

## 2023-09-13 ENCOUNTER — Ambulatory Visit (INDEPENDENT_AMBULATORY_CARE_PROVIDER_SITE_OTHER): Payer: Medicaid Other | Admitting: Otolaryngology

## 2023-09-13 ENCOUNTER — Ambulatory Visit: Payer: Medicaid Other | Admitting: Orthopaedic Surgery

## 2023-09-13 ENCOUNTER — Encounter (INDEPENDENT_AMBULATORY_CARE_PROVIDER_SITE_OTHER): Payer: Self-pay

## 2023-09-13 VITALS — Ht 60.0 in | Wt 118.0 lb

## 2023-09-13 DIAGNOSIS — M545 Low back pain, unspecified: Secondary | ICD-10-CM | POA: Diagnosis not present

## 2023-09-13 DIAGNOSIS — R220 Localized swelling, mass and lump, head: Secondary | ICD-10-CM

## 2023-09-13 DIAGNOSIS — R0602 Shortness of breath: Secondary | ICD-10-CM | POA: Diagnosis not present

## 2023-09-13 DIAGNOSIS — R221 Localized swelling, mass and lump, neck: Secondary | ICD-10-CM | POA: Diagnosis not present

## 2023-09-13 DIAGNOSIS — G8929 Other chronic pain: Secondary | ICD-10-CM | POA: Diagnosis not present

## 2023-09-13 DIAGNOSIS — R490 Dysphonia: Secondary | ICD-10-CM

## 2023-09-13 DIAGNOSIS — J849 Interstitial pulmonary disease, unspecified: Secondary | ICD-10-CM

## 2023-09-13 MED ORDER — TRAMADOL HCL 50 MG PO TABS: 50.0000 mg | ORAL_TABLET | Freq: Every day | ORAL | 0 refills | Status: DC | PRN

## 2023-09-13 NOTE — Progress Notes (Signed)
Dear Dr. Marchelle Gearing, Here is my assessment for our mutual patient, [name]. Thank you for allowing me the opportunity to care for your patient. Please do not hesitate to contact me should you have any other questions. Sincerely, Dr. Jovita Kussmaul  Otolaryngology Clinic Note Referring provider: Dr. Marchelle Gearing HPI:  [name] is a 57 y.o. female kindly referred by Dr. Marchelle Gearing for evaluation of bilateral submandibular swelling. Dr. Marchelle Gearing noticed it about 2 months ago, it is bilateral. It has not really changed any. Not particularly painful. Some dry cough, no bleeding. No significant ear pain, no odynophagia, dysphagia, weight loss, fevers/chills, other neck masses. No prior abscesses. She is edentulous. She points to her submandibular gland/area for where the swelling is. She denies prior H&N surgery. Generally otherwise asymptomatic from H&N area except for cough and SOB (presumed pulmonary origin).   She has ILD, and she is going to go for a lung biopsy in December 2024.  H&N Surgery: no Personal or FHx of bleeding dz or anesthesia difficulty: no  AP/AC: on ASA 81  Tobacco: quit 2024, extensive histor  PMHx: Mitral valve prolapse, HLD, Anxiety, ILD, Emphysema  Independent Review of Additional Tests or Records:  Dr. Marchelle Gearing: coughing since quit smoking in Nov, prescribed pred taper Cardiology notes: Upcoming heart cath, some CAD  07/2023 (Pulm labs/Rheum labs): Sed, ANA, RF = neg Eos 01/2021: 700 IgE = 2 Rast: neg  CT Chest non con (2024): 1. Progressive interstitial lung disease once again categorized as probable usual interstitial pneumonia (UIP) per current ATS No large pulm nodules on independent review  EGD 09/04/2023:   MRI Head 2023: independently reviewed - minimal sphenoid disease, no other significant sinonasal disease; mild septal dev right  PMH/Meds/All/SocHx/FamHx/ROS:   Past Medical History:  Diagnosis Date   Allergy    Anemia    during pregnancies    Anxiety    Arthritis    hands and hip   Cancer (HCC)    CAP (community acquired pneumonia) 01/07/2020   Chronic kidney disease    KIDNEYSTONES   Cigarette smoker 11/05/2018   COPD (chronic obstructive pulmonary disease) (HCC)    Depression    Heart murmur    MITRAL VALVE PROLASP   High cholesterol    History of endometriosis    Incontinence    Low back pain radiating to both legs 07/25/2020   Menopausal symptoms    Menopause 01/09/2012   Migraine    Myocardial infarction (HCC)    silent 3 years ago   Night sweats 10/05/2019   Osteoporosis    Paresthesia of foot, bilateral 03/28/2019   Productive cough 12/25/2019   Renal stone 06/01/2020   Skin cancer    Thyroid disease    Tick bite 02/10/2019     Past Surgical History:  Procedure Laterality Date   ANKLE SURGERY     RIGHT   APPENDECTOMY     AUGMENTATION MAMMAPLASTY     BREAST ENHANCEMENT SURGERY     SALINE   BUNIONECTOMY     BILATERAL FEET   LEFT HEART CATH AND CORONARY ANGIOGRAPHY N/A 04/13/2021   Procedure: LEFT HEART CATH AND CORONARY ANGIOGRAPHY;  Surgeon: Marykay Lex, MD;  Location: [facility];  Service: Cardiovascular;  Laterality: N/A;   TOOTH EXTRACTION N/A 07/11/2021   Procedure: DENTAL RESTORATION / EXTRACTIONS x 13;  Surgeon: Ocie Doyne, DMD;  Location: [facility];  Service: Oral Surgery;  Laterality: N/A;   TOTAL ABDOMINAL HYSTERECTOMY W/ BILATERAL SALPINGOOPHORECTOMY  TVT  10/30/2005   WISDOM TOOTH EXTRACTION     X 4    Family History  Problem Relation Age of Onset   Diabetes Mother    Heart disease Mother    Hyperlipidemia Mother    Colon polyps Mother    Tuberculosis Father    Alcohol abuse Father    Heart disease Sister    Hyperlipidemia Sister    Heart disease Brother    Hyperlipidemia Brother    Diabetes Maternal Grandmother    Diabetes Maternal Grandfather    Colon cancer Maternal Uncle    Stomach cancer Maternal Uncle      Social Connections:  Socially Isolated (03/21/2023)   Social Connection and Isolation Panel [NHANES]    Frequency of Communication with Friends and Family: Once a week    Frequency of Social Gatherings with Friends and Family: Once a week    Attends Religious Services: Never    Database administrator or Organizations: No    Attends Engineer, structural: Not on file    Marital Status: Divorced      Current Outpatient Medications:    albuterol (PROVENTIL) (2.5 MG/3ML) 0.083% nebulizer solution, INHALE 3 ML BY NEBULIZATION EVERY 6 HOURS AS NEEDED FOR WHEEZING OR SHORTNESS OF BREATH, Disp: 75 mL, Rfl: 5   aspirin 81 MG chewable tablet, Chew 81 mg by mouth daily., Disp: , Rfl:    Budeson-Glycopyrrol-Formoterol (BREZTRI AEROSPHERE) 160-9-4.8 MCG/ACT AERO, Inhale 2 puffs into the lungs in the morning and at bedtime., Disp: , Rfl:    busPIRone (BUSPAR) 15 MG tablet, Take 1 tablet (15 mg total) by mouth 2 (two) times daily as needed., Disp: 60 tablet, Rfl: 2   ezetimibe (ZETIA) 10 MG tablet, Take 1 tablet (10 mg total) by mouth daily., Disp: 90 tablet, Rfl: 3   gabapentin (NEURONTIN) 300 MG capsule, TAKE 1 CAPSULE BY MOUTH TWICE A DAY, Disp: 60 capsule, Rfl: 2   HYDROcodone bit-homatropine (HYDROMET) 5-1.5 MG/5ML syrup, Take 5 mLs by mouth at bedtime as needed for cough., Disp: 120 mL, Rfl: 0   linaclotide (LINZESS) 145 MCG CAPS capsule, Take 1 capsule (145 mcg total) by mouth daily before breakfast., Disp: 30 capsule, Rfl: 3   nebivolol (BYSTOLIC) 2.5 MG tablet, Take 1 tablet (2.5 mg total) by mouth daily., Disp: 90 tablet, Rfl: 3   nicotine (NICODERM CQ) 21 mg/24hr patch, Place 1 patch (21 mg total) onto the skin daily., Disp: 28 patch, Rfl: 2   nitroGLYCERIN (NITROSTAT) 0.4 MG SL tablet, Place 1 tablet (0.4 mg total) under the tongue every 5 (five) minutes as needed for chest pain., Disp: 25 tablet, Rfl: 1   omeprazole (PRILOSEC) 20 MG capsule, TAKE 1 CAPSULE BY MOUTH EVERY DAY, Disp: 90 capsule, Rfl: 1    ondansetron (ZOFRAN-ODT) 4 MG disintegrating tablet, Take 1 tablet (4 mg total) by mouth every 8 (eight) hours as needed for nausea or vomiting., Disp: 30 tablet, Rfl: 2   predniSONE (DELTASONE) 10 MG tablet, Take 4 tablets (40 mg total) by mouth daily with breakfast for 1 day, THEN 3 tablets (30 mg total) daily with breakfast for 1 day, THEN 2 tablets (20 mg total) daily with breakfast for 1 day, THEN 1 tablet (10 mg total) daily with breakfast for 1 day, THEN 0.5 tablets (5 mg total) daily with breakfast for 1 day., Disp: 10.5 tablet, Rfl: 0   ranolazine (RANEXA) 500 MG 12 hr tablet, Take 1 tablet (500 mg total) by mouth 2 (two) times  daily., Disp: 180 tablet, Rfl: 3   RESTASIS 0.05 % ophthalmic emulsion, Place 1 drop into both eyes 2 (two) times daily., Disp: , Rfl:    rosuvastatin (CRESTOR) 40 MG tablet, TAKE 1 TABLET BY MOUTH EVERY DAY, Disp: 90 tablet, Rfl: 0   SUMAtriptan (IMITREX) 6 MG/0.5ML SOLN injection, Inject 0.5 mLs (6 mg total) into the skin every 2 (two) hours as needed for migraine or headache. May repeat in 2 hours if headache persists or recurs.  Limit to twice per week., Disp: 0.5 mL, Rfl: 8   traZODone (DESYREL) 50 MG tablet, TAKE 1/2 TO 1 TABLETS BY MOUTH AT BEDTIME AS NEEDED FOR SLEEP., Disp: 90 tablet, Rfl: 1   VENTOLIN HFA 108 (90 Base) MCG/ACT inhaler, INHALE 1-2 PUFFS BY MOUTH EVERY 6 HOURS AS NEEDED FOR WHEEZE OR SHORTNESS OF BREATH, Disp: 18 each, Rfl: 2   Vitamin D, Ergocalciferol, (DRISDOL) 1.25 MG (50000 UNIT) CAPS capsule, TAKE 1 CAPSULE (50,000 UNITS TOTAL) BY MOUTH EVERY 7 (SEVEN) DAYS FOR 12 DOSES., Disp: 12 capsule, Rfl: 0   Vitamin D, Ergocalciferol, (DRISDOL) 1.25 MG (50000 UNIT) CAPS capsule, Take 1 capsule (50,000 Units total) by mouth once a week., Disp: 5 capsule, Rfl: 0   Physical Exam:   Ht 5' (1.524 m)   Wt 118 lb (53.5 kg)   BMI 23.05 kg/m   Salient findings:  CN II-XII intact Bilateral EAC clear and TM intact with well pneumatized middle ear  spaces Anterior rhinoscopy: Septum relatively midline - left septal deviation; bilateral inferior turbinates without significant hypertrophy No lesions of oral cavity/oropharynx; edentulous No obviously palpable neck masses/lymphadenopathy/thyromegaly - the area of concern appears to be soft, bilateral ptotic submandibular glands without adenopathy; no surrounding skin changes or fluctuance No respiratory distress or stridor; voice quality class II  Seprately Identifiable Procedures:  Procedure Note Pre-procedure diagnosis:  Shortness of breath, dysphonia, neck swelling Post-procedure diagnosis: Same Procedure: Transnasal Fiberoptic Laryngoscopy, CPT 82956 - Mod 25 Indication: Shortness of breath, dysphonia, neck swelling Complications: None apparent EBL: 0 mL Date: 09/13/23   The procedure was undertaken to further evaluate the patient's complaint of Shortness of breath, dysphonia, neck swelling, with mirror exam inadequate for appropriate examination due to gag reflex and poor patient tolerance  Procedure:  Patient was identified as correct patient. Verbal consent was obtained. The nose was sprayed with oxymetazoline and 4% lidocaine. The The flexible laryngoscope was passed through the nose to view the nasal cavity, pharynx (oropharynx, hypopharynx) and larynx.  The larynx was examined at rest and during multiple phonatory tasks. Documentation was obtained and reviewed with patient. The scope was removed. The patient tolerated the procedure well.  Findings: The nasal cavity and nasopharynx did not reveal any masses or lesions, mucosa appeared to be without obvious lesions. The tongue base, pharyngeal walls, piriform sinuses, vallecula, epiglottis and postcricoid region are normal in appearance without significant retained secretions. The visualized portion of the subglottis and proximal trachea is widely patent. The vocal folds are mobile bilaterally. There are no lesions on the free edge of  the vocal folds nor elsewhere in the larynx worrisome for malignancy.      Electronically signed by: Read Drivers, MD 09/13/2023 8:34 AM   Impression & Plans:  [name] is a 57 y.o. female with history of ILD and smoking now with: 1. Submandibular swelling   2. ILD (interstitial lung disease) (HCC)   3. Dysphonia   Submandibular swelling incidentally noted, soft, appears to be ptotic submandibular glands. We  discussed that given benign appearance including on TFL without any masses, that is the most likely explanation. Even then, we could obtain a CT Neck but she was reassured and will follow up as needed    Thank you for allowing me the opportunity to care for your patient. Please do not hesitate to contact me should you have any other questions.  Sincerely, Jovita Kussmaul, MD Otolarynoglogist (ENT), [facility] Phone: [phone] Fax: [phone]  09/13/2023, 8:34 AM  I have personally spent 47 minutes involved in face-to-face and non-face-to-face activities for this patient on the day of the visit.  Professional time spent includes the following activities, in addition to those noted in the documentation: preparing to see the patient (review of outside documentation and results), performing a medically appropriate examination and/or evaluation, counseling and educating the patient/family/caregiver, ordering medications, performing procedures (TFL), referring and communicating with other healthcare professionals, documenting clinical information in the electronic or other health record, independently interpreting results and communicating results with the patient/family/caregiver.

## 2023-09-13 NOTE — Progress Notes (Signed)
Office Visit Note   Patient: [name]           Date of Birth: 1966-01-22           MRN: [id number] Visit Date: 09/13/2023              Requested by: [name], NP [address] [place],  [place] [address] PCP: [name], NP   Assessment & Plan: Visit Diagnoses:  1. Chronic bilateral low back pain without sciatica     Plan: [name] is a 57 year old female with chronic low back pain with bilateral leg pain.  We will send her to physical therapy for 6 weeks.  She will message me if her symptoms do not improve at which point we will reorder the MRI of the lumbar spine.  I will send in prescription for tramadol for her to try.  Follow-Up Instructions: No follow-ups on file.   Orders:  Orders Placed This Encounter  Procedures   Ambulatory referral to Physical Therapy   Meds ordered this encounter  Medications   traMADol (ULTRAM) 50 MG tablet    Sig: Take 1-2 tablets (50-100 mg total) by mouth daily as needed.    Dispense:  20 tablet    Refill:  0      Procedures: No procedures performed   Clinical Data: No additional findings.   Subjective: Chief Complaint  Patient presents with   Middle Back - Pain    HPI [name] is a 57 year old female who returns today for follow-up evaluation of low back pain and bilateral leg pain.  MRI was denied by her insurance.  Review of Systems  Constitutional: Negative.   HENT: Negative.    Eyes: Negative.   Respiratory: Negative.    Cardiovascular: Negative.   Endocrine: Negative.   Musculoskeletal: Negative.   Neurological: Negative.   Hematological: Negative.   Psychiatric/Behavioral: Negative.    All other systems reviewed and are negative.    Objective: Vital Signs: There were no vitals taken for this visit.  Physical Exam Vitals and nursing note reviewed.  Constitutional:      Appearance: She is well-developed.  HENT:     Head: Atraumatic.     Nose: Nose normal.  Eyes:     Extraocular  Movements: Extraocular movements intact.  Cardiovascular:     Pulses: Normal pulses.  Pulmonary:     Effort: Pulmonary effort is normal.  Abdominal:     Palpations: Abdomen is soft.  Musculoskeletal:     Cervical back: Neck supple.  Skin:    General: Skin is warm.     Capillary Refill: Capillary refill takes less than 2 seconds.  Neurological:     Mental Status: She is alert. Mental status is at baseline.  Psychiatric:        Behavior: Behavior normal.        Thought Content: Thought content normal.        Judgment: Judgment normal.     Ortho Exam Exam of the lumbar spine is unchanged from prior visit. Specialty Comments:  No specialty comments available.  Imaging: No results found.   PMFS History: Patient Active Problem List   Diagnosis Date Noted   Iron deficiency 12/30/2022   Vitamin D deficiency 08/19/2022   Urinary incontinence 04/09/2022   ILD (interstitial lung disease) (HCC) 06/27/2021   Unstable angina (HCC) 04/13/2021   Atypical nevi 03/17/2021   Atherosclerosis 03/08/2021   Grief reaction 03/02/2021   Acute diarrhea 12/29/2020  Acute right lumbar radiculopathy 12/11/2020   Encounter for smoking cessation counseling 12/11/2020   Acute lumbar back pain 07/25/2020   COPD (chronic obstructive pulmonary disease) (HCC) 06/01/2020   GAD (generalized anxiety disorder) 12/26/2019   Poor social situation 08/29/2019   Abnormal chest CT 08/29/2019   Poor dentition 06/25/2019   Lung nodule, solitary 06/25/2019   Chronic cough 11/26/2018   Recent unintentional weight loss over several months 11/05/2018   Current smoker 11/05/2018   Paresthesia 07/22/2018   Skull deformity 07/22/2018   Hyperlipidemia 07/22/2018   MDD (major depressive disorder) 01/09/2012   Migraine with aura 05/09/2007   Mitral valve prolapse 05/09/2007   Past Medical History:  Diagnosis Date   Allergy    Anemia    during pregnancies   Anxiety    Arthritis    hands and hip   Cancer  (HCC)    CAP (community acquired pneumonia) 01/07/2020   Chronic kidney disease    KIDNEYSTONES   Cigarette smoker 11/05/2018   COPD (chronic obstructive pulmonary disease) (HCC)    Depression    Heart murmur    MITRAL VALVE PROLASP   High cholesterol    History of endometriosis    Incontinence    Low back pain radiating to both legs 07/25/2020   Menopausal symptoms    Menopause 01/09/2012   Migraine    Myocardial infarction (HCC)    silent 3 years ago   Night sweats 10/05/2019   Osteoporosis    Paresthesia of foot, bilateral 03/28/2019   Productive cough 12/25/2019   Renal stone 06/01/2020   Skin cancer    Thyroid disease    Tick bite 02/10/2019    Family History  Problem Relation Age of Onset   Diabetes Mother    Heart disease Mother    Hyperlipidemia Mother    Colon polyps Mother    Tuberculosis Father    Alcohol abuse Father    Heart disease Sister    Hyperlipidemia Sister    Heart disease Brother    Hyperlipidemia Brother    Diabetes Maternal Grandmother    Diabetes Maternal Grandfather    Colon cancer Maternal Uncle    Stomach cancer Maternal Uncle     Past Surgical History:  Procedure Laterality Date   ANKLE SURGERY     RIGHT   APPENDECTOMY     AUGMENTATION MAMMAPLASTY     BREAST ENHANCEMENT SURGERY     SALINE   BUNIONECTOMY     BILATERAL FEET   LEFT HEART CATH AND CORONARY ANGIOGRAPHY N/A 04/13/2021   Procedure: LEFT HEART CATH AND CORONARY ANGIOGRAPHY;  Surgeon: Marykay Lex, MD;  Location: [facility];  Service: Cardiovascular;  Laterality: N/A;   TOOTH EXTRACTION N/A 07/11/2021   Procedure: DENTAL RESTORATION / EXTRACTIONS x 13;  Surgeon: Ocie Doyne, DMD;  Location: [facility];  Service: Oral Surgery;  Laterality: N/A;   TOTAL ABDOMINAL HYSTERECTOMY W/ BILATERAL SALPINGOOPHORECTOMY     TVT  10/30/2005   WISDOM TOOTH EXTRACTION     X 4   Social History   Occupational History    Comment: Disabled  Tobacco Use    Smoking status: Every Day    Current packs/day: 1.00    Average packs/day: 1 pack/day for 34.0 years (34.0 ttl pk-yrs)    Types: Cigarettes    Passive exposure: Current   Smokeless tobacco: Never   Tobacco comments:    0.5-1ppd as of 07/03/23 Tay  Vaping Use   Vaping status: Never  Used  Substance and Sexual Activity   Alcohol use: Yes    Comment: 1-2 times monthly   Drug use: No   Sexual activity: Yes    Partners: Male    Birth control/protection: Surgical

## 2023-09-13 NOTE — Telephone Encounter (Signed)
Called patient. Left message on machine that prior auth was sent. Waiting on response. Cover my meds said that it could take anywhere from 24hr-5 days to get a response on approval or not. I ask her to continue checking with pharmacy.

## 2023-09-13 NOTE — Telephone Encounter (Signed)
Pt had an appt today. Pt called stating Dr Roda Shutters sent medication into pharmacy and they need pre auth. Please call pharmacy with pre auth. Pt phone number is [phone].

## 2023-09-17 ENCOUNTER — Ambulatory Visit: Payer: Medicaid Other | Admitting: Cardiology

## 2023-09-17 ENCOUNTER — Telehealth: Payer: Self-pay | Admitting: Orthopaedic Surgery

## 2023-09-17 ENCOUNTER — Other Ambulatory Visit: Payer: Self-pay | Admitting: Physician Assistant

## 2023-09-17 MED ORDER — ACETAMINOPHEN-CODEINE 300-30 MG PO TABS: 1.0000 | ORAL_TABLET | Freq: Three times a day (TID) | ORAL | 0 refills | Status: DC | PRN

## 2023-09-17 NOTE — Telephone Encounter (Signed)
Notified patient.

## 2023-09-17 NOTE — Telephone Encounter (Signed)
I sent in tylenol 3 to try, but we cannot do anything stronger like a narcotic

## 2023-09-17 NOTE — Telephone Encounter (Signed)
Patient called advised the medication is not working (Tramadol) Patient said she was to call in and Dr. Roda Shutters would prescribe something else for her. The number to contact patient is [phone]

## 2023-09-19 ENCOUNTER — Telehealth: Payer: Self-pay | Admitting: Orthopaedic Surgery

## 2023-09-19 NOTE — Telephone Encounter (Signed)
Patient called advised her Rx Tylenol 3 need prior authorization before the pharmacy will fill the Rx.  The number to contact patient is [phone]

## 2023-09-19 NOTE — Telephone Encounter (Signed)
Tried to call patient. No answer. No voicemail. Prior auth information has been submitted. Just waiting on insurance approval or denial.

## 2023-09-21 ENCOUNTER — Other Ambulatory Visit: Payer: Self-pay | Admitting: Family

## 2023-09-21 DIAGNOSIS — F419 Anxiety disorder, unspecified: Secondary | ICD-10-CM

## 2023-09-24 ENCOUNTER — Ambulatory Visit (HOSPITAL_COMMUNITY): Payer: Medicaid Other | Admitting: Mental Health

## 2023-09-24 NOTE — Telephone Encounter (Signed)
Call patient with update. Patient established with Psychiatry. Request refills from the same. Please let me know if I can further assist. Thank you.

## 2023-09-25 NOTE — Telephone Encounter (Signed)
Call patient with update. Patient established with Psychiatry. Request refills from the same. Please let me know if I can further assist. Thank you.

## 2023-09-30 NOTE — Progress Notes (Unsigned)
Cardiology Office Note    Date:  10/01/2023  ID:  Natalie, [name] 07-17-1966, MRN [id number] PCP:  [name], NP  Cardiologist:  Kristeen Miss, MD  Electrophysiologist:  None   Chief Complaint: f/u chest pain  History of Present Illness: .    [name] is a 57 y.o. female with visit-pertinent history of chronic intermittent chest pain, mild CAD by cath 2022, COPD, ILD (last CT 07/2023), HLD, migraines, anxiety, MVP, tobacco abuse, mild carotid artery disease (1-39% BICA 07/2023) seen for follow-up chest pain.  She has prior history of chest pain with mixed features. She was initially seen here in 02/2021. EKG in 01/2021 had shown diffuse TWI. Her initial symptoms were felt possibly pericarditis following Covid infection so she was treated with colchicine. [name] was deferred due to severe wheezing on exam. Despite colchicine, she continued to have symptoms so cath was pursued 03/2021 showing 20% ostial-prox LAD with otherwise luminal irregularities, normal EF, normal LVEDP. 2D echo 04/2021 EF 60-65%, mild MVP, trivial MR. In follow-up 02/2022 she was still having sharp jabbing chest pains felt possible due to MVP. She previously had severe HA after SL NTG. She recently saw Perlie Gold PA-C 07/2023 for episode of chest pain in the setting of increased caregiver stress; her terminally ill mother passed away a few months ago of lung CA. She had taken ASA which alleviated symptoms within an hour. SBP was 100. Per Evan's discussion with DOD, she was recommended to start Bystolic, Zetia, and Ranexa. She also follows with Dr. Marchelle Gearing who recently recommended referral to CVTS for surgical lung biopsy given ongoing/progressive ILD by CT in September. She has also been following with ENT for cervical adenopathy with their note indicating reassuring evaluation.  She returns for follow-up today reporting that she continues to get intermittent chest pain about 1x/month occurring primarily without  provocation. She has never been totally free of episodes even since prior cath. She tried Arts development officer for 6 days and it made her feel absolutely terrible, as if she was going to pass out. This symptom eased off once she stopped it. She was unaware she was supposed to start Ranexa. Her most recent episode of chest discomfort happened 3 days ago while watching TV.  These episodes are stereotypical of what she had that prompted cath in 2022. When it happens she feels left sided chest pain with radiation to her left arm and jaw described as a tightness as well as some SOB and feeling a little sweaty. In each episode she will take 1 additional baby ASA beyond her baseline 81mg  daily and it always goes away within an hour. She has chronic nausea that's unchanged. The chest pain is not reliably provoked by any exertion. She does not exercise but tries to remain active when she watches her 6yo grandson and generally does well with this. She is tolerating Zetia. She has chronic dyspnea in setting of her ILD. She has quit smoking.   Labwork independently reviewed: 07/2023 WBC 18, Hgb 12.9, plt 349, K 4.0, Cr 0.77, LFTs ok, BNP wnl, ESR 35, ANA wnl, RF wnl, quantiferon neg 02/2022 LDL 137, trig 115 2019 TSH wnl  ROS: .    Please see the history of present illness.  All other systems are reviewed and otherwise negative.  Studies Reviewed: Marland Kitchen    EKG:  EKG is ordered today, personally reviewed, demonstrating NSR 80bpm, rightward axis, IRBBB, diffuse TWI inferiorly and V3-V6. QTc . The inferior TW changes appear  chronic. The TWI in V3-V6 were present in 2022 then resolved in interim tracings.  CV Studies: Cardiac studies reviewed are outlined and summarized above. Otherwise please see EMR for full report.   Current Reported Medications:.    Current Meds  Medication Sig   acetaminophen-codeine (TYLENOL #3) 300-30 MG tablet Take 1 tablet by mouth every 8 (eight) hours as needed for moderate pain (pain score  4-6).   albuterol (PROVENTIL) (2.5 MG/3ML) 0.083% nebulizer solution INHALE 3 ML BY NEBULIZATION EVERY 6 HOURS AS NEEDED FOR WHEEZING OR SHORTNESS OF BREATH   aspirin 81 MG chewable tablet Chew 81 mg by mouth daily.   Budeson-Glycopyrrol-Formoterol (BREZTRI AEROSPHERE) 160-9-4.8 MCG/ACT AERO Inhale 2 puffs into the lungs in the morning and at bedtime.   busPIRone (BUSPAR) 15 MG tablet Take 1 tablet (15 mg total) by mouth 2 (two) times daily as needed.   ezetimibe (ZETIA) 10 MG tablet Take 1 tablet (10 mg total) by mouth daily.   gabapentin (NEURONTIN) 300 MG capsule TAKE 1 CAPSULE BY MOUTH TWICE A DAY   HYDROcodone bit-homatropine (HYDROMET) 5-1.5 MG/5ML syrup Take 5 mLs by mouth at bedtime as needed for cough.   linaclotide (LINZESS) 145 MCG CAPS capsule Take 1 capsule (145 mcg total) by mouth daily before breakfast.   nicotine (NICODERM CQ) 21 mg/24hr patch Place 1 patch (21 mg total) onto the skin daily. (Patient taking differently: Place 21 mg onto the skin as needed (feel the urge).)   nitroGLYCERIN (NITROSTAT) 0.4 MG SL tablet Place 1 tablet (0.4 mg total) under the tongue every 5 (five) minutes as needed for chest pain.   omeprazole (PRILOSEC) 20 MG capsule TAKE 1 CAPSULE BY MOUTH EVERY DAY   ondansetron (ZOFRAN-ODT) 4 MG disintegrating tablet Take 1 tablet (4 mg total) by mouth every 8 (eight) hours as needed for nausea or vomiting.   ranolazine (RANEXA) 500 MG 12 hr tablet Take 1 tablet (500 mg total) by mouth 2 (two) times daily.   RESTASIS 0.05 % ophthalmic emulsion Place 1 drop into both eyes 2 (two) times daily.   rosuvastatin (CRESTOR) 40 MG tablet TAKE 1 TABLET BY MOUTH EVERY DAY   SUMAtriptan (IMITREX) 6 MG/0.5ML SOLN injection Inject 0.5 mLs (6 mg total) into the skin every 2 (two) hours as needed for migraine or headache. May repeat in 2 hours if headache persists or recurs.  Limit to twice per week.   traMADol (ULTRAM) 50 MG tablet Take 1-2 tablets (50-100 mg total) by mouth daily  as needed.   traZODone (DESYREL) 50 MG tablet TAKE 1/2 TO 1 TABLETS BY MOUTH AT BEDTIME AS NEEDED FOR SLEEP.   VENTOLIN HFA 108 (90 Base) MCG/ACT inhaler INHALE 1-2 PUFFS BY MOUTH EVERY 6 HOURS AS NEEDED FOR WHEEZE OR SHORTNESS OF BREATH   Vitamin D, Ergocalciferol, (DRISDOL) 1.25 MG (50000 UNIT) CAPS capsule Take 1 capsule (50,000 Units total) by mouth once a week.    Physical Exam:    VS:  BP (!) 104/58   Pulse 80   Ht 5' (1.524 m)   Wt 124 lb (56.2 kg)   SpO2 97%   BMI 24.22 kg/m    Wt Readings from Last 3 Encounters:  10/01/23 124 lb (56.2 kg)  09/13/23 118 lb (53.5 kg)  09/06/23 118 lb 3.2 oz (53.6 kg)    GEN: Well nourished, well developed in no acute distress NECK: No JVD; No carotid bruits CARDIAC: RRR, no murmurs, rubs, gallops RESPIRATORY:  Diffusely coarse,fibrotic sounding without wheezing or rhonchi.  ABDOMEN: Soft, non-tender, non-distended EXTREMITIES:  No edema; No acute deformity   Asessement and Plan:.    1. Chest pain of uncertain etiology, mild CAD - mixed features over the past 2.5 years. She previously had some possible improvement with colchicine for empiric treatment of pericarditis by notes but then had continued symptoms prompting cath in 03/2021 that did not show any large vessel obstruction. Given abnormal EKG, tobacco abuse history, and description of findings, I think coronary vasospasm or microvascular angina is in the differential. Symptoms are not entirely c/w pericarditis. She did not tolerate Bystolic due to feeling like she was going to pass out, I suspect in the setting of her baseline soft BP. We will formally discontinue this. She is unlikely to tolerate nitrates given prior headache response with SL NTG and h/o migraines. She did not yet start Ranexa as she was unaware she was supposed to. I discussed with Dr. Elease Hashimoto, especially since she is likely going to be pending surgical lung biopsy for her progressive ILD of unclear etiology. It is unclear  whether this is contributing or provoking her discomfort. We would recommend she trial Ranexa 500mg  BID to see if this helps. Initiation of this medication typically warrants nurse visit for repeat surveillance EKG in 1-2 weeks to ensure QTc stable. Given that this is the same symptom that prompted reassuring cath in 2022, we feel that progression of CAD seems less likely given the lack of exertional angina and infrequency of episodes. We will hold off updated ischemic testing as she is not a great candidate for the treadmill given her ILD and her wheezing has previously made use of Lexiscan less ideal. We feel in light of the chronicity of her symptoms, reassuring cath 2022, she may proceed with lung biopsy if surgical clearance is requested of our team. Update CBC, CMET, TSH, lipids today.  2. H/o MVP - no significant murmur on exam, but we will update echocardiogram for completeness given chronicity of symptoms above. This does not necessarily have to be completed prior to her biopsy.  3. Hyperlipidemia - ezetimibe added to rosuvastatin last visit. She has not yet eaten today. Update CMET/lipid panel today.  4. Carotid artery disease - reassuring duplex 07/2023 with only mild disease. Consider repeat study 2-3 years or sooner if clinically indicated.    Disposition: F/u with me or Perlie Gold PA-C in 6 weeks. Nurse visit 1-2 weeks for EKG/QTc assessment given Ranexa initiation.  Signed, Laurann Montana, PA-C

## 2023-10-01 ENCOUNTER — Ambulatory Visit: Payer: Medicaid Other | Attending: Physician Assistant | Admitting: Physician Assistant

## 2023-10-01 ENCOUNTER — Encounter: Payer: Self-pay | Admitting: Physician Assistant

## 2023-10-01 ENCOUNTER — Other Ambulatory Visit: Payer: Self-pay | Admitting: Family

## 2023-10-01 ENCOUNTER — Other Ambulatory Visit: Payer: Self-pay | Admitting: Orthopaedic Surgery

## 2023-10-01 VITALS — BP 104/58 | HR 80 | Ht 60.0 in | Wt 124.0 lb

## 2023-10-01 DIAGNOSIS — R079 Chest pain, unspecified: Secondary | ICD-10-CM

## 2023-10-01 DIAGNOSIS — E785 Hyperlipidemia, unspecified: Secondary | ICD-10-CM

## 2023-10-01 DIAGNOSIS — I341 Nonrheumatic mitral (valve) prolapse: Secondary | ICD-10-CM | POA: Diagnosis not present

## 2023-10-01 DIAGNOSIS — I6523 Occlusion and stenosis of bilateral carotid arteries: Secondary | ICD-10-CM

## 2023-10-01 DIAGNOSIS — I2510 Atherosclerotic heart disease of native coronary artery without angina pectoris: Secondary | ICD-10-CM | POA: Diagnosis not present

## 2023-10-01 MED ORDER — TRAMADOL HCL 50 MG PO TABS: 50.0000 mg | ORAL_TABLET | Freq: Every day | ORAL | 0 refills | Status: DC | PRN

## 2023-10-01 NOTE — Patient Instructions (Addendum)
Medication Instructions:  START Ranexa 500mg  Take 1 tablet twice a day  STOP Bystolic (Nebivolol) *If you need a refill on your cardiac medications before your next appointment, please call your pharmacy*   Lab Work: TODAY-CMET, CBC, TSH, LIPIDS If you have labs (blood work) drawn today and your tests are completely normal, you will receive your results only by: MyChart Message (if you have MyChart) OR A paper copy in the mail If you have any lab test that is abnormal or we need to change your treatment, we will call you to review the results.   Testing/Procedures: Your physician has requested that you have an echocardiogram. Echocardiography is a painless test that uses sound waves to create images of your heart. It provides your doctor with information about the size and shape of your heart and how well your heart's chambers and valves are working. This procedure takes approximately one hour. There are no restrictions for this procedure. Please do NOT wear cologne, perfume, aftershave, or lotions (deodorant is allowed). Please arrive 15 minutes prior to your appointment time.  Please note: We ask at that you not bring children with you during ultrasound (echo/ vascular) testing. Due to room size and safety concerns, children are not allowed in the ultrasound rooms during exams. Our front office staff cannot provide observation of children in our lobby area while testing is being conducted. An adult accompanying a patient to their appointment will only be allowed in the ultrasound room at the discretion of the ultrasound technician under special circumstances. We apologize for any inconvenience.   Follow-Up: At [facility], you and your health needs are our priority.  As part of our continuing mission to provide you with exceptional heart care, we have created designated Provider Care Teams.  These Care Teams include your primary Cardiologist (physician) and Advanced Practice  Providers (APPs -  Physician Assistants and Nurse Practitioners) who all work together to provide you with the care you need, when you need it.  We recommend signing up for the patient portal called "MyChart".  Sign up information is provided on this After Visit Summary.  MyChart is used to connect with patients for Virtual Visits (Telemedicine).  Patients are able to view lab/test results, encounter notes, upcoming appointments, etc.  Non-urgent messages can be sent to your provider as well.   To learn more about what you can do with MyChart, go to [email or web address].    Your next appointment:   6 week(s)  Provider:   Ronie Spies, PA-C or Perlie Gold, PA-C      NURSE VISIT IN 1-2 WEEKS FOR EKG (STARTING RANEXA) Other Instructions

## 2023-10-02 DIAGNOSIS — R32 Unspecified urinary incontinence: Secondary | ICD-10-CM | POA: Diagnosis not present

## 2023-10-02 DIAGNOSIS — J449 Chronic obstructive pulmonary disease, unspecified: Secondary | ICD-10-CM | POA: Diagnosis not present

## 2023-10-02 LAB — COMPREHENSIVE METABOLIC PANEL
ALT: 27 [IU]/L (ref 0–32)
AST: 36 [IU]/L (ref 0–40)
Albumin: 4.6 g/dL (ref 3.8–4.9)
Alkaline Phosphatase: 85 [IU]/L (ref 44–121)
BUN/Creatinine Ratio: 13 (ref 9–23)
BUN: 11 mg/dL (ref 6–24)
Bilirubin Total: 0.3 mg/dL (ref 0.0–1.2)
CO2: 26 mmol/L (ref 20–29)
Calcium: 10.2 mg/dL (ref 8.7–10.2)
Chloride: 99 mmol/L (ref 96–106)
Creatinine, Ser: 0.87 mg/dL (ref 0.57–1.00)
Globulin, Total: 2.3 g/dL (ref 1.5–4.5)
Glucose: 95 mg/dL (ref 70–99)
Potassium: 4.4 mmol/L (ref 3.5–5.2)
Sodium: 141 mmol/L (ref 134–144)
Total Protein: 6.9 g/dL (ref 6.0–8.5)
eGFR: 78 mL/min/{1.73_m2} (ref >59)

## 2023-10-02 LAB — CBC
Hematocrit: 41.3 % (ref 34.0–46.6)
Hemoglobin: 13.3 g/dL (ref 11.1–15.9)
MCH: 29 pg (ref 26.6–33.0)
MCHC: 32.2 g/dL (ref 31.5–35.7)
MCV: 90 fL (ref 79–97)
Platelets: 309 10*3/uL (ref 150–450)
RBC: 4.58 x10E6/uL (ref 3.77–5.28)
RDW: 14.5 % (ref 11.7–15.4)
WBC: 8.3 10*3/uL (ref 3.4–10.8)

## 2023-10-02 LAB — LIPID PANEL
Chol/HDL Ratio: 2.2 {ratio} (ref 0.0–4.4)
Cholesterol, Total: 170 mg/dL (ref 100–199)
HDL: 76 mg/dL (ref >39)
LDL Chol Calc (NIH): 82 mg/dL (ref 0–99)
Triglycerides: 59 mg/dL (ref 0–149)
VLDL Cholesterol Cal: 12 mg/dL (ref 5–40)

## 2023-10-02 LAB — TSH: TSH: 2.91 u[IU]/mL (ref 0.450–4.500)

## 2023-10-02 NOTE — Telephone Encounter (Signed)
Noted  

## 2023-10-02 NOTE — Telephone Encounter (Signed)
Sent message to patient -   Thank you for your refill request for BUSPIRONE HCL 15 MG tablets. As you are being managed by a psychiatrist for your medication, refill requests for psychiatric medications need to be directed to your psychiatrist's office.  Please contact their office directly to request the refill, and let us know if you need any further assistance.

## 2023-10-03 ENCOUNTER — Other Ambulatory Visit: Payer: Self-pay | Admitting: Family

## 2023-10-03 ENCOUNTER — Ambulatory Visit: Payer: Medicaid Other | Admitting: Physical Therapy

## 2023-10-03 DIAGNOSIS — F32A Depression, unspecified: Secondary | ICD-10-CM

## 2023-10-03 MED ORDER — BUSPIRONE HCL 15 MG PO TABS: 15.0000 mg | ORAL_TABLET | Freq: Two times a day (BID) | ORAL | 2 refills | Status: DC | PRN

## 2023-10-03 NOTE — Telephone Encounter (Signed)
-   Buspirone prescribed.  - Referral to Psychiatry. Expect call soon with appointment details.

## 2023-10-05 ENCOUNTER — Institutional Professional Consult (permissible substitution): Payer: Medicaid Other | Admitting: Thoracic Surgery (Cardiothoracic Vascular Surgery)

## 2023-10-05 ENCOUNTER — Other Ambulatory Visit: Payer: Self-pay | Admitting: *Deleted

## 2023-10-05 VITALS — BP 97/63 | HR 90 | Resp 18 | Ht 60.0 in | Wt 122.0 lb

## 2023-10-05 DIAGNOSIS — J849 Interstitial pulmonary disease, unspecified: Secondary | ICD-10-CM | POA: Diagnosis not present

## 2023-10-05 NOTE — Progress Notes (Signed)
Surgical Instructions   Your procedure is scheduled on October 09, 2023. Report to [facility] Main Entrance "A" at 10:00 A.M., then check in with the Admitting office. Any questions or running late day of surgery: call [phone]  Questions prior to your surgery date: call [phone], Monday-Friday, 8am-4pm. If you experience any cold or flu symptoms such as cough, fever, chills, shortness of breath, etc. between now and your scheduled surgery, please notify us at the above number.     Remember:  Do not eat or drink after midnight the night before your surgery   Take these medicines the morning of surgery with A SIP OF WATER   ezetimibe (ZETIA)  gabapentin (NEURONTIN)  omeprazole (PRILOSEC)  ranolazine (RANEXA)  rosuvastatin (CRESTOR)   May take these medicines IF NEEDED:  albuterol (PROVENTIL)  busPIRone (BUSPAR)  nitroGLYCERIN (NITROSTAT)  ondansetron (ZOFRAN-ODT) SUMAtriptan (IMITREX)   traMADol (ULTRAM)  VENTOLIN HFA    Follow your surgeon's instructions on when to stop Asprin.  If no instructions were given by your surgeon then you will need to call the office to get those instructions.     One week prior to surgery, STOP taking any Aleve, Naproxen, Ibuprofen, Motrin, Advil, Goody's, BC's, all herbal medications, fish oil, and non-prescription vitamins.                     Do NOT Smoke (Tobacco/Vaping) for 24 hours prior to your procedure.  If you use a CPAP at night, you may bring your mask/headgear for your overnight stay.   You will be asked to remove any contacts, glasses, piercing's, hearing aid's, dentures/partials prior to surgery. Please bring cases for these items if needed.    Patients discharged the day of surgery will not be allowed to drive home, and someone needs to stay with them for 24 hours.  SURGICAL WAITING ROOM VISITATION Patients may have no more than 2 support people in the waiting area - these visitors may rotate.   Pre-op nurse will  coordinate an appropriate time for 1 ADULT support person, who may not rotate, to accompany patient in pre-op.  Children under the age of 71 must have an adult with them who is not the patient and must remain in the main waiting area with an adult.  If the patient needs to stay at the hospital during part of their recovery, the visitor guidelines for inpatient rooms apply.  Please refer to the [facility] website for the visitor guidelines for any additional information.   If you received a COVID test during your pre-op visit  it is requested that you wear a mask when out in public, stay away from anyone that may not be feeling well and notify your surgeon if you develop symptoms. If you have been in contact with anyone that has tested positive in the last 10 days please notify you surgeon.      Pre-operative CHG Bathing Instructions   You can play a key role in reducing the risk of infection after surgery. Your skin needs to be as free of germs as possible. You can reduce the number of germs on your skin by washing with CHG (chlorhexidine gluconate) soap before surgery. CHG is an antiseptic soap that kills germs and continues to kill germs even after washing.   DO NOT use if you have an allergy to chlorhexidine/CHG or antibacterial soaps. If your skin becomes reddened or irritated, stop using the CHG and notify one of our RNs at [phone].  TAKE A SHOWER THE NIGHT BEFORE SURGERY AND THE DAY OF SURGERY    Please keep in mind the following:  DO NOT shave, including legs and underarms, 48 hours prior to surgery.   You may shave your face before/day of surgery.  Place clean sheets on your bed the night before surgery Use a clean washcloth (not used since being washed) for each shower. DO NOT sleep with pet's night before surgery.  CHG Shower Instructions:  Wash your face and private area with normal soap. If you choose to wash your hair, wash first with your normal shampoo.   After you use shampoo/soap, rinse your hair and body thoroughly to remove shampoo/soap residue.  Turn the water OFF and apply half the bottle of CHG soap to a CLEAN washcloth.  Apply CHG soap ONLY FROM YOUR NECK DOWN TO YOUR TOES (washing for 3-5 minutes)  DO NOT use CHG soap on face, private areas, open wounds, or sores.  Pay special attention to the area where your surgery is being performed.  If you are having back surgery, having someone wash your back for you may be helpful. Wait 2 minutes after CHG soap is applied, then you may rinse off the CHG soap.  Pat dry with a clean towel  Put on clean pajamas    Additional instructions for the day of surgery: DO NOT APPLY any lotions, deodorants, cologne, or perfumes.   Do not wear jewelry or makeup Do not wear nail polish, gel polish, artificial nails, or any other type of covering on natural nails (fingers and toes) Do not bring valuables to the hospital. [facility] is not responsible for valuables/personal belongings. Put on clean/comfortable clothes.  Please brush your teeth.  Ask your nurse before applying any prescription medications to the skin.

## 2023-10-05 NOTE — Progress Notes (Unsigned)
[address]       [place] [address]             [phone]                    [name] [facility] Medical Record #[id number] Date of Birth: 20-Jul-1966  Referring: Kalman Shan, MD Primary Care: [name], NP Primary Cardiologist: Kristeen Miss, MD  Chief Complaint:    Chief Complaint  Patient presents with   Interstitial Lung Disease    History of Present Illness:    [name] 57 y.o. female presents for surgical evaluation of interstitial lung disease.  She smokes intermittently, but has not had any cigarettes since the beginning of November.  She has progressive shortness of breath with findings concerning for ILD of cross-sectional imaging.         Past Medical History:  Diagnosis Date   Allergy    Anemia    during pregnancies   Anxiety    Arthritis    hands and hip   Cancer (HCC)    CAP (community acquired pneumonia) 01/07/2020   Chronic kidney disease    KIDNEYSTONES   Cigarette smoker 11/05/2018   COPD (chronic obstructive pulmonary disease) (HCC)    Depression    Heart murmur    MITRAL VALVE PROLASP   High cholesterol    History of endometriosis    Incontinence    Low back pain radiating to both legs 07/25/2020   Menopausal symptoms    Menopause 01/09/2012   Migraine    Myocardial infarction (HCC)    silent 3 years ago   Night sweats 10/05/2019   Osteoporosis    Paresthesia of foot, bilateral 03/28/2019   Productive cough 12/25/2019   Renal stone 06/01/2020   Skin cancer    Thyroid disease    Tick bite 02/10/2019    Past Surgical History:  Procedure Laterality Date   ANKLE SURGERY     RIGHT   APPENDECTOMY     AUGMENTATION MAMMAPLASTY     BREAST ENHANCEMENT SURGERY     SALINE   BUNIONECTOMY     BILATERAL FEET   LEFT HEART CATH AND CORONARY ANGIOGRAPHY N/A 04/13/2021   Procedure: LEFT HEART CATH AND CORONARY ANGIOGRAPHY;  Surgeon: Marykay Lex, MD;  Location: [facility] INVASIVE CV LAB;  Service:  Cardiovascular;  Laterality: N/A;   TOOTH EXTRACTION N/A 07/11/2021   Procedure: DENTAL RESTORATION / EXTRACTIONS x 13;  Surgeon: Ocie Doyne, DMD;  Location: [facility];  Service: Oral Surgery;  Laterality: N/A;   TOTAL ABDOMINAL HYSTERECTOMY W/ BILATERAL SALPINGOOPHORECTOMY     TVT  10/30/2005   WISDOM TOOTH EXTRACTION     X 4    Family History  Problem Relation Age of Onset   Diabetes Mother    Heart disease Mother    Hyperlipidemia Mother    Colon polyps Mother    Tuberculosis Father    Alcohol abuse Father    Heart disease Sister    Hyperlipidemia Sister    Heart disease Brother    Hyperlipidemia Brother    Diabetes Maternal Grandmother    Diabetes Maternal Grandfather    Colon cancer Maternal Uncle    Stomach cancer Maternal Uncle      Social History   Tobacco Use  Smoking Status Former   Current packs/day: 1.00   Average packs/day: 1 pack/day for 34.0 years (34.0 ttl pk-yrs)   Types: Cigarettes  Passive exposure: Current  Smokeless Tobacco Never  Tobacco Comments   0.5-1ppd as of 07/03/23 Tay    Social History   Substance and Sexual Activity  Alcohol Use Yes   Comment: 1-2 times monthly     Allergies  Allergen Reactions   Dextromethorphan Other (See Comments)    keeps awake, legs constantly moving    Doxylamine Other (See Comments)    keeps awake, legs constantly moving    Pseudoeph-Doxylamine-Dm-Apap Other (See Comments)    REACTION: keeps awake, legs constantly moving  NYQUIL   Pseudoephedrine Hcl Other (See Comments)    keeps awake, legs constantly moving     Current Outpatient Medications  Medication Sig Dispense Refill   albuterol (PROVENTIL) (2.5 MG/3ML) 0.083% nebulizer solution INHALE 3 ML BY NEBULIZATION EVERY 6 HOURS AS NEEDED FOR WHEEZING OR SHORTNESS OF BREATH 75 mL 5   aspirin 81 MG chewable tablet Chew 81 mg by mouth daily.     Budeson-Glycopyrrol-Formoterol (BREZTRI AEROSPHERE) 160-9-4.8 MCG/ACT AERO Inhale 2  puffs into the lungs in the morning and at bedtime.     busPIRone (BUSPAR) 15 MG tablet Take 1 tablet (15 mg total) by mouth 2 (two) times daily as needed. (Patient taking differently: Take 15 mg by mouth 2 (two) times daily.) 60 tablet 2   ezetimibe (ZETIA) 10 MG tablet Take 1 tablet (10 mg total) by mouth daily. 90 tablet 3   gabapentin (NEURONTIN) 300 MG capsule TAKE 1 CAPSULE BY MOUTH TWICE A DAY 60 capsule 2   linaclotide (LINZESS) 145 MCG CAPS capsule Take 1 capsule (145 mcg total) by mouth daily before breakfast. (Patient taking differently: Take 145 mcg by mouth daily as needed (Constipation).) 30 capsule 3   nicotine (NICODERM CQ) 21 mg/24hr patch Place 1 patch (21 mg total) onto the skin daily. 28 patch 2   nitroGLYCERIN (NITROSTAT) 0.4 MG SL tablet Place 1 tablet (0.4 mg total) under the tongue every 5 (five) minutes as needed for chest pain. 25 tablet 1   omeprazole (PRILOSEC) 20 MG capsule TAKE 1 CAPSULE BY MOUTH EVERY DAY 90 capsule 1   ondansetron (ZOFRAN-ODT) 4 MG disintegrating tablet Take 1 tablet (4 mg total) by mouth every 8 (eight) hours as needed for nausea or vomiting. (Patient taking differently: Take 4 mg by mouth daily.) 30 tablet 2   ranolazine (RANEXA) 500 MG 12 hr tablet Take 1 tablet (500 mg total) by mouth 2 (two) times daily. 180 tablet 3   rosuvastatin (CRESTOR) 40 MG tablet TAKE 1 TABLET BY MOUTH EVERY DAY 90 tablet 0   SUMAtriptan (IMITREX) 6 MG/0.5ML SOLN injection Inject 0.5 mLs (6 mg total) into the skin every 2 (two) hours as needed for migraine or headache. May repeat in 2 hours if headache persists or recurs.  Limit to twice per week. 0.5 mL 8   traMADol (ULTRAM) 50 MG tablet Take 1-2 tablets (50-100 mg total) by mouth daily as needed. 20 tablet 0   traZODone (DESYREL) 50 MG tablet TAKE 1/2 TO 1 TABLETS BY MOUTH AT BEDTIME AS NEEDED FOR SLEEP. (Patient taking differently: Take 50 mg by mouth at bedtime.) 90 tablet 1   VENTOLIN HFA 108 (90 Base) MCG/ACT inhaler  INHALE 1-2 PUFFS BY MOUTH EVERY 6 HOURS AS NEEDED FOR WHEEZE OR SHORTNESS OF BREATH 18 each 2   Vitamin D, Ergocalciferol, (DRISDOL) 1.25 MG (50000 UNIT) CAPS capsule Take 1 capsule (50,000 Units total) by mouth once a week. (Patient not taking: Reported on 10/05/2023) 5 capsule 0  No current facility-administered medications for this visit.    Review of Systems  Constitutional:  Positive for malaise/fatigue.  Respiratory:  Positive for cough and shortness of breath.   Cardiovascular:  Negative for chest pain.     PHYSICAL EXAMINATION: BP 97/63 (BP Location: Left Arm, Patient Position: Sitting)   Pulse 90   Resp 18   Ht 5' (1.524 m)   Wt 122 lb (55.3 kg)   SpO2 (!) 84% Comment: RA  BMI 23.83 kg/m  Physical Exam Constitutional:      General: She is not in acute distress.    Appearance: Normal appearance. She is not ill-appearing.  HENT:     Head: Normocephalic and atraumatic.  Eyes:     Extraocular Movements: Extraocular movements intact.  Cardiovascular:     Rate and Rhythm: Normal rate.  Pulmonary:     Effort: Pulmonary effort is normal. No respiratory distress.  Abdominal:     General: Abdomen is flat. There is no distension.  Musculoskeletal:        General: Normal range of motion.  Skin:    General: Skin is warm and dry.  Neurological:     General: No focal deficit present.     Mental Status: She is alert and oriented to person, place, and time.          I have independently reviewed the above radiology studies  and reviewed the findings with the patient.   Recent Lab Findings: Lab Results  Component Value Date   WBC 9.7 10/08/2023   HGB 13.9 10/08/2023   HCT 43.2 10/08/2023   PLT 391 10/08/2023   GLUCOSE 104 (H) 10/08/2023   CHOL 170 10/01/2023   TRIG 59 10/01/2023   HDL 76 10/01/2023   LDLCALC 82 10/01/2023   ALT 22 10/08/2023   AST 36 10/08/2023   NA 139 10/08/2023   K 3.4 (L) 10/08/2023   CL 103 10/08/2023   CREATININE 1.13 (H) 10/08/2023    BUN <5 (L) 10/08/2023   CO2 24 10/08/2023   TSH 2.910 10/01/2023   INR 1.0 10/08/2023   HGBA1C 5.4 07/22/2018    Diagnostic Studies & Laboratory data:     Recent Radiology Findings:   No results found.   PFTs:  - FVC: 45% - FEV1: 48% -DLCO: 41%    Assessment / Plan:   57yo female with concern for interstitial lung disease.  She has very marginal lung function.  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  The risks and benefits of R RATS, wedge resection were discussed in detail.  The patient is agreeable to proceed.      I  spent 40 minutes with  the patient face to face in counseling and coordination of care.    Corliss Skains 10/08/2023 2:37 PM

## 2023-10-05 NOTE — H&P (View-Only) (Signed)
[address]       [place] [address]             [phone]                    [name] [facility] Medical Record #[id number] Date of Birth: 20-Jul-1966  Referring: Kalman Shan, MD Primary Care: [name], NP Primary Cardiologist: Kristeen Miss, MD  Chief Complaint:    Chief Complaint  Patient presents with   Interstitial Lung Disease    History of Present Illness:    [name] 57 y.o. female presents for surgical evaluation of interstitial lung disease.  She smokes intermittently, but has not had any cigarettes since the beginning of November.  She has progressive shortness of breath with findings concerning for ILD of cross-sectional imaging.         Past Medical History:  Diagnosis Date   Allergy    Anemia    during pregnancies   Anxiety    Arthritis    hands and hip   Cancer (HCC)    CAP (community acquired pneumonia) 01/07/2020   Chronic kidney disease    KIDNEYSTONES   Cigarette smoker 11/05/2018   COPD (chronic obstructive pulmonary disease) (HCC)    Depression    Heart murmur    MITRAL VALVE PROLASP   High cholesterol    History of endometriosis    Incontinence    Low back pain radiating to both legs 07/25/2020   Menopausal symptoms    Menopause 01/09/2012   Migraine    Myocardial infarction (HCC)    silent 3 years ago   Night sweats 10/05/2019   Osteoporosis    Paresthesia of foot, bilateral 03/28/2019   Productive cough 12/25/2019   Renal stone 06/01/2020   Skin cancer    Thyroid disease    Tick bite 02/10/2019    Past Surgical History:  Procedure Laterality Date   ANKLE SURGERY     RIGHT   APPENDECTOMY     AUGMENTATION MAMMAPLASTY     BREAST ENHANCEMENT SURGERY     SALINE   BUNIONECTOMY     BILATERAL FEET   LEFT HEART CATH AND CORONARY ANGIOGRAPHY N/A 04/13/2021   Procedure: LEFT HEART CATH AND CORONARY ANGIOGRAPHY;  Surgeon: Marykay Lex, MD;  Location: [facility] INVASIVE CV LAB;  Service:  Cardiovascular;  Laterality: N/A;   TOOTH EXTRACTION N/A 07/11/2021   Procedure: DENTAL RESTORATION / EXTRACTIONS x 13;  Surgeon: Ocie Doyne, DMD;  Location: [facility];  Service: Oral Surgery;  Laterality: N/A;   TOTAL ABDOMINAL HYSTERECTOMY W/ BILATERAL SALPINGOOPHORECTOMY     TVT  10/30/2005   WISDOM TOOTH EXTRACTION     X 4    Family History  Problem Relation Age of Onset   Diabetes Mother    Heart disease Mother    Hyperlipidemia Mother    Colon polyps Mother    Tuberculosis Father    Alcohol abuse Father    Heart disease Sister    Hyperlipidemia Sister    Heart disease Brother    Hyperlipidemia Brother    Diabetes Maternal Grandmother    Diabetes Maternal Grandfather    Colon cancer Maternal Uncle    Stomach cancer Maternal Uncle      Social History   Tobacco Use  Smoking Status Former   Current packs/day: 1.00   Average packs/day: 1 pack/day for 34.0 years (34.0 ttl pk-yrs)   Types: Cigarettes  Passive exposure: Current  Smokeless Tobacco Never  Tobacco Comments   0.5-1ppd as of 07/03/23 Tay    Social History   Substance and Sexual Activity  Alcohol Use Yes   Comment: 1-2 times monthly     Allergies  Allergen Reactions   Dextromethorphan Other (See Comments)    keeps awake, legs constantly moving    Doxylamine Other (See Comments)    keeps awake, legs constantly moving    Pseudoeph-Doxylamine-Dm-Apap Other (See Comments)    REACTION: keeps awake, legs constantly moving  NYQUIL   Pseudoephedrine Hcl Other (See Comments)    keeps awake, legs constantly moving     Current Outpatient Medications  Medication Sig Dispense Refill   albuterol (PROVENTIL) (2.5 MG/3ML) 0.083% nebulizer solution INHALE 3 ML BY NEBULIZATION EVERY 6 HOURS AS NEEDED FOR WHEEZING OR SHORTNESS OF BREATH 75 mL 5   aspirin 81 MG chewable tablet Chew 81 mg by mouth daily.     Budeson-Glycopyrrol-Formoterol (BREZTRI AEROSPHERE) 160-9-4.8 MCG/ACT AERO Inhale 2  puffs into the lungs in the morning and at bedtime.     busPIRone (BUSPAR) 15 MG tablet Take 1 tablet (15 mg total) by mouth 2 (two) times daily as needed. (Patient taking differently: Take 15 mg by mouth 2 (two) times daily.) 60 tablet 2   ezetimibe (ZETIA) 10 MG tablet Take 1 tablet (10 mg total) by mouth daily. 90 tablet 3   gabapentin (NEURONTIN) 300 MG capsule TAKE 1 CAPSULE BY MOUTH TWICE A DAY 60 capsule 2   linaclotide (LINZESS) 145 MCG CAPS capsule Take 1 capsule (145 mcg total) by mouth daily before breakfast. (Patient taking differently: Take 145 mcg by mouth daily as needed (Constipation).) 30 capsule 3   nicotine (NICODERM CQ) 21 mg/24hr patch Place 1 patch (21 mg total) onto the skin daily. 28 patch 2   nitroGLYCERIN (NITROSTAT) 0.4 MG SL tablet Place 1 tablet (0.4 mg total) under the tongue every 5 (five) minutes as needed for chest pain. 25 tablet 1   omeprazole (PRILOSEC) 20 MG capsule TAKE 1 CAPSULE BY MOUTH EVERY DAY 90 capsule 1   ondansetron (ZOFRAN-ODT) 4 MG disintegrating tablet Take 1 tablet (4 mg total) by mouth every 8 (eight) hours as needed for nausea or vomiting. (Patient taking differently: Take 4 mg by mouth daily.) 30 tablet 2   ranolazine (RANEXA) 500 MG 12 hr tablet Take 1 tablet (500 mg total) by mouth 2 (two) times daily. 180 tablet 3   rosuvastatin (CRESTOR) 40 MG tablet TAKE 1 TABLET BY MOUTH EVERY DAY 90 tablet 0   SUMAtriptan (IMITREX) 6 MG/0.5ML SOLN injection Inject 0.5 mLs (6 mg total) into the skin every 2 (two) hours as needed for migraine or headache. May repeat in 2 hours if headache persists or recurs.  Limit to twice per week. 0.5 mL 8   traMADol (ULTRAM) 50 MG tablet Take 1-2 tablets (50-100 mg total) by mouth daily as needed. 20 tablet 0   traZODone (DESYREL) 50 MG tablet TAKE 1/2 TO 1 TABLETS BY MOUTH AT BEDTIME AS NEEDED FOR SLEEP. (Patient taking differently: Take 50 mg by mouth at bedtime.) 90 tablet 1   VENTOLIN HFA 108 (90 Base) MCG/ACT inhaler  INHALE 1-2 PUFFS BY MOUTH EVERY 6 HOURS AS NEEDED FOR WHEEZE OR SHORTNESS OF BREATH 18 each 2   Vitamin D, Ergocalciferol, (DRISDOL) 1.25 MG (50000 UNIT) CAPS capsule Take 1 capsule (50,000 Units total) by mouth once a week. (Patient not taking: Reported on 10/05/2023) 5 capsule 0  No current facility-administered medications for this visit.    Review of Systems  Constitutional:  Positive for malaise/fatigue.  Respiratory:  Positive for cough and shortness of breath.   Cardiovascular:  Negative for chest pain.     PHYSICAL EXAMINATION: BP 97/63 (BP Location: Left Arm, Patient Position: Sitting)   Pulse 90   Resp 18   Ht 5' (1.524 m)   Wt 122 lb (55.3 kg)   SpO2 (!) 84% Comment: RA  BMI 23.83 kg/m  Physical Exam Constitutional:      General: She is not in acute distress.    Appearance: Normal appearance. She is not ill-appearing.  HENT:     Head: Normocephalic and atraumatic.  Eyes:     Extraocular Movements: Extraocular movements intact.  Cardiovascular:     Rate and Rhythm: Normal rate.  Pulmonary:     Effort: Pulmonary effort is normal. No respiratory distress.  Abdominal:     General: Abdomen is flat. There is no distension.  Musculoskeletal:        General: Normal range of motion.  Skin:    General: Skin is warm and dry.  Neurological:     General: No focal deficit present.     Mental Status: She is alert and oriented to person, place, and time.          I have independently reviewed the above radiology studies  and reviewed the findings with the patient.   Recent Lab Findings: Lab Results  Component Value Date   WBC 9.7 10/08/2023   HGB 13.9 10/08/2023   HCT 43.2 10/08/2023   PLT 391 10/08/2023   GLUCOSE 104 (H) 10/08/2023   CHOL 170 10/01/2023   TRIG 59 10/01/2023   HDL 76 10/01/2023   LDLCALC 82 10/01/2023   ALT 22 10/08/2023   AST 36 10/08/2023   NA 139 10/08/2023   K 3.4 (L) 10/08/2023   CL 103 10/08/2023   CREATININE 1.13 (H) 10/08/2023    BUN <5 (L) 10/08/2023   CO2 24 10/08/2023   TSH 2.910 10/01/2023   INR 1.0 10/08/2023   HGBA1C 5.4 07/22/2018    Diagnostic Studies & Laboratory data:     Recent Radiology Findings:   No results found.   PFTs:  - FVC: 45% - FEV1: 48% -DLCO: 41%    Assessment / Plan:   57yo female with concern for interstitial lung disease.  She has very marginal lung function.  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  The risks and benefits of R RATS, wedge resection were discussed in detail.  The patient is agreeable to proceed.      I  spent 40 minutes with  the patient face to face in counseling and coordination of care.    Corliss Skains 10/08/2023 2:37 PM

## 2023-10-08 ENCOUNTER — Other Ambulatory Visit: Payer: Self-pay

## 2023-10-08 ENCOUNTER — Ambulatory Visit (HOSPITAL_COMMUNITY)
Admission: RE | Admit: 2023-10-08 | Discharge: 2023-10-08 | Disposition: A | Discharge status: Home / Self Care | Payer: Medicaid Other | Source: Ambulatory Visit | Attending: Thoracic Surgery (Cardiothoracic Vascular Surgery)

## 2023-10-08 ENCOUNTER — Encounter (HOSPITAL_COMMUNITY): Payer: Self-pay

## 2023-10-08 ENCOUNTER — Encounter (HOSPITAL_COMMUNITY)
Admission: RE | Admit: 2023-10-08 | Discharge: 2023-10-08 | Disposition: A | Discharge status: Home / Self Care | Payer: Medicaid Other | Source: Ambulatory Visit | Attending: Thoracic Surgery (Cardiothoracic Vascular Surgery) | Admitting: Thoracic Surgery (Cardiothoracic Vascular Surgery)

## 2023-10-08 VITALS — BP 93/66 | HR 86 | Temp 98.1°F | Resp 18 | Ht 60.0 in | Wt 118.2 lb

## 2023-10-08 DIAGNOSIS — R079 Chest pain, unspecified: Secondary | ICD-10-CM | POA: Diagnosis not present

## 2023-10-08 DIAGNOSIS — Z01818 Encounter for other preprocedural examination: Secondary | ICD-10-CM | POA: Insufficient documentation

## 2023-10-08 DIAGNOSIS — R918 Other nonspecific abnormal finding of lung field: Secondary | ICD-10-CM | POA: Diagnosis not present

## 2023-10-08 DIAGNOSIS — Z1152 Encounter for screening for COVID-19: Secondary | ICD-10-CM | POA: Insufficient documentation

## 2023-10-08 DIAGNOSIS — I2510 Atherosclerotic heart disease of native coronary artery without angina pectoris: Secondary | ICD-10-CM | POA: Diagnosis not present

## 2023-10-08 DIAGNOSIS — J849 Interstitial pulmonary disease, unspecified: Secondary | ICD-10-CM | POA: Insufficient documentation

## 2023-10-08 LAB — URINALYSIS, ROUTINE W REFLEX MICROSCOPIC
Bilirubin Urine: NEGATIVE
Glucose, UA: NEGATIVE mg/dL
Hgb urine dipstick: NEGATIVE
Ketones, ur: NEGATIVE mg/dL
Leukocytes,Ua: NEGATIVE
Nitrite: NEGATIVE
Protein, ur: 30 mg/dL — AB
Specific Gravity, Urine: 1.006 (ref 1.005–1.030)
pH: 7 (ref 5.0–8.0)

## 2023-10-08 LAB — COMPREHENSIVE METABOLIC PANEL
ALT: 22 U/L (ref 0–44)
AST: 36 U/L (ref 15–41)
Albumin: 4 g/dL (ref 3.5–5.0)
Alkaline Phosphatase: 81 U/L (ref 38–126)
Anion gap: 12 (ref 5–15)
BUN: <5 mg/dL — ABNORMAL LOW (ref 6–20)
CO2: 24 mmol/L (ref 22–32)
Calcium: 10.2 mg/dL (ref 8.9–10.3)
Chloride: 103 mmol/L (ref 98–111)
Creatinine, Ser: 1.13 mg/dL — ABNORMAL HIGH (ref 0.44–1.00)
GFR, Estimated: 57 mL/min — ABNORMAL LOW (ref >60)
Glucose, Bld: 104 mg/dL — ABNORMAL HIGH (ref 70–99)
Potassium: 3.4 mmol/L — ABNORMAL LOW (ref 3.5–5.1)
Sodium: 139 mmol/L (ref 135–145)
Total Bilirubin: 0.6 mg/dL (ref <1.2)
Total Protein: 7.7 g/dL (ref 6.5–8.1)

## 2023-10-08 LAB — TYPE AND SCREEN
ABO/RH(D): O POS
Antibody Screen: NEGATIVE

## 2023-10-08 LAB — PROTIME-INR
INR: 1 (ref 0.8–1.2)
Prothrombin Time: 13.1 s (ref 11.4–15.2)

## 2023-10-08 LAB — CBC
HCT: 43.2 % (ref 36.0–46.0)
Hemoglobin: 13.9 g/dL (ref 12.0–15.0)
MCH: 28.7 pg (ref 26.0–34.0)
MCHC: 32.2 g/dL (ref 30.0–36.0)
MCV: 89.3 fL (ref 80.0–100.0)
Platelets: 391 10*3/uL (ref 150–400)
RBC: 4.84 MIL/uL (ref 3.87–5.11)
RDW: 15.3 % (ref 11.5–15.5)
WBC: 9.7 10*3/uL (ref 4.0–10.5)
nRBC: 0 % (ref 0.0–0.2)

## 2023-10-08 LAB — APTT: aPTT: 37 s — ABNORMAL HIGH (ref 24–36)

## 2023-10-08 LAB — SURGICAL PCR SCREEN
MRSA, PCR: NEGATIVE
Staphylococcus aureus: NEGATIVE

## 2023-10-08 NOTE — Anesthesia Preprocedure Evaluation (Signed)
Anesthesia Evaluation  Patient identified by MRN, date of birth, ID band Patient awake    Reviewed: Allergy & Precautions, NPO status , Patient's Chart, lab work & pertinent test results, reviewed documented beta blocker date and time   History of Anesthesia Complications Negative for: history of anesthetic complications  Airway Mallampati: II  TM Distance: >3 FB     Dental  (+) Edentulous Upper, Edentulous Lower   Pulmonary neg shortness of breath, pneumonia, resolved, COPD, neg recent URI, former smoker   + rhonchi  + decreased breath sounds      Cardiovascular Exercise Tolerance: Poor (-) hypertension+ angina  + Past MI  (-) Cardiac Stents and (-) CABG + Valvular Problems/Murmurs  Rhythm:Regular Rate:Normal     Neuro/Psych  Headaches, neg Seizures PSYCHIATRIC DISORDERS Anxiety Depression     Neuromuscular disease    GI/Hepatic ,,,(+) neg Cirrhosis        Endo/Other    Renal/GU Renal disease     Musculoskeletal  (+) Arthritis ,    Abdominal   Peds  Hematology  (+) Blood dyscrasia, anemia   Anesthesia Other Findings   Reproductive/Obstetrics                              Anesthesia Physical Anesthesia Plan  ASA: 3  Anesthesia Plan: General   Post-op Pain Management:    Induction: Intravenous  PONV Risk Score and Plan: 2 and Ondansetron and Dexamethasone  Airway Management Planned: Double Lumen EBT  Additional Equipment: Arterial line  Intra-op Plan:   Post-operative Plan: Extubation in OR  Informed Consent: I have reviewed the patients History and Physical, chart, labs and discussed the procedure including the risks, benefits and alternatives for the proposed anesthesia with the patient or authorized representative who has indicated his/her understanding and acceptance.     Dental advisory given  Plan Discussed with: CRNA  Anesthesia Plan Comments: (PAT note by  Antionette Poles, PA-C:  57 year old female follows with cardiology for history of chronic chest pain of unclear etiology.  Last seen by Ronie Spies, PA-C on 10/01/2023.  Per note, "I discussed with Dr. Elease Hashimoto, especially since she is likely going to be pending surgical lung biopsy for her progressive ILD of unclear etiology. It is unclear whether this is contributing or provoking her discomfort. We would recommend she trial Ranexa 500mg  BID to see if this helps. Initiation of this medication typically warrants nurse visit for repeat surveillance EKG in 1-2 weeks to ensure QTc stable. Given that this is the same symptom that prompted reassuring cath in 2022, we feel that progression of CAD seems less likely given the lack of exertional angina and infrequency of episodes. We will hold off updated ischemic testing as she is not a great candidate for the treadmill given her ILD and her wheezing has previously made use of Lexiscan less ideal. We feel in light of the chronicity of her symptoms, reassuring cath 2022, she may proceed with lung biopsy if surgical clearance is requested of our team."  Patient is being evaluated by Dr. Cliffton Asters for progressive shortness of breath with findings concerning for ILD on cross-sectional imaging..  She has marginal lung function with FVC 45%, FEV1 48%, DLCO 41%.  Labs reviewed, unremarkable.  EKG 10/01/23: Normal sinus rhythm.  Rate 80. Rightward axis. Incomplete right bundle branch block. Nonspecific TW inversions II, III, avF, V3-V6  TTE 05/09/2021: 1. Left ventricular ejection fraction, by estimation, is 60 to 65%.  The  left ventricle has normal function. The left ventricle has no regional  wall motion abnormalities. Left ventricular diastolic parameters were  normal.   2. Right ventricular systolic function is normal. The right ventricular  size is normal. There is normal pulmonary artery systolic pressure.   3. Mild bileaflet prolapse and myxomatious changes . The  mitral valve is  myxomatous. Trivial mitral valve regurgitation. No evidence of mitral  stenosis.   4. The aortic valve is normal in structure. Aortic valve regurgitation is  not visualized. No aortic stenosis is present.   5. The inferior vena cava is normal in size with greater than 50%  respiratory variability, suggesting right atrial pressure of 3 mmHg.   Cath 04/13/2021:  Ost LAD to Prox LAD lesion is 20% stenosed.  Otherwise minimal luminal irregularities  The left ventricular systolic function is normal.  LV end diastolic pressure is normal.  The left ventricular ejection fraction is 55-65% by visual estimate.   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  Otherwise minimal CAD of the LCx or RCA  Normal LVEF & LVEDP     RECOMMENDATION  Consider Microvascular Disease as cause of symptoms concerning for Angina  o Did not start Ranexa due to potential medication interaction  Follow-up with Primary Cardiologist    )         Anesthesia Quick Evaluation

## 2023-10-08 NOTE — Progress Notes (Signed)
Anesthesia Chart Review:  57 year old female follows with cardiology for history of chronic chest pain of unclear etiology.  Last seen by Ronie Spies, PA-C on 10/01/2023.  Per note, "I discussed with Dr. Elease Hashimoto, especially since she is likely going to be pending surgical lung biopsy for her progressive ILD of unclear etiology. It is unclear whether this is contributing or provoking her discomfort. We would recommend she trial Ranexa 500mg  BID to see if this helps. Initiation of this medication typically warrants nurse visit for repeat surveillance EKG in 1-2 weeks to ensure QTc stable. Given that this is the same symptom that prompted reassuring cath in 2022, we feel that progression of CAD seems less likely given the lack of exertional angina and infrequency of episodes. We will hold off updated ischemic testing as she is not a great candidate for the treadmill given her ILD and her wheezing has previously made use of Lexiscan less ideal. We feel in light of the chronicity of her symptoms, reassuring cath 2022, she may proceed with lung biopsy if surgical clearance is requested of our team."  Patient is being evaluated by Dr. Cliffton Asters for progressive shortness of breath with findings concerning for ILD on cross-sectional imaging..  She has marginal lung function with FVC 45%, FEV1 48%, DLCO 41%.  Labs reviewed, unremarkable.  EKG 10/01/23: Normal sinus rhythm.  Rate 80. Rightward axis. Incomplete right bundle branch block. Nonspecific TW inversions II, III, avF, V3-V6  TTE 05/09/2021: 1. Left ventricular ejection fraction, by estimation, is 60 to 65%. The  left ventricle has normal function. The left ventricle has no regional  wall motion abnormalities. Left ventricular diastolic parameters were  normal.   2. Right ventricular systolic function is normal. The right ventricular  size is normal. There is normal pulmonary artery systolic pressure.   3. Mild bileaflet prolapse and myxomatious changes .  The mitral valve is  myxomatous. Trivial mitral valve regurgitation. No evidence of mitral  stenosis.   4. The aortic valve is normal in structure. Aortic valve regurgitation is  not visualized. No aortic stenosis is present.   5. The inferior vena cava is normal in size with greater than 50%  respiratory variability, suggesting right atrial pressure of 3 mmHg.   Cath 04/13/2021: Ost LAD to Prox LAD lesion is 20% stenosed. Otherwise minimal luminal irregularities The left ventricular systolic function is normal. LV end diastolic pressure is normal. The left ventricular ejection fraction is 55-65% by visual estimate.   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 Otherwise minimal CAD of the LCx or RCA Normal LVEF & LVEDP     RECOMMENDATION Consider Microvascular Disease as cause of symptoms concerning for Angina Did not start Ranexa due to potential medication interaction Follow-up with Primary Cardiologist    Antionette Poles, PA-C [facility] Short Stay Center/Anesthesiology Phone [phone] 10/08/2023 3:38 PM

## 2023-10-08 NOTE — Progress Notes (Signed)
R. Brooks at Dr Cliffton Asters made aware of urine results

## 2023-10-08 NOTE — Progress Notes (Signed)
PCP - [name], NP  Cardiologist -  Kristeen Miss, MD   PPM/ICD - denies Device Orders - n/a Rep Notified - n/a  Chest x-ray - 10-08-23 EKG - 10-01-23 Stress Test -  ECHO - 05-09-21 Cardiac Cath - 04-13-21  Sleep Study - denies CPAP - n/a  DM - denies  Blood Thinner Instructions: Aspirin Instructions: instructed to check with surgeon regarding asa instructions  ERAS Protcol - NPO   COVID TEST- yes   Anesthesia review: Yes COPD, MI, Heart murmur  Patient denies shortness of breath, fever, cough and chest pain at PAT appointment   All instructions explained to the patient, with a verbal understanding of the material. Patient agrees to go over the instructions while at home for a better understanding. Patient also instructed to self quarantine after being tested for COVID-19. The opportunity to ask questions was provided.

## 2023-10-09 ENCOUNTER — Inpatient Hospital Stay (HOSPITAL_COMMUNITY): Payer: Medicaid Other

## 2023-10-09 ENCOUNTER — Other Ambulatory Visit: Payer: Self-pay

## 2023-10-09 ENCOUNTER — Inpatient Hospital Stay (HOSPITAL_COMMUNITY)
Admission: RE | Admit: 2023-10-09 | Discharge: 2023-10-11 | DRG: 164 | Disposition: A | Discharge status: Home / Self Care | Payer: Medicaid Other | Attending: Thoracic Surgery (Cardiothoracic Vascular Surgery) | Admitting: Thoracic Surgery (Cardiothoracic Vascular Surgery)

## 2023-10-09 ENCOUNTER — Encounter (HOSPITAL_COMMUNITY): Payer: Self-pay | Admitting: Thoracic Surgery (Cardiothoracic Vascular Surgery)

## 2023-10-09 ENCOUNTER — Inpatient Hospital Stay (HOSPITAL_COMMUNITY): Payer: Self-pay | Admitting: Certified Registered Nurse Anesthetist

## 2023-10-09 ENCOUNTER — Encounter (HOSPITAL_COMMUNITY)
Admission: RE | Disposition: A | Discharge status: Home / Self Care | Payer: Self-pay | Source: Home / Self Care | Attending: Thoracic Surgery (Cardiothoracic Vascular Surgery)

## 2023-10-09 DIAGNOSIS — R110 Nausea: Secondary | ICD-10-CM

## 2023-10-09 DIAGNOSIS — J95812 Postprocedural air leak: Secondary | ICD-10-CM | POA: Diagnosis not present

## 2023-10-09 DIAGNOSIS — R911 Solitary pulmonary nodule: Secondary | ICD-10-CM

## 2023-10-09 DIAGNOSIS — J849 Interstitial pulmonary disease, unspecified: Principal | ICD-10-CM | POA: Diagnosis present

## 2023-10-09 DIAGNOSIS — Z4682 Encounter for fitting and adjustment of non-vascular catheter: Secondary | ICD-10-CM | POA: Diagnosis not present

## 2023-10-09 DIAGNOSIS — Z90710 Acquired absence of both cervix and uterus: Secondary | ICD-10-CM

## 2023-10-09 DIAGNOSIS — I341 Nonrheumatic mitral (valve) prolapse: Secondary | ICD-10-CM | POA: Diagnosis not present

## 2023-10-09 DIAGNOSIS — F17210 Nicotine dependence, cigarettes, uncomplicated: Secondary | ICD-10-CM | POA: Diagnosis present

## 2023-10-09 DIAGNOSIS — Z8249 Family history of ischemic heart disease and other diseases of the circulatory system: Secondary | ICD-10-CM | POA: Diagnosis not present

## 2023-10-09 DIAGNOSIS — F329 Major depressive disorder, single episode, unspecified: Secondary | ICD-10-CM | POA: Diagnosis not present

## 2023-10-09 DIAGNOSIS — Z888 Allergy status to other drugs, medicaments and biological substances status: Secondary | ICD-10-CM

## 2023-10-09 DIAGNOSIS — Z79899 Other long term (current) drug therapy: Secondary | ICD-10-CM

## 2023-10-09 DIAGNOSIS — Z800 Family history of malignant neoplasm of digestive organs: Secondary | ICD-10-CM

## 2023-10-09 DIAGNOSIS — J449 Chronic obstructive pulmonary disease, unspecified: Secondary | ICD-10-CM | POA: Diagnosis not present

## 2023-10-09 DIAGNOSIS — Z7982 Long term (current) use of aspirin: Secondary | ICD-10-CM | POA: Diagnosis not present

## 2023-10-09 DIAGNOSIS — Z831 Family history of other infectious and parasitic diseases: Secondary | ICD-10-CM

## 2023-10-09 DIAGNOSIS — Z83438 Family history of other disorder of lipoprotein metabolism and other lipidemia: Secondary | ICD-10-CM | POA: Diagnosis not present

## 2023-10-09 DIAGNOSIS — R32 Unspecified urinary incontinence: Secondary | ICD-10-CM | POA: Diagnosis present

## 2023-10-09 DIAGNOSIS — Z85828 Personal history of other malignant neoplasm of skin: Secondary | ICD-10-CM

## 2023-10-09 DIAGNOSIS — E7800 Pure hypercholesterolemia, unspecified: Secondary | ICD-10-CM | POA: Diagnosis not present

## 2023-10-09 DIAGNOSIS — J939 Pneumothorax, unspecified: Secondary | ICD-10-CM | POA: Diagnosis not present

## 2023-10-09 DIAGNOSIS — M810 Age-related osteoporosis without current pathological fracture: Secondary | ICD-10-CM | POA: Diagnosis present

## 2023-10-09 DIAGNOSIS — Z48813 Encounter for surgical aftercare following surgery on the respiratory system: Secondary | ICD-10-CM | POA: Diagnosis not present

## 2023-10-09 DIAGNOSIS — F419 Anxiety disorder, unspecified: Secondary | ICD-10-CM | POA: Diagnosis not present

## 2023-10-09 DIAGNOSIS — I252 Old myocardial infarction: Secondary | ICD-10-CM | POA: Diagnosis not present

## 2023-10-09 DIAGNOSIS — Z87442 Personal history of urinary calculi: Secondary | ICD-10-CM

## 2023-10-09 DIAGNOSIS — J984 Other disorders of lung: Secondary | ICD-10-CM | POA: Diagnosis not present

## 2023-10-09 DIAGNOSIS — E079 Disorder of thyroid, unspecified: Secondary | ICD-10-CM | POA: Diagnosis present

## 2023-10-09 DIAGNOSIS — J9811 Atelectasis: Secondary | ICD-10-CM | POA: Diagnosis not present

## 2023-10-09 DIAGNOSIS — Z811 Family history of alcohol abuse and dependence: Secondary | ICD-10-CM

## 2023-10-09 DIAGNOSIS — J8410 Pulmonary fibrosis, unspecified: Secondary | ICD-10-CM | POA: Diagnosis not present

## 2023-10-09 DIAGNOSIS — Z83719 Family history of colon polyps, unspecified: Secondary | ICD-10-CM

## 2023-10-09 DIAGNOSIS — Z833 Family history of diabetes mellitus: Secondary | ICD-10-CM | POA: Diagnosis not present

## 2023-10-09 DIAGNOSIS — G4700 Insomnia, unspecified: Secondary | ICD-10-CM

## 2023-10-09 HISTORY — PX: INTERCOSTAL NERVE BLOCK: SHX5021

## 2023-10-09 LAB — ABO/RH: ABO/RH(D): O POS

## 2023-10-09 LAB — SARS CORONAVIRUS 2 (TAT 6-24 HRS): SARS Coronavirus 2: NEGATIVE

## 2023-10-09 SURGERY — WEDGE RESECTION, LUNG, ROBOT-ASSISTED, THORACOSCOPIC
Anesthesia: General | Site: Chest | Laterality: Right

## 2023-10-09 MED ORDER — FENTANYL CITRATE (PF) 250 MCG/5ML IJ SOLN
INTRAMUSCULAR | Status: AC
  Filled 2023-10-09: qty 5

## 2023-10-09 MED ORDER — OXYCODONE HCL 5 MG PO TABS
5.0000 mg | ORAL_TABLET | ORAL | Status: DC | PRN
  Administered 2023-10-09 – 2023-10-11 (×3): 10 mg via ORAL
  Filled 2023-10-09: qty 2
  Filled 2023-10-09 (×4): qty 1
  Filled 2023-10-09: qty 2

## 2023-10-09 MED ORDER — FENTANYL CITRATE (PF) 250 MCG/5ML IJ SOLN
INTRAMUSCULAR | Status: DC | PRN
  Administered 2023-10-09: 50 ug via INTRAVENOUS
  Administered 2023-10-09: 100 ug via INTRAVENOUS

## 2023-10-09 MED ORDER — PROPOFOL 10 MG/ML IV BOLUS
INTRAVENOUS | Status: AC
  Filled 2023-10-09: qty 20

## 2023-10-09 MED ORDER — ACETAMINOPHEN 10 MG/ML IV SOLN
INTRAVENOUS | Status: AC
  Filled 2023-10-09: qty 100

## 2023-10-09 MED ORDER — OXYCODONE HCL 5 MG PO TABS: 5.0000 mg | ORAL_TABLET | Freq: Once | ORAL | Status: DC | PRN

## 2023-10-09 MED ORDER — LIDOCAINE 2% (20 MG/ML) 5 ML SYRINGE
INTRAMUSCULAR | Status: AC
  Filled 2023-10-09: qty 5

## 2023-10-09 MED ORDER — ONDANSETRON HCL 4 MG/2ML IJ SOLN: 4.0000 mg | Freq: Once | INTRAMUSCULAR | Status: DC | PRN

## 2023-10-09 MED ORDER — ORAL CARE MOUTH RINSE: 15.0000 mL | Freq: Once | OROMUCOSAL | Status: AC

## 2023-10-09 MED ORDER — PHENYLEPHRINE 80 MCG/ML (10ML) SYRINGE FOR IV PUSH (FOR BLOOD PRESSURE SUPPORT)
PREFILLED_SYRINGE | INTRAVENOUS | Status: DC | PRN
  Administered 2023-10-09: 80 ug via INTRAVENOUS

## 2023-10-09 MED ORDER — BUPIVACAINE LIPOSOME 1.3 % IJ SUSP
INTRAMUSCULAR | Status: AC
  Filled 2023-10-09: qty 20

## 2023-10-09 MED ORDER — ONDANSETRON HCL 4 MG/2ML IJ SOLN: 4.0000 mg | Freq: Four times a day (QID) | INTRAMUSCULAR | Status: DC | PRN

## 2023-10-09 MED ORDER — OXYCODONE HCL 5 MG/5ML PO SOLN: 5.0000 mg | Freq: Once | ORAL | Status: DC | PRN

## 2023-10-09 MED ORDER — IPRATROPIUM-ALBUTEROL 0.5-2.5 (3) MG/3ML IN SOLN: 3.0000 mL | Freq: Four times a day (QID) | RESPIRATORY_TRACT | Status: DC

## 2023-10-09 MED ORDER — ALBUTEROL SULFATE (2.5 MG/3ML) 0.083% IN NEBU: 3.0000 mL | INHALATION_SOLUTION | Freq: Four times a day (QID) | RESPIRATORY_TRACT | Status: DC | PRN

## 2023-10-09 MED ORDER — UMECLIDINIUM BROMIDE 62.5 MCG/ACT IN AEPB
1.0000 | INHALATION_SPRAY | Freq: Every day | RESPIRATORY_TRACT | Status: DC
  Administered 2023-10-11: 1 via RESPIRATORY_TRACT
  Filled 2023-10-09: qty 7

## 2023-10-09 MED ORDER — LINACLOTIDE 145 MCG PO CAPS: 145.0000 ug | ORAL_CAPSULE | Freq: Every day | ORAL | Status: DC | PRN

## 2023-10-09 MED ORDER — GLYCOPYRROLATE PF 0.2 MG/ML IJ SOSY
PREFILLED_SYRINGE | INTRAMUSCULAR | Status: AC
  Filled 2023-10-09: qty 1

## 2023-10-09 MED ORDER — ROSUVASTATIN CALCIUM 20 MG PO TABS
40.0000 mg | ORAL_TABLET | Freq: Every day | ORAL | Status: DC
  Administered 2023-10-10 – 2023-10-11 (×2): 40 mg via ORAL
  Filled 2023-10-09 (×2): qty 2

## 2023-10-09 MED ORDER — RANOLAZINE ER 500 MG PO TB12
500.0000 mg | ORAL_TABLET | Freq: Two times a day (BID) | ORAL | Status: DC
  Administered 2023-10-09 – 2023-10-11 (×4): 500 mg via ORAL
  Filled 2023-10-09 (×4): qty 1

## 2023-10-09 MED ORDER — ONDANSETRON HCL 4 MG/2ML IJ SOLN
INTRAMUSCULAR | Status: AC
  Filled 2023-10-09: qty 2

## 2023-10-09 MED ORDER — PROPOFOL 10 MG/ML IV BOLUS
INTRAVENOUS | Status: DC | PRN
  Administered 2023-10-09: 50 mg via INTRAVENOUS

## 2023-10-09 MED ORDER — SUCCINYLCHOLINE CHLORIDE 200 MG/10ML IV SOSY
PREFILLED_SYRINGE | INTRAVENOUS | Status: AC
  Filled 2023-10-09: qty 10

## 2023-10-09 MED ORDER — FENTANYL CITRATE (PF) 100 MCG/2ML IJ SOLN
25.0000 ug | INTRAMUSCULAR | Status: DC | PRN
  Administered 2023-10-09 (×2): 50 ug via INTRAVENOUS

## 2023-10-09 MED ORDER — ENOXAPARIN SODIUM 40 MG/0.4ML IJ SOSY
40.0000 mg | PREFILLED_SYRINGE | Freq: Every day | INTRAMUSCULAR | Status: DC
  Administered 2023-10-10 – 2023-10-11 (×2): 40 mg via SUBCUTANEOUS
  Filled 2023-10-09 (×2): qty 0.4

## 2023-10-09 MED ORDER — BUDESON-GLYCOPYRROL-FORMOTEROL 160-9-4.8 MCG/ACT IN AERO: 2.0000 | INHALATION_SPRAY | Freq: Two times a day (BID) | RESPIRATORY_TRACT | Status: DC

## 2023-10-09 MED ORDER — ONDANSETRON HCL 4 MG/2ML IJ SOLN
INTRAMUSCULAR | Status: DC | PRN
  Administered 2023-10-09: 4 mg via INTRAVENOUS

## 2023-10-09 MED ORDER — DEXAMETHASONE SODIUM PHOSPHATE 10 MG/ML IJ SOLN
INTRAMUSCULAR | Status: DC | PRN
  Administered 2023-10-09: 10 mg via INTRAVENOUS

## 2023-10-09 MED ORDER — CEFAZOLIN SODIUM-DEXTROSE 2-4 GM/100ML-% IV SOLN
2.0000 g | INTRAVENOUS | Status: AC
  Administered 2023-10-09: 2 g via INTRAVENOUS
  Filled 2023-10-09: qty 100

## 2023-10-09 MED ORDER — EPHEDRINE 5 MG/ML INJ
INTRAVENOUS | Status: AC
  Filled 2023-10-09: qty 5

## 2023-10-09 MED ORDER — MORPHINE SULFATE (PF) 2 MG/ML IV SOLN
2.0000 mg | INTRAVENOUS | Status: DC | PRN
  Administered 2023-10-09: 2 mg via INTRAVENOUS
  Filled 2023-10-09: qty 1

## 2023-10-09 MED ORDER — FENTANYL CITRATE (PF) 100 MCG/2ML IJ SOLN
INTRAMUSCULAR | Status: AC
  Filled 2023-10-09: qty 2

## 2023-10-09 MED ORDER — ROCURONIUM BROMIDE 10 MG/ML (PF) SYRINGE
PREFILLED_SYRINGE | INTRAVENOUS | Status: AC
  Filled 2023-10-09: qty 10

## 2023-10-09 MED ORDER — SODIUM CHLORIDE 0.9 % IV SOLN: INTRAVENOUS | Status: DC

## 2023-10-09 MED ORDER — ACETAMINOPHEN 10 MG/ML IV SOLN: 1000.0000 mg | Freq: Once | INTRAVENOUS | Status: DC | PRN

## 2023-10-09 MED ORDER — CEFAZOLIN SODIUM-DEXTROSE 2-4 GM/100ML-% IV SOLN
2.0000 g | Freq: Three times a day (TID) | INTRAVENOUS | Status: AC
  Administered 2023-10-09 – 2023-10-10 (×2): 2 g via INTRAVENOUS
  Filled 2023-10-09 (×2): qty 100

## 2023-10-09 MED ORDER — ACETAMINOPHEN 10 MG/ML IV SOLN
INTRAVENOUS | Status: DC | PRN
  Administered 2023-10-09: 1000 mg via INTRAVENOUS

## 2023-10-09 MED ORDER — DEXAMETHASONE SODIUM PHOSPHATE 10 MG/ML IJ SOLN
INTRAMUSCULAR | Status: AC
  Filled 2023-10-09: qty 1

## 2023-10-09 MED ORDER — ALBUMIN HUMAN 5 % IV SOLN: INTRAVENOUS | Status: DC | PRN

## 2023-10-09 MED ORDER — PANTOPRAZOLE SODIUM 40 MG PO TBEC
40.0000 mg | DELAYED_RELEASE_TABLET | Freq: Every day | ORAL | Status: DC
  Administered 2023-10-10 – 2023-10-11 (×2): 40 mg via ORAL
  Filled 2023-10-09 (×2): qty 1

## 2023-10-09 MED ORDER — BISACODYL 5 MG PO TBEC
10.0000 mg | DELAYED_RELEASE_TABLET | Freq: Every day | ORAL | Status: DC
  Administered 2023-10-10 – 2023-10-11 (×2): 10 mg via ORAL
  Filled 2023-10-09 (×2): qty 2

## 2023-10-09 MED ORDER — ACETAMINOPHEN 500 MG PO TABS
1000.0000 mg | ORAL_TABLET | Freq: Four times a day (QID) | ORAL | Status: DC
  Administered 2023-10-09 – 2023-10-11 (×7): 1000 mg via ORAL
  Filled 2023-10-09 (×8): qty 2

## 2023-10-09 MED ORDER — LUNG SURGERY BOOK
Freq: Once | Status: AC
  Filled 2023-10-09: qty 1

## 2023-10-09 MED ORDER — ROCURONIUM BROMIDE 10 MG/ML (PF) SYRINGE
PREFILLED_SYRINGE | INTRAVENOUS | Status: DC | PRN
  Administered 2023-10-09: 100 mg via INTRAVENOUS

## 2023-10-09 MED ORDER — SODIUM CHLORIDE FLUSH 0.9 % IV SOLN
INTRAVENOUS | Status: DC | PRN
  Administered 2023-10-09: 100 mL

## 2023-10-09 MED ORDER — GABAPENTIN 300 MG PO CAPS
300.0000 mg | ORAL_CAPSULE | Freq: Two times a day (BID) | ORAL | Status: DC
  Administered 2023-10-09 – 2023-10-11 (×4): 300 mg via ORAL
  Filled 2023-10-09 (×4): qty 1

## 2023-10-09 MED ORDER — MIDAZOLAM HCL 2 MG/2ML IJ SOLN
INTRAMUSCULAR | Status: AC
  Filled 2023-10-09: qty 2

## 2023-10-09 MED ORDER — NICOTINE 21 MG/24HR TD PT24
21.0000 mg | MEDICATED_PATCH | Freq: Every day | TRANSDERMAL | Status: DC
  Administered 2023-10-09 – 2023-10-11 (×3): 21 mg via TRANSDERMAL
  Filled 2023-10-09 (×3): qty 1

## 2023-10-09 MED ORDER — ACETAMINOPHEN 160 MG/5ML PO SOLN: 1000.0000 mg | Freq: Four times a day (QID) | ORAL | Status: DC

## 2023-10-09 MED ORDER — CHLORHEXIDINE GLUCONATE 0.12 % MT SOLN: 15.0000 mL | Freq: Once | OROMUCOSAL | Status: AC

## 2023-10-09 MED ORDER — SENNOSIDES-DOCUSATE SODIUM 8.6-50 MG PO TABS: 1.0000 | ORAL_TABLET | Freq: Every day | ORAL | Status: DC

## 2023-10-09 MED ORDER — LIDOCAINE 2% (20 MG/ML) 5 ML SYRINGE
INTRAMUSCULAR | Status: DC | PRN
  Administered 2023-10-09: 100 mg via INTRAVENOUS

## 2023-10-09 MED ORDER — PHENYLEPHRINE 80 MCG/ML (10ML) SYRINGE FOR IV PUSH (FOR BLOOD PRESSURE SUPPORT)
PREFILLED_SYRINGE | INTRAVENOUS | Status: AC
  Filled 2023-10-09: qty 10

## 2023-10-09 MED ORDER — BUSPIRONE HCL 15 MG PO TABS
15.0000 mg | ORAL_TABLET | Freq: Two times a day (BID) | ORAL | Status: DC
  Administered 2023-10-09 – 2023-10-11 (×4): 15 mg via ORAL
  Filled 2023-10-09 (×6): qty 1

## 2023-10-09 MED ORDER — BUPIVACAINE HCL (PF) 0.5 % IJ SOLN
INTRAMUSCULAR | Status: AC
  Filled 2023-10-09: qty 30

## 2023-10-09 MED ORDER — SUGAMMADEX SODIUM 200 MG/2ML IV SOLN
INTRAVENOUS | Status: DC | PRN
  Administered 2023-10-09: 200 mg via INTRAVENOUS

## 2023-10-09 MED ORDER — TRAZODONE HCL 50 MG PO TABS
50.0000 mg | ORAL_TABLET | Freq: Every day | ORAL | Status: DC
Start: 2023-10-10 — End: 2023-10-11
  Administered 2023-10-10: 50 mg via ORAL
  Filled 2023-10-09: qty 1

## 2023-10-09 MED ORDER — TRAMADOL HCL 50 MG PO TABS
50.0000 mg | ORAL_TABLET | Freq: Four times a day (QID) | ORAL | Status: DC | PRN
  Administered 2023-10-09 – 2023-10-10 (×4): 100 mg via ORAL
  Filled 2023-10-09 (×4): qty 2

## 2023-10-09 MED ORDER — 0.9 % SODIUM CHLORIDE (POUR BTL) OPTIME
TOPICAL | Status: DC | PRN
  Administered 2023-10-09: 1000 mL

## 2023-10-09 MED ORDER — CHLORHEXIDINE GLUCONATE 0.12 % MT SOLN
OROMUCOSAL | Status: AC
  Administered 2023-10-09: 15 mL via OROMUCOSAL
  Filled 2023-10-09: qty 15

## 2023-10-09 MED ORDER — FLUTICASONE FUROATE-VILANTEROL 200-25 MCG/ACT IN AEPB
1.0000 | INHALATION_SPRAY | Freq: Every day | RESPIRATORY_TRACT | Status: DC
  Filled 2023-10-09 (×2): qty 28

## 2023-10-09 MED ORDER — MIDAZOLAM HCL 2 MG/2ML IJ SOLN
INTRAMUSCULAR | Status: DC | PRN
  Administered 2023-10-09: 2 mg via INTRAVENOUS

## 2023-10-09 MED ORDER — EZETIMIBE 10 MG PO TABS
10.0000 mg | ORAL_TABLET | Freq: Every day | ORAL | Status: DC
  Administered 2023-10-10 – 2023-10-11 (×2): 10 mg via ORAL
  Filled 2023-10-09 (×2): qty 1

## 2023-10-09 SURGICAL SUPPLY — 67 items
BLADE CLIPPER SURG (BLADE) ×2 IMPLANT
CANISTER SUCT 3000ML PPV (MISCELLANEOUS) ×4 IMPLANT
CANNULA REDUCER 12-8 DVNC XI (CANNULA) ×4 IMPLANT
CATH THORACIC 28FR (CATHETERS) ×2 IMPLANT
CHLORAPREP W/TINT 26 (MISCELLANEOUS) ×2 IMPLANT
CNTNR URN SCR LID CUP LEK RST (MISCELLANEOUS) ×10 IMPLANT
DEFOGGER SCOPE WARMER CLEARIFY (MISCELLANEOUS) ×2 IMPLANT
DERMABOND ADVANCED .7 DNX12 (GAUZE/BANDAGES/DRESSINGS) ×2 IMPLANT
DRAIN CHANNEL 28F RND 3/8 FF (WOUND CARE) IMPLANT
DRAIN CHANNEL 32F RND 10.7 FF (WOUND CARE) IMPLANT
DRAPE ARM DVNC X/XI (DISPOSABLE) ×8 IMPLANT
DRAPE COLUMN DVNC XI (DISPOSABLE) ×2 IMPLANT
DRAPE CV SPLIT W-CLR ANES SCRN (DRAPES) ×2 IMPLANT
DRAPE HALF SHEET 40X57 (DRAPES) ×2 IMPLANT
DRAPE SURG ORHT 6 SPLT 77X108 (DRAPES) ×2 IMPLANT
ELECT BLADE 6.5 EXT (BLADE) IMPLANT
ELECT REM PT RETURN 9FT ADLT (ELECTROSURGICAL) ×1
ELECTRODE REM PT RTRN 9FT ADLT (ELECTROSURGICAL) ×2 IMPLANT
FORCEPS BPLR LNG DVNC XI (INSTRUMENTS) IMPLANT
FORCEPS CADIERE DVNC XI (FORCEP) IMPLANT
GAUZE KITTNER 4X5 RF (MISCELLANEOUS) ×2 IMPLANT
GAUZE SPONGE 4X4 12PLY STRL (GAUZE/BANDAGES/DRESSINGS) ×2 IMPLANT
GLOVE BIO SURGEON STRL SZ7.5 (GLOVE) ×4 IMPLANT
GLOVE SURG SS PI 8.0 STRL IVOR (GLOVE) ×2 IMPLANT
GOWN STRL REUS W/ TWL LRG LVL3 (GOWN DISPOSABLE) ×4 IMPLANT
GOWN STRL REUS W/ TWL XL LVL3 (GOWN DISPOSABLE) ×4 IMPLANT
GOWN STRL REUS W/TWL 2XL LVL3 (GOWN DISPOSABLE) ×2 IMPLANT
GRASPER TIP-UP FEN DVNC XI (INSTRUMENTS) IMPLANT
HEMOSTAT SURGICEL 2X14 (HEMOSTASIS) ×6 IMPLANT
KIT BASIN OR (CUSTOM PROCEDURE TRAY) ×2 IMPLANT
KIT TURNOVER KIT B (KITS) ×2 IMPLANT
NDL 22X1.5 STRL (OR ONLY) (MISCELLANEOUS) ×2 IMPLANT
NEEDLE 22X1.5 STRL (OR ONLY) (MISCELLANEOUS) ×1
NS IRRIG 1000ML POUR BTL (IV SOLUTION) ×6 IMPLANT
PACK CHEST (CUSTOM PROCEDURE TRAY) ×2 IMPLANT
PAD ARMBOARD 7.5X6 YLW CONV (MISCELLANEOUS) ×10 IMPLANT
PORT ACCESS TROCAR AIRSEAL 12 (TROCAR) ×2 IMPLANT
RELOAD STAPLE 45 3.5 BLU DVNC (STAPLE) IMPLANT
RELOAD STAPLE 45 4.6 BLK DVNC (STAPLE) IMPLANT
SEAL UNIV 5-12 XI (MISCELLANEOUS) ×8 IMPLANT
SET TRI-LUMEN FLTR TB AIRSEAL (TUBING) ×2 IMPLANT
SOL ELECTROSURG ANTI STICK (MISCELLANEOUS) ×1
SOLUTION ELECTROSURG ANTI STCK (MISCELLANEOUS) ×2 IMPLANT
STAPLER 45 SUREFORM DVNC (STAPLE) IMPLANT
STAPLER RELOAD 3.5X45 BLU DVNC (STAPLE) ×8
STAPLER RELOAD 45 4.6 BLK DVNC (STAPLE) ×1
STOPCOCK 4 WAY LG BORE MALE ST (IV SETS) ×2 IMPLANT
SUT PDS AB 1 CTX 36 (SUTURE) IMPLANT
SUT PROLENE 4-0 RB1 .5 CRCL 36 (SUTURE) IMPLANT
SUT SILK 1 MH (SUTURE) ×2 IMPLANT
SUT SILK 2 0 SH (SUTURE) IMPLANT
SUT SILK 2 0SH CR/8 30 (SUTURE) IMPLANT
SUT VIC AB 1 CTX36XBRD ANBCTR (SUTURE) IMPLANT
SUT VIC AB 2-0 CT1 TAPERPNT 27 (SUTURE) ×2 IMPLANT
SUT VIC AB 3-0 SH 27X BRD (SUTURE) ×6 IMPLANT
SUT VICRYL 0 TIES 12 18 (SUTURE) ×2 IMPLANT
SUT VICRYL 0 UR6 27IN ABS (SUTURE) ×4 IMPLANT
SYR 10ML LL (SYRINGE) ×2 IMPLANT
SYR 20ML LL LF (SYRINGE) ×2 IMPLANT
SYR 50ML LL SCALE MARK (SYRINGE) ×2 IMPLANT
SYSTEM RETRIEVAL ANCHOR 8 (MISCELLANEOUS) IMPLANT
SYSTEM SAHARA CHEST DRAIN ATS (WOUND CARE) ×2 IMPLANT
TAPE CLOTH 4X10 WHT NS (GAUZE/BANDAGES/DRESSINGS) ×2 IMPLANT
TOWEL GREEN STERILE (TOWEL DISPOSABLE) ×2 IMPLANT
TRAY FOLEY MTR SLVR 16FR STAT (SET/KITS/TRAYS/PACK) ×2 IMPLANT
TUBING EXTENTION W/L.L. (IV SETS) ×2 IMPLANT
WATER STERILE IRR 1000ML POUR (IV SOLUTION) ×2 IMPLANT

## 2023-10-09 NOTE — Interval H&P Note (Signed)
History and Physical Interval Note:  10/09/2023 12:33 PM  [name]  has presented today for surgery, with the diagnosis of ILD.  The various methods of treatment have been discussed with the patient and family. After consideration of risks, benefits and other options for treatment, the patient has consented to  Procedure(s): XI ROBOTIC ASSISTED THORACOSCOPY-WEDGE RESECTION (Right) as a surgical intervention.  The patient's history has been reviewed, patient examined, no change in status, stable for surgery.  I have reviewed the patient's chart and labs.  Questions were answered to the patient's satisfaction.     Traeh Milroy Keane Scrape

## 2023-10-09 NOTE — Plan of Care (Signed)
  Problem: Education: Goal: Knowledge of General Education information will improve Description: Including pain rating scale, medication(s)/side effects and non-pharmacologic comfort measures Outcome: Progressing   Problem: Health Behavior/Discharge Planning: Goal: Ability to manage health-related needs will improve Outcome: Progressing   Problem: Clinical Measurements: Goal: Ability to maintain clinical measurements within normal limits will improve Outcome: Progressing Goal: Will remain free from infection Outcome: Progressing Goal: Diagnostic test results will improve Outcome: Progressing Goal: Respiratory complications will improve Outcome: Progressing Goal: Cardiovascular complication will be avoided Outcome: Progressing   Problem: Activity: Goal: Risk for activity intolerance will decrease Outcome: Progressing   Problem: Nutrition: Goal: Adequate nutrition will be maintained Outcome: Progressing   Problem: Coping: Goal: Level of anxiety will decrease Outcome: Progressing   Problem: Elimination: Goal: Will not experience complications related to bowel motility Outcome: Progressing Goal: Will not experience complications related to urinary retention Outcome: Progressing   Problem: Pain Management: Goal: General experience of comfort will improve Outcome: Progressing   Problem: Safety: Goal: Ability to remain free from injury will improve Outcome: Progressing   Problem: Skin Integrity: Goal: Risk for impaired skin integrity will decrease Outcome: Progressing   Problem: Education: Goal: Knowledge of disease or condition will improve Outcome: Progressing Goal: Knowledge of the prescribed therapeutic regimen will improve Outcome: Progressing   Problem: Activity: Goal: Risk for activity intolerance will decrease Outcome: Progressing   Problem: Cardiac: Goal: Will achieve and/or maintain hemodynamic stability Outcome: Progressing   Problem: Clinical  Measurements: Goal: Postoperative complications will be avoided or minimized Outcome: Progressing   Problem: Respiratory: Goal: Respiratory status will improve Outcome: Progressing   Problem: Pain Management: Goal: Pain level will decrease Outcome: Progressing   Problem: Skin Integrity: Goal: Wound healing without signs and symptoms infection will improve Outcome: Progressing

## 2023-10-09 NOTE — Brief Op Note (Signed)
10/09/2023  2:41 PM  PATIENT:  [name]  57 y.o. female  PRE-OPERATIVE DIAGNOSIS:  Interstitial Lung Disease  POST-OPERATIVE DIAGNOSIS:  Interstitial Lung Disease  PROCEDURE:  Procedure(s): XI ROBOTIC ASSISTED THORACOSCOPY-WEDGE RESECTION (Right) INTERCOSTAL NERVE BLOCK (Right)  SURGEON:  Surgeons and Role:    * Lightfoot, Eliezer Lofts, MD - Primary  PHYSICIAN ASSISTANT: Aloha Gell PA-C  ASSISTANTS: none   ANESTHESIA:   local and general  EBL:  {None/Minimal: 21241}   BLOOD ADMINISTERED:none  DRAINS:  Right pleural drain    LOCAL MEDICATIONS USED:  OTHER Exparel  SPECIMEN:  Source of Specimen:  Right upper lobe wedge, right middle lobe wedge, right lower lobe wedge  DISPOSITION OF SPECIMEN:  PATHOLOGY  COUNTS:  YES  DICTATION: .Dragon Dictation  PLAN OF CARE: Admit to inpatient   PATIENT DISPOSITION:  PACU - hemodynamically stable.   Delay start of Pharmacological VTE agent (>24hrs) due to surgical blood loss or risk of bleeding: no

## 2023-10-09 NOTE — Transfer of Care (Signed)
Immediate Anesthesia Transfer of Care Note  Patient: [name]  Procedure(s) Performed: XI ROBOTIC ASSISTED THORACOSCOPY-WEDGE RESECTION (Right: Chest) INTERCOSTAL NERVE BLOCK (Right: Chest)  Patient Location: PACU  Anesthesia Type:General  Level of Consciousness: awake, alert , and oriented  Airway & Oxygen Therapy: Patient Spontanous Breathing and Patient connected to nasal cannula oxygen  Post-op Assessment: Report given to RN and Post -op Vital signs reviewed and stable  Post vital signs: Reviewed and stable  Last Vitals:  Vitals Value Taken Time  BP 124/69 10/09/23 1511  Temp    Pulse 99 10/09/23 1514  Resp 27 10/09/23 1514  SpO2 89 % 10/09/23 1514  Vitals shown include unfiled device data.  Last Pain:  Vitals:   10/09/23 1014  PainSc: 7          Complications: No notable events documented.

## 2023-10-09 NOTE — Discharge Summary (Signed)
Physician Discharge Summary       [address] [place].[address]       [place] [address]             [phone]    Patient ID: [name] MRN: [id number] DOB/AGE: [birth year] 57 y.o.  Admit date: 10/09/2023 Discharge date: 10/12/2023  Admission Diagnoses: Interstitial lung disease  Discharge Diagnoses:  Principal Problem:   ILD (interstitial lung disease) (HCC) Active Problems:   Interstitial lung disease (HCC)   Patient Active Problem List   Diagnosis Date Noted   Interstitial lung disease (HCC) 10/09/2023   Iron deficiency 12/30/2022   Vitamin D deficiency 08/19/2022   Urinary incontinence 04/09/2022   ILD (interstitial lung disease) (HCC) 06/27/2021   Unstable angina (HCC) 04/13/2021   Atypical nevi 03/17/2021   Atherosclerosis 03/08/2021   Grief reaction 03/02/2021   Acute diarrhea 12/29/2020   Acute right lumbar radiculopathy 12/11/2020   Encounter for smoking cessation counseling 12/11/2020   Acute lumbar back pain 07/25/2020   COPD (chronic obstructive pulmonary disease) (HCC) 06/01/2020   GAD (generalized anxiety disorder) 12/26/2019   Poor social situation 08/29/2019   Abnormal chest CT 08/29/2019   Poor dentition 06/25/2019   Lung nodule, solitary 06/25/2019   Chronic cough 11/26/2018   Recent unintentional weight loss over several months 11/05/2018   Current smoker 11/05/2018   Paresthesia 07/22/2018   Skull deformity 07/22/2018   Hyperlipidemia 07/22/2018   MDD (major depressive disorder) 01/09/2012   Migraine with aura 05/09/2007   Mitral valve prolapse 05/09/2007     Consults: None  Procedure (s): SURGERY  10/09/2023   Patient:  [name] Pre-Op Dx: Interstitial lung disease   Post-op Dx:  same Procedure: - Robotic assisted right video thoracoscopy - Wedge resection of the upper, middle and lower lobe - Intercostal nerve block   Surgeon and Role:      * Lightfoot, Eliezer Lofts, MD - Primary Assistant: B. Stehler, PA-C An  experienced assistant was required given the complexity of this surgery and the standard of surgical care. The assistant was needed for exposure, dissection, suctioning, retraction of delicate tissues and sutures, instrument exchange and for overall help during this procedure.  Anesthesia  general History of Present Illness:    [name] is a 57 y.o. female with a past medical history of HLD, mitral valve prolapse, MDD, GAD, anemia with iron deficiency, arthritis, CKD, COPD, MI, thyroid disease, urinary incontinence and tobacco abuse. She presents for surgical evaluation of interstitial lung disease. She smokes intermittently, but has not had any cigarettes since the beginning of November. She has had progressive shortness of breath with CT findings on 07/24/23 concerning for ILD of cross-sectional imaging.    Dr. Cliffton Asters reviewed the patient's diagnostic studies and determined she would benefit from surgical intervention. He reviewed the patient's treatment options as well as the risks and benefits of surgery. [name] was agreeable to proceed with surgery.   Hospital Course: [name] presented to [facility] and was brought to the operating room on 10/09/23. She underwent robotic assisted right wedge resection of the right upper, middle and lower lobes. She tolerated the procedure well, was extubated and transferred to the PACU in stable condition.   Postoperative hospital course:  The patient is remained stable.  She initially had some difficulty with pain and medications were adjusted.  She had a small air leak on postop day 1 chest tube was kept in place.  Oxygen is being weaned and  she is maintaining adequate saturations.  Blood loss from surgery was minimal and she is noted not to be anemic.  Renal function has remained within normal limits.  Incisions are healing well without evidence of infection.  On postop day 2 her chest tube was removed and she showed no evidence of airleak  and chest x-ray remained stable.  Blood pressure remains relatively low but this is a chronic condition for her.  She is on multiple medications that could potentially be playing a part.  Will continue to be monitored by her primary care and cardiology/pulmonology.  Final pathology remains pending.  She was tolerating activity without significant difficulty and felt to be stable for discharge on postop day #2.    Latest Vital Signs: Blood pressure (!) 81/46, pulse (!) 59, temperature 97.8 F (36.6 C), temperature source Oral, resp. rate 18, height 5' (1.524 m), weight 53.5 kg, SpO2 96%.  Physical Exam:  General appearance: alert, cooperative, and no distress Heart: regular rate and rhythm Lungs: coarse throughout Abdomen: benign Extremities: no edema or calf tenderness Wound: incis healing well some serosang drainage Discharge Condition:good  Recent laboratory studies:  Lab Results  Component Value Date   WBC 8.9 10/11/2023   HGB 10.5 (L) 10/11/2023   HCT 32.0 (L) 10/11/2023   MCV 88.9 10/11/2023   PLT 252 10/11/2023   Lab Results  Component Value Date   NA 137 10/11/2023   K 3.5 10/11/2023   CL 105 10/11/2023   CO2 26 10/11/2023   CREATININE 1.00 10/11/2023   GLUCOSE 98 10/11/2023      Diagnostic Studies: DG Chest 2 View Result Date: 10/11/2023 CLINICAL DATA:  Chest tube removal. EXAM: CHEST - 2 VIEW COMPARISON:  Radiographs 10/11/2023, 10/10/2023 and earlier. CT 07/24/2023. FINDINGS: 1046 hours. Interval right chest tube removal. No residual pneumothorax identified. Stable opacity along the right apical chain sutures, presumably postsurgical contusion. Probable mild atelectasis superimposed on coarse basilar predominant chronic interstitial lung disease. There may be a small right pleural effusion. The heart size and mediastinal contours are stable. IMPRESSION: 1. Interval right chest tube removal without evidence of residual pneumothorax. 2. Stable opacity along the right  apical chain sutures, presumably postsurgical contusion. 3. Probable mild atelectasis superimposed on chronic interstitial lung disease. Electronically Signed   By: Carey Bullocks M.D.   On: 10/11/2023 14:39   DG Chest 1 View Result Date: 10/11/2023 CLINICAL DATA:  [id number] with interstitial lung disease and chest tube in place. EXAM: CHEST  1 VIEW COMPARISON:  Portable chest yesterday at 5:11 a.m. FINDINGS: 4:57 a.m. right chest tube again terminates in the apex. Small underlying apical pneumothorax appears unchanged, estimated 5% or less volume. Band opacity underlying the right apical staple line is again noted, probable localized hemorrhage or atelectasis. There is stable atelectasis in the lung bases associated coarse interstitial scarring change. No new infiltrate is seen. The remaining lungs are clear. Mild cardiomegaly without CHF. IMPRESSION: 1. Stable small right apical pneumothorax with chest tube in place. 2. Stable band opacity underlying the right apical staple line, probable localized hemorrhage or atelectasis. 3. Stable atelectasis in the lung bases associated with coarse interstitial thickening. 4. Mild cardiomegaly without CHF. Electronically Signed   By: Almira Bar M.D.   On: 10/11/2023 07:46   DG Chest Port 1 View Result Date: 10/10/2023 CLINICAL DATA:  [id number], [phone], status post partial lobectomy of lung with interstitial lung disease and chest tube place. EXAM: PORTABLE CHEST 1 VIEW COMPARISON:  Portable chest yesterday at  3:39 p.m. FINDINGS: 5:11 a.m. Right chest tube with tip in the apex is again noted and a small right apical pneumothorax estimated 5% or less of the chest volume. There is a staple line again noted of the medial right apical lung, underlying increased opacity which could be atelectasis or postsurgical localized hemorrhage. There are bibasilar atelectatic changes chronic coarsened interstitium. No focal pneumonia is evident. There is mild cardiomegaly without CHF  findings. The mediastinum is normally outlined.  Mild aortic atherosclerosis. Osteopenia and slight thoracic dextroscoliosis. Multiple overlying monitor wires. Compare: Stable overall aeration. IMPRESSION: 1. Right chest tube with tip in the apex and a small right apical pneumothorax estimated 5% or less of the chest volume. 2. Staple line again noted of the medial right apical lung, underlying increased opacity which could be atelectasis or postsurgical localized hemorrhage. 3. Bibasilar atelectatic changes. 4. Mild cardiomegaly without CHF findings. 5. Aortic atherosclerosis. Electronically Signed   By: Almira Bar M.D.   On: 10/10/2023 06:59   DG Chest Port 1 View Result Date: 10/09/2023 CLINICAL DATA:  Status post partial lobectomy EXAM: PORTABLE CHEST 1 VIEW COMPARISON:  10/08/2023 FINDINGS: Cardiac shadow is within normal limits. Bibasilar atelectatic changes are noted. Right chest tube is seen with minimal apical pneumothorax on right. Postsurgical changes in the right apex are seen consistent with recent wedge resection. No bony abnormality is noted. IMPRESSION: Postsurgical changes with small apical pneumothorax on the right. Chest tube is noted in place. Electronically Signed   By: Alcide Clever M.D.   On: 10/09/2023 20:57   DG Chest 2 View Result Date: 10/08/2023 CLINICAL DATA:  History of interstitial lung disease with planned wedge resection EXAM: CHEST - 2 VIEW COMPARISON:  Chest radiograph dated 06/19/2022 FINDINGS: Normal lung volumes. Lower lung predominant reticulations in keeping with known interstitial lung disease. No focal consolidations. No pleural effusion or pneumothorax. The heart size and mediastinal contours are within normal limits. No acute osseous abnormality. IMPRESSION: Lower lung predominant reticulations in keeping with known interstitial lung disease. No focal consolidations. Electronically Signed   By: Agustin Cree M.D.   On: 10/08/2023 16:55   Results for orders placed  or performed during the hospital encounter of 10/09/23 (from the past 48 hours)  CBC     Status: Abnormal   Collection Time: 10/11/23  2:16 AM  Result Value Ref Range   WBC 8.9 4.0 - 10.5 K/uL   RBC 3.60 (L) 3.87 - 5.11 MIL/uL   Hemoglobin 10.5 (L) 12.0 - 15.0 g/dL   HCT 60.4 (L) 54.0 - 98.1 %   MCV 88.9 80.0 - 100.0 fL   MCH 29.2 26.0 - 34.0 pg   MCHC 32.8 30.0 - 36.0 g/dL   RDW 19.1 47.8 - 29.5 %   Platelets 252 150 - 400 K/uL   nRBC 0.0 0.0 - 0.2 %    Comment: Performed at [facility], [address] [place], [place] [address]  Comprehensive metabolic panel     Status: Abnormal   Collection Time: 10/11/23  2:16 AM  Result Value Ref Range   Sodium 137 135 - 145 mmol/L   Potassium 3.5 3.5 - 5.1 mmol/L   Chloride 105 98 - 111 mmol/L   CO2 26 22 - 32 mmol/L   Glucose, Bld 98 70 - 99 mg/dL    Comment: Glucose reference range applies only to samples taken after fasting for at least 8 hours.   BUN 11 6 - 20 mg/dL   Creatinine, Ser  1.00 0.44 - 1.00 mg/dL   Calcium 8.6 (L) 8.9 - 10.3 mg/dL   Total Protein 5.4 (L) 6.5 - 8.1 g/dL   Albumin 3.1 (L) 3.5 - 5.0 g/dL   AST 52 (H) 15 - 41 U/L   ALT 15 0 - 44 U/L   Alkaline Phosphatase 48 38 - 126 U/L   Total Bilirubin 0.6 <1.2 mg/dL   GFR, Estimated >16 >10 mL/min    Comment: (NOTE) Calculated using the CKD-EPI Creatinine Equation (2021)    Anion gap 6 5 - 15    Comment: Performed at [facility], [address], [place], [place] [address]       Discharge Instructions     Discharge patient   Complete by: As directed    Discharge disposition: 01-Home or Self Care   Discharge patient date: 10/11/2023       Discharge Medications: Allergies as of 10/11/2023       Reactions   Dextromethorphan Other (See Comments)   keeps awake, legs constantly moving   Doxylamine Other (See Comments)   keeps awake, legs constantly moving   Pseudoeph-doxylamine-dm-apap Other (See Comments)   REACTION: keeps awake, legs  constantly moving NYQUIL   Pseudoephedrine Hcl Other (See Comments)   keeps awake, legs constantly moving        Medication List     TAKE these medications    albuterol (2.5 MG/3ML) 0.083% nebulizer solution Commonly known as: PROVENTIL INHALE 3 ML BY NEBULIZATION EVERY 6 HOURS AS NEEDED FOR WHEEZING OR SHORTNESS OF BREATH   Ventolin HFA 108 (90 Base) MCG/ACT inhaler Generic drug: albuterol INHALE 1-2 PUFFS BY MOUTH EVERY 6 HOURS AS NEEDED FOR WHEEZE OR SHORTNESS OF BREATH   aspirin 81 MG chewable tablet Chew 81 mg by mouth daily.   Breztri Aerosphere 160-9-4.8 MCG/ACT Aero Generic drug: Budeson-Glycopyrrol-Formoterol Inhale 2 puffs into the lungs in the morning and at bedtime.   busPIRone 15 MG tablet Commonly known as: BUSPAR Take 1 tablet (15 mg total) by mouth 2 (two) times daily.   ezetimibe 10 MG tablet Commonly known as: ZETIA Take 1 tablet (10 mg total) by mouth daily.   gabapentin 300 MG capsule Commonly known as: NEURONTIN TAKE 1 CAPSULE BY MOUTH TWICE A DAY   linaclotide 145 MCG Caps capsule Commonly known as: Linzess Take 1 capsule (145 mcg total) by mouth daily as needed (Constipation).   methocarbamol 500 MG tablet Commonly known as: ROBAXIN Take 1 tablet (500 mg total) by mouth every 8 (eight) hours as needed for muscle spasms.   nicotine 21 mg/24hr patch Commonly known as: Nicoderm CQ Place 1 patch (21 mg total) onto the skin daily.   nitroGLYCERIN 0.4 MG SL tablet Commonly known as: NITROSTAT Place 1 tablet (0.4 mg total) under the tongue every 5 (five) minutes as needed for chest pain.   omeprazole 20 MG capsule Commonly known as: PRILOSEC TAKE 1 CAPSULE BY MOUTH EVERY DAY   ondansetron 4 MG disintegrating tablet Commonly known as: ZOFRAN-ODT Take 1 tablet (4 mg total) by mouth daily.   ranolazine 500 MG 12 hr tablet Commonly known as: Ranexa Take 1 tablet (500 mg total) by mouth 2 (two) times daily.   rosuvastatin 40 MG  tablet Commonly known as: CRESTOR TAKE 1 TABLET BY MOUTH EVERY DAY   SUMAtriptan 6 MG/0.5ML Soln injection Commonly known as: IMITREX Inject 0.5 mLs (6 mg total) into the skin every 2 (two) hours as needed for migraine or headache. May repeat in  2 hours if headache persists or recurs.  Limit to twice per week.   traMADol 50 MG tablet Commonly known as: ULTRAM Take 1 tablet (50 mg total) by mouth every 6 (six) hours as needed for up to 7 days for moderate pain (pain score 4-6) (mild pain). What changed:  how much to take when to take this reasons to take this   traZODone 50 MG tablet Commonly known as: DESYREL Take 1 tablet (50 mg total) by mouth at bedtime. What changed: See the new instructions.   Vitamin D (Ergocalciferol) 1.25 MG (50000 UNIT) Caps capsule Commonly known as: DRISDOL Take 1 capsule (50,000 Units total) by mouth once a week.        Follow Up Appointments:  Follow-up Information     Lightfoot, Eliezer Lofts, MD Follow up.   Specialty: Cardiothoracic Surgery Why: Please see discharge paperwork for details of follow-up appointment with Dr. Cliffton Asters. Jan. 3, 2025 @10 :40 AM Contact information: [address] [place] [address] [phone]                 Signed: Noel Christmas 10/12/2023, 9:57 AM

## 2023-10-09 NOTE — Anesthesia Procedure Notes (Signed)
Arterial Line Insertion Start/End12/07/2023 10:45 AM Performed by: Margarita Rana, CRNA, CRNA  Patient location: Pre-op. Preanesthetic checklist: patient identified, IV checked, site marked, risks and benefits discussed, surgical consent, monitors and equipment checked, pre-op evaluation, timeout performed and anesthesia consent Lidocaine 1% used for infiltration Left, radial was placed Catheter size: 20 G Hand hygiene performed  and maximum sterile barriers used   Attempts: 1 Procedure performed without using ultrasound guided technique. Following insertion, dressing applied. Post procedure assessment: normal and unchanged  Patient tolerated the procedure well with no immediate complications.

## 2023-10-09 NOTE — Anesthesia Procedure Notes (Signed)
Procedure Name: Intubation Date/Time: 10/09/2023 1:26 PM  Performed by: Camillia Herter, CRNAPre-anesthesia Checklist: Patient identified, Emergency Drugs available, Suction available and Patient being monitored Patient Re-evaluated:Patient Re-evaluated prior to induction Oxygen Delivery Method: Circle System Utilized Preoxygenation: Pre-oxygenation with 100% oxygen Induction Type: IV induction Ventilation: Mask ventilation without difficulty Laryngoscope Size: Mac and 3 Grade View: Grade I Tube type: Oral Endobronchial tube: Double lumen EBT and 37 Fr Number of attempts: 1 Airway Equipment and Method: Stylet and Oral airway Placement Confirmation: ETT inserted through vocal cords under direct vision, positive ETCO2 and breath sounds checked- equal and bilateral Tube secured with: Tape Dental Injury: Teeth and Oropharynx as per pre-operative assessment

## 2023-10-09 NOTE — Op Note (Signed)
      [address]       [place] [address]             [phone]        10/09/2023  Patient:  [name] Pre-Op Dx: Interstitial lung disease   Post-op Dx:  same Procedure: - Robotic assisted right video thoracoscopy - Wedge resection of the upper, middle and lower lobe - Intercostal nerve block  Surgeon and Role:      * Jahmiyah Dullea, Eliezer Lofts, MD - Primary Assistant: B. Stehler, PA-C An experienced assistant was required given the complexity of this surgery and the standard of surgical care. The assistant was needed for exposure, dissection, suctioning, retraction of delicate tissues and sutures, instrument exchange and for overall help during this procedure.  Anesthesia  general EBL:  10ml Blood Administration: noe Specimen:  Wedge resection of the upper, middle and lower lobes.  Drains: 57 F argyle chest tube in right chest Counts: correct   Indications: 57yo female with concern for interstitial lung disease.  She has very marginal lung function.  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  The risks and benefits of R RATS, wedge resection were discussed in detail.  The patient is agreeable to proceed.   Findings: Thickened parenchyma in all lobes  Operative Technique: After the risks, benefits and alternatives were thoroughly discussed, the patient was brought to the operative theatre.  Anesthesia was induced, and the patient was then placed in a left lateral decubitus position and was prepped and draped in normal sterile fashion.  An appropriate surgical pause was performed, and pre-operative antibiotics were dosed accordingly.  We began by placing our 3 robotic ports in the 7th intercostal space targeting the hilum of the lung.  The robot was then docked and all instruments were passed under direct visualization.  The lung was freed of all pleural adhesions.  Wedge resections of the upper, middle and lower lobes were  performed.  An intercostal nerve block was performed under direct visualization.  A mechanical pleurodesis was then performed.  A 43F chest with then placed, and we watch the remaining lobes re-expand.  The skin and soft tissue were closed with absorbable suture.    The patient tolerated the procedure without any immediate complications, and was transferred to the PACU in stable condition.  [name] Keane Scrape

## 2023-10-09 NOTE — Hospital Course (Addendum)
History of Present Illness:    [name] is a 58 y.o. female with a past medical history of HLD, mitral valve prolapse, MDD, GAD, anemia with iron deficiency, arthritis, CKD, COPD, MI, thyroid disease, urinary incontinence and tobacco abuse. She presents for surgical evaluation of interstitial lung disease. She smokes intermittently, but has not had any cigarettes since the beginning of November. She has had progressive shortness of breath with CT findings on 07/24/23 concerning for ILD of cross-sectional imaging.    Dr. Cliffton Asters reviewed the patient's diagnostic studies and determined she would benefit from surgical intervention. He reviewed the patient's treatment options as well as the risks and benefits of surgery. [name] was agreeable to proceed with surgery.   Hospital Course: [name] presented to [facility] and was brought to the operating room on 10/09/23. She underwent robotic assisted right wedge resection of the right upper, middle and lower lobes. She tolerated the procedure well, was extubated and transferred to the PACU in stable condition.   Postoperative hospital course:  The patient is remained stable.  She initially had some difficulty with pain and medications were adjusted.  She had a small air leak on postop day 1 chest tube was kept in place.  Oxygen is being weaned and she is maintaining adequate saturations.  Blood loss from surgery was minimal and she is noted not to be anemic.  Renal function has remained within normal limits.  Incisions are healing well without evidence of infection.  On postop day 2 her chest tube was removed and she showed no evidence of airleak and chest x-ray remained stable.  Blood pressure remains relatively low but this is a chronic condition for her.  She is on multiple medications that could potentially be playing a part.  Will continue to be monitored by her primary care and cardiology/pulmonology.  Final pathology remains pending.   She was tolerating activity without significant difficulty and felt to be stable for discharge on postop day #2.

## 2023-10-10 ENCOUNTER — Encounter (HOSPITAL_COMMUNITY): Payer: Self-pay | Admitting: Thoracic Surgery (Cardiothoracic Vascular Surgery)

## 2023-10-10 ENCOUNTER — Inpatient Hospital Stay (HOSPITAL_COMMUNITY): Payer: Medicaid Other

## 2023-10-10 DIAGNOSIS — R918 Other nonspecific abnormal finding of lung field: Secondary | ICD-10-CM | POA: Diagnosis not present

## 2023-10-10 DIAGNOSIS — J9811 Atelectasis: Secondary | ICD-10-CM | POA: Diagnosis not present

## 2023-10-10 DIAGNOSIS — J849 Interstitial pulmonary disease, unspecified: Secondary | ICD-10-CM | POA: Diagnosis not present

## 2023-10-10 DIAGNOSIS — Z48813 Encounter for surgical aftercare following surgery on the respiratory system: Secondary | ICD-10-CM | POA: Diagnosis not present

## 2023-10-10 LAB — CBC
HCT: 38 % (ref 36.0–46.0)
Hemoglobin: 12.2 g/dL (ref 12.0–15.0)
MCH: 28.4 pg (ref 26.0–34.0)
MCHC: 32.1 g/dL (ref 30.0–36.0)
MCV: 88.4 fL (ref 80.0–100.0)
Platelets: 309 10*3/uL (ref 150–400)
RBC: 4.3 MIL/uL (ref 3.87–5.11)
RDW: 14.8 % (ref 11.5–15.5)
WBC: 11.9 10*3/uL — ABNORMAL HIGH (ref 4.0–10.5)
nRBC: 0 % (ref 0.0–0.2)

## 2023-10-10 LAB — BASIC METABOLIC PANEL
Anion gap: 9 (ref 5–15)
BUN: 10 mg/dL (ref 6–20)
CO2: 22 mmol/L (ref 22–32)
Calcium: 8.9 mg/dL (ref 8.9–10.3)
Chloride: 104 mmol/L (ref 98–111)
Creatinine, Ser: 0.82 mg/dL (ref 0.44–1.00)
GFR, Estimated: >60 mL/min (ref >60)
Glucose, Bld: 118 mg/dL — ABNORMAL HIGH (ref 70–99)
Potassium: 4.1 mmol/L (ref 3.5–5.1)
Sodium: 135 mmol/L (ref 135–145)

## 2023-10-10 MED ORDER — METHOCARBAMOL 500 MG PO TABS
500.0000 mg | ORAL_TABLET | Freq: Three times a day (TID) | ORAL | Status: DC | PRN
  Administered 2023-10-10 – 2023-10-11 (×2): 500 mg via ORAL
  Filled 2023-10-10 (×2): qty 1

## 2023-10-10 MED ORDER — ALBUTEROL SULFATE (2.5 MG/3ML) 0.083% IN NEBU: 2.5000 mg | INHALATION_SOLUTION | RESPIRATORY_TRACT | Status: DC | PRN

## 2023-10-10 MED ORDER — ALBUMIN HUMAN 5 % IV SOLN
12.5000 g | Freq: Once | INTRAVENOUS | Status: AC
  Administered 2023-10-10: 12.5 g via INTRAVENOUS
  Filled 2023-10-10: qty 250

## 2023-10-10 MED ORDER — LIDOCAINE 5 % EX PTCH
1.0000 | MEDICATED_PATCH | CUTANEOUS | Status: DC
  Administered 2023-10-10: 1 via TRANSDERMAL
  Filled 2023-10-10: qty 1

## 2023-10-10 MED ORDER — IPRATROPIUM-ALBUTEROL 0.5-2.5 (3) MG/3ML IN SOLN
3.0000 mL | Freq: Four times a day (QID) | RESPIRATORY_TRACT | Status: DC
  Administered 2023-10-10: 3 mL via RESPIRATORY_TRACT
  Filled 2023-10-10: qty 3

## 2023-10-10 MED ORDER — IPRATROPIUM-ALBUTEROL 0.5-2.5 (3) MG/3ML IN SOLN
3.0000 mL | Freq: Two times a day (BID) | RESPIRATORY_TRACT | Status: DC
  Administered 2023-10-11: 3 mL via RESPIRATORY_TRACT
  Filled 2023-10-10 (×2): qty 3

## 2023-10-10 MED ORDER — KETOROLAC TROMETHAMINE 30 MG/ML IJ SOLN
30.0000 mg | Freq: Four times a day (QID) | INTRAMUSCULAR | Status: DC | PRN
  Administered 2023-10-10 – 2023-10-11 (×4): 30 mg via INTRAVENOUS
  Filled 2023-10-10 (×4): qty 1

## 2023-10-10 NOTE — Progress Notes (Addendum)
1 Day Post-Op Procedure(s) (LRB): XI ROBOTIC ASSISTED THORACOSCOPY-WEDGE RESECTION (Right) INTERCOSTAL NERVE BLOCK (Right) Subjective: Pain control has been fair  Objective: Vital signs in last 24 hours: Temp:  [97.5 F (36.4 C)-98.5 F (36.9 C)] 98.1 F (36.7 C) (12/11 0421) Pulse Rate:  [47-97] 53 (12/11 0421) Cardiac Rhythm: Sinus bradycardia (12/11 0706) Resp:  [14-27] 23 (12/11 0421) BP: (83-133)/(48-78) 83/57 (12/11 0421) SpO2:  [92 %-100 %] 96 % (12/11 0421) Arterial Line BP: (98-156)/(51-78) 98/51 (12/10 1700) Weight:  [53.5 kg] 53.5 kg (12/10 1729)  Hemodynamic parameters for last 24 hours:    Intake/Output from previous day: 12/10 0701 - 12/11 0700 In: 1090 [P.O.:240; I.V.:500; IV Piggyback:350] Out: 260 [Urine:200; Blood:50; Chest Tube:10] Intake/Output this shift: No intake/output data recorded.  General appearance: alert, cooperative, and no distress Heart: regular rate and rhythm Lungs: coarse, fair air exchange Abdomen: benign Extremities: PAS in place, no edema Wound: incis ok  Lab Results: Recent Labs    10/08/23 1100 10/10/23 0236  WBC 9.7 11.9*  HGB 13.9 12.2  HCT 43.2 38.0  PLT 391 309   BMET:  Recent Labs    10/08/23 1100 10/10/23 0236  NA 139 135  K 3.4* 4.1  CL 103 104  CO2 24 22  GLUCOSE 104* 118*  BUN <5* 10  CREATININE 1.13* 0.82  CALCIUM 10.2 8.9    PT/INR:  Recent Labs    10/08/23 1100  LABPROT 13.1  INR 1.0   ABG    Component Value Date/Time   TCO2 25 02/13/2011 1533   CBG (last 3)  No results for input(s): "GLUCAP" in the last 72 hours.  Meds Scheduled Meds:  acetaminophen  1,000 mg Oral Q6H   Or   acetaminophen (TYLENOL) oral liquid 160 mg/5 mL  1,000 mg Oral Q6H   bisacodyl  10 mg Oral Daily   busPIRone  15 mg Oral BID   enoxaparin (LOVENOX) injection  40 mg Subcutaneous Daily   ezetimibe  10 mg Oral Daily   fluticasone furoate-vilanterol  1 puff Inhalation Daily   gabapentin  300 mg Oral BID    nicotine  21 mg Transdermal Daily   pantoprazole  40 mg Oral Daily   ranolazine  500 mg Oral BID   rosuvastatin  40 mg Oral Daily   senna-docusate  1 tablet Oral QHS   traZODone  50 mg Oral QHS   umeclidinium bromide  1 puff Inhalation Daily   Continuous Infusions: PRN Meds:.albuterol, linaclotide, morphine injection, ondansetron (ZOFRAN) IV, oxyCODONE, traMADol  Xrays DG Chest Port 1 View  Result Date: 10/10/2023 CLINICAL DATA:  [id number], [id number], status post partial lobectomy of lung with interstitial lung disease and chest tube place. EXAM: PORTABLE CHEST 1 VIEW COMPARISON:  Portable chest yesterday at 3:39 p.m. FINDINGS: 5:11 a.m. Right chest tube with tip in the apex is again noted and a small right apical pneumothorax estimated 5% or less of the chest volume. There is a staple line again noted of the medial right apical lung, underlying increased opacity which could be atelectasis or postsurgical localized hemorrhage. There are bibasilar atelectatic changes chronic coarsened interstitium. No focal pneumonia is evident. There is mild cardiomegaly without CHF findings. The mediastinum is normally outlined.  Mild aortic atherosclerosis. Osteopenia and slight thoracic dextroscoliosis. Multiple overlying monitor wires. Compare: Stable overall aeration. IMPRESSION: 1. Right chest tube with tip in the apex and a small right apical pneumothorax estimated 5% or less of the chest volume. 2. Staple line again noted of the  medial right apical lung, underlying increased opacity which could be atelectasis or postsurgical localized hemorrhage. 3. Bibasilar atelectatic changes. 4. Mild cardiomegaly without CHF findings. 5. Aortic atherosclerosis. Electronically Signed   By: Almira Bar M.D.   On: 10/10/2023 06:59   DG Chest Port 1 View  Result Date: 10/09/2023 CLINICAL DATA:  Status post partial lobectomy EXAM: PORTABLE CHEST 1 VIEW COMPARISON:  10/08/2023 FINDINGS: Cardiac shadow is within normal limits.  Bibasilar atelectatic changes are noted. Right chest tube is seen with minimal apical pneumothorax on right. Postsurgical changes in the right apex are seen consistent with recent wedge resection. No bony abnormality is noted. IMPRESSION: Postsurgical changes with small apical pneumothorax on the right. Chest tube is noted in place. Electronically Signed   By: Alcide Clever M.D.   On: 10/09/2023 20:57   DG Chest 2 View  Result Date: 10/08/2023 CLINICAL DATA:  History of interstitial lung disease with planned wedge resection EXAM: CHEST - 2 VIEW COMPARISON:  Chest radiograph dated 06/19/2022 FINDINGS: Normal lung volumes. Lower lung predominant reticulations in keeping with known interstitial lung disease. No focal consolidations. No pleural effusion or pneumothorax. The heart size and mediastinal contours are within normal limits. No acute osseous abnormality. IMPRESSION: Lower lung predominant reticulations in keeping with known interstitial lung disease. No focal consolidations. Electronically Signed   By: Agustin Cree M.D.   On: 10/08/2023 16:55    Assessment/Plan: S/P Procedure(s) (LRB): XI ROBOTIC ASSISTED THORACOSCOPY-WEDGE RESECTION (Right) INTERCOSTAL NERVE BLOCK (Right) POD#1  1 afeb, SR/SBrady, s BP 80's-130's- mostly lower range- she says normal for her 2 O2 sats good on 2 liters [place], not on home O2 3 fair UOP, not sure if all has been recorded, normal renal fxn 4 minor reactive leukocytosis, no anemia 5 CXR- small right apical pntx/space- some atx, right base aeration looks improved some 6 CT - + air leak 7 will add toradol for pain 8 add duoneb    LOS: 1 day    Rowe Clack PA-C Pager [phone]  10/10/2023   Agree with above Small leak, continue IS, ambulation Adjusting pain medication  Saben Donigan O Burwell [place]

## 2023-10-10 NOTE — Plan of Care (Signed)
Problem: Education: Goal: Knowledge of General Education information will improve Description: Including pain rating scale, medication(s)/side effects and non-pharmacologic comfort measures 10/10/2023 1632 by Anders Simmonds, RN Outcome: Progressing 10/10/2023 1632 by Anders Simmonds, RN Outcome: Not Progressing   Problem: Health Behavior/Discharge Planning: Goal: Ability to manage health-related needs will improve 10/10/2023 1632 by Anders Simmonds, RN Outcome: Progressing 10/10/2023 1632 by Anders Simmonds, RN Outcome: Not Progressing   Problem: Clinical Measurements: Goal: Ability to maintain clinical measurements within normal limits will improve 10/10/2023 1632 by Anders Simmonds, RN Outcome: Progressing 10/10/2023 1632 by Anders Simmonds, RN Outcome: Not Progressing Goal: Will remain free from infection 10/10/2023 1632 by Anders Simmonds, RN Outcome: Progressing 10/10/2023 1632 by Anders Simmonds, RN Outcome: Not Progressing Goal: Diagnostic test results will improve 10/10/2023 1632 by Anders Simmonds, RN Outcome: Progressing 10/10/2023 1632 by Anders Simmonds, RN Outcome: Not Progressing Goal: Respiratory complications will improve 10/10/2023 1632 by Anders Simmonds, RN Outcome: Progressing 10/10/2023 1632 by Anders Simmonds, RN Outcome: Not Progressing Goal: Cardiovascular complication will be avoided 10/10/2023 1632 by Anders Simmonds, RN Outcome: Progressing 10/10/2023 1632 by Anders Simmonds, RN Outcome: Not Progressing   Problem: Activity: Goal: Risk for activity intolerance will decrease 10/10/2023 1632 by Anders Simmonds, RN Outcome: Progressing 10/10/2023 1632 by Anders Simmonds, RN Outcome: Not Progressing   Problem: Nutrition: Goal: Adequate nutrition will be maintained 10/10/2023 1632 by Anders Simmonds, RN Outcome: Progressing 10/10/2023 1632 by Anders Simmonds, RN Outcome: Not Progressing   Problem: Coping: Goal: Level of anxiety will  decrease 10/10/2023 1632 by Anders Simmonds, RN Outcome: Progressing 10/10/2023 1632 by Anders Simmonds, RN Outcome: Not Progressing   Problem: Elimination: Goal: Will not experience complications related to bowel motility 10/10/2023 1632 by Anders Simmonds, RN Outcome: Progressing 10/10/2023 1632 by Anders Simmonds, RN Outcome: Not Progressing Goal: Will not experience complications related to urinary retention 10/10/2023 1632 by Anders Simmonds, RN Outcome: Progressing 10/10/2023 1632 by Anders Simmonds, RN Outcome: Not Progressing   Problem: Pain Management: Goal: General experience of comfort will improve 10/10/2023 1632 by Anders Simmonds, RN Outcome: Progressing 10/10/2023 1632 by Anders Simmonds, RN Outcome: Not Progressing   Problem: Safety: Goal: Ability to remain free from injury will improve 10/10/2023 1632 by Anders Simmonds, RN Outcome: Progressing 10/10/2023 1632 by Anders Simmonds, RN Outcome: Not Progressing   Problem: Skin Integrity: Goal: Risk for impaired skin integrity will decrease 10/10/2023 1632 by Anders Simmonds, RN Outcome: Progressing 10/10/2023 1632 by Anders Simmonds, RN Outcome: Not Progressing   Problem: Education: Goal: Knowledge of disease or condition will improve 10/10/2023 1632 by Anders Simmonds, RN Outcome: Progressing 10/10/2023 1632 by Anders Simmonds, RN Outcome: Not Progressing Goal: Knowledge of the prescribed therapeutic regimen will improve 10/10/2023 1632 by Anders Simmonds, RN Outcome: Progressing 10/10/2023 1632 by Anders Simmonds, RN Outcome: Not Progressing   Problem: Activity: Goal: Risk for activity intolerance will decrease 10/10/2023 1632 by Anders Simmonds, RN Outcome: Progressing 10/10/2023 1632 by Anders Simmonds, RN Outcome: Not Progressing   Problem: Cardiac: Goal: Will achieve and/or maintain hemodynamic stability 10/10/2023 1632 by Anders Simmonds, RN Outcome: Progressing 10/10/2023 1632 by  Anders Simmonds, RN Outcome: Not Progressing   Problem: Clinical Measurements: Goal: Postoperative complications will be avoided or minimized 10/10/2023 1632 by Anders Simmonds, RN Outcome: Progressing 10/10/2023 1632 by Anders Simmonds, RN  Outcome: Not Progressing   Problem: Respiratory: Goal: Respiratory status will improve 10/10/2023 1632 by Anders Simmonds, RN Outcome: Progressing 10/10/2023 1632 by Anders Simmonds, RN Outcome: Not Progressing   Problem: Pain Management: Goal: Pain level will decrease 10/10/2023 1632 by Anders Simmonds, RN Outcome: Progressing 10/10/2023 1632 by Anders Simmonds, RN Outcome: Not Progressing   Problem: Skin Integrity: Goal: Wound healing without signs and symptoms infection will improve 10/10/2023 1632 by Anders Simmonds, RN Outcome: Progressing 10/10/2023 1632 by Anders Simmonds, RN Outcome: Not Progressing

## 2023-10-11 ENCOUNTER — Inpatient Hospital Stay (HOSPITAL_COMMUNITY): Payer: Medicaid Other

## 2023-10-11 DIAGNOSIS — R918 Other nonspecific abnormal finding of lung field: Secondary | ICD-10-CM | POA: Diagnosis not present

## 2023-10-11 DIAGNOSIS — Z4682 Encounter for fitting and adjustment of non-vascular catheter: Secondary | ICD-10-CM | POA: Diagnosis not present

## 2023-10-11 DIAGNOSIS — J939 Pneumothorax, unspecified: Secondary | ICD-10-CM | POA: Diagnosis not present

## 2023-10-11 DIAGNOSIS — J849 Interstitial pulmonary disease, unspecified: Secondary | ICD-10-CM | POA: Diagnosis not present

## 2023-10-11 LAB — COMPREHENSIVE METABOLIC PANEL
ALT: 15 U/L (ref 0–44)
AST: 52 U/L — ABNORMAL HIGH (ref 15–41)
Albumin: 3.1 g/dL — ABNORMAL LOW (ref 3.5–5.0)
Alkaline Phosphatase: 48 U/L (ref 38–126)
Anion gap: 6 (ref 5–15)
BUN: 11 mg/dL (ref 6–20)
CO2: 26 mmol/L (ref 22–32)
Calcium: 8.6 mg/dL — ABNORMAL LOW (ref 8.9–10.3)
Chloride: 105 mmol/L (ref 98–111)
Creatinine, Ser: 1 mg/dL (ref 0.44–1.00)
GFR, Estimated: >60 mL/min (ref >60)
Glucose, Bld: 98 mg/dL (ref 70–99)
Potassium: 3.5 mmol/L (ref 3.5–5.1)
Sodium: 137 mmol/L (ref 135–145)
Total Bilirubin: 0.6 mg/dL (ref <1.2)
Total Protein: 5.4 g/dL — ABNORMAL LOW (ref 6.5–8.1)

## 2023-10-11 LAB — CBC
HCT: 32 % — ABNORMAL LOW (ref 36.0–46.0)
Hemoglobin: 10.5 g/dL — ABNORMAL LOW (ref 12.0–15.0)
MCH: 29.2 pg (ref 26.0–34.0)
MCHC: 32.8 g/dL (ref 30.0–36.0)
MCV: 88.9 fL (ref 80.0–100.0)
Platelets: 252 10*3/uL (ref 150–400)
RBC: 3.6 MIL/uL — ABNORMAL LOW (ref 3.87–5.11)
RDW: 14.9 % (ref 11.5–15.5)
WBC: 8.9 10*3/uL (ref 4.0–10.5)
nRBC: 0 % (ref 0.0–0.2)

## 2023-10-11 MED ORDER — TRAZODONE HCL 50 MG PO TABS: 50.0000 mg | ORAL_TABLET | Freq: Every day | ORAL | Status: DC

## 2023-10-11 MED ORDER — LINACLOTIDE 145 MCG PO CAPS: 145.0000 ug | ORAL_CAPSULE | Freq: Every day | ORAL | Status: AC | PRN

## 2023-10-11 MED ORDER — METHOCARBAMOL 500 MG PO TABS: 500.0000 mg | ORAL_TABLET | Freq: Three times a day (TID) | ORAL | 0 refills | Status: DC | PRN

## 2023-10-11 MED ORDER — BUSPIRONE HCL 15 MG PO TABS: 15.0000 mg | ORAL_TABLET | Freq: Two times a day (BID) | ORAL | Status: DC

## 2023-10-11 MED ORDER — ONDANSETRON 4 MG PO TBDP: 4.0000 mg | ORAL_TABLET | Freq: Every day | ORAL | Status: DC

## 2023-10-11 MED ORDER — TRAMADOL HCL 50 MG PO TABS: 50.0000 mg | ORAL_TABLET | Freq: Four times a day (QID) | ORAL | 0 refills | Status: DC | PRN

## 2023-10-11 NOTE — Plan of Care (Signed)
  Problem: Education: Goal: Knowledge of General Education information will improve Description: Including pain rating scale, medication(s)/side effects and non-pharmacologic comfort measures Outcome: Adequate for Discharge   Problem: Health Behavior/Discharge Planning: Goal: Ability to manage health-related needs will improve Outcome: Adequate for Discharge   Problem: Clinical Measurements: Goal: Ability to maintain clinical measurements within normal limits will improve Outcome: Adequate for Discharge Goal: Will remain free from infection Outcome: Adequate for Discharge Goal: Diagnostic test results will improve Outcome: Adequate for Discharge Goal: Respiratory complications will improve Outcome: Adequate for Discharge Goal: Cardiovascular complication will be avoided Outcome: Adequate for Discharge   Problem: Activity: Goal: Risk for activity intolerance will decrease Outcome: Adequate for Discharge   Problem: Nutrition: Goal: Adequate nutrition will be maintained Outcome: Adequate for Discharge   Problem: Coping: Goal: Level of anxiety will decrease Outcome: Adequate for Discharge   Problem: Elimination: Goal: Will not experience complications related to bowel motility Outcome: Adequate for Discharge Goal: Will not experience complications related to urinary retention Outcome: Adequate for Discharge   Problem: Pain Management: Goal: General experience of comfort will improve Outcome: Adequate for Discharge   Problem: Safety: Goal: Ability to remain free from injury will improve Outcome: Adequate for Discharge   Problem: Skin Integrity: Goal: Risk for impaired skin integrity will decrease Outcome: Adequate for Discharge   Problem: Education: Goal: Knowledge of disease or condition will improve Outcome: Adequate for Discharge Goal: Knowledge of the prescribed therapeutic regimen will improve Outcome: Adequate for Discharge   Problem: Activity: Goal: Risk  for activity intolerance will decrease Outcome: Adequate for Discharge   Problem: Cardiac: Goal: Will achieve and/or maintain hemodynamic stability Outcome: Adequate for Discharge   Problem: Clinical Measurements: Goal: Postoperative complications will be avoided or minimized Outcome: Adequate for Discharge   Problem: Respiratory: Goal: Respiratory status will improve Outcome: Adequate for Discharge   Problem: Pain Management: Goal: Pain level will decrease Outcome: Adequate for Discharge   Problem: Skin Integrity: Goal: Wound healing without signs and symptoms infection will improve Outcome: Adequate for Discharge

## 2023-10-11 NOTE — Progress Notes (Signed)
Mobility Specialist Progress Note:    10/11/23 1200  Mobility  Activity Ambulated with assistance in hallway  Level of Assistance Contact guard assist, steadying assist  Assistive Device None  Distance Ambulated (ft) 200 ft  Activity Response Tolerated well  Mobility Referral Yes  Mobility visit 1 Mobility  Mobility Specialist Start Time (ACUTE ONLY) 1224  Mobility Specialist Stop Time (ACUTE ONLY) 1229  Mobility Specialist Time Calculation (min) (ACUTE ONLY) 5 min   Pt received in bed, agreeable to mobility. C/o some R flank pain, otherwise asymptomatic. Had some swaying during ambulation, though no overt stumbling or LOB. Pt left in bed with call bell and all needs met. RN aware.  D'Vante Earlene Plater Mobility Specialist Please contact via Special educational needs teacher or Rehab office at [phone]

## 2023-10-11 NOTE — TOC Transition Note (Signed)
Transition of Care [facility]) - Discharge Note   Patient Details  Name: [name] MRN: [id number] Date of Birth: November 28, 1965  [facility] CM/SW Contact:  Harriet Masson, RN Phone Number: 10/11/2023, 12:20 PM   Clinical Narrative:    Patient stable for discharge.  No TOC needs at this time.     Barriers to Discharge: Barriers Resolved   Patient Goals and CMS Choice Patient states their goals for this hospitalization and ongoing recovery are:: return home          Discharge Placement             home          Discharge Plan and Services Additional resources added to the After Visit Summary for                                       Social Drivers of Health (SDOH) Interventions SDOH Screenings   Food Insecurity: No Food Insecurity (10/09/2023)  Housing: Low Risk  (10/09/2023)  Transportation Needs: No Transportation Needs (10/09/2023)  Utilities: Not At Risk (10/09/2023)  Alcohol Screen: Low Risk  (03/21/2023)  Depression (PHQ2-9): High Risk (05/04/2023)  Financial Resource Strain: Low Risk  (03/21/2023)  Physical Activity: Unknown (03/21/2023)  Social Connections: Socially Isolated (03/21/2023)  Stress: Stress Concern Present (03/21/2023)  Tobacco Use: Medium Risk (10/09/2023)     Readmission Risk Interventions    10/11/2023   12:20 PM  Readmission Risk Prevention Plan  Post Dischage Appt Complete  Medication Screening Complete  Transportation Screening Complete

## 2023-10-11 NOTE — Progress Notes (Signed)
2 Days Post-Op Procedure(s) (LRB): XI ROBOTIC ASSISTED THORACOSCOPY-WEDGE RESECTION (Right) INTERCOSTAL NERVE BLOCK (Right) Subjective: Pain a bit better controlled today  Objective: Vital signs in last 24 hours: Temp:  [98 F (36.7 C)-98.6 F (37 C)] 98.4 F (36.9 C) (12/12 0454) Pulse Rate:  [53-63] 58 (12/12 0555) Cardiac Rhythm: Sinus bradycardia (12/11 1901) Resp:  [15-24] 21 (12/12 0555) BP: (81-104)/(40-55) 104/55 (12/12 0555) SpO2:  [90 %-96 %] 94 % (12/12 0555)  Hemodynamic parameters for last 24 hours:    Intake/Output from previous day: 12/11 0701 - 12/12 0700 In: 501.4 [P.O.:360; IV Piggyback:141.4] Out: 1300 [Urine:1300] Intake/Output this shift: No intake/output data recorded.  General appearance: alert, cooperative, and no distress Heart: regular rate and rhythm Lungs: coarse throughout Abdomen: benign Extremities: no edema or calf tenderness Wound: incis healing well some serosang drainage  Lab Results: Recent Labs    10/10/23 0236 10/11/23 0216  WBC 11.9* 8.9  HGB 12.2 10.5*  HCT 38.0 32.0*  PLT 309 252   BMET:  Recent Labs    10/10/23 0236 10/11/23 0216  NA 135 137  K 4.1 3.5  CL 104 105  CO2 22 26  GLUCOSE 118* 98  BUN 10 11  CREATININE 0.82 1.00  CALCIUM 8.9 8.6*    PT/INR:  Recent Labs    10/08/23 1100  LABPROT 13.1  INR 1.0   ABG    Component Value Date/Time   TCO2 25 02/13/2011 1533   CBG (last 3)  No results for input(s): "GLUCAP" in the last 72 hours.  Meds Scheduled Meds:  acetaminophen  1,000 mg Oral Q6H   Or   acetaminophen (TYLENOL) oral liquid 160 mg/5 mL  1,000 mg Oral Q6H   bisacodyl  10 mg Oral Daily   busPIRone  15 mg Oral BID   enoxaparin (LOVENOX) injection  40 mg Subcutaneous Daily   ezetimibe  10 mg Oral Daily   fluticasone furoate-vilanterol  1 puff Inhalation Daily   gabapentin  300 mg Oral BID   ipratropium-albuterol  3 mL Nebulization BID   lidocaine  1 patch Transdermal Q24H   nicotine   21 mg Transdermal Daily   pantoprazole  40 mg Oral Daily   ranolazine  500 mg Oral BID   rosuvastatin  40 mg Oral Daily   senna-docusate  1 tablet Oral QHS   traZODone  50 mg Oral QHS   umeclidinium bromide  1 puff Inhalation Daily   Continuous Infusions: PRN Meds:.albuterol, ketorolac, linaclotide, methocarbamol, morphine injection, ondansetron (ZOFRAN) IV, oxyCODONE, traMADol  Xrays DG Chest 1 View Result Date: 10/11/2023 CLINICAL DATA:  [id number] with interstitial lung disease and chest tube in place. EXAM: CHEST  1 VIEW COMPARISON:  Portable chest yesterday at 5:11 a.m. FINDINGS: 4:57 a.m. right chest tube again terminates in the apex. Small underlying apical pneumothorax appears unchanged, estimated 5% or less volume. Band opacity underlying the right apical staple line is again noted, probable localized hemorrhage or atelectasis. There is stable atelectasis in the lung bases associated coarse interstitial scarring change. No new infiltrate is seen. The remaining lungs are clear. Mild cardiomegaly without CHF. IMPRESSION: 1. Stable small right apical pneumothorax with chest tube in place. 2. Stable band opacity underlying the right apical staple line, probable localized hemorrhage or atelectasis. 3. Stable atelectasis in the lung bases associated with coarse interstitial thickening. 4. Mild cardiomegaly without CHF. Electronically Signed   By: Almira Bar M.D.   On: 10/11/2023 07:46   DG Chest [facility] 1 View Result  Date: 10/10/2023 CLINICAL DATA:  [id number], [id number], status post partial lobectomy of lung with interstitial lung disease and chest tube place. EXAM: PORTABLE CHEST 1 VIEW COMPARISON:  Portable chest yesterday at 3:39 p.m. FINDINGS: 5:11 a.m. Right chest tube with tip in the apex is again noted and a small right apical pneumothorax estimated 5% or less of the chest volume. There is a staple line again noted of the medial right apical lung, underlying increased opacity which could be  atelectasis or postsurgical localized hemorrhage. There are bibasilar atelectatic changes chronic coarsened interstitium. No focal pneumonia is evident. There is mild cardiomegaly without CHF findings. The mediastinum is normally outlined.  Mild aortic atherosclerosis. Osteopenia and slight thoracic dextroscoliosis. Multiple overlying monitor wires. Compare: Stable overall aeration. IMPRESSION: 1. Right chest tube with tip in the apex and a small right apical pneumothorax estimated 5% or less of the chest volume. 2. Staple line again noted of the medial right apical lung, underlying increased opacity which could be atelectasis or postsurgical localized hemorrhage. 3. Bibasilar atelectatic changes. 4. Mild cardiomegaly without CHF findings. 5. Aortic atherosclerosis. Electronically Signed   By: Almira Bar M.D.   On: 10/10/2023 06:59   DG Chest Port 1 View Result Date: 10/09/2023 CLINICAL DATA:  Status post partial lobectomy EXAM: PORTABLE CHEST 1 VIEW COMPARISON:  10/08/2023 FINDINGS: Cardiac shadow is within normal limits. Bibasilar atelectatic changes are noted. Right chest tube is seen with minimal apical pneumothorax on right. Postsurgical changes in the right apex are seen consistent with recent wedge resection. No bony abnormality is noted. IMPRESSION: Postsurgical changes with small apical pneumothorax on the right. Chest tube is noted in place. Electronically Signed   By: Alcide Clever M.D.   On: 10/09/2023 20:57    Assessment/Plan: S/P Procedure(s) (LRB): XI ROBOTIC ASSISTED THORACOSCOPY-WEDGE RESECTION (Right) INTERCOSTAL NERVE BLOCK (Right) POD#2  1 afeb, S BP 80's-100's, sinur rhythm/brady- her norm 2 O2 sats ok on RA 3 voiding, good UOP 4 normal renal fxn 5 minor increase in ASR 52- , low protein stores 6 minor expected ABLA, likely equilibrating post surgery 7 CXR unchanged, small space/pntx 5%- atx is stable 8 CT - no air leak 8 D/C chest tube this am - poss home later today    LOS: 2 days    Rowe Clack 10/11/2023

## 2023-10-11 NOTE — Anesthesia Postprocedure Evaluation (Signed)
Anesthesia Post Note  Patient: [name]  Procedure(s) Performed: XI ROBOTIC ASSISTED THORACOSCOPY-WEDGE RESECTION (Right: Chest) INTERCOSTAL NERVE BLOCK (Right: Chest)     Patient location during evaluation: PACU Anesthesia Type: General Level of consciousness: awake and alert Pain management: pain level controlled Vital Signs Assessment: post-procedure vital signs reviewed and stable Respiratory status: spontaneous breathing, nonlabored ventilation, respiratory function stable and patient connected to nasal cannula oxygen Cardiovascular status: blood pressure returned to baseline and stable Postop Assessment: no apparent nausea or vomiting Anesthetic complications: no   No notable events documented.  Last Vitals:  Vitals:   10/11/23 0454 10/11/23 0555  BP: (!) 82/52 (!) 104/55  Pulse: (!) 53 (!) 58  Resp: 16 (!) 21  Temp: 36.9 C   SpO2: 94% 94%    Last Pain:  Vitals:   10/11/23 0555  TempSrc:   PainSc: 10-Worst pain ever                 Lemmie Vanlanen S

## 2023-10-12 ENCOUNTER — Ambulatory Visit: Payer: Medicaid Other

## 2023-10-12 ENCOUNTER — Telehealth: Payer: Self-pay | Admitting: Physician Assistant

## 2023-10-12 NOTE — Telephone Encounter (Signed)
Patient states she had a lung biopsy on 10/09/23, she was discharged from the hospital yesterday after her chest tube was removed. She reports her BP in the hospital was 70's/40's. She reports she had "a little dizziness" in the hospital.  BP this morning is closer to baseline at 90/59. Denies any symptoms at present.  She states she was advised by hospital staff to follow-up with cardiologist. Memory Argue next available appointment with DOD Dr. Odis Hollingshead on 10/15/23 at 2:30pm. Patient accepted and appt scheduled.  Advised patient to continue to monitor BP and be sure she is staying hydrated. Advised on ED precautions. Patient scheduled for nurse visit today, cancelled per patient's request. Patient verbalized understanding and expressed appreciation for call.

## 2023-10-12 NOTE — Telephone Encounter (Signed)
  Pt c/o BP issue: STAT if pt c/o blurred vision, one-sided weakness or slurred speech  1. What are your last 5 BP readings? 90/59   2. Are you having any other symptoms (ex. Dizziness, headache, blurred vision, passed out)? none  3. What is your BP issue? Pt said, he just got home from the hospital and she can't come in today for her nurse visit and she wants to reschedule. However, she does want to speak with Dayna or her nurse about her BP. This morning her BP 90/59

## 2023-10-15 ENCOUNTER — Other Ambulatory Visit: Payer: Self-pay | Admitting: Physician Assistant

## 2023-10-15 ENCOUNTER — Telehealth: Payer: Self-pay | Admitting: *Deleted

## 2023-10-15 ENCOUNTER — Ambulatory Visit: Payer: Medicaid Other | Admitting: Cardiology

## 2023-10-15 MED ORDER — OXYCODONE HCL 5 MG PO TABS: 5.0000 mg | ORAL_TABLET | ORAL | 0 refills | Status: DC | PRN

## 2023-10-15 NOTE — Telephone Encounter (Signed)
Patient contacted the office requesting a change in pain meds. Per patient Tramadol is not alleviating patient's pain. States she is experiencing throbbing at her incisions. Patient states she is taking a muscle relaxer as well. Denies any issues with incisions. Advised patient that Oxycodone will be sent in by E. Barrett, PA to patient's preferred pharmacy. Patient acknowledge understanding.

## 2023-10-15 NOTE — Progress Notes (Signed)
Patient called requesting different pain medication.  States Tramadol is not providing enough relief.  Review of hospitalization shows patient was using Oxycodone post operatively.  Will provide 30 tablets of 5 mg every 4 hours.  Lowella Dandy, PA-C 3:49 PM 10/15/23

## 2023-10-17 ENCOUNTER — Telehealth: Payer: Self-pay

## 2023-10-17 NOTE — Transitions of Care (Post Inpatient/ED Visit) (Signed)
   10/17/2023  Name: [name] MRN: [id number] DOB: 02/18/1966  Today's TOC FU Call Status: Today's TOC FU Call Status:: Successful TOC FU Call Completed TOC FU Call Complete Date: 10/17/23 Patient's Name and Date of Birth confirmed.  Transition Care Management Follow-up Telephone Call Date of Discharge: 10/11/23 Discharge Facility: [facility] Type of Discharge: Inpatient Admission Primary Inpatient Discharge Diagnosis:: ILD (interstitial lung disease) Any questions or concerns?: No  Items Reviewed: Did you receive and understand the discharge instructions provided?: Yes Medications obtained,verified, and reconciled?: Yes (Medications Reviewed) Any new allergies since your discharge?: No Dietary orders reviewed?: Yes Type of Diet Ordered:: heart healthy Do you have support at home?: Yes People in Home: child(ren), adult Name of Support/Comfort Primary Source: son  Medications Reviewed Today: Medications Reviewed Today   Medications were not reviewed in this encounter     Home Care and Equipment/Supplies: Were Home Health Services Ordered?: No Any new equipment or medical supplies ordered?: No  Functional Questionnaire: Do you need assistance with bathing/showering or dressing?: No Do you need assistance with meal preparation?: No Do you need assistance with eating?: No Do you have difficulty maintaining continence: No Do you need assistance with getting out of bed/getting out of a chair/moving?: No Do you have difficulty managing or taking your medications?: No  Follow up appointments reviewed: PCP Follow-up appointment confirmed?: Yes Date of PCP follow-up appointment?: 11/06/23 Follow-up Provider: [facility]y [facility] Follow-up appointment confirmed?: Yes Date of Specialist follow-up appointment?: 11/02/23 Follow-Up Specialty Provider:: Thoracic with Corliss Skains Do you need transportation to your follow-up appointment?: No Do you  understand care options if your condition(s) worsen?: Yes-patient verbalized understanding    SIGNATURE. Elsie Lincoln, RN

## 2023-10-19 LAB — SURGICAL PATHOLOGY

## 2023-10-22 ENCOUNTER — Other Ambulatory Visit: Payer: Self-pay | Admitting: Physician Assistant

## 2023-10-22 ENCOUNTER — Other Ambulatory Visit: Payer: Self-pay | Admitting: Family

## 2023-10-22 DIAGNOSIS — R202 Paresthesia of skin: Secondary | ICD-10-CM

## 2023-10-22 MED ORDER — OXYCODONE HCL 5 MG PO TABS: 5.0000 mg | ORAL_TABLET | Freq: Four times a day (QID) | ORAL | 0 refills | Status: DC | PRN

## 2023-10-22 NOTE — Progress Notes (Signed)
Patient requesting Oxycodone since she will be out of her current prescription tomorrow. She was last prescribed 30 tablets of Oxycodone 5mg  Q4H on 12/16. I will prescribe 28 tablets of Oxycodone 5mg  Q6H. She has a follow up appointment with Dr. Cliffton Asters on 01/03.   Jenny Reichmann, PA-C 10/22/23

## 2023-10-22 NOTE — Telephone Encounter (Signed)
Complete

## 2023-10-25 NOTE — Telephone Encounter (Signed)
NFN. Please Sign encounter. I can do close it since it is a controlled substance request.

## 2023-10-26 ENCOUNTER — Ambulatory Visit: Payer: Medicaid Other

## 2023-10-26 ENCOUNTER — Other Ambulatory Visit: Payer: Self-pay | Admitting: Family

## 2023-10-26 DIAGNOSIS — G8929 Other chronic pain: Secondary | ICD-10-CM

## 2023-10-30 ENCOUNTER — Other Ambulatory Visit: Payer: Self-pay | Admitting: Thoracic Surgery (Cardiothoracic Vascular Surgery)

## 2023-10-30 DIAGNOSIS — J849 Interstitial pulmonary disease, unspecified: Secondary | ICD-10-CM

## 2023-11-02 ENCOUNTER — Ambulatory Visit (INDEPENDENT_AMBULATORY_CARE_PROVIDER_SITE_OTHER): Payer: Self-pay | Admitting: Thoracic Surgery (Cardiothoracic Vascular Surgery)

## 2023-11-02 VITALS — BP 98/60 | HR 76 | Resp 18 | Ht 60.0 in | Wt 121.0 lb

## 2023-11-02 DIAGNOSIS — J849 Interstitial pulmonary disease, unspecified: Secondary | ICD-10-CM

## 2023-11-02 DIAGNOSIS — Z9889 Other specified postprocedural states: Secondary | ICD-10-CM

## 2023-11-02 DIAGNOSIS — J449 Chronic obstructive pulmonary disease, unspecified: Secondary | ICD-10-CM | POA: Diagnosis not present

## 2023-11-02 DIAGNOSIS — R32 Unspecified urinary incontinence: Secondary | ICD-10-CM | POA: Diagnosis not present

## 2023-11-02 MED ORDER — TRAMADOL HCL 50 MG PO TABS: 50.0000 mg | ORAL_TABLET | Freq: Four times a day (QID) | ORAL | 0 refills | Status: AC | PRN

## 2023-11-02 NOTE — Progress Notes (Signed)
      [address]       [place] [address]             [phone]        [name] [facility] Medical Record #[id number] Date of Birth: [birth year]  Referring: Geronimo Amel, MD Primary Care: Lorren Greig PARAS, NP Primary Cardiologist:Philip Nahser, MD  Reason for visit:   follow-up  History of Present Illness:     58yo female presents for their first follow-up appointment.  Overall, she is doing well.    Physical Exam: BP 98/60 (BP Location: Left Arm, Cuff Size: Normal)   Pulse 76   Resp 18   Ht 5' (1.524 m)   Wt 121 lb (54.9 kg)   SpO2 96% Comment: RA  BMI 23.63 kg/m   Alert NAD Incision clean.   Abdomen, ND no peripheral edema   Diagnostic Studies & Laboratory data:  Path:  FINAL MICROSCOPIC DIAGNOSIS:   A. LUNG, RIGHT UPPER LOBE, WEDGE RESECTION:  - Interstitial fibrosing and chronic inflammatory process, see comment   B. LUNG, RIGHT MIDDLE LOBE, WEDGE RESECTION:  - Lung parenchyma with intra-alveolar macrophages, emphysematous changes  and minimal to mild interstitial fibrosis, see comment   C. LUNG, RIGHT LOWER LOBE, WEDGE RESECTION:  - Patchy mild interstitial fibrosing and chronic inflammatory process,  see comment     Assessment / Plan:   58yo female s/p wedge resection for interstitial lung disease.  She is scheduled to follow-up with her pulmonologist in the coming month.  I we will see her back in 1 month follow-up with a CXR.     [name] 11/02/2023 3:50 PM

## 2023-11-06 ENCOUNTER — Encounter: Payer: Self-pay | Admitting: Family

## 2023-11-06 ENCOUNTER — Encounter: Payer: Medicaid Other | Admitting: Family

## 2023-11-06 NOTE — Progress Notes (Signed)
 Erroneous encounter-disregard

## 2023-11-08 NOTE — Telephone Encounter (Signed)
 Schedule appointment?

## 2023-11-09 ENCOUNTER — Ambulatory Visit (HOSPITAL_COMMUNITY): Payer: Medicaid Other

## 2023-11-14 ENCOUNTER — Encounter: Payer: Self-pay | Admitting: Family

## 2023-11-14 ENCOUNTER — Other Ambulatory Visit: Payer: Self-pay | Admitting: Family Medicine

## 2023-11-14 ENCOUNTER — Telehealth: Payer: Self-pay | Admitting: *Deleted

## 2023-11-14 ENCOUNTER — Other Ambulatory Visit: Payer: Self-pay | Admitting: Internal Medicine

## 2023-11-14 ENCOUNTER — Other Ambulatory Visit: Payer: Self-pay | Admitting: Family

## 2023-11-14 DIAGNOSIS — G8929 Other chronic pain: Secondary | ICD-10-CM

## 2023-11-14 DIAGNOSIS — E785 Hyperlipidemia, unspecified: Secondary | ICD-10-CM

## 2023-11-14 NOTE — Telephone Encounter (Signed)
 Schedule appointment?

## 2023-11-14 NOTE — Telephone Encounter (Signed)
 Patient contacted the office requesting a refill of pain medication. Per patient, she has come down with a respiratory illness and has been coughing and sneezing. This has caused her pain. Discussed refill with E. Barrett, PA, and advised patient to take Tylenol  at this time for pain. Patient verbalized understanding.

## 2023-11-15 ENCOUNTER — Ambulatory Visit (INDEPENDENT_AMBULATORY_CARE_PROVIDER_SITE_OTHER): Payer: Medicaid Other | Admitting: Family

## 2023-11-15 ENCOUNTER — Ambulatory Visit: Payer: Medicaid Other | Admitting: Physician Assistant

## 2023-11-15 VITALS — BP 139/76 | HR 88 | Temp 98.1°F | Resp 16 | Wt 123.6 lb

## 2023-11-15 DIAGNOSIS — Z716 Tobacco abuse counseling: Secondary | ICD-10-CM | POA: Diagnosis not present

## 2023-11-15 DIAGNOSIS — Z09 Encounter for follow-up examination after completed treatment for conditions other than malignant neoplasm: Secondary | ICD-10-CM | POA: Diagnosis not present

## 2023-11-15 DIAGNOSIS — J849 Interstitial pulmonary disease, unspecified: Secondary | ICD-10-CM

## 2023-11-15 MED ORDER — BREZTRI AEROSPHERE 160-9-4.8 MCG/ACT IN AERO: 2.0000 | INHALATION_SPRAY | Freq: Two times a day (BID) | RESPIRATORY_TRACT | 11 refills | Status: DC

## 2023-11-15 MED ORDER — NICOTINE 14 MG/24HR TD PT24: 14.0000 mg | MEDICATED_PATCH | Freq: Every day | TRANSDERMAL | 2 refills | Status: AC

## 2023-11-15 MED ORDER — TRAMADOL HCL 50 MG PO TABS: 50.0000 mg | ORAL_TABLET | Freq: Three times a day (TID) | ORAL | 0 refills | Status: AC | PRN

## 2023-11-15 NOTE — Progress Notes (Signed)
Patient would like to go down on her smoking patches. Patient said that she is doing great. Patient has no other concerns

## 2023-11-15 NOTE — Progress Notes (Signed)
Patient ID: [name], female    DOB: 12-11-1965  MRN: [id number]  CC: Hospital Discharge Follow-Up  Subjective: [name] is a 58 y.o. female who presents for hospital discharge follow-up.  Her concerns today include:  10/09/2023 - 10/11/2023 [facility] per MD note:  Discharge Diagnoses:  Principal Problem:   ILD (interstitial lung disease) [facility]) Active Problems:   Interstitial lung disease [facility])    Hospital Course: [name] presented to [facility] and was brought to the operating room on 10/09/23. She underwent robotic assisted right wedge resection of the right upper, middle and lower lobes. She tolerated the procedure well, was extubated and transferred to the PACU in stable condition.    Postoperative hospital course:   The patient is remained stable.  She initially had some difficulty with pain and medications were adjusted.  She had a small air leak on postop day 1 chest tube was kept in place.  Oxygen is being weaned and she is maintaining adequate saturations.  Blood loss from surgery was minimal and she is noted not to be anemic.  Renal function has remained within normal limits.  Incisions are healing well without evidence of infection.  On postop day 2 her chest tube was removed and she showed no evidence of airleak and chest x-ray remained stable.  Blood pressure remains relatively low but this is a chronic condition for her.  She is on multiple medications that could potentially be playing a part.  Will continue to be monitored by her primary care and cardiology/pulmonology.  Final pathology remains pending.  She was tolerating activity without significant difficulty and felt to be stable for discharge on postop day #2.   Follow-Ups  Follow up with Corliss Skains, MD (Cardiothoracic Surgery); Please see discharge paperwork for details of follow-up appointment with Dr. Cliffton Asters. Jan. 3, 2025 @10 :40 AM Follow up with [facility] (Radiology)  Today's office visit 11/15/2023: - Pain persisting since hospital discharge. Established with Cardiothoracic Surgery. States Cardiothoracic Surgery initially prescribed her Oxycodone to help with pain and then was moved down to Tramadol. States Cardiothoracic Surgery told her they would not refill Tramadol and to come to Primary Care for 5 days refill of Tramadol. - States she is no longer smoking and would like to move from 21 mg nicotine patch to 14 mg due to higher dose causing nausea.  - No further issues/concerns for discussion today.   Patient Active Problem List   Diagnosis Date Noted   Interstitial lung disease (HCC) 10/09/2023   Iron deficiency 12/30/2022   Vitamin D deficiency 08/19/2022   Urinary incontinence 04/09/2022   ILD (interstitial lung disease) (HCC) 06/27/2021   Unstable angina (HCC) 04/13/2021   Atypical nevi 03/17/2021   Atherosclerosis 03/08/2021   Grief reaction 03/02/2021   Acute diarrhea 12/29/2020   Acute right lumbar radiculopathy 12/11/2020   Encounter for smoking cessation counseling 12/11/2020   Acute lumbar back pain 07/25/2020   COPD (chronic obstructive pulmonary disease) (HCC) 06/01/2020   GAD (generalized anxiety disorder) 12/26/2019   Poor social situation 08/29/2019   Abnormal chest CT 08/29/2019   Poor dentition 06/25/2019   Lung nodule, solitary 06/25/2019   Chronic cough 11/26/2018   Recent unintentional weight loss over several months 11/05/2018   Current smoker 11/05/2018   Paresthesia 07/22/2018   Skull deformity 07/22/2018   Hyperlipidemia 07/22/2018   MDD (major depressive disorder) 01/09/2012   Migraine with aura 05/09/2007   Mitral  valve prolapse 05/09/2007     Current Outpatient Medications on File Prior to Visit  Medication Sig Dispense Refill   albuterol (PROVENTIL) (2.5 MG/3ML) 0.083% nebulizer solution INHALE 3 ML BY NEBULIZATION EVERY 6 HOURS AS NEEDED FOR WHEEZING OR SHORTNESS OF  BREATH 75 mL 5   aspirin 81 MG chewable tablet Chew 81 mg by mouth daily.     Budeson-Glycopyrrol-Formoterol (BREZTRI AEROSPHERE) 160-9-4.8 MCG/ACT AERO Inhale 2 puffs into the lungs in the morning and at bedtime.     busPIRone (BUSPAR) 15 MG tablet Take 1 tablet (15 mg total) by mouth 2 (two) times daily.     gabapentin (NEURONTIN) 300 MG capsule TAKE 1 CAPSULE BY MOUTH TWICE A DAY 60 capsule 2   linaclotide (LINZESS) 145 MCG CAPS capsule Take 1 capsule (145 mcg total) by mouth daily as needed (Constipation).     methocarbamol (ROBAXIN) 500 MG tablet Take 1 tablet (500 mg total) by mouth every 8 (eight) hours as needed for muscle spasms. 12 tablet 0   nitroGLYCERIN (NITROSTAT) 0.4 MG SL tablet Place 1 tablet (0.4 mg total) under the tongue every 5 (five) minutes as needed for chest pain. 25 tablet 1   omeprazole (PRILOSEC) 20 MG capsule TAKE 1 CAPSULE BY MOUTH EVERY DAY 90 capsule 1   ondansetron (ZOFRAN-ODT) 4 MG disintegrating tablet Take 1 tablet (4 mg total) by mouth daily.     oxyCODONE (OXY IR/ROXICODONE) 5 MG immediate release tablet Take 1 tablet (5 mg total) by mouth every 6 (six) hours as needed for severe pain (pain score 7-10). 28 tablet 0   ranolazine (RANEXA) 500 MG 12 hr tablet Take 1 tablet (500 mg total) by mouth 2 (two) times daily. 180 tablet 3   rosuvastatin (CRESTOR) 40 MG tablet TAKE 1 TABLET BY MOUTH EVERY DAY 90 tablet 0   SUMAtriptan (IMITREX) 6 MG/0.5ML SOLN injection Inject 0.5 mLs (6 mg total) into the skin every 2 (two) hours as needed for migraine or headache. May repeat in 2 hours if headache persists or recurs.  Limit to twice per week. 0.5 mL 8   traZODone (DESYREL) 50 MG tablet Take 1 tablet (50 mg total) by mouth at bedtime.     VENTOLIN HFA 108 (90 Base) MCG/ACT inhaler INHALE 1-2 PUFFS BY MOUTH EVERY 6 HOURS AS NEEDED FOR WHEEZE OR SHORTNESS OF BREATH 18 each 2   Vitamin D, Ergocalciferol, (DRISDOL) 1.25 MG (50000 UNIT) CAPS capsule Take 1 capsule (50,000 Units  total) by mouth once a week. 5 capsule 0   ezetimibe (ZETIA) 10 MG tablet Take 1 tablet (10 mg total) by mouth daily. 90 tablet 3   No current facility-administered medications on file prior to visit.    Allergies  Allergen Reactions   Dextromethorphan Other (See Comments)    keeps awake, legs constantly moving    Doxylamine Other (See Comments)    keeps awake, legs constantly moving    Pseudoeph-Doxylamine-Dm-Apap Other (See Comments)    REACTION: keeps awake, legs constantly moving  NYQUIL   Pseudoephedrine Hcl Other (See Comments)    keeps awake, legs constantly moving     Social History   Socioeconomic History   Marital status: Divorced    Spouse name: Not on file   Number of children: 2   Years of education: 12+2   Highest education level: Associate degree: occupational, Scientist, product/process development, or vocational program  Occupational History    Comment: Disabled  Tobacco Use   Smoking status: Former    Current packs/day:  1.00    Average packs/day: 1 pack/day for 34.0 years (34.0 ttl pk-yrs)    Types: Cigarettes    Passive exposure: Current   Smokeless tobacco: Never   Tobacco comments:    0.5-1ppd as of 07/03/23 Tay  Vaping Use   Vaping status: Never Used  Substance and Sexual Activity   Alcohol use: Yes    Comment: 1-2 times monthly   Drug use: No   Sexual activity: Yes    Partners: Male    Birth control/protection: Surgical  Other Topics Concern   Not on file  Social History Narrative   ** Merged History Encounter **       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  Trying to get disability. Lives with mom.  Highest level of education:  High school   Social Drivers of Health   Financial Resource Strain: Low Risk  (03/21/2023)   Overall Financial Resource Strain (CARDIA)    Difficulty of Paying Living Expenses: Not very hard  Food Insecurity: No Food Insecurity (10/09/2023)   Hunger Vital Sign    Worried About Running Out of Food in the Last Year: Never  true    Ran Out of Food in the Last Year: Never true  Transportation Needs: No Transportation Needs (10/09/2023)   PRAPARE - Administrator, Civil Service (Medical): No    Lack of Transportation (Non-Medical): No  Physical Activity: Unknown (03/21/2023)   Exercise Vital Sign    Days of Exercise per Week: 0 days    Minutes of Exercise per Session: Not on file  Stress: Stress Concern Present (03/21/2023)   [employer] of Occupational Health - Occupational Stress Questionnaire    Feeling of Stress : Rather much  Social Connections: Socially Isolated (03/21/2023)   Social Connection and Isolation Panel [NHANES]    Frequency of Communication with Friends and Family: Once a week    Frequency of Social Gatherings with Friends and Family: Once a week    Attends Religious Services: Never    Database administrator or Organizations: No    Attends Engineer, structural: Not on file    Marital Status: Divorced  Intimate Partner Violence: Not At Risk (10/09/2023)   Humiliation, Afraid, Rape, and Kick questionnaire    Fear of Current or Ex-Partner: No    Emotionally Abused: No    Physically Abused: No    Sexually Abused: No    Family History  Problem Relation Age of Onset   Diabetes Mother    Heart disease Mother    Hyperlipidemia Mother    Colon polyps Mother    Tuberculosis Father    Alcohol abuse Father    Heart disease Sister    Hyperlipidemia Sister    Heart disease Brother    Hyperlipidemia Brother    Diabetes Maternal Grandmother    Diabetes Maternal Grandfather    Colon cancer Maternal Uncle    Stomach cancer Maternal Uncle     Past Surgical History:  Procedure Laterality Date   ANKLE SURGERY     RIGHT   APPENDECTOMY     AUGMENTATION MAMMAPLASTY     BREAST ENHANCEMENT SURGERY     SALINE   BUNIONECTOMY     BILATERAL FEET   INTERCOSTAL NERVE BLOCK Right 10/09/2023   Procedure: INTERCOSTAL NERVE BLOCK;  Surgeon: Corliss Skains, MD;   Location: [facility] OR;  Service: Thoracic;  Laterality: Right;   LEFT HEART CATH AND CORONARY ANGIOGRAPHY N/A 04/13/2021  Procedure: LEFT HEART CATH AND CORONARY ANGIOGRAPHY;  Surgeon: Marykay Lex, MD;  Location: [facility] INVASIVE CV LAB;  Service: Cardiovascular;  Laterality: N/A;   TOOTH EXTRACTION N/A 07/11/2021   Procedure: DENTAL RESTORATION / EXTRACTIONS x 13;  Surgeon: Ocie Doyne, DMD;  Location: [facility];  Service: Oral Surgery;  Laterality: N/A;   TOTAL ABDOMINAL HYSTERECTOMY W/ BILATERAL SALPINGOOPHORECTOMY     TVT  10/30/2005   WISDOM TOOTH EXTRACTION     X 4    ROS: Review of Systems Negative except as stated above  PHYSICAL EXAM: BP 139/76   Pulse 88   Temp 98.1 F (36.7 C) (Oral)   Resp 16   Wt 123 lb 9.6 oz (56.1 kg)   SpO2 95%   BMI 24.14 kg/m   Physical Exam HENT:     Head: Normocephalic and atraumatic.     Nose: Nose normal.     Mouth/Throat:     Mouth: Mucous membranes are moist.     Pharynx: Oropharynx is clear.  Eyes:     Extraocular Movements: Extraocular movements intact.     Conjunctiva/sclera: Conjunctivae normal.     Pupils: Pupils are equal, round, and reactive to light.  Cardiovascular:     Rate and Rhythm: Normal rate and regular rhythm.     Pulses: Normal pulses.     Heart sounds: Normal heart sounds.  Pulmonary:     Effort: Pulmonary effort is normal.     Breath sounds: Normal breath sounds.  Musculoskeletal:        General: Normal range of motion.     Cervical back: Normal range of motion and neck supple.  Neurological:     General: No focal deficit present.     Mental Status: She is alert and oriented to person, place, and time.  Psychiatric:        Mood and Affect: Mood normal.        Behavior: Behavior normal.     ASSESSMENT AND PLAN: 1. Hospital discharge follow-up (Primary) - Reviewed hospital course, current medications, ensured proper follow-up in place, and addressed concerns.   2. ILD (interstitial lung  disease) (HCC) - Tramadol as prescribed. Counseled on medication adherence/adverse effects.  - I did check the [facility] prescription drug database.  - Keep all scheduled appointments with established Cardiothoracic Surgery. - traMADol (ULTRAM) 50 MG tablet; Take 1 tablet (50 mg total) by mouth every 8 (eight) hours as needed for up to 5 days.  Dispense: 15 tablet; Refill: 0  3. Encounter for smoking cessation counseling - Decrease Nicotine patch from 21 mg to 14 mg as prescribed. Counseled on medication adherence/adverse effects. - Follow-up with primary provider in 4 weeks or sooner if needed.  - nicotine (NICODERM CQ) 14 mg/24hr patch; Place 1 patch (14 mg total) onto the skin daily.  Dispense: 30 patch; Refill: 2    Patient was given the opportunity to ask questions.  Patient verbalized understanding of the plan and was able to repeat key elements of the plan. Patient was given clear instructions to go to Emergency Department or return to medical center if symptoms don't improve, worsen, or new problems develop.The patient verbalized understanding.    Requested Prescriptions   Signed Prescriptions Disp Refills   traMADol (ULTRAM) 50 MG tablet 15 tablet 0    Sig: Take 1 tablet (50 mg total) by mouth every 8 (eight) hours as needed for up to 5 days.   nicotine (NICODERM CQ) 14 mg/24hr patch 30  patch 2    Sig: Place 1 patch (14 mg total) onto the skin daily.    Follow-up with primary provider as scheduled.  [name], NP

## 2023-11-16 ENCOUNTER — Telehealth: Payer: Self-pay

## 2023-11-16 ENCOUNTER — Other Ambulatory Visit (HOSPITAL_COMMUNITY): Payer: Self-pay

## 2023-11-16 NOTE — Telephone Encounter (Signed)
Pharmacy Patient Advocate Encounter  Received notification from [facility] that Prior Authorization for [name] has been APPROVED from 11/16/2023 to 11/14/2024. Ran test claim, Copay is $4.00. This test claim was processed through [facility]- copay amounts may vary at other pharmacies due to pharmacy/plan contracts, or as the patient moves through the different stages of their insurance plan.

## 2023-11-16 NOTE — Telephone Encounter (Signed)
*  Pulm  Pharmacy Patient Advocate Encounter   Received notification from CoverMyMeds that prior authorization for Breztri Aerosphere 160-9-4.8MCG/ACT aerosol  is required/requested.   Insurance verification completed.   The patient is insured through [facility] .   Per test claim: PA required; PA submitted to above mentioned insurance via CoverMyMeds Key/confirmation #/EOC [id number] Status is pending

## 2023-11-27 ENCOUNTER — Ambulatory Visit: Payer: Medicaid Other

## 2023-11-27 ENCOUNTER — Ambulatory Visit: Payer: Medicaid Other | Admitting: Family

## 2023-11-28 ENCOUNTER — Ambulatory Visit: Payer: Medicaid Other | Admitting: Physician Assistant

## 2023-11-29 ENCOUNTER — Ambulatory Visit (HOSPITAL_COMMUNITY): Payer: Medicaid Other | Attending: Cardiology

## 2023-11-29 DIAGNOSIS — E785 Hyperlipidemia, unspecified: Secondary | ICD-10-CM | POA: Diagnosis not present

## 2023-11-29 DIAGNOSIS — I6523 Occlusion and stenosis of bilateral carotid arteries: Secondary | ICD-10-CM

## 2023-11-29 DIAGNOSIS — I2510 Atherosclerotic heart disease of native coronary artery without angina pectoris: Secondary | ICD-10-CM | POA: Diagnosis not present

## 2023-11-29 DIAGNOSIS — R079 Chest pain, unspecified: Secondary | ICD-10-CM | POA: Diagnosis not present

## 2023-11-29 DIAGNOSIS — I341 Nonrheumatic mitral (valve) prolapse: Secondary | ICD-10-CM

## 2023-11-29 LAB — ECHOCARDIOGRAM COMPLETE
Area-P 1/2: 3.39 cm2
S' Lateral: 3.2 cm

## 2023-12-03 ENCOUNTER — Encounter: Payer: Self-pay | Admitting: Cardiovascular Disease

## 2023-12-03 ENCOUNTER — Telehealth: Payer: Self-pay | Admitting: Cardiovascular Disease

## 2023-12-03 DIAGNOSIS — J449 Chronic obstructive pulmonary disease, unspecified: Secondary | ICD-10-CM | POA: Diagnosis not present

## 2023-12-03 DIAGNOSIS — R32 Unspecified urinary incontinence: Secondary | ICD-10-CM | POA: Diagnosis not present

## 2023-12-03 NOTE — Telephone Encounter (Signed)
Patient is returning RN's call for her echo results. She is requesting a callback after 4 pm.   Please advise.

## 2023-12-03 NOTE — Telephone Encounter (Signed)
Spoke with patient and discussed echo results per Ronie Spies, PA-C:  Please let pt know heart remains strong. There is continued evidence of mitral valve prolapse where there is some leaking of the mitral valve in the mild-moderate range. I anticipate repeat echo in 1 year for surveillance but would keep f/u as scheduled to discuss. Overall no acute concerning findings. She did not return to our office for EKG after starting Ranexa, cancelled nurse visit. If she is still taking Ranexa, would try to see if we can get her in for this next available nurse visit in the meantime. Otherwise keep APP visit in March.    Patient verbalized understanding and expressed appreciation for call. Nurse Visit scheduled for 2/7 at 11am for EKG to F/U after starting Ranexa as recommended by Dayna.  Patient requested refill on Crestor, reorder pended from PCP who is currently prescribing.

## 2023-12-06 ENCOUNTER — Ambulatory Visit: Payer: Medicaid Other

## 2023-12-07 ENCOUNTER — Ambulatory Visit: Payer: Medicaid Other

## 2023-12-11 ENCOUNTER — Telehealth: Payer: Self-pay | Admitting: Family

## 2023-12-14 ENCOUNTER — Other Ambulatory Visit: Payer: Self-pay | Admitting: Family

## 2023-12-14 DIAGNOSIS — R110 Nausea: Secondary | ICD-10-CM

## 2023-12-14 NOTE — Telephone Encounter (Signed)
Requested medication (s) are due for refill today: na   Requested medication (s) are on the active medication list: yes   Last refill:  10/11/23  Future visit scheduled: no   Notes to clinic:  not delegated per protocol. Last ordered by Gershon Crane, PA. Do you want to order Rx?     Requested Prescriptions  Pending Prescriptions Disp Refills   ondansetron (ZOFRAN-ODT) 4 MG disintegrating tablet      Sig: Take 1 tablet (4 mg total) by mouth daily.     Not Delegated - Gastroenterology: Antiemetics - ondansetron Failed - 12/14/2023 12:54 PM      Failed - This refill cannot be delegated      Failed - AST in normal range and within 360 days    AST  Date Value Ref Range Status  10/11/2023 52 (H) 15 - 41 U/L Final         Passed - ALT in normal range and within 360 days    ALT  Date Value Ref Range Status  10/11/2023 15 0 - 44 U/L Final         Passed - Valid encounter within last 6 months    Recent Outpatient Visits           4 weeks ago Hospital discharge follow-up   [facility], Amy J, NP   6 months ago Acute conjunctivitis of right eye, unspecified acute conjunctivitis type   [place] [facility], MD   7 months ago Anxiety and depression   [place] [facility], Amy J, NP   8 months ago COPD (chronic obstructive pulmonary disease) with acute bronchitis (HCC)   [place] [facility] at [facility], MD   12 months ago History of anemia    Primary Care at [facility], [place], NP       Future Appointments             In 3 weeks Swinyer, Zachary George, NP [facility]

## 2023-12-14 NOTE — Telephone Encounter (Signed)
 Copied from CRM [id number]. Topic: Clinical - Medication Refill >> Dec 14, 2023 10:18 AM Fuller Mandril wrote: Most Recent Primary Care Visit:  Provider: Ricky Stabs J  Department: [facility]  Visit Type: OFFICE VISIT  Date: 11/15/2023  Medication:  rosuvastatin (CRESTOR) 40 MG tablet ondansetron (ZOFRAN-ODT) 4 MG disintegrating tablet - for Nausea  Has the patient contacted their pharmacy? Yes (Agent: If no, request that the patient contact the pharmacy for the refill. If patient does not wish to contact the pharmacy document the reason why and proceed with request.) (Agent: If yes, when and what did the pharmacy advise?) pharmacy says contact provider  Is this the correct pharmacy for this prescription? Yes If no, delete pharmacy and type the correct one.  This is the patient's preferred pharmacy:  [facility] [address] [place] - [address] [place] [address] Phone: [phone] Fax: [phone]   Has the prescription been filled recently? No  Is the patient out of the medication? Yes  Has the patient been seen for an appointment in the last year OR does the patient have an upcoming appointment? Yes  11/15/2023  Can we respond through MyChart? Yes  Agent: Please be advised that Rx refills may take up to 3 business days. We ask that you follow-up with your pharmacy.

## 2023-12-21 NOTE — Progress Notes (Deleted)
 [address]       [place] [address]             [phone]   HPI: This is a 58 year old female who is s/p robotic assisted right video thoracoscopy, wedge resection of the upper, middle and lower lobes, and intercostal nerve block by Dr. Cliffton Asters on 10/09/2023. 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 She was discharged on 10/12/2023. She presents today for routine post op follow up.   Current Outpatient Medications  Medication Sig Dispense Refill   albuterol (PROVENTIL) (2.5 MG/3ML) 0.083% nebulizer solution INHALE 3 ML BY NEBULIZATION EVERY 6 HOURS AS NEEDED FOR WHEEZING OR SHORTNESS OF BREATH 75 mL 5   aspirin 81 MG chewable tablet Chew 81 mg by mouth daily.     Budeson-Glycopyrrol-Formoterol (BREZTRI AEROSPHERE) 160-9-4.8 MCG/ACT AERO Inhale 2 puffs into the lungs in the morning and at bedtime. 10.7 g 11   busPIRone (BUSPAR) 15 MG tablet Take 1 tablet (15 mg total) by mouth 2 (two) times daily.     ezetimibe (ZETIA) 10 MG tablet Take 1 tablet (10 mg total) by mouth daily. 90 tablet 3   gabapentin (NEURONTIN) 300 MG capsule TAKE 1 CAPSULE BY MOUTH TWICE A DAY 60 capsule 2   linaclotide (LINZESS) 145 MCG CAPS capsule Take 1 capsule (145 mcg total) by mouth daily as needed (Constipation).     methocarbamol (ROBAXIN) 500 MG tablet Take 1 tablet (500 mg total) by mouth every 8 (eight) hours as needed for muscle spasms. 12 tablet 0   nicotine (NICODERM CQ) 14 mg/24hr patch Place 1 patch (14 mg total) onto the skin daily. 30 patch 2   nitroGLYCERIN (NITROSTAT) 0.4 MG SL tablet Place 1 tablet (0.4 mg total) under the tongue every 5 (five) minutes as needed for chest pain. 25 tablet 1   omeprazole (PRILOSEC) 20 MG capsule TAKE 1 CAPSULE BY MOUTH EVERY DAY 90 capsule 1   ondansetron (ZOFRAN-ODT)  4 MG disintegrating tablet Take 1 tablet (4 mg total) by mouth daily.     oxyCODONE (OXY IR/ROXICODONE) 5 MG immediate release tablet Take 1 tablet (5 mg total) by mouth every 6 (six) hours as needed for severe pain (pain score 7-10). 28 tablet 0   ranolazine (RANEXA) 500 MG 12 hr tablet Take 1 tablet (500 mg total) by mouth 2 (two) times daily. 180 tablet 3   rosuvastatin (CRESTOR) 40 MG tablet TAKE 1 TABLET BY MOUTH EVERY DAY 90 tablet 0   SUMAtriptan (IMITREX) 6 MG/0.5ML SOLN injection Inject 0.5 mLs (6 mg total) into the skin every 2 (two) hours as needed for migraine or headache. May repeat in 2 hours if headache persists or recurs.  Limit to twice per week. 0.5 mL 8   traZODone (DESYREL) 50 MG tablet Take 1 tablet (50 mg total) by mouth at bedtime.     VENTOLIN HFA 108 (90 Base) MCG/ACT inhaler INHALE 1-2 PUFFS BY MOUTH EVERY 6 HOURS AS NEEDED FOR WHEEZE OR SHORTNESS OF BREATH 18 each 2   Vitamin D, Ergocalciferol, (DRISDOL) 1.25 MG (50000 UNIT) CAPS capsule Take 1 capsule (50,000 Units total) by mouth once a week. 5 capsule 0  Vital Signs:   Physical Exam: CV Pulmonary Extremities Wounds  Diagnostic Tests:   Impression and Plan: We reviewed today's chest x ray. We discussed she  may begin driving as long as she is not taking any narcotic for pain. She may return to her activities as done prior to surgery. She states she is ** smoking and was encouraged to continue cessation. She has a follow up to see pulmonologist on 03/04.  Ardelle Balls, PA-C [facility] [phone]

## 2023-12-24 ENCOUNTER — Telehealth: Payer: Medicaid Other | Admitting: Family Medicine

## 2023-12-24 DIAGNOSIS — R6889 Other general symptoms and signs: Secondary | ICD-10-CM

## 2023-12-24 MED ORDER — OSELTAMIVIR PHOSPHATE 75 MG PO CAPS: 75.0000 mg | ORAL_CAPSULE | Freq: Two times a day (BID) | ORAL | 0 refills | Status: AC

## 2023-12-24 NOTE — Patient Instructions (Addendum)
 [name], thank you for joining Freddy Finner, NP for today's virtual visit.  While this provider is not your primary care provider (PCP), if your PCP is located in our provider database this encounter information will be shared with them immediately following your visit.   A [facility] MyChart account gives you access to today's visit and all your visits, tests, and labs performed at [facility] " click here if you don't have a [facility] MyChart account or go to [email or web address]  Consent: (Patient) [name] provided verbal consent for this virtual visit at the beginning of the encounter.  Current Medications:  Current Outpatient Medications:    oseltamivir (TAMIFLU) 75 MG capsule, Take 1 capsule (75 mg total) by mouth 2 (two) times daily for 5 days., Disp: 10 capsule, Rfl: 0   albuterol (PROVENTIL) (2.5 MG/3ML) 0.083% nebulizer solution, INHALE 3 ML BY NEBULIZATION EVERY 6 HOURS AS NEEDED FOR WHEEZING OR SHORTNESS OF BREATH, Disp: 75 mL, Rfl: 5   aspirin 81 MG chewable tablet, Chew 81 mg by mouth daily., Disp: , Rfl:    Budeson-Glycopyrrol-Formoterol (BREZTRI AEROSPHERE) 160-9-4.8 MCG/ACT AERO, Inhale 2 puffs into the lungs in the morning and at bedtime., Disp: 10.7 g, Rfl: 11   busPIRone (BUSPAR) 15 MG tablet, Take 1 tablet (15 mg total) by mouth 2 (two) times daily., Disp: , Rfl:    ezetimibe (ZETIA) 10 MG tablet, Take 1 tablet (10 mg total) by mouth daily., Disp: 90 tablet, Rfl: 3   gabapentin (NEURONTIN) 300 MG capsule, TAKE 1 CAPSULE BY MOUTH TWICE A DAY, Disp: 60 capsule, Rfl: 2   linaclotide (LINZESS) 145 MCG CAPS capsule, Take 1 capsule (145 mcg total) by mouth daily as needed (Constipation)., Disp: , Rfl:    methocarbamol (ROBAXIN) 500 MG tablet, Take 1 tablet (500 mg total) by mouth every 8 (eight) hours as needed for muscle spasms., Disp: 12 tablet, Rfl: 0   nicotine (NICODERM CQ) 14 mg/24hr patch, Place 1 patch (14 mg total) onto the skin daily.,  Disp: 30 patch, Rfl: 2   nitroGLYCERIN (NITROSTAT) 0.4 MG SL tablet, Place 1 tablet (0.4 mg total) under the tongue every 5 (five) minutes as needed for chest pain., Disp: 25 tablet, Rfl: 1   omeprazole (PRILOSEC) 20 MG capsule, TAKE 1 CAPSULE BY MOUTH EVERY DAY, Disp: 90 capsule, Rfl: 1   ondansetron (ZOFRAN-ODT) 4 MG disintegrating tablet, Take 1 tablet (4 mg total) by mouth daily., Disp: , Rfl:    oxyCODONE (OXY IR/ROXICODONE) 5 MG immediate release tablet, Take 1 tablet (5 mg total) by mouth every 6 (six) hours as needed for severe pain (pain score 7-10)., Disp: 28 tablet, Rfl: 0   ranolazine (RANEXA) 500 MG 12 hr tablet, Take 1 tablet (500 mg total) by mouth 2 (two) times daily., Disp: 180 tablet, Rfl: 3   rosuvastatin (CRESTOR) 40 MG tablet, TAKE 1 TABLET BY MOUTH EVERY DAY, Disp: 90 tablet, Rfl: 0   SUMAtriptan (IMITREX) 6 MG/0.5ML SOLN injection, Inject 0.5 mLs (6 mg total) into the skin every 2 (two) hours as needed for migraine or headache. May repeat in 2 hours if headache persists or recurs.  Limit to twice per week., Disp: 0.5 mL, Rfl: 8   traZODone (DESYREL) 50 MG tablet, Take 1 tablet (50 mg total) by mouth at bedtime., Disp: , Rfl:    VENTOLIN HFA 108 (90 Base) MCG/ACT inhaler, INHALE 1-2 PUFFS BY MOUTH EVERY 6 HOURS AS NEEDED FOR WHEEZE OR SHORTNESS OF BREATH, Disp:  18 each, Rfl: 2   Vitamin D, Ergocalciferol, (DRISDOL) 1.25 MG (50000 UNIT) CAPS capsule, Take 1 capsule (50,000 Units total) by mouth once a week., Disp: 5 capsule, Rfl: 0   Medications ordered in this encounter:  Meds ordered this encounter  Medications   oseltamivir (TAMIFLU) 75 MG capsule    Sig: Take 1 capsule (75 mg total) by mouth 2 (two) times daily for 5 days.    Dispense:  10 capsule    Refill:  0    Supervising Provider:   Merrilee Jansky [[id number]]     *If you need refills on other medications prior to your next appointment, please contact your pharmacy*  Follow-Up: Call back or seek an in-person  evaluation if the symptoms worsen or if the condition fails to improve as anticipated.   Virtual Care [phone]  Other Instructions  Influenza, Adult Influenza is also called the flu. It's an infection that affects your respiratory tract. This includes your nose, throat, windpipe, and lungs. The flu is contagious. This means it spreads easily from person to person. It causes symptoms that are like a cold. It can also cause a high fever and body aches. What are the causes? The flu is caused by the influenza virus. You can get it by: Breathing in droplets that are in the air after an infected person coughs or sneezes. Touching something that has the virus on it and then touching your mouth, nose, or eyes. What increases the risk? You may be more likely to get the flu if: You don't wash your hands often. You're near a lot of people during cold and flu season. You touch your mouth, eyes, or nose without washing your hands first. You don't get a flu shot each year. You may also be more at risk for the flu and serious problems, such as a lung infection called pneumonia, if: You're older than 65. You're pregnant. Your immune system is weak. Your immune system is your body's defense system. You have a long-term, or chronic, condition, such as: Heart, kidney, or lung disease. Diabetes. A liver disorder. Asthma. You're very overweight. You have anemia. This is when you don't have enough red blood cells in your body. What are the signs or symptoms? Flu symptoms often start all of a sudden. They may last 4-14 days and include: Fever and chills. Headaches, body aches, or muscle aches. Sore throat. Cough. Runny or stuffy nose. Discomfort in your chest. Not wanting to eat as much as normal. Feeling weak or tired. Feeling dizzy. Nausea or vomiting. How is this diagnosed? The flu may be diagnosed based on your symptoms and medical history. You may also have a physical exam.  A swab may be taken from your nose or throat and tested for the virus. How is this treated? If the flu is found early, you can be treated with antiviral medicine. This may be given to you by mouth or through an IV. It can help you feel less sick and get better faster. Taking care of yourself at home can also help your symptoms get better. Your health care provider may tell you to: Take over-the-counter medicines. Drink lots of fluids. The flu often goes away on its own. If you have very bad symptoms or problems caused by the flu, you may need to be treated in a hospital. Follow these instructions at home: Activity Rest as needed. Get lots of sleep. Stay home from work or school as told by your provider.  Leave home only to go see your provider. Do not leave home for other reasons until you don't have a fever for 24 hours without taking medicine. Eating and drinking Take an oral rehydration solution (ORS). This is a drink that is sold at pharmacies and stores. Drink enough fluid to keep your pee pale yellow. Try to drink small amounts of clear fluids. These include water, ice chips, fruit juice mixed with water, and low-calorie sports drinks. Try to eat bland foods that are easy to digest. These include bananas, applesauce, rice, lean meats, toast, and crackers. Avoid drinks that have a lot of sugar or caffeine in them. These include energy drinks, regular sports drinks, and soda. Do not drink alcohol. Do not eat spicy or fatty foods. General instructions     Take your medicines only as told by your provider. Use a cool mist humidifier to add moisture to the air in your home. This can make it easier for you to breathe. You should also clean the humidifier every day. To do so: Empty the water. Pour clean water in. Cover your mouth and nose when you cough or sneeze. Wash your hands with soap and water often and for at least 20 seconds. It's extra important to do so after you cough or  sneeze. If you can't use soap and water, use hand sanitizer. How is this prevented?  Get a flu shot every year. Ask your provider when you should get your flu shot. Stay away from people who are sick during fall and winter. Fall and winter are cold and flu season. Contact a health care provider if: You get new symptoms. You have chest pain. You have watery poop, also called diarrhea. You have a fever. Your cough gets worse. You start to have more mucus. You feel like you may vomit, or you vomit. Get help right away if: You become short of breath or have trouble breathing. Your skin or nails turn blue. You have very bad pain or stiffness in your neck. You get a sudden headache or pain in your face or ear. You vomit each time you eat or drink. These symptoms may be an emergency. Call 911 right away. Do not wait to see if the symptoms will go away. Do not drive yourself to the hospital. This information is not intended to replace advice given to you by your health care provider. Make sure you discuss any questions you have with your health care provider. Document Revised: 07/19/2023 Document Reviewed: 11/23/2022 Elsevier Patient Education  2024 Elsevier Inc.   If you have been instructed to have an in-person evaluation today at a local Urgent Care facility, please use the link below. It will take you to a list of all of our available [facility], including address, phone number and hours of operation. Please do not delay care.  [facility]  If you or a family member do not have a primary care provider, use the link below to schedule a visit and establish care. When you choose a [facility] primary care physician or advanced practice provider, you gain a long-term partner in health. Find a Primary Care Provider  Learn more about [facility]'s in-office and virtual care options: [place] [facility] - Get Care Now

## 2023-12-24 NOTE — Progress Notes (Signed)
 Virtual Visit Consent   [name], you are scheduled for a virtual visit with a [facility] provider today. Just as with appointments in the office, your consent must be obtained to participate. Your consent will be active for this visit and any virtual visit you may have with one of our providers in the next 365 days. If you have a MyChart account, a copy of this consent can be sent to you electronically.  As this is a virtual visit, video technology does not allow for your provider to perform a traditional examination. This may limit your provider's ability to fully assess your condition. If your provider identifies any concerns that need to be evaluated in person or the need to arrange testing (such as labs, EKG, etc.), we will make arrangements to do so. Although advances in technology are sophisticated, we cannot ensure that it will always work on either your end or our end. If the connection with a video visit is poor, the visit may have to be switched to a telephone visit. With either a video or telephone visit, we are not always able to ensure that we have a secure connection.  By engaging in this virtual visit, you consent to the provision of healthcare and authorize for your insurance to be billed (if applicable) for the services provided during this visit. Depending on your insurance coverage, you may receive a charge related to this service.  I need to obtain your verbal consent now. Are you willing to proceed with your visit today? [name] has provided verbal consent on 12/24/2023 for a virtual visit (video or telephone). Freddy Finner, NP  Date: 12/24/2023 9:47 AM   Virtual Visit via Video Note   I, Freddy Finner, connected with  [name]  ([id number], Apr 09, 1966) on 12/24/23 at  9:45 AM EST by a video-enabled telemedicine application and verified that I am speaking with the correct person using two identifiers.  Location: Patient: Virtual Visit Location Patient:  Home Provider: Virtual Visit Location Provider: Home Office   I discussed the limitations of evaluation and management by telemedicine and the availability of in person appointments. The patient expressed understanding and agreed to proceed.    History of Present Illness: [name] is a 58 y.o. who identifies as a female who was assigned female at birth, and is being seen today for congestion and cough.  Onset was 2 days- with congestion, cough, body aches. Associated symptoms are fever over weekend, headaches, sore throat.  Modifying factors are OTC cough meds  Denies chest pain, shortness of breath  Exposure to sick contacts- unknown COVID test: no    HPI: HPI  Problems:  Patient Active Problem List   Diagnosis Date Noted   Interstitial lung disease (HCC) 10/09/2023   Iron deficiency 12/30/2022   Vitamin D deficiency 08/19/2022   Urinary incontinence 04/09/2022   ILD (interstitial lung disease) (HCC) 06/27/2021   Unstable angina (HCC) 04/13/2021   Atypical nevi 03/17/2021   Atherosclerosis 03/08/2021   Grief reaction 03/02/2021   Acute diarrhea 12/29/2020   Acute right lumbar radiculopathy 12/11/2020   Encounter for smoking cessation counseling 12/11/2020   Acute lumbar back pain 07/25/2020   COPD (chronic obstructive pulmonary disease) (HCC) 06/01/2020   GAD (generalized anxiety disorder) 12/26/2019   Poor social situation 08/29/2019   Abnormal chest CT 08/29/2019   Poor dentition 06/25/2019   Lung nodule, solitary 06/25/2019   Chronic cough 11/26/2018   Recent unintentional weight loss over  several months 11/05/2018   Current smoker 11/05/2018   Paresthesia 07/22/2018   Skull deformity 07/22/2018   Hyperlipidemia 07/22/2018   MDD (major depressive disorder) 01/09/2012   Migraine with aura 05/09/2007   Mitral valve prolapse 05/09/2007    Allergies:  Allergies  Allergen Reactions   Dextromethorphan Other (See Comments)    keeps awake, legs constantly  moving    Doxylamine Other (See Comments)    keeps awake, legs constantly moving    Pseudoeph-Doxylamine-Dm-Apap Other (See Comments)    REACTION: keeps awake, legs constantly moving  NYQUIL   Pseudoephedrine Hcl Other (See Comments)    keeps awake, legs constantly moving    Medications:  Current Outpatient Medications:    albuterol (PROVENTIL) (2.5 MG/3ML) 0.083% nebulizer solution, INHALE 3 ML BY NEBULIZATION EVERY 6 HOURS AS NEEDED FOR WHEEZING OR SHORTNESS OF BREATH, Disp: 75 mL, Rfl: 5   aspirin 81 MG chewable tablet, Chew 81 mg by mouth daily., Disp: , Rfl:    Budeson-Glycopyrrol-Formoterol (BREZTRI AEROSPHERE) 160-9-4.8 MCG/ACT AERO, Inhale 2 puffs into the lungs in the morning and at bedtime., Disp: 10.7 g, Rfl: 11   busPIRone (BUSPAR) 15 MG tablet, Take 1 tablet (15 mg total) by mouth 2 (two) times daily., Disp: , Rfl:    ezetimibe (ZETIA) 10 MG tablet, Take 1 tablet (10 mg total) by mouth daily., Disp: 90 tablet, Rfl: 3   gabapentin (NEURONTIN) 300 MG capsule, TAKE 1 CAPSULE BY MOUTH TWICE A DAY, Disp: 60 capsule, Rfl: 2   linaclotide (LINZESS) 145 MCG CAPS capsule, Take 1 capsule (145 mcg total) by mouth daily as needed (Constipation)., Disp: , Rfl:    methocarbamol (ROBAXIN) 500 MG tablet, Take 1 tablet (500 mg total) by mouth every 8 (eight) hours as needed for muscle spasms., Disp: 12 tablet, Rfl: 0   nicotine (NICODERM CQ) 14 mg/24hr patch, Place 1 patch (14 mg total) onto the skin daily., Disp: 30 patch, Rfl: 2   nitroGLYCERIN (NITROSTAT) 0.4 MG SL tablet, Place 1 tablet (0.4 mg total) under the tongue every 5 (five) minutes as needed for chest pain., Disp: 25 tablet, Rfl: 1   omeprazole (PRILOSEC) 20 MG capsule, TAKE 1 CAPSULE BY MOUTH EVERY DAY, Disp: 90 capsule, Rfl: 1   ondansetron (ZOFRAN-ODT) 4 MG disintegrating tablet, Take 1 tablet (4 mg total) by mouth daily., Disp: , Rfl:    oxyCODONE (OXY IR/ROXICODONE) 5 MG immediate release tablet, Take 1 tablet (5 mg total) by  mouth every 6 (six) hours as needed for severe pain (pain score 7-10)., Disp: 28 tablet, Rfl: 0   ranolazine (RANEXA) 500 MG 12 hr tablet, Take 1 tablet (500 mg total) by mouth 2 (two) times daily., Disp: 180 tablet, Rfl: 3   rosuvastatin (CRESTOR) 40 MG tablet, TAKE 1 TABLET BY MOUTH EVERY DAY, Disp: 90 tablet, Rfl: 0   SUMAtriptan (IMITREX) 6 MG/0.5ML SOLN injection, Inject 0.5 mLs (6 mg total) into the skin every 2 (two) hours as needed for migraine or headache. May repeat in 2 hours if headache persists or recurs.  Limit to twice per week., Disp: 0.5 mL, Rfl: 8   traZODone (DESYREL) 50 MG tablet, Take 1 tablet (50 mg total) by mouth at bedtime., Disp: , Rfl:    VENTOLIN HFA 108 (90 Base) MCG/ACT inhaler, INHALE 1-2 PUFFS BY MOUTH EVERY 6 HOURS AS NEEDED FOR WHEEZE OR SHORTNESS OF BREATH, Disp: 18 each, Rfl: 2   Vitamin D, Ergocalciferol, (DRISDOL) 1.25 MG (50000 UNIT) CAPS capsule, Take 1 capsule (50,000  Units total) by mouth once a week., Disp: 5 capsule, Rfl: 0  Observations/Objective: Patient is well-developed, well-nourished in no acute distress.  Resting comfortably  at home.  Head is normocephalic, atraumatic.  No labored breathing.  Speech is clear and coherent with logical content.  Patient is alert and oriented at baseline.  Cough and congestion noted  Assessment and Plan:   1. Flu-like symptoms (Primary)  - oseltamivir (TAMIFLU) 75 MG capsule; Take 1 capsule (75 mg total) by mouth 2 (two) times daily for 5 days.  Dispense: 10 capsule; Refill: 0    - Continue OTC symptomatic management of choice - Take prescribed medications as directed - Push fluids - Rest as needed - Discussed return precautions and when to seek in-person evaluation, sent via AVS as well    Reviewed side effects, risks and benefits of medication.    Patient acknowledged agreement and understanding of the plan.   Past Medical, Surgical, Social History, Allergies, and Medications have been  Reviewed.     Follow Up Instructions: I discussed the assessment and treatment plan with the patient. The patient was provided an opportunity to ask questions and all were answered. The patient agreed with the plan and demonstrated an understanding of the instructions.  A copy of instructions were sent to the patient via MyChart unless otherwise noted below.     The patient was advised to call back or seek an in-person evaluation if the symptoms worsen or if the condition fails to improve as anticipated.    Freddy Finner, NP

## 2023-12-31 ENCOUNTER — Encounter: Payer: Self-pay | Admitting: Nurse Practitioner

## 2024-01-01 ENCOUNTER — Ambulatory Visit: Payer: Medicaid Other | Admitting: Internal Medicine

## 2024-01-01 ENCOUNTER — Telehealth: Payer: Self-pay | Admitting: Internal Medicine

## 2024-01-01 ENCOUNTER — Telehealth: Payer: Self-pay | Admitting: Pharmacist

## 2024-01-01 ENCOUNTER — Encounter: Payer: Self-pay | Admitting: Internal Medicine

## 2024-01-01 ENCOUNTER — Ambulatory Visit: Admitting: Family

## 2024-01-01 VITALS — BP 104/60 | HR 79 | Ht 60.0 in | Wt 126.0 lb

## 2024-01-01 VITALS — BP 108/57 | HR 70 | Temp 98.1°F | Resp 16 | Wt 124.2 lb

## 2024-01-01 DIAGNOSIS — J439 Emphysema, unspecified: Secondary | ICD-10-CM

## 2024-01-01 DIAGNOSIS — G8918 Other acute postprocedural pain: Secondary | ICD-10-CM | POA: Diagnosis not present

## 2024-01-01 DIAGNOSIS — J849 Interstitial pulmonary disease, unspecified: Secondary | ICD-10-CM | POA: Diagnosis not present

## 2024-01-01 DIAGNOSIS — G4700 Insomnia, unspecified: Secondary | ICD-10-CM

## 2024-01-01 DIAGNOSIS — J84112 Idiopathic pulmonary fibrosis: Secondary | ICD-10-CM | POA: Diagnosis not present

## 2024-01-01 DIAGNOSIS — Z85828 Personal history of other malignant neoplasm of skin: Secondary | ICD-10-CM

## 2024-01-01 DIAGNOSIS — R202 Paresthesia of skin: Secondary | ICD-10-CM | POA: Diagnosis not present

## 2024-01-01 DIAGNOSIS — F32A Depression, unspecified: Secondary | ICD-10-CM

## 2024-01-01 DIAGNOSIS — L659 Nonscarring hair loss, unspecified: Secondary | ICD-10-CM

## 2024-01-01 DIAGNOSIS — E785 Hyperlipidemia, unspecified: Secondary | ICD-10-CM | POA: Diagnosis not present

## 2024-01-01 DIAGNOSIS — Z8639 Personal history of other endocrine, nutritional and metabolic disease: Secondary | ICD-10-CM | POA: Diagnosis not present

## 2024-01-01 DIAGNOSIS — R110 Nausea: Secondary | ICD-10-CM

## 2024-01-01 DIAGNOSIS — F419 Anxiety disorder, unspecified: Secondary | ICD-10-CM

## 2024-01-01 DIAGNOSIS — Z87891 Personal history of nicotine dependence: Secondary | ICD-10-CM

## 2024-01-01 MED ORDER — ONDANSETRON 4 MG PO TBDP: 4.0000 mg | ORAL_TABLET | Freq: Three times a day (TID) | ORAL | 2 refills | Status: DC | PRN

## 2024-01-01 MED ORDER — ROSUVASTATIN CALCIUM 40 MG PO TABS: 40.0000 mg | ORAL_TABLET | Freq: Every day | ORAL | 0 refills | Status: DC

## 2024-01-01 MED ORDER — GABAPENTIN 300 MG PO CAPS: 300.0000 mg | ORAL_CAPSULE | Freq: Three times a day (TID) | ORAL | 2 refills | Status: DC

## 2024-01-01 MED ORDER — TRAZODONE HCL 100 MG PO TABS: 100.0000 mg | ORAL_TABLET | Freq: Every day | ORAL | 2 refills | Status: DC

## 2024-01-01 MED ORDER — BUSPIRONE HCL 30 MG PO TABS: 30.0000 mg | ORAL_TABLET | Freq: Two times a day (BID) | ORAL | 2 refills | Status: DC

## 2024-01-01 MED ORDER — OXYCODONE HCL 5 MG PO TABS: 5.0000 mg | ORAL_TABLET | Freq: Two times a day (BID) | ORAL | 0 refills | Status: AC

## 2024-01-01 MED ORDER — HYDROXYZINE PAMOATE 25 MG PO CAPS: 25.0000 mg | ORAL_CAPSULE | Freq: Three times a day (TID) | ORAL | 1 refills | Status: AC | PRN

## 2024-01-01 NOTE — Progress Notes (Signed)
 OV 02/22/2021  Subjective:  Patient ID: [name], female , DOB: 1965-12-21 , age 58 y.o. , MRN: [id number] , ADDRESS: [address] [place] [address] PCP Allayne Stack, DO Patient Care Team: Allayne Stack, DO as PCP - General (Family Medicine) Glendale Chard, DO as Consulting Physician (Neurology)  This Provider for this visit: Treatment Team:  Attending Provider: Kalman Shan, MD    02/22/2021 -   Chief Complaint  Patient presents with   Consult    Hx of COPD, nodules on CT scan a year or 2 ago.  Pna on 4/2.     HPI [name] 58 y.o. -presents for pulm evaluation because of chronic cough and shortness of breath.  She tells me that she has cough and shortness of breath for few to several years.  Slowly it has been progressive.  In March 2022 she had COVID-19.  Treated as outpatient.  This did not change her baseline symptoms.  She feels symptoms are significant as documented below.  There is associated wheezing.  She is on Trelegy and this helps her.  Albuterol also helps.  She says she has been given a diagnosis of COPD based on CT scan of the chest and symptoms.  She says she has had exacerbations treated by prednisone 3 times as outpatient.  This is also helped her symptoms.  She has over 25 pack smoking history.  Probably over 30 pack.  She quit for many years and then finally started smoking again 5 years ago because of stressors in life.  She finally quit again 2 weeks ago although she says it is very hard to maintain remission.  Even in the years when she quit smoking she was still exposed to the wood smoke.  Her mom continues to smoke and she is exposed to passive smoke.  Her mom has COPD so this family history of COPD.  Unclear if this alpha-1 antitrypsin phenotype.   Of note: She has poor dentition.  She is seen Dr. Allean Found in [place] oral surgery.  Given her chronic cough and wheezing and shortness of breath they wanted pulmonary  clearance.   Exposure hx  - She smoked from age 24 to age 53 x 1 pack/day.  Then she quit till age 41 and for the last 5 years has been smoking 1 pack a day again and then quit 2 weeks ago.  Total smoking history then becomes 27 pack/day  -She owns a large property according to history.  Unclear if it is rule out commercial or residential line.  Nevertheless she does burn a lot of wood on a regular basis constantly and gets exposed to the wood smoke.  Although does not stove is wood smoke exposure there is significant  -No roaches or mildew in the house.  No mold in the house.  No bird feather in the house.  No pet birds or parakeets.  No gerbils no hamsters.  No down pillow.  No feather jackets.  There are no roaches in the house.  CT Chest data dec 2020   IMPRESSION: Peribronchial thickening with persistent bibasilar ground-glass infiltrates which could be infectious or inflammatory.   Resolution of majority of mucous plugging since previous exam.   Atherosclerotic calcifications including coronary arteries.   Resolution of previously identified 8 mm LEFT upper lobe ground-glass nodule.   Aortic Atherosclerosis (ICD10-I70.0).     Electronically Signed   By: Ulyses Southward M.D.   On: 10/15/2019  08:59   1 Patient Communication  Component 1 mo ago 3/722  SARS Coronavirus 2 RESULT: POSITIVE Abnormal         PFT  No flowsheet data found.       03/08/2021 Patient presents today for follow-up. During last visit she was ordered for labs including CBC with diff, IgE, RAST allergy panel and Alpha 1 phenotype. She was also ordered for HRCT. Recommended to continue Trelegy. Pre-op eval for Dr. Allean Found DDS at follow-up, may need cardiology eval/clearance if imaging shows any significant cardiac findings. CT Imaging showed significant worsening of chronic extensive patchy ground glass opacities throughout both lungs with associated mild patchy subpleural reticulation and mild  cylindrical bronchiectasis. Favoring desquamative interstitial pneumonia. Three vessel coronary atherosclerosis.   She feels her breathing has improve some. She still has a np congested cough, this is not new. She is unable to produce mucus. She is still smoking 10 cigarettes a day. She is interested in quitting and has in the past several years ago. Her primary care physican just put her on chantix. She has no PFTs on file. She is needing clearance for dental work, unclear type of anesthesia. She had no issues with recent colonscopy several weeks ago.    06/09/2021 - Interim hx  Patient presents today for 3 month follow-up with PFTs. Current smoker, she is still smoking 10 cigarettes a day. She is having a hard time quitting, she tells me that everyone in her life smokes around her. She was unable to tolerate chantix d/t nausea/vomiting for 1 month. She reports improvement in breathing with Trelegy. Uses SABA on average once a day. Albuterol helped her wheezing after she took it today for her breathing test. She still has a chronic np cough, intermittent wheezing and dyspnea on exertion. She was unable to take mucinex because she can not swallow large pills. HRCT imaging in May 2022 showed significant worsening of smoking related ILD changes, at her last visit we stressed importance of smoking cessation. Breathing test today showed moderate-severe restriction and severe diffusion defect. 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 FENO 23.     Pulmonary function testing 06/09/2021-FVC 1.55 (50%), FEV1 1.34 (55%), ratio 87, TLC 76%, DLCOunc 8.09 (43%)  Labs:  02/25/21 - Eos absolute 700; IgE 2. Rast allergy panel negative.  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 Significant worsening of chronic extensive patchy ground-glass opacities throughout both lungs, lower lobe predominant, with associated mild patchy subpleural reticulation and mild cylindrical bronchiectasis. 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 2. Three-vessel coronary atherosclerosis. 3. Diffuse bronchial wall thickening and mild centrilobular emphysema, compatible with the provided history of COPD. 4. Aortic Atherosclerosis (ICD10-I70.0) and Emphysema (ICD10-J43.9).   Exposure hx  - She smoked from age 31 to age 88 x 1 pack/day.  Then she quit till age 41 and for the last 5 years has been smoking 1 pack a day again and then quit 2 weeks ago.  Total smoking history then becomes 84 pack/day   -She owns a large property according to history.  Unclear if it is rule out commercial or residential line.  Nevertheless she does burn a lot of wood on a regular basis constantly and gets exposed to the wood smoke.  Although does not stove is wood smoke exposure there is significant   -No roaches or mildew in the house.  No mold in the house.  No bird feather in the  house.  No pet birds or parakeets.  No gerbils no hamsters.  No down pillow.  No feather jackets.  There are no roaches in the house.   OV 06/13/2022  Subjective:  Patient ID: [name], female , DOB: February 14, 1966 , age 67 y.o. , MRN: [id number] , ADDRESS: [address] [place] [address] PCP Cora Collum, DO Patient Care Team: Cora Collum, DO as PCP - General (Family Medicine) Nahser, Deloris Ping, MD as PCP - Cardiology (Cardiology) Glendale Chard, DO as Consulting Physician (Neurology)  This Provider for this visit: Treatment Team:  Attending Provider: Kalman Shan, MD    06/13/2022 -   Chief Complaint  Patient presents with   Follow-up    PFT performed today.  Pt states she has been having pain in sides and back when she coughs or breathes. States her breathing has been worse.   HPI [name] 58 y.o. -returns for follow-up.  Its been a year since I saw her.  At that time there is concern significantly for DIP which is interstitial lung disease on account of her  smoking.  So we also do pulmonary function test.  She did pulmonary function test today.  She has severe restrictio nd her FEV1 is significantly declined compared to a year ago..  I personally visualized PFT.  Unable to get this to flow into the epic at this point in time.  She tells me that overall she continues to have significant cough.  She also tries to bring her to phlegm and she can taste it to be of metallic taste but unable to expectorate.  She is on Trelegy but she ran out a few weeks ago because of insurance issues.  She is also complaining about pain in her scapular region from coughing a lot.  And for the last few days she has had increased cough, congestion, tightness and increased wheezing.  She feels she is in a COPD exacerbation at this point.  Her smoking has relapsed.  People around her house smoke.  She has extensive family members with COPD.  Her mother is going to see 1 of pulmonary physicians in the office in the next few weeks.  She is interested in research studies.   OV 04/25/2023  Subjective:  Patient ID: [name], female , DOB: [birth year] , age 72 y.o. , MRN: [id number] , ADDRESS: [address] [place] [address] PCP [name], NP Patient Care Team: [name], NP as PCP - General (Nurse Practitioner) Nahser, Deloris Ping, MD as PCP - Cardiology (Cardiology) Glendale Chard, DO as Consulting Physician (Neurology)  This Provider for this visit: Treatment Team:  Attending Provider: Kalman Shan, MD    04/25/2023 -  No chief complaint on file.   HPI [name] 58 y.o. -   OV 07/03/2023  Subjective:  Patient ID: [name], female , DOB: 01/29/1966 , age 34 y.o. , MRN: [id number] , ADDRESS: [address] [place] [address] PCP [name], NP Patient Care Team: [name], NP as PCP - General (Nurse Practitioner) Nahser, Deloris Ping, MD as PCP - Cardiology (Cardiology) Glendale Chard, DO as Consulting Physician  (Neurology)  This Provider for this visit: Treatment Team:  Attending Provider: Kalman Shan, MD 07/03/2023 -   Chief Complaint  Patient presents with   Follow-up    F/up      HPI [name] 58 y.o. -not seen in over a  year.  In October 2023 she had a CT scan and showed worsening ILD.  The ILD features are getting more prominent.  She missed her appointment in June 2024.  This is because a mother who had small cell cancer died.  She was the main caregiver.  She says she continues to smoke.  She is not able to afford Trelegy.  Therefore she is doing albuterol as needed.  She is constantly coughing.  Cough is particularly worse at night but also in the daytime it is dry.  She says she is wheezing all the time.  She is also short of breath but the wheezing has gotten worse and the cough is gotten worse the shortness of breath around the same.  Of note for the last 1 days she is noticing submandibular cervical adenopathy.  Has not seen anybody for this.  There is no sore throat or fever or chills.    CT Chest data from date: *HRCT 08/17/22  - personally visualized and independently interpreted : yes - my findings are: as below  IMPRESSION: 1. The appearance of the lungs is indicative of interstitial lung disease, with some areas of progression compared to the prior study and other areas which appear improved. Overall, the spectrum of findings on today's examination is technically categorized as probable usual interstitial pneumonia (UIP) per current ATS guidelines. Repeat high-resolution chest CT is suggested in 12 months to assess for temporal changes in the appearance of the lung parenchyma. 2. Mild centrilobular and paraseptal emphysema also noted, compatible with reported clinical history of COPD. 3. Aortic atherosclerosis, in addition to left main and three-vessel coronary artery disease. Please note that although the presence of coronary artery calcium documents the presence  of coronary artery disease, the severity of this disease and any potential stenosis cannot be assessed on this non-gated CT examination. Assessment for potential risk factor modification, dietary therapy or pharmacologic therapy may be warranted, if clinically indicated.   Aortic Atherosclerosis (ICD10-I70.0) and Emphysema (ICD10-J43.9).     Electronically Signed   By: Trudie Reed M.D.   On: 08/18/2022 09:43    OV 09/06/2023  Subjective:  Patient ID: [name], female , DOB: [birth year] , age 34 y.o. , MRN: [id number] , ADDRESS: [address] [place] [address] PCP [name], NP Patient Care Team: [name], NP as PCP - General (Nurse Practitioner) Nahser, Deloris Ping, MD as PCP - Cardiology (Cardiology) Glendale Chard, DO as Consulting Physician (Neurology)  This Provider for this visit: Treatment Team:  Attending Provider: Kalman Shan, MD    09/06/2023 -   Chief Complaint  Patient presents with   Follow-up    Pt would like to f/u on ct, labs and pft results. Has concerns with severe cough     HPI [name] 58 y.o. -presents for follow-up after completing some of her ILD workup.   In terms of his symptoms: These remain the same.  See below.  No new medical problems no ER visits no surgeries.  In terms of her ILD workup  -She continues to smoke  - This underwent EGD by Dr. 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  She is on antinausea medications according to chart review.  According to history the EGD was unremarkable.  I confirmed this upon record review of the her endoscopy report 09/04/2023  -  she had autoimmune serology this is noorma  -She had a high-resolution  CT chest: I agree with the radiologist there is emphysema and also air trapping.  I disagree with the radiologist after my own personal visualization that this is probable UIP.  I feel it is  indeterminate/alternative diagnosis.  What started as a groundglass opacities 4 years ago has progressed into full-blown ILD.  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  She is a smoker this could be smokers ILD as well.   CT Chest data from date: *  - personally visualized and independently interpreted : yes - my findings are: INDETERMINATE FOR UIP v ALTERNATIVE Dx  Narrative & Impression  CLINICAL DATA:  58 year old female current smoker with history of interstitial lung disease. Follow-up study.   EXAM: CT CHEST WITHOUT CONTRAST   TECHNIQUE: Multidetector CT imaging of the chest was performed following the standard protocol without intravenous contrast. High resolution imaging of the lungs, as well as inspiratory and expiratory imaging, was performed.   RADIATION DOSE REDUCTION: This exam was performed according to the departmental dose-optimization program which includes automated exposure control, adjustment of the mA and/or kV according to patient size and/or use of iterative reconstruction technique.   COMPARISON:  High-resolution chest CT 08/17/2022.   FINDINGS: Cardiovascular: Heart size is normal. There is no significant pericardial fluid, thickening or pericardial calcification. There is aortic atherosclerosis, as well as atherosclerosis of the great vessels of the mediastinum and the coronary arteries, including calcified atherosclerotic plaque in the left main, left anterior descending, left circumflex and right coronary arteries.   Mediastinum/Nodes: No pathologically enlarged mediastinal or hilar lymph nodes. Please note that accurate exclusion of hilar adenopathy is limited on noncontrast CT scans. Esophagus is unremarkable in appearance. No axillary lymphadenopathy.   Lungs/Pleura: High-resolution images again demonstrate widespread areas of ground-glass attenuation, septal thickening, subpleural reticulation, cylindrical bronchiectasis,  peripheral bronchiolectasis and thickening of the peribronchovascular interstitium. These findings are most evident throughout the mid to lower lungs bilaterally and appear clearly progressive compared to the prior study. No definitive honeycombing confidently identified. Inspiratory and expiratory imaging is remarkable for mild air trapping indicative of mild small airways disease. No pleural effusions. No definite suspicious appearing pulmonary nodules or masses are noted. Mild centrilobular and paraseptal emphysema.   Upper Abdomen: Aortic atherosclerosis.   Musculoskeletal: Bilateral breast implants are incidentally noted. Chronic appearing compression fracture of inferior endplate of T10 with proximally 20% loss of anterior vertebral body height, unchanged. There are no aggressive appearing lytic or blastic lesions noted in the visualized portions of the skeleton.   IMPRESSION: 1. Progressive interstitial lung disease once again categorized as probable usual interstitial pneumonia (UIP) per current ATS guidelines. 2. Mild air trapping indicative of mild small airways disease. 3. Mild centrilobular and paraseptal emphysema also noted. 4. Aortic atherosclerosis, in addition to left main and three-vessel coronary artery disease. Please note that although the presence of coronary artery calcium documents the presence of coronary artery disease, the severity of this disease and any potential stenosis cannot be assessed on this non-gated CT examination. Assessment for potential risk factor modification, dietary therapy or pharmacologic therapy may be warranted, if clinically indicated.   Aortic Atherosclerosis (ICD10-I70.0) and Emphysema (ICD10-J43.9).     Electronically Signed   By: Trudie Reed M.D.   On: 08/05/2023 12:08     OV 01/01/2024  Subjective:  Patient ID: [name], female , DOB: [birth year] , age 50 y.o. , MRN: [id number] , ADDRESS: [address] [place] [address] PCP [name], NP Patient Care Team:  [name], NP as PCP - General (Nurse Practitioner) Nahser, Deloris Ping, MD as PCP - Cardiology (Cardiology) Glendale Chard, DO as Consulting Physician (Neurology)  This Provider for this visit: Treatment Team:  Attending Provider: Kalman Shan, MD    01/01/2024 -   Chief Complaint  Patient presents with   Follow-up    Having some right side pain after lung biopsy. She is getting over the flu- just finished tamiflu on 12/27/23.      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 Kristeen Miss May 2023   #ILD   - GGO mild 2020  -> slightly more proinet 2022 -> ILD/?NSIP intdeterminate pattern 2023 and 2024 WORSE  -Surgical lung biopsy by Dr. Judi Cong consistent with UIP or likely UIP pathology Dr. Donnel Saxon   HPI [name] 58 y.o. -returns for follow-up after surgical lung biopsy.  She tells me that after the last visit in November 2024 she quit smoking.  Then in October 09, 2023 she underwent surgical lung biopsy by Dr. Cliffton Asters.  The results are showing UIP but there is also inflammation.  There is microscopic honeycombing.  This needs to be discussed in the case conference.  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  From a breathing standpoint she feels she is stable but her main complaint is  postoperative pain.  She says she is very tender at the incisional area which is actually healed quite well.  It gets worse when she lies down or puts pressure on it or touches it.  Today's the first day she wore a bra.  Because it is extremely sensitive and tender.  She got some opioid prescriptions from Dr. Cliffton Asters but these are run out.  She wants me to fill that.  I have agreed to a temporary refill.  We discussed the possibility this is UIP/IPF and discussed antifibrotic therapy.  We discussed both pirfenidone and nintedanib.  She prefers nintedanib because of the easy schedule she understands diarrhea is a side effect but also requires drug-induced liver injury monitoring and understands the rare side effects   We also discussed clinical trials as a care option and she is interested.  We gave her IPF-pro registry consent.    SYMPTOM SCALE - Pulm 02/22/2021   06/09/2021  09/06/2023  01/01/2024 Postbiopsy 10/08/2024  O2 use ra RA ra   Shortness of Breath 0 -> 5 scale with 5 being worst (score 6 If unable to do)     At rest 2 2 2  0/5  Simple tasks - showers, clothes change, eating, shaving 2 3 2.5 2  Household (dishes, doing bed, laundry) 3.5 4 2.5 2.5  Shopping 4 4 3  2.5  Walking level at own pace 2 3 3 2   Walking up Stairs 3 4 4  3.5  Total (30-36) Dyspnea Score 18.5 20 19 13   How bad  is your cough? 4 3 Bad ribs hurts Not right now  How bad is your fatigue 5 5 All the time All te time  How bad is nausea 4 4 sometimes 5  How bad is vomiting?   2 2 no 2  How bad is diarrhea? 0 0 no 2  How bad is anxiety? 5 3 Not bad 4  How bad is depression 5 3 Not bad 4  0   PFT     Latest Ref Rng & Units 08/14/2023   10:59 AM 06/13/2022    9:05 AM 06/09/2021    9:05 AM  PFT Results  FVC-Pre L 1.34  1.19  1.61   FVC-Predicted Pre % 45  38  52   FVC-Post L 1.45   1.55   FVC-Predicted Post % 49   50   Pre FEV1/FVC % % 83  83  81   Post FEV1/FCV % % 82   87   FEV1-Pre L 1.12  0.98  1.30    FEV1-Predicted Pre % 48  40  53   FEV1-Post L 1.20   1.34   DLCO uncorrected ml/min/mmHg 7.49  7.87  8.09   DLCO UNC% % 41  42  43   DLCO corrected ml/min/mmHg  7.87  8.09   DLCO COR %Predicted %  42  43   DLVA Predicted % 72  72  83   TLC L 2.92   3.52   TLC % Predicted % 65   76   RV % Predicted % 84   110        LAB RESULTS last 96 hours No results found.   SURGICAL PATHOLOGY  - 10/09/23 CASE: [id number]  PATIENT: [name]  Surgical Pathology Report      Clinical History: interstitial lung disease (cm)      FINAL MICROSCOPIC DIAGNOSIS:   A. LUNG, RIGHT UPPER LOBE, WEDGE RESECTION:  - Interstitial fibrosing and chronic inflammatory process, see comment   B. LUNG, RIGHT MIDDLE LOBE, WEDGE RESECTION:  - Lung parenchyma with intra-alveolar macrophages, emphysematous changes  and minimal to mild interstitial fibrosis, see comment   C. LUNG, RIGHT LOWER LOBE, WEDGE RESECTION:  - Patchy mild interstitial fibrosing and chronic inflammatory process,  see comment   COMMENT:  The interstitial fibrosing process is temporally heterogeneous, predominantly involving the right upper lobe with partial involvement of the lower lobe and relative sparing of the middle lobe. There is evidence of architectural distortion with peribronchiolar and smooth muscle metaplasia.  There is multifocal microscopic honeycombing with rare fibroblastic foci mostly within the upper lobe.  Granulomas or increased eosinophils are not seen.  Though the findings are not very typical, overall, the nature of the interstitial fibrosis in this case are most suggestive of usual interstitial pneumonia (UIP) pattern. UIP pattern may be seen with collagen vascular diseases, drug reactions, chronic hypersensitivity pneumonitis or as an idiopathic disease (idiopathic pulmonary fibrosis). Clinical and radiographic correlation is necessary.  Final Diagnosis performed by Holley Bouche,  MD. Electronically signed 10/19/2023 Technical component performed at [facility]. [facility]   has a past medical history of Allergy, Anemia, Anxiety, Arthritis, Cancer (HCC), CAP (community acquired pneumonia) (01/07/2020), Chronic kidney disease, Cigarette smoker (11/05/2018), COPD (chronic obstructive pulmonary disease) (HCC), Depression, Heart murmur, High cholesterol, History of endometriosis, Incontinence, Low back pain radiating to both legs (07/25/2020), Menopausal symptoms, Menopause (01/09/2012), Migraine, Myocardial infarction [facility]), Night sweats (10/05/2019), Osteoporosis, Paresthesia of foot, bilateral (03/28/2019), Productive cough (12/25/2019), Renal stone (06/01/2020), Skin  cancer, Thyroid disease, and Tick bite (02/10/2019).   reports that she has quit smoking. Her smoking use included cigarettes. She has a 34 pack-year smoking history. She has been exposed to tobacco smoke. She has never used smokeless tobacco.  Past Surgical History:  Procedure Laterality Date   ANKLE SURGERY     RIGHT   APPENDECTOMY     AUGMENTATION MAMMAPLASTY     BREAST ENHANCEMENT SURGERY     SALINE   BUNIONECTOMY     BILATERAL FEET   INTERCOSTAL NERVE BLOCK Right 10/09/2023   Procedure: INTERCOSTAL NERVE BLOCK;  Surgeon: Corliss Skains, MD;  Location: [facility];  Service: Thoracic;  Laterality: Right;   LEFT HEART CATH AND CORONARY ANGIOGRAPHY N/A 04/13/2021   Procedure: LEFT HEART CATH AND CORONARY ANGIOGRAPHY;  Surgeon: Marykay Lex, MD;  Location: [facility] INVASIVE CV LAB;  Service: Cardiovascular;  Laterality: N/A;   TOOTH EXTRACTION N/A 07/11/2021   Procedure: DENTAL RESTORATION / EXTRACTIONS x 13;  Surgeon: Ocie Doyne, DMD;  Location: [facility];  Service: Oral Surgery;  Laterality: N/A;   TOTAL ABDOMINAL HYSTERECTOMY W/ BILATERAL SALPINGOOPHORECTOMY     TVT  10/30/2005   WISDOM TOOTH EXTRACTION     X 4    Allergies  Allergen Reactions   Dextromethorphan Other (See Comments)     keeps awake, legs constantly moving    Doxylamine Other (See Comments)    keeps awake, legs constantly moving    Pseudoeph-Doxylamine-Dm-Apap Other (See Comments)    REACTION: keeps awake, legs constantly moving  NYQUIL   Pseudoephedrine Hcl Other (See Comments)    keeps awake, legs constantly moving     Immunization History  Administered Date(s) Administered   Hepatitis B, PED/ADOLESCENT 07/09/2023   Influenza,inj,Quad PF,6+ Mos 12/10/2020   Influenza-Unspecified 07/09/2023   Pneumococcal-Unspecified 02/10/2021   Td 05/30/2002   Tdap 11/12/2011, 02/16/2021   Zoster Recombinant(Shingrix) 12/12/2020, 02/16/2021    Family History  Problem Relation Age of Onset   Diabetes Mother    Heart disease Mother    Hyperlipidemia Mother    Colon polyps Mother    Tuberculosis Father    Alcohol abuse Father    Heart disease Sister    Hyperlipidemia Sister    Heart disease Brother    Hyperlipidemia Brother    Diabetes Maternal Grandmother    Diabetes Maternal Grandfather    Colon cancer Maternal Uncle    Stomach cancer Maternal Uncle      Current Outpatient Medications:    albuterol (PROVENTIL) (2.5 MG/3ML) 0.083% nebulizer solution, INHALE 3 ML BY NEBULIZATION EVERY 6 HOURS AS NEEDED FOR WHEEZING OR SHORTNESS OF BREATH, Disp: 75 mL, Rfl: 5   aspirin 81 MG chewable tablet, Chew 81 mg by mouth daily., Disp: , Rfl:    Budeson-Glycopyrrol-Formoterol (BREZTRI AEROSPHERE) 160-9-4.8 MCG/ACT AERO, Inhale 2 puffs into the lungs in the morning and at bedtime., Disp: 10.7 g, Rfl: 11   busPIRone (BUSPAR) 30 MG tablet, Take 1 tablet (30 mg total) by mouth 2 (two) times daily., Disp: 60 tablet, Rfl: 2   ezetimibe (ZETIA) 10 MG tablet, Take 1 tablet (10 mg total) by mouth daily., Disp: 90 tablet, Rfl: 3   gabapentin (NEURONTIN) 300 MG capsule, Take 1 capsule (300 mg total) by mouth 3 (three) times daily., Disp: 90 capsule, Rfl: 2   HEPLISAV-B injection, , Disp: , Rfl:    hydrOXYzine (VISTARIL)  25 MG capsule, Take 1 capsule (25 mg total) by mouth every 8 (eight) hours as needed., Disp:  30 capsule, Rfl: 1   linaclotide (LINZESS) 145 MCG CAPS capsule, Take 1 capsule (145 mcg total) by mouth daily as needed (Constipation)., Disp: , Rfl:    nicotine (NICODERM CQ) 14 mg/24hr patch, Place 1 patch (14 mg total) onto the skin daily., Disp: 30 patch, Rfl: 2   nitroGLYCERIN (NITROSTAT) 0.4 MG SL tablet, Place 1 tablet (0.4 mg total) under the tongue every 5 (five) minutes as needed for chest pain., Disp: 25 tablet, Rfl: 1   omeprazole (PRILOSEC) 20 MG capsule, TAKE 1 CAPSULE BY MOUTH EVERY DAY, Disp: 90 capsule, Rfl: 1   ondansetron (ZOFRAN-ODT) 4 MG disintegrating tablet, Take 1 tablet (4 mg total) by mouth every 8 (eight) hours as needed for nausea or vomiting., Disp: 30 tablet, Rfl: 2   oxyCODONE (ROXICODONE) 5 MG immediate release tablet, Take 1 tablet (5 mg total) by mouth every 12 (twelve) hours for 15 days., Disp: 30 tablet, Rfl: 0   ranolazine (RANEXA) 500 MG 12 hr tablet, Take 1 tablet (500 mg total) by mouth 2 (two) times daily., Disp: 180 tablet, Rfl: 3   rosuvastatin (CRESTOR) 40 MG tablet, Take 1 tablet (40 mg total) by mouth daily., Disp: 90 tablet, Rfl: 0   SUMAtriptan (IMITREX) 6 MG/0.5ML SOLN injection, Inject 0.5 mLs (6 mg total) into the skin every 2 (two) hours as needed for migraine or headache. May repeat in 2 hours if headache persists or recurs.  Limit to twice per week., Disp: 0.5 mL, Rfl: 8   traZODone (DESYREL) 100 MG tablet, Take 1 tablet (100 mg total) by mouth at bedtime., Disp: 30 tablet, Rfl: 2   VENTOLIN HFA 108 (90 Base) MCG/ACT inhaler, INHALE 1-2 PUFFS BY MOUTH EVERY 6 HOURS AS NEEDED FOR WHEEZE OR SHORTNESS OF BREATH, Disp: 18 each, Rfl: 2      Objective:   Vitals:   01/01/24 1415  BP: 104/60  Pulse: 79  SpO2: 97%  Weight: 126 lb (57.2 kg)  Height: 5' (1.524 m)    Estimated body mass index is 24.61 kg/m as calculated from the following:   Height as of  this encounter: 5' (1.524 m).   Weight as of this encounter: 126 lb (57.2 kg).  @WEIGHTCHANGE @  [employer]   01/01/24 1415  Weight: 126 lb (57.2 kg)     Physical Exam   General: No distress. Looks in pain O2 at rest: no Cane present: no Sitting in wheel chair: no Frail: no Obese: no Neuro: Alert and Oriented x 3. GCS 15. Speech normal Psych: Pleasant Resp:  Barrel Chest - no.  Wheeze - no, Crackles - YES BAE, No overt respiratory distress CHEST - healthy small scars, tender + no redness  CVS: Normal heart sounds. Murmurs - no Ext: Stigmata of Connective Tissue Disease - no HEENT: Normal upper airway. PEERL +. No post nasal drip        Assessment:       ICD-10-CM   1. ILD (interstitial lung disease) (HCC)  J84.9 Pulmonary function test    2. IPF (idiopathic pulmonary fibrosis) (HCC)  J84.112 Pulmonary function test    3. Post-operative pain  G89.18 Pulmonary function test    4. Quit smoking within past year  Z87.891 Pulmonary function test    5. Pulmonary emphysema, unspecified emphysema type (HCC)  J43.9 Pulmonary function test         Plan:     Patient Instructions    ILD (interstitial lung disease) (HCC) IPF [idiopathic pulmonary fibrosis]  -According to  biopsy report 10/09/2023 the results are consistent with IPF  -  Plan shared decision making after extensive counseling -Check liver function test today -Start nintedanib 100 mg twice daily low-dose protocol  -Consider pirfenidone as second line -Consider clinical trials as a care option and glad you are interested  -Meet Carolyne Littles for enrollment in IPF-pro registry study -Will get your biopsy read at the [facility] clinic for a second opinion -Will get discussed on case conference  Postoperative chest pain following surgical lung biopsy  Plan  - Will give you a prescription for oxycodone till the end of March 2025 but after this please follow this with Dr. Cliffton Asters   Emphysema  Plan    -conitnue Breztril daily  - continue albuterolas needed  Smoking - QUIT 09/06/23  -glad you quit 09/06/23  PLAN  - Stay in remission.   Follow-up - 6 weeks nurse practitioner visit to discuss uptake with nintedanib -12 weeks perimetry and DLCO   FOLLOWUP Return for 6 weeks with APP and 12 weeks after spri/dlco with Lajuanna Pompa 30 min.    SIGNATURE    Dr. Kalman Shan, M.D., F.C.C.P,  Pulmonary and Critical Care Medicine Staff Physician, [facility] Center Director - Interstitial Lung Disease  Program  Pulmonary Fibrosis [facility] at [facility] [place], [place], [address]  Pager: [phone], If no answer or between  15:00h - 7:00h: call [phone] Telephone: [phone]  5:03 PM 01/01/2024  =

## 2024-01-01 NOTE — Telephone Encounter (Signed)
 Patient states both medications needs prior authorization, Pharmacy is [facility] [address] Patient phone number is [phone].

## 2024-01-01 NOTE — Telephone Encounter (Signed)
 Thank you- will start benefits investigation for Ofev  Dose: 100mg  twice daily

## 2024-01-01 NOTE — Patient Instructions (Addendum)
   ILD (interstitial lung disease) (HCC) IPF [idiopathic pulmonary fibrosis]  -According to biopsy report 10/09/2023 the results are consistent with IPF  -  Plan shared decision making after extensive counseling -Check liver function test today -Start nintedanib 100 mg twice daily low-dose protocol  -Consider pirfenidone as second line -Consider clinical trials as a care option and glad you are interested  -Meet Carolyne Littles for enrollment in IPF-pro registry study -Will get your biopsy read at the [facility] clinic for a second opinion -Will get discussed on case conference  Postoperative chest pain following surgical lung biopsy  Plan  - Will give you a prescription for oxycodone till the end of March 2025 but after this please follow this with Dr. Cliffton Asters   Emphysema  Plan   -conitnue Breztril daily  - continue albuterolas needed  Smoking - QUIT 09/06/23  -glad you quit 09/06/23  PLAN  - Stay in remission.   Follow-up - 6 weeks nurse practitioner visit to discuss uptake with nintedanib -12 weeks perimetry and DLCO

## 2024-01-01 NOTE — Telephone Encounter (Signed)
 Patient will be low-dose Ofev new start  Dose: 100mg  twice daily  Submitted a Prior Authorization request to [facility] for OFEV via CoverMyMeds. Will update once we receive a response.  Key: [id number]

## 2024-01-01 NOTE — Telephone Encounter (Signed)
 Rx team - [name] 0 low dose ofev start  Thanks    SIGNATURE    Dr. Kalman Shan, M.D., F.C.C.P,  Pulmonary and Critical Care Medicine Staff Physician, [facility] Center Director - Interstitial Lung Disease  Program  Pulmonary Fibrosis [facility] at [facility] [place], [place], [address]   Pager: [phone], If no answer  -> Check AMION or Try [phone] Telephone (clinical office): [phone] Telephone (research): [phone]  3:03 PM 01/01/2024

## 2024-01-01 NOTE — Progress Notes (Signed)
 Patient ID: [name], female    DOB: January 08, 1966  MRN: [id number]  CC: Chronic Conditions Follow-Up  Subjective: [name] is a 58 y.o. female who presents for chronic conditions follow-up.   Her concerns today include:  - States doesn't feel Buspirone is helping. She denies thoughts of self-harm, suicidal ideations, homicidal ideations. She declines referral to Psychiatry. - States Cardiology told her to get Rosuvastatin refills from Primary Care. Doing well on Rosuvastatin, no issues/concerns.  - States doesn't feel Gabapentin is helping. Denies red flag symptoms. - States doesn't feel Trazodone is helping.  - Doing well on Ondansetron, no issues/concerns.  - States in the past she was seen by a specialist for thyroid nodules and did not follow-up since then. - Reports hair loss and would like to see a specialist.  - Reports history of skin cancer. - No further issues/concerns for discussion today.   Patient Active Problem List   Diagnosis Date Noted   Interstitial lung disease (HCC) 10/09/2023   Iron deficiency 12/30/2022   Vitamin D deficiency 08/19/2022   Urinary incontinence 04/09/2022   ILD (interstitial lung disease) (HCC) 06/27/2021   Unstable angina (HCC) 04/13/2021   Atypical nevi 03/17/2021   Atherosclerosis 03/08/2021   Grief reaction 03/02/2021   Acute diarrhea 12/29/2020   Acute right lumbar radiculopathy 12/11/2020   Encounter for smoking cessation counseling 12/11/2020   Acute lumbar back pain 07/25/2020   COPD (chronic obstructive pulmonary disease) (HCC) 06/01/2020   GAD (generalized anxiety disorder) 12/26/2019   Poor social situation 08/29/2019   Abnormal chest CT 08/29/2019   Poor dentition 06/25/2019   Lung nodule, solitary 06/25/2019   Chronic cough 11/26/2018   Recent unintentional weight loss over several months 11/05/2018   Current smoker 11/05/2018   Paresthesia 07/22/2018   Skull deformity 07/22/2018   Hyperlipidemia 07/22/2018   MDD  (major depressive disorder) 01/09/2012   Migraine with aura 05/09/2007   Mitral valve prolapse 05/09/2007     Current Outpatient Medications on File Prior to Visit  Medication Sig Dispense Refill   FLUCELVAX 0.5 ML injection      HEPLISAV-B injection      albuterol (PROVENTIL) (2.5 MG/3ML) 0.083% nebulizer solution INHALE 3 ML BY NEBULIZATION EVERY 6 HOURS AS NEEDED FOR WHEEZING OR SHORTNESS OF BREATH 75 mL 5   aspirin 81 MG chewable tablet Chew 81 mg by mouth daily.     Budeson-Glycopyrrol-Formoterol (BREZTRI AEROSPHERE) 160-9-4.8 MCG/ACT AERO Inhale 2 puffs into the lungs in the morning and at bedtime. 10.7 g 11   ezetimibe (ZETIA) 10 MG tablet Take 1 tablet (10 mg total) by mouth daily. 90 tablet 3   linaclotide (LINZESS) 145 MCG CAPS capsule Take 1 capsule (145 mcg total) by mouth daily as needed (Constipation).     methocarbamol (ROBAXIN) 500 MG tablet Take 1 tablet (500 mg total) by mouth every 8 (eight) hours as needed for muscle spasms. 12 tablet 0   nicotine (NICODERM CQ) 14 mg/24hr patch Place 1 patch (14 mg total) onto the skin daily. 30 patch 2   nitroGLYCERIN (NITROSTAT) 0.4 MG SL tablet Place 1 tablet (0.4 mg total) under the tongue every 5 (five) minutes as needed for chest pain. 25 tablet 1   omeprazole (PRILOSEC) 20 MG capsule TAKE 1 CAPSULE BY MOUTH EVERY DAY 90 capsule 1   oxyCODONE (OXY IR/ROXICODONE) 5 MG immediate release tablet Take 1 tablet (5 mg total) by mouth every 6 (six) hours as needed for severe pain (pain score 7-10).  28 tablet 0   ranolazine (RANEXA) 500 MG 12 hr tablet Take 1 tablet (500 mg total) by mouth 2 (two) times daily. 180 tablet 3   SUMAtriptan (IMITREX) 6 MG/0.5ML SOLN injection Inject 0.5 mLs (6 mg total) into the skin every 2 (two) hours as needed for migraine or headache. May repeat in 2 hours if headache persists or recurs.  Limit to twice per week. 0.5 mL 8   VENTOLIN HFA 108 (90 Base) MCG/ACT inhaler INHALE 1-2 PUFFS BY MOUTH EVERY 6 HOURS AS  NEEDED FOR WHEEZE OR SHORTNESS OF BREATH 18 each 2   Vitamin D, Ergocalciferol, (DRISDOL) 1.25 MG (50000 UNIT) CAPS capsule Take 1 capsule (50,000 Units total) by mouth once a week. 5 capsule 0   No current facility-administered medications on file prior to visit.    Allergies  Allergen Reactions   Dextromethorphan Other (See Comments)    keeps awake, legs constantly moving    Doxylamine Other (See Comments)    keeps awake, legs constantly moving    Pseudoeph-Doxylamine-Dm-Apap Other (See Comments)    REACTION: keeps awake, legs constantly moving  NYQUIL   Pseudoephedrine Hcl Other (See Comments)    keeps awake, legs constantly moving     Social History   Socioeconomic History   Marital status: Divorced    Spouse name: Not on file   Number of children: 2   Years of education: 12+2   Highest education level: Associate degree: occupational, Scientist, product/process development, or vocational program  Occupational History    Comment: Disabled  Tobacco Use   Smoking status: Former    Current packs/day: 1.00    Average packs/day: 1 pack/day for 34.0 years (34.0 ttl pk-yrs)    Types: Cigarettes    Passive exposure: Current   Smokeless tobacco: Never   Tobacco comments:    0.5-1ppd as of 07/03/23 Tay  Vaping Use   Vaping status: Never Used  Substance and Sexual Activity   Alcohol use: Yes    Comment: 1-2 times monthly   Drug use: No   Sexual activity: Yes    Partners: Male    Birth control/protection: Surgical  Other Topics Concern   Not on file  Social History Narrative   ** Merged History Encounter **       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  Trying to get disability. Lives with mom.  Highest level of education:  High school   Social Drivers of Health   Financial Resource Strain: Medium Risk (01/01/2024)   Overall Financial Resource Strain (CARDIA)    Difficulty of Paying Living Expenses: Somewhat hard  Food Insecurity: No Food Insecurity (01/01/2024)   Hunger Vital Sign     Worried About Running Out of Food in the Last Year: Never true    Ran Out of Food in the Last Year: Never true  Transportation Needs: No Transportation Needs (01/01/2024)   PRAPARE - Administrator, Civil Service (Medical): No    Lack of Transportation (Non-Medical): No  Physical Activity: Unknown (01/01/2024)   Exercise Vital Sign    Days of Exercise per Week: 0 days    Minutes of Exercise per Session: Not on file  Stress: Stress Concern Present (01/01/2024)   [employer] of Occupational Health - Occupational Stress Questionnaire    Feeling of Stress : Rather much  Social Connections: Moderately Isolated (01/01/2024)   Social Connection and Isolation Panel [NHANES]    Frequency of Communication with Friends and Family: More than three  times a week    Frequency of Social Gatherings with Friends and Family: Once a week    Attends Religious Services: 1 to 4 times per year    Active Member of [employer] or Organizations: No    Attends Engineer, structural: Not on file    Marital Status: Divorced  Intimate Partner Violence: Not At Risk (10/09/2023)   Humiliation, Afraid, Rape, and Kick questionnaire    Fear of Current or Ex-Partner: No    Emotionally Abused: No    Physically Abused: No    Sexually Abused: No    Family History  Problem Relation Age of Onset   Diabetes Mother    Heart disease Mother    Hyperlipidemia Mother    Colon polyps Mother    Tuberculosis Father    Alcohol abuse Father    Heart disease Sister    Hyperlipidemia Sister    Heart disease Brother    Hyperlipidemia Brother    Diabetes Maternal Grandmother    Diabetes Maternal Grandfather    Colon cancer Maternal Uncle    Stomach cancer Maternal Uncle     Past Surgical History:  Procedure Laterality Date   ANKLE SURGERY     RIGHT   APPENDECTOMY     AUGMENTATION MAMMAPLASTY     BREAST ENHANCEMENT SURGERY     SALINE   BUNIONECTOMY     BILATERAL FEET   INTERCOSTAL NERVE BLOCK Right  10/09/2023   Procedure: INTERCOSTAL NERVE BLOCK;  Surgeon: Corliss Skains, MD;  Location: [facility] OR;  Service: Thoracic;  Laterality: Right;   LEFT HEART CATH AND CORONARY ANGIOGRAPHY N/A 04/13/2021   Procedure: LEFT HEART CATH AND CORONARY ANGIOGRAPHY;  Surgeon: Marykay Lex, MD;  Location: [facility] INVASIVE CV LAB;  Service: Cardiovascular;  Laterality: N/A;   TOOTH EXTRACTION N/A 07/11/2021   Procedure: DENTAL RESTORATION / EXTRACTIONS x 13;  Surgeon: Ocie Doyne, DMD;  Location: [facility];  Service: Oral Surgery;  Laterality: N/A;   TOTAL ABDOMINAL HYSTERECTOMY W/ BILATERAL SALPINGOOPHORECTOMY     TVT  10/30/2005   WISDOM TOOTH EXTRACTION     X 4    ROS: Review of Systems Negative except as stated above  PHYSICAL EXAM: BP (!) 108/57   Pulse 70   Temp 98.1 F (36.7 C) (Oral)   Resp 16   Wt 124 lb 3.2 oz (56.3 kg)   SpO2 96%   BMI 24.26 kg/m   Physical Exam HENT:     Head: Normocephalic and atraumatic.     Nose: Nose normal.     Mouth/Throat:     Mouth: Mucous membranes are moist.     Pharynx: Oropharynx is clear.  Eyes:     Extraocular Movements: Extraocular movements intact.     Conjunctiva/sclera: Conjunctivae normal.     Pupils: Pupils are equal, round, and reactive to light.  Cardiovascular:     Rate and Rhythm: Normal rate and regular rhythm.     Pulses: Normal pulses.     Heart sounds: Normal heart sounds.  Pulmonary:     Effort: Pulmonary effort is normal.     Breath sounds: Normal breath sounds.  Musculoskeletal:        General: Normal range of motion.     Cervical back: Normal range of motion and neck supple.  Neurological:     General: No focal deficit present.     Mental Status: She is alert and oriented to person, place, and time.  Psychiatric:  Mood and Affect: Mood normal.        Behavior: Behavior normal.     ASSESSMENT AND PLAN: 1. Anxiety and depression (Primary) - Patient denies thoughts of self-harm, suicidal  ideations, homicidal ideations. - Increase Buspirone from 15 mg twice daily to 30 mg twice daily as prescribed. Counseled on medication adherence/adverse effects. - Hydroxyzine as prescribed. Counseled on medication adherence/adverse effects.  - Patient declined referral to Psychiatry.  - Follow-up with primary provider in 4 weeks or sooner if needed. - busPIRone (BUSPAR) 30 MG tablet; Take 1 tablet (30 mg total) by mouth 2 (two) times daily.  Dispense: 60 tablet; Refill: 2 - hydrOXYzine (VISTARIL) 25 MG capsule; Take 1 capsule (25 mg total) by mouth every 8 (eight) hours as needed.  Dispense: 30 capsule; Refill: 1  2. Hyperlipidemia, unspecified hyperlipidemia type - Continue Rosuvastatin as prescribed. Counseled on medication adherence/adverse effects.  - Keep all scheduled appointments with Cardiology. - Follow-up with primary provider as scheduled. - rosuvastatin (CRESTOR) 40 MG tablet; Take 1 tablet (40 mg total) by mouth daily.  Dispense: 90 tablet; Refill: 0  3. Paresthesia - Increase Gabapentin from 300 mg twice daily to 300 mg three times daily. Counseled on medication adherence/adverse effects. - Referral to Neurology for evaluation/management.  - Follow-up with primary provider as scheduled. - gabapentin (NEURONTIN) 300 MG capsule; Take 1 capsule (300 mg total) by mouth 3 (three) times daily.  Dispense: 90 capsule; Refill: 2 - Ambulatory referral to Neurology  4. Insomnia, unspecified type - Increase Trazodone from 50 mg to 100 mg as prescribed. Counseled on medication adherence/adverse effects.  - Follow-up with primary provider in 4 weeks or sooner if needed.  - traZODone (DESYREL) 100 MG tablet; Take 1 tablet (100 mg total) by mouth at bedtime.  Dispense: 30 tablet; Refill: 2  5. Chronic nausea - Continue Ondansetron as prescribed. Counseled on medication adherence/adverse effects.  - Follow-up with primary provider as scheduled. - ondansetron (ZOFRAN-ODT) 4 MG  disintegrating tablet; Take 1 tablet (4 mg total) by mouth every 8 (eight) hours as needed for nausea or vomiting.  Dispense: 30 tablet; Refill: 2  6. History of thyroid nodule - Routine screening.  - Referral to Endocrinology for evaluation/management. - TSH - Ambulatory referral to Endocrinology  7. History of skin cancer 8. Hair loss - Referral to Dermatology for evaluation/management. - Ambulatory referral to Dermatology    Patient was given the opportunity to ask questions.  Patient verbalized understanding of the plan and was able to repeat key elements of the plan. Patient was given clear instructions to go to Emergency Department or return to medical center if symptoms don't improve, worsen, or new problems develop.The patient verbalized understanding.   Orders Placed This Encounter  Procedures   TSH   Ambulatory referral to Neurology   Ambulatory referral to Endocrinology   Ambulatory referral to Dermatology     Requested Prescriptions   Signed Prescriptions Disp Refills   busPIRone (BUSPAR) 30 MG tablet 60 tablet 2    Sig: Take 1 tablet (30 mg total) by mouth 2 (two) times daily.   gabapentin (NEURONTIN) 300 MG capsule 90 capsule 2    Sig: Take 1 capsule (300 mg total) by mouth 3 (three) times daily.   ondansetron (ZOFRAN-ODT) 4 MG disintegrating tablet 30 tablet 2    Sig: Take 1 tablet (4 mg total) by mouth every 8 (eight) hours as needed for nausea or vomiting.   rosuvastatin (CRESTOR) 40 MG tablet 90 tablet 0  Sig: Take 1 tablet (40 mg total) by mouth daily.   traZODone (DESYREL) 100 MG tablet 30 tablet 2    Sig: Take 1 tablet (100 mg total) by mouth at bedtime.   hydrOXYzine (VISTARIL) 25 MG capsule 30 capsule 1    Sig: Take 1 capsule (25 mg total) by mouth every 8 (eight) hours as needed.    Return in about 4 weeks (around 01/29/2024) for Follow-Up or next available chronic conditions.  [name], NP

## 2024-01-01 NOTE — Progress Notes (Signed)
 Patient is here for medication change. Patient says some of the medication is not working for her.

## 2024-01-02 ENCOUNTER — Telehealth: Payer: Self-pay

## 2024-01-02 ENCOUNTER — Encounter: Payer: Self-pay | Admitting: Family

## 2024-01-02 ENCOUNTER — Other Ambulatory Visit (HOSPITAL_COMMUNITY): Payer: Self-pay

## 2024-01-02 LAB — TSH: TSH: 4 u[IU]/mL (ref 0.450–4.500)

## 2024-01-02 NOTE — Telephone Encounter (Signed)
 PA request has been Submitted. New Encounter has been or will be created for follow up. For additional info see Pharmacy Prior Auth telephone encounter from 01-02-2024.

## 2024-01-02 NOTE — Telephone Encounter (Signed)
 Pharmacy Patient Advocate Encounter   Received notification from CoverMyMeds that prior authorization for oxyCODONE HCl 5MG  tablets is required/requested.   Insurance verification completed.   The patient is insured through [facility] .   Per test claim: PA required; PA submitted to above mentioned insurance via CoverMyMeds Key/confirmation #/EOC [id number] Status is pending

## 2024-01-02 NOTE — Telephone Encounter (Signed)
 PA already submitted and is pending. Duplicate encounter.

## 2024-01-02 NOTE — Telephone Encounter (Signed)
 Pharmacy Patient Advocate Encounter  Received notification from [facility] that Prior Authorization for oxyCODONE HCl 5MG  tablets has been APPROVED from 01-02-2024 to 06-30-2024   PA #/Case ID/Reference #: [id number]

## 2024-01-03 ENCOUNTER — Ambulatory Visit: Payer: Medicaid Other

## 2024-01-03 DIAGNOSIS — J449 Chronic obstructive pulmonary disease, unspecified: Secondary | ICD-10-CM | POA: Diagnosis not present

## 2024-01-03 DIAGNOSIS — R32 Unspecified urinary incontinence: Secondary | ICD-10-CM | POA: Diagnosis not present

## 2024-01-09 ENCOUNTER — Other Ambulatory Visit (HOSPITAL_COMMUNITY): Payer: Self-pay

## 2024-01-09 MED ORDER — OFEV 100 MG PO CAPS: 100.0000 mg | ORAL_CAPSULE | Freq: Two times a day (BID) | ORAL | 1 refills | Status: DC

## 2024-01-09 NOTE — Telephone Encounter (Signed)
 PA request was cancelled stating that medication is available without authorization.  Per test claim, copay for 90 days supply is $4.00.  Patient can fill through  any pharmacy able to dispense Ofev ([facility], [facility], [facility], etc)

## 2024-01-09 NOTE — Telephone Encounter (Signed)
 Rx for Ofev 100mg  twice daily sent to [facility] today. Patient advised to reach out no sooner than Friday 01/11/2024  MyChart message sent  Chesley Mires, PharmD, MPH, BCPS, CPP Clinical Pharmacist (Rheumatology and Pulmonology)

## 2024-01-10 ENCOUNTER — Ambulatory Visit: Payer: Medicaid Other | Admitting: Nurse Practitioner

## 2024-01-10 NOTE — Progress Notes (Deleted)
 [address]       [place] [address]             [phone]   HPI: This is a 58 year old female who is s/p robotic assisted right video thoracoscopy, wedge resection of the upper, middle and lower lobes, and intercostal nerve block by Dr. Cliffton Asters on 10/09/2023. 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 She was discharged on 10/12/2023. She presents today for routine post op follow up.   Current Outpatient Medications  Medication Sig Dispense Refill   albuterol (PROVENTIL) (2.5 MG/3ML) 0.083% nebulizer solution INHALE 3 ML BY NEBULIZATION EVERY 6 HOURS AS NEEDED FOR WHEEZING OR SHORTNESS OF BREATH 75 mL 5   aspirin 81 MG chewable tablet Chew 81 mg by mouth daily.     Budeson-Glycopyrrol-Formoterol (BREZTRI AEROSPHERE) 160-9-4.8 MCG/ACT AERO Inhale 2 puffs into the lungs in the morning and at bedtime. 10.7 g 11   busPIRone (BUSPAR) 15 MG tablet Take 1 tablet (15 mg total) by mouth 2 (two) times daily.     ezetimibe (ZETIA) 10 MG tablet Take 1 tablet (10 mg total) by mouth daily. 90 tablet 3   gabapentin (NEURONTIN) 300 MG capsule TAKE 1 CAPSULE BY MOUTH TWICE A DAY 60 capsule 2   linaclotide (LINZESS) 145 MCG CAPS capsule Take 1 capsule (145 mcg total) by mouth daily as needed (Constipation).     methocarbamol (ROBAXIN) 500 MG tablet Take 1 tablet (500 mg total) by mouth every 8 (eight) hours as needed for muscle spasms. 12 tablet 0   nicotine (NICODERM CQ) 14 mg/24hr patch Place 1 patch (14 mg total) onto the skin daily. 30 patch 2   nitroGLYCERIN (NITROSTAT) 0.4 MG SL tablet Place 1 tablet (0.4 mg total) under the tongue every 5 (five) minutes as needed for chest pain. 25 tablet 1   omeprazole (PRILOSEC) 20 MG capsule TAKE 1 CAPSULE BY MOUTH EVERY DAY 90 capsule 1   ondansetron (ZOFRAN-ODT)  4 MG disintegrating tablet Take 1 tablet (4 mg total) by mouth daily.     oxyCODONE (OXY IR/ROXICODONE) 5 MG immediate release tablet Take 1 tablet (5 mg total) by mouth every 6 (six) hours as needed for severe pain (pain score 7-10). 28 tablet 0   ranolazine (RANEXA) 500 MG 12 hr tablet Take 1 tablet (500 mg total) by mouth 2 (two) times daily. 180 tablet 3   rosuvastatin (CRESTOR) 40 MG tablet TAKE 1 TABLET BY MOUTH EVERY DAY 90 tablet 0   SUMAtriptan (IMITREX) 6 MG/0.5ML SOLN injection Inject 0.5 mLs (6 mg total) into the skin every 2 (two) hours as needed for migraine or headache. May repeat in 2 hours if headache persists or recurs.  Limit to twice per week. 0.5 mL 8   traZODone (DESYREL) 50 MG tablet Take 1 tablet (50 mg total) by mouth at bedtime.     VENTOLIN HFA 108 (90 Base) MCG/ACT inhaler INHALE 1-2 PUFFS BY MOUTH EVERY 6 HOURS AS NEEDED FOR WHEEZE OR SHORTNESS OF BREATH 18 each 2   Vitamin D, Ergocalciferol, (DRISDOL) 1.25 MG (50000 UNIT) CAPS capsule Take 1 capsule (50,000 Units total) by mouth once a week. 5 capsule 0  Vital Signs:   Physical Exam: CV Pulmonary Extremities Wounds  Diagnostic Tests:   Impression and Plan: We reviewed today's chest x ray. We discussed she  may begin driving as long as she is not taking any narcotic for pain. She may return to her activities as done prior to surgery. She states she is ** smoking and was encouraged to continue cessation. She has a follow up to see pulmonologist on 03/04.

## 2024-01-18 ENCOUNTER — Telehealth: Admitting: Internal Medicine

## 2024-01-18 ENCOUNTER — Encounter: Payer: Self-pay | Admitting: Internal Medicine

## 2024-01-18 ENCOUNTER — Ambulatory Visit: Payer: Self-pay

## 2024-01-18 DIAGNOSIS — J449 Chronic obstructive pulmonary disease, unspecified: Secondary | ICD-10-CM

## 2024-01-18 DIAGNOSIS — J849 Interstitial pulmonary disease, unspecified: Secondary | ICD-10-CM | POA: Diagnosis not present

## 2024-01-18 DIAGNOSIS — J069 Acute upper respiratory infection, unspecified: Secondary | ICD-10-CM

## 2024-01-18 MED ORDER — PROMETHAZINE-DM 6.25-15 MG/5ML PO SYRP: 5.0000 mL | ORAL_SOLUTION | Freq: Four times a day (QID) | ORAL | 0 refills | Status: DC | PRN

## 2024-01-18 NOTE — Progress Notes (Signed)
 Virtual Visit via Video Note  I connected with [name] on 01/18/24 at  2:00 PM EDT by a video enabled telemedicine application and verified that I am speaking with the correct person using two identifiers.  Location: Patient: Home Provider: Office  Person's participating in this video call: Natalie Reaper, NP-C and [name]   I discussed the limitations of evaluation and management by telemedicine and the availability of in person appointments. The patient expressed understanding and agreed to proceed.  History of Present Illness:   Discussed the use of AI scribe software for clinical note transcription with the patient, who gave verbal consent to proceed.   [name] is a 58 year old female with COPD and interstitial lung disease who presents with fever and cough.  She woke up today with a fever, initially measuring 103F, which decreased to 100.17F after taking Tylenol. She also experiences a headache, body aches, and sore ribs from frequent coughing. No nasal congestion, sore throat, ear pain, or shortness of breath. Symptoms began this morning.  She has a history of COPD and interstitial lung disease, specifically idiopathic pulmonary fibrosis (IPF). Her current medications include albuterol, Breztri, and she uses a home nebulizer, which she reports has been helpful. She also takes Tylenol for fever management.  She recently traveled to [place] by airplane from March 11th to March 18th. She performed a home COVID test, which was negative, but has not undergone any flu testing due to lack of access and transportation issues.  She is allergic to dextromethorphan, which causes her legs to hurt and keeps her awake at night. She has previously used hydrocodone cough syrup without adverse effects.      Past Medical History:  Diagnosis Date   Allergy    Anemia    during pregnancies   Anxiety    Arthritis    hands and hip   Cancer (HCC)    CAP (community acquired pneumonia)  01/07/2020   Chronic kidney disease    KIDNEYSTONES   Cigarette smoker 11/05/2018   COPD (chronic obstructive pulmonary disease) (HCC)    Depression    Heart murmur    MITRAL VALVE PROLASP   High cholesterol    History of endometriosis    Incontinence    Low back pain radiating to both legs 07/25/2020   Menopausal symptoms    Menopause 01/09/2012   Migraine    Myocardial infarction (HCC)    silent 3 years ago   Night sweats 10/05/2019   Osteoporosis    Paresthesia of foot, bilateral 03/28/2019   Productive cough 12/25/2019   Renal stone 06/01/2020   Skin cancer    Thyroid disease    Tick bite 02/10/2019    Current Outpatient Medications  Medication Sig Dispense Refill   albuterol (PROVENTIL) (2.5 MG/3ML) 0.083% nebulizer solution INHALE 3 ML BY NEBULIZATION EVERY 6 HOURS AS NEEDED FOR WHEEZING OR SHORTNESS OF BREATH 75 mL 5   aspirin 81 MG chewable tablet Chew 81 mg by mouth daily.     Budeson-Glycopyrrol-Formoterol (BREZTRI AEROSPHERE) 160-9-4.8 MCG/ACT AERO Inhale 2 puffs into the lungs in the morning and at bedtime. 10.7 g 11   busPIRone (BUSPAR) 30 MG tablet Take 1 tablet (30 mg total) by mouth 2 (two) times daily. 60 tablet 2   ezetimibe (ZETIA) 10 MG tablet Take 1 tablet (10 mg total) by mouth daily. 90 tablet 3   gabapentin (NEURONTIN) 300 MG capsule Take 1 capsule (300 mg total) by mouth 3 (three) times  daily. 90 capsule 2   HEPLISAV-B injection      hydrOXYzine (VISTARIL) 25 MG capsule Take 1 capsule (25 mg total) by mouth every 8 (eight) hours as needed. 30 capsule 1   linaclotide (LINZESS) 145 MCG CAPS capsule Take 1 capsule (145 mcg total) by mouth daily as needed (Constipation).     nicotine (NICODERM CQ) 14 mg/24hr patch Place 1 patch (14 mg total) onto the skin daily. 30 patch 2   Nintedanib (OFEV) 100 MG CAPS Take 1 capsule (100 mg total) by mouth 2 (two) times daily. 180 capsule 1   nitroGLYCERIN (NITROSTAT) 0.4 MG SL tablet Place 1 tablet (0.4 mg total) under  the tongue every 5 (five) minutes as needed for chest pain. 25 tablet 1   omeprazole (PRILOSEC) 20 MG capsule TAKE 1 CAPSULE BY MOUTH EVERY DAY 90 capsule 1   ondansetron (ZOFRAN-ODT) 4 MG disintegrating tablet Take 1 tablet (4 mg total) by mouth every 8 (eight) hours as needed for nausea or vomiting. 30 tablet 2   ranolazine (RANEXA) 500 MG 12 hr tablet Take 1 tablet (500 mg total) by mouth 2 (two) times daily. 180 tablet 3   rosuvastatin (CRESTOR) 40 MG tablet Take 1 tablet (40 mg total) by mouth daily. 90 tablet 0   SUMAtriptan (IMITREX) 6 MG/0.5ML SOLN injection Inject 0.5 mLs (6 mg total) into the skin every 2 (two) hours as needed for migraine or headache. May repeat in 2 hours if headache persists or recurs.  Limit to twice per week. 0.5 mL 8   traZODone (DESYREL) 100 MG tablet Take 1 tablet (100 mg total) by mouth at bedtime. 30 tablet 2   VENTOLIN HFA 108 (90 Base) MCG/ACT inhaler INHALE 1-2 PUFFS BY MOUTH EVERY 6 HOURS AS NEEDED FOR WHEEZE OR SHORTNESS OF BREATH 18 each 2   No current facility-administered medications for this visit.    Allergies  Allergen Reactions   Dextromethorphan Other (See Comments)    keeps awake, legs constantly moving    Doxylamine Other (See Comments)    keeps awake, legs constantly moving    Pseudoeph-Doxylamine-Dm-Apap Other (See Comments)    REACTION: keeps awake, legs constantly moving  NYQUIL   Pseudoephedrine Hcl Other (See Comments)    keeps awake, legs constantly moving     Family History  Problem Relation Age of Onset   Diabetes Mother    Heart disease Mother    Hyperlipidemia Mother    Colon polyps Mother    Tuberculosis Father    Alcohol abuse Father    Heart disease Sister    Hyperlipidemia Sister    Heart disease Brother    Hyperlipidemia Brother    Diabetes Maternal Grandmother    Diabetes Maternal Grandfather    Colon cancer Maternal Uncle    Stomach cancer Maternal Uncle     Social History   Socioeconomic History    Marital status: Divorced    Spouse name: Not on file   Number of children: 2   Years of education: 12+2   Highest education level: Associate degree: occupational, Scientist, product/process development, or vocational program  Occupational History    Comment: Disabled  Tobacco Use   Smoking status: Former    Current packs/day: 1.00    Average packs/day: 1 pack/day for 34.0 years (34.0 ttl pk-yrs)    Types: Cigarettes    Passive exposure: Current   Smokeless tobacco: Never   Tobacco comments:    0.5-1ppd as of 07/03/23 [place]  Vaping Use   Vaping  status: Never Used  Substance and Sexual Activity   Alcohol use: Yes    Comment: 1-2 times monthly   Drug use: No   Sexual activity: Yes    Partners: Male    Birth control/protection: Surgical  Other Topics Concern   Not on file  Social History Narrative   ** Merged History Encounter **       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  Trying to get disability. Lives with mom.  Highest level of education:  High school   Social Drivers of Health   Financial Resource Strain: Medium Risk (01/01/2024)   Overall Financial Resource Strain (CARDIA)    Difficulty of Paying Living Expenses: Somewhat hard  Food Insecurity: No Food Insecurity (01/01/2024)   Hunger Vital Sign    Worried About Running Out of Food in the Last Year: Never true    Ran Out of Food in the Last Year: Never true  Transportation Needs: No Transportation Needs (01/01/2024)   PRAPARE - Administrator, Civil Service (Medical): No    Lack of Transportation (Non-Medical): No  Physical Activity: Unknown (01/01/2024)   Exercise Vital Sign    Days of Exercise per Week: 0 days    Minutes of Exercise per Session: Not on file  Stress: Stress Concern Present (01/01/2024)   [employer] of Occupational Health - Occupational Stress Questionnaire    Feeling of Stress : Rather much  Social Connections: Moderately Isolated (01/01/2024)   Social Connection and Isolation Panel [NHANES]    Frequency  of Communication with Friends and Family: More than three times a week    Frequency of Social Gatherings with Friends and Family: Once a week    Attends Religious Services: 1 to 4 times per year    Active Member of [employer] or Organizations: No    Attends Engineer, structural: Not on file    Marital Status: Divorced  Intimate Partner Violence: Not At Risk (10/09/2023)   Humiliation, Afraid, Rape, and Kick questionnaire    Fear of Current or Ex-Partner: No    Emotionally Abused: No    Physically Abused: No    Sexually Abused: No     Constitutional: Pt reports headache, fever and chills. Denies malaise, fatigue, or abrupt weight changes.  HEENT: Denies eye pain, eye redness, ear pain, ringing in the ears, wax buildup, runny nose, nasal congestion, bloody nose, or sore throat. Respiratory: Pt reports cough. Denies difficulty breathing, shortness of breath, or sputum production.   Cardiovascular: Denies chest pain, chest tightness, palpitations or swelling in the hands or feet.  Gastrointestinal: Denies abdominal pain, bloating, constipation, diarrhea or blood in the stool.  Musculoskeletal: Pt reports body aches. Denies decrease in range of motion, difficulty with gait, or joint swelling.  Neurological: Denies dizziness, difficulty with memory, difficulty with speech or problems with balance and coordination.    No other specific complaints in a complete review of systems (except as listed in HPI above).  Observations/Objective:    Wt Readings from Last 3 Encounters:  01/01/24 126 lb (57.2 kg)  01/01/24 124 lb 3.2 oz (56.3 kg)  11/15/23 123 lb 9.6 oz (56.1 kg)    General: Appears her stated age, in NAD. HEENT: Head: normal shape and size; Nose: No congestion noted; Throat/Mouth: Hoarse noted.  Pulmonary/Chest: Normal effort with dry cough noted. No respiratory distress.  Neurological: Alert and oriented.  BMET    Component Value Date/Time   NA 137 10/11/2023 0216  NA  141 10/01/2023 1448   K 3.5 10/11/2023 0216   CL 105 10/11/2023 0216   CO2 26 10/11/2023 0216   GLUCOSE 98 10/11/2023 0216   BUN 11 10/11/2023 0216   BUN 11 10/01/2023 1448   CREATININE 1.00 10/11/2023 0216   CREATININE 0.66 10/11/2011 1137   CALCIUM 8.6 (L) 10/11/2023 0216   GFRNONAA >60 10/11/2023 0216   GFRAA 106 12/10/2020 1114    Lipid Panel     Component Value Date/Time   CHOL 170 10/01/2023 1448   TRIG 59 10/01/2023 1448   HDL 76 10/01/2023 1448   CHOLHDL 2.2 10/01/2023 1448   CHOLHDL 5.9 10/11/2011 1137   VLDL 27 10/11/2011 1137   LDLCALC 82 10/01/2023 1448    CBC    Component Value Date/Time   WBC 8.9 10/11/2023 0216   RBC 3.60 (L) 10/11/2023 0216   HGB 10.5 (L) 10/11/2023 0216   HGB 13.3 10/01/2023 1448   HCT 32.0 (L) 10/11/2023 0216   HCT 41.3 10/01/2023 1448   PLT 252 10/11/2023 0216   PLT 309 10/01/2023 1448   MCV 88.9 10/11/2023 0216   MCV 90 10/01/2023 1448   MCH 29.2 10/11/2023 0216   MCHC 32.8 10/11/2023 0216   RDW 14.9 10/11/2023 0216   RDW 14.5 10/01/2023 1448   LYMPHSABS 1.9 07/11/2023 0931   LYMPHSABS 2.2 12/10/2020 1114   MONOABS 1.3 (H) 07/11/2023 0931   EOSABS 0.2 07/11/2023 0931   EOSABS 0.2 12/10/2020 1114   BASOSABS 0.1 07/11/2023 0931   BASOSABS 0.1 12/10/2020 1114    Hgb A1C Lab Results  Component Value Date   HGBA1C 5.4 07/22/2018       Assessment and Plan: Assessment and Plan    Viral URI with cough Acute febrile illness with fever, headache, and body aches. Differential includes viral infections like influenza or COVID-19. Negative home COVID test, but retesting advised. Bacterial infection unlikely. - Repeat COVID-19 testing daily for two days. - Consider influenza testing if symptoms persist. - Manage symptoms with acetaminophen. - Prescribe promethazine DM for nighttime cough.  She is aware that this has dextromethorphan in it and is willing to try it.   Chronic Obstructive Pulmonary Disease (COPD) and  Interstitial Pulmonary Fibrosis (IPF) COPD and IPF may complicate respiratory symptoms. Current inhaler and nebulizer use effective. Monitor for pneumonia risk. - Continue inhaler and nebulizer regimen. - Monitor for worsening symptoms. - Consider pulmonologist consultation if cough management is ineffective.        Follow Up Instructions:    I discussed the assessment and treatment plan with the patient. The patient was provided an opportunity to ask questions and all were answered. The patient agreed with the plan and demonstrated an understanding of the instructions.   The patient was advised to call back or seek an in-person evaluation if the symptoms worsen or if the condition fails to improve as anticipated.   Natalie Reaper, NP

## 2024-01-18 NOTE — Telephone Encounter (Signed)
 Spoke to patient and her insurance wont cover her cough syrup. Said she will talk to her lung doctor Monday if she is still having issues.

## 2024-01-18 NOTE — Telephone Encounter (Signed)
  Chief Complaint: flu like symptoms Symptoms: fever, cough, congestion, body aches, chills Frequency: this morning Pertinent Negatives: Patient denies hemoptysis, sore throat earaches. Disposition: [] ED /[] Urgent Care (no appt availability in office) / [x] Appointment(In office/virtual)/ []  [facility]/ [] Home Care/ [] Refused Recommended Disposition /[] [facility]/ []  Follow-up with PCP Additional Notes: Patient states she took a home COVID, and it was negative. She woke up with symptoms and is concerned if it could be the flu. Patient requesting virtual appt and states her car is currently broken down, unable to be see in office. Patient scheduled for acute virtual visit this afternoon with available provider. Patient verbalizes understanding to call back for worsening symptoms.  Reason for Disposition  [1] Continuous (nonstop) coughing interferes with work or school AND [2] no improvement using cough treatment per Care Advice  Answer Assessment - Initial Assessment Questions 1. ONSET: "When did the cough begin?"      Since this morning.  2. SEVERITY: "How bad is the cough today?"      She states it is starting to cause her ribs to hurt from the deep cough.  3. SPUTUM: "Describe the color of your sputum" (none, dry cough; clear, white, yellow, green)     Dry.  4. HEMOPTYSIS: "Are you coughing up any blood?" If so ask: "How much?" (flecks, streaks, tablespoons, etc.)     Denies.  5. DIFFICULTY BREATHING: "Are you having difficulty breathing?" If Yes, ask: "How bad is it?" (e.g., mild, moderate, severe)    - MILD: No SOB at rest, mild SOB with walking, speaks normally in sentences, can lie down, no retractions, pulse < 100.    - MODERATE: SOB at rest, SOB with minimal exertion and prefers to sit, cannot lie down flat, speaks in phrases, mild retractions, audible wheezing, pulse 100-120.    - SEVERE: Very SOB at rest, speaks in single words, struggling to breathe,  sitting hunched forward, retractions, pulse > 120      Denies.  6. FEVER: "Do you have a fever?" If Yes, ask: "What is your temperature, how was it measured, and when did it start?"     Yes, taken with oral thermometer just a few minutes ago and was 100.7  7. CARDIAC HISTORY: "Do you have any history of heart disease?" (e.g., heart attack, congestive heart failure)      Denies.  8. LUNG HISTORY: "Do you have any history of lung disease?"  (e.g., pulmonary embolus, asthma, emphysema)     IPF.  9. PE RISK FACTORS: "Do you have a history of blood clots?" (or: recent major surgery, recent prolonged travel, bedridden)     Denies.  10. OTHER SYMPTOMS: "Do you have any other symptoms?" (e.g., runny nose, wheezing, chest pain)       Body aches, chills, ribs hurting after coughing.  11. PREGNANCY: "Is there any chance you are pregnant?" "When was your last menstrual period?"       N/A.  12. TRAVEL: "Have you traveled out of the country in the last month?" (e.g., travel history, exposures)       Denies recent travel out of country. She states she did travel to [place] last week 11th-18th (flew on plane).  Protocols used: Cough - Acute Non-Productive-A-AH

## 2024-01-18 NOTE — Patient Instructions (Signed)

## 2024-01-21 ENCOUNTER — Ambulatory Visit

## 2024-01-24 ENCOUNTER — Other Ambulatory Visit: Payer: Self-pay | Admitting: Family

## 2024-01-24 DIAGNOSIS — G4700 Insomnia, unspecified: Secondary | ICD-10-CM

## 2024-01-24 DIAGNOSIS — F419 Anxiety disorder, unspecified: Secondary | ICD-10-CM

## 2024-01-25 ENCOUNTER — Other Ambulatory Visit: Payer: Self-pay | Admitting: Family

## 2024-01-25 DIAGNOSIS — G4700 Insomnia, unspecified: Secondary | ICD-10-CM

## 2024-01-25 DIAGNOSIS — F32A Anxiety disorder, unspecified: Secondary | ICD-10-CM

## 2024-01-25 MED ORDER — TRAZODONE HCL 100 MG PO TABS: 100.0000 mg | ORAL_TABLET | Freq: Every day | ORAL | 0 refills | Status: DC

## 2024-01-25 MED ORDER — BUSPIRONE HCL 30 MG PO TABS: 30.0000 mg | ORAL_TABLET | Freq: Two times a day (BID) | ORAL | 0 refills | Status: AC

## 2024-01-25 NOTE — Telephone Encounter (Signed)
 Complete

## 2024-01-27 ENCOUNTER — Encounter: Payer: Self-pay | Admitting: Cardiovascular Disease

## 2024-01-27 NOTE — Progress Notes (Signed)
 No show  This encounter was created in error - please disregard.

## 2024-01-29 ENCOUNTER — Ambulatory Visit: Attending: Cardiovascular Disease | Admitting: Cardiovascular Disease

## 2024-01-30 ENCOUNTER — Encounter

## 2024-01-31 ENCOUNTER — Encounter: Payer: Self-pay | Admitting: Cardiovascular Disease

## 2024-02-01 ENCOUNTER — Ambulatory Visit (INDEPENDENT_AMBULATORY_CARE_PROVIDER_SITE_OTHER)

## 2024-02-01 ENCOUNTER — Ambulatory Visit (INDEPENDENT_AMBULATORY_CARE_PROVIDER_SITE_OTHER): Admitting: Family

## 2024-02-01 VITALS — BP 134/84 | HR 67 | Temp 98.2°F | Ht 60.0 in | Wt 126.8 lb

## 2024-02-01 DIAGNOSIS — M25572 Pain in left ankle and joints of left foot: Secondary | ICD-10-CM | POA: Diagnosis not present

## 2024-02-01 DIAGNOSIS — Z131 Encounter for screening for diabetes mellitus: Secondary | ICD-10-CM | POA: Diagnosis not present

## 2024-02-01 DIAGNOSIS — Z0000 Encounter for general adult medical examination without abnormal findings: Secondary | ICD-10-CM | POA: Diagnosis not present

## 2024-02-01 DIAGNOSIS — G894 Chronic pain syndrome: Secondary | ICD-10-CM | POA: Diagnosis not present

## 2024-02-01 DIAGNOSIS — Z130 Encounter for screening for diseases of the blood and blood-forming organs and certain disorders involving the immune mechanism: Secondary | ICD-10-CM | POA: Diagnosis not present

## 2024-02-01 DIAGNOSIS — M19072 Primary osteoarthritis, left ankle and foot: Secondary | ICD-10-CM | POA: Diagnosis not present

## 2024-02-01 DIAGNOSIS — E785 Hyperlipidemia, unspecified: Secondary | ICD-10-CM | POA: Diagnosis not present

## 2024-02-01 DIAGNOSIS — Z1231 Encounter for screening mammogram for malignant neoplasm of breast: Secondary | ICD-10-CM | POA: Diagnosis not present

## 2024-02-01 DIAGNOSIS — J84112 Idiopathic pulmonary fibrosis: Secondary | ICD-10-CM

## 2024-02-01 DIAGNOSIS — Z13228 Encounter for screening for other metabolic disorders: Secondary | ICD-10-CM | POA: Diagnosis not present

## 2024-02-01 DIAGNOSIS — M85872 Other specified disorders of bone density and structure, left ankle and foot: Secondary | ICD-10-CM | POA: Diagnosis not present

## 2024-02-01 NOTE — Progress Notes (Signed)
 Patient thinks she sprained her left ankle. A lot of bruising.    Patient wants to discuss medication not working.  Lung specilist stated he wants you to refer her to pain management.

## 2024-02-01 NOTE — Progress Notes (Signed)
 Patient ID: [name], female    DOB: 1966-02-09  MRN: [id number]  CC: Annual Exam  Subjective: [name] is a 58 y.o. female who presents for annual exam.  Her concerns today include:  - States last night she was standing on a stool reaching into her closet when the stool moved from beneath her. She did not hit her head. States thinks she sprained her left ankle. Reports pain and bruising of left ankle. Denies red flag symptoms. Taking over-the-counter medications to help. - States Pulmonology told her that Primary Care should refer her to Pain Clinic due to IPF.  Patient Active Problem List   Diagnosis Date Noted   Interstitial lung disease (HCC) 10/09/2023   Iron deficiency 12/30/2022   Vitamin D deficiency 08/19/2022   Urinary incontinence 04/09/2022   ILD (interstitial lung disease) (HCC) 06/27/2021   Unstable angina (HCC) 04/13/2021   Atypical nevi 03/17/2021   Atherosclerosis 03/08/2021   Grief reaction 03/02/2021   Acute diarrhea 12/29/2020   Acute right lumbar radiculopathy 12/11/2020   Encounter for smoking cessation counseling 12/11/2020   Acute lumbar back pain 07/25/2020   COPD (chronic obstructive pulmonary disease) (HCC) 06/01/2020   GAD (generalized anxiety disorder) 12/26/2019   Poor social situation 08/29/2019   Abnormal chest CT 08/29/2019   Poor dentition 06/25/2019   Lung nodule, solitary 06/25/2019   Chronic cough 11/26/2018   Recent unintentional weight loss over several months 11/05/2018   Current smoker 11/05/2018   Paresthesia 07/22/2018   Skull deformity 07/22/2018   Hyperlipidemia 07/22/2018   MDD (major depressive disorder) 01/09/2012   Migraine with aura 05/09/2007   Mitral valve prolapse 05/09/2007     Current Outpatient Medications on File Prior to Visit  Medication Sig Dispense Refill   albuterol (PROVENTIL) (2.5 MG/3ML) 0.083% nebulizer solution INHALE 3 ML BY NEBULIZATION EVERY 6 HOURS AS NEEDED FOR WHEEZING OR SHORTNESS OF  BREATH 75 mL 5   aspirin 81 MG chewable tablet Chew 81 mg by mouth daily.     Budeson-Glycopyrrol-Formoterol (BREZTRI AEROSPHERE) 160-9-4.8 MCG/ACT AERO Inhale 2 puffs into the lungs in the morning and at bedtime. 10.7 g 11   busPIRone (BUSPAR) 30 MG tablet Take 1 tablet (30 mg total) by mouth 2 (two) times daily. 180 tablet 0   ezetimibe (ZETIA) 10 MG tablet Take 1 tablet (10 mg total) by mouth daily. 90 tablet 3   gabapentin (NEURONTIN) 300 MG capsule Take 1 capsule (300 mg total) by mouth 3 (three) times daily. 90 capsule 2   hydrOXYzine (VISTARIL) 25 MG capsule Take 1 capsule (25 mg total) by mouth every 8 (eight) hours as needed. 30 capsule 1   linaclotide (LINZESS) 145 MCG CAPS capsule Take 1 capsule (145 mcg total) by mouth daily as needed (Constipation).     nicotine (NICODERM CQ) 14 mg/24hr patch Place 1 patch (14 mg total) onto the skin daily. 30 patch 2   Nintedanib (OFEV) 100 MG CAPS Take 1 capsule (100 mg total) by mouth 2 (two) times daily. 180 capsule 1   nitroGLYCERIN (NITROSTAT) 0.4 MG SL tablet Place 1 tablet (0.4 mg total) under the tongue every 5 (five) minutes as needed for chest pain. 25 tablet 1   omeprazole (PRILOSEC) 20 MG capsule TAKE 1 CAPSULE BY MOUTH EVERY DAY 90 capsule 1   ondansetron (ZOFRAN-ODT) 4 MG disintegrating tablet Take 1 tablet (4 mg total) by mouth every 8 (eight) hours as needed for nausea or vomiting. 30 tablet 2   ranolazine (  RANEXA) 500 MG 12 hr tablet Take 1 tablet (500 mg total) by mouth 2 (two) times daily. 180 tablet 3   rosuvastatin (CRESTOR) 40 MG tablet Take 1 tablet (40 mg total) by mouth daily. 90 tablet 0   SUMAtriptan (IMITREX) 6 MG/0.5ML SOLN injection Inject 0.5 mLs (6 mg total) into the skin every 2 (two) hours as needed for migraine or headache. May repeat in 2 hours if headache persists or recurs.  Limit to twice per week. 0.5 mL 8   traZODone (DESYREL) 100 MG tablet Take 1 tablet (100 mg total) by mouth at bedtime. 90 tablet 0   VENTOLIN  HFA 108 (90 Base) MCG/ACT inhaler INHALE 1-2 PUFFS BY MOUTH EVERY 6 HOURS AS NEEDED FOR WHEEZE OR SHORTNESS OF BREATH 18 each 2   HEPLISAV-B injection  (Patient not taking: Reported on 02/01/2024)     promethazine-dextromethorphan (PROMETHAZINE-DM) 6.25-15 MG/5ML syrup Take 5 mLs by mouth 4 (four) times daily as needed. (Patient not taking: Reported on 02/01/2024) 118 mL 0   No current facility-administered medications on file prior to visit.    Allergies  Allergen Reactions   Dextromethorphan Other (See Comments)    keeps awake, legs constantly moving    Doxylamine Other (See Comments)    keeps awake, legs constantly moving    Pseudoeph-Doxylamine-Dm-Apap Other (See Comments)    REACTION: keeps awake, legs constantly moving  NYQUIL   Pseudoephedrine Hcl Other (See Comments)    keeps awake, legs constantly moving     Social History   Socioeconomic History   Marital status: Divorced    Spouse name: Not on file   Number of children: 2   Years of education: 12+2   Highest education level: Associate degree: occupational, Scientist, product/process development, or vocational program  Occupational History    Comment: Disabled  Tobacco Use   Smoking status: Former    Current packs/day: 1.00    Average packs/day: 1 pack/day for 34.0 years (34.0 ttl pk-yrs)    Types: Cigarettes    Passive exposure: Current   Smokeless tobacco: Never   Tobacco comments:    0.5-1ppd as of 07/03/23 Tay  Vaping Use   Vaping status: Never Used  Substance and Sexual Activity   Alcohol use: Yes    Comment: 1-2 times monthly   Drug use: No   Sexual activity: Yes    Partners: Male    Birth control/protection: Surgical  Other Topics Concern   Not on file  Social History Narrative   ** Merged History Encounter **       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  Trying to get disability. Lives with mom.  Highest level of education:  High school   Social Drivers of Health   Financial Resource Strain: Medium Risk  (01/01/2024)   Overall Financial Resource Strain (CARDIA)    Difficulty of Paying Living Expenses: Somewhat hard  Food Insecurity: No Food Insecurity (01/01/2024)   Hunger Vital Sign    Worried About Running Out of Food in the Last Year: Never true    Ran Out of Food in the Last Year: Never true  Transportation Needs: No Transportation Needs (01/01/2024)   PRAPARE - Administrator, Civil Service (Medical): No    Lack of Transportation (Non-Medical): No  Physical Activity: Unknown (01/01/2024)   Exercise Vital Sign    Days of Exercise per Week: 0 days    Minutes of Exercise per Session: Not on file  Stress: Stress Concern Present (01/01/2024)  [employer] of Occupational Health - Occupational Stress Questionnaire    Feeling of Stress : Rather much  Social Connections: Moderately Isolated (01/01/2024)   Social Connection and Isolation Panel [NHANES]    Frequency of Communication with Friends and Family: More than three times a week    Frequency of Social Gatherings with Friends and Family: Once a week    Attends Religious Services: 1 to 4 times per year    Active Member of [employer] or Organizations: No    Attends Engineer, structural: Not on file    Marital Status: Divorced  Intimate Partner Violence: Not At Risk (10/09/2023)   Humiliation, Afraid, Rape, and Kick questionnaire    Fear of Current or Ex-Partner: No    Emotionally Abused: No    Physically Abused: No    Sexually Abused: No    Family History  Problem Relation Age of Onset   Diabetes Mother    Heart disease Mother    Hyperlipidemia Mother    Colon polyps Mother    Tuberculosis Father    Alcohol abuse Father    Heart disease Sister    Hyperlipidemia Sister    Heart disease Brother    Hyperlipidemia Brother    Diabetes Maternal Grandmother    Diabetes Maternal Grandfather    Colon cancer Maternal Uncle    Stomach cancer Maternal Uncle     Past Surgical History:  Procedure Laterality Date    ANKLE SURGERY     RIGHT   APPENDECTOMY     AUGMENTATION MAMMAPLASTY     BREAST ENHANCEMENT SURGERY     SALINE   BUNIONECTOMY     BILATERAL FEET   INTERCOSTAL NERVE BLOCK Right 10/09/2023   Procedure: INTERCOSTAL NERVE BLOCK;  Surgeon: Corliss Skains, MD;  Location: [facility] OR;  Service: Thoracic;  Laterality: Right;   LEFT HEART CATH AND CORONARY ANGIOGRAPHY N/A 04/13/2021   Procedure: LEFT HEART CATH AND CORONARY ANGIOGRAPHY;  Surgeon: Marykay Lex, MD;  Location: [facility];  Service: Cardiovascular;  Laterality: N/A;   TOOTH EXTRACTION N/A 07/11/2021   Procedure: DENTAL RESTORATION / EXTRACTIONS x 13;  Surgeon: Ocie Doyne, DMD;  Location: [facility];  Service: Oral Surgery;  Laterality: N/A;   TOTAL ABDOMINAL HYSTERECTOMY W/ BILATERAL SALPINGOOPHORECTOMY     TVT  10/30/2005   WISDOM TOOTH EXTRACTION     X 4    ROS: Review of Systems Negative except as stated above  PHYSICAL EXAM: BP 134/84   Pulse 67   Temp 98.2 F (36.8 C) (Oral)   Ht 5' (1.524 m)   Wt 126 lb 12.8 oz (57.5 kg)   SpO2 97%   BMI 24.76 kg/m   Physical Exam HENT:     Head: Normocephalic and atraumatic.     Right Ear: Tympanic membrane, ear canal and external ear normal.     Left Ear: Tympanic membrane, ear canal and external ear normal.     Nose: Nose normal.     Mouth/Throat:     Mouth: Mucous membranes are moist.     Pharynx: Oropharynx is clear.  Eyes:     Extraocular Movements: Extraocular movements intact.     Conjunctiva/sclera: Conjunctivae normal.     Pupils: Pupils are equal, round, and reactive to light.  Neck:     Thyroid: No thyroid mass, thyromegaly or thyroid tenderness.  Cardiovascular:     Rate and Rhythm: Normal rate and regular rhythm.     Pulses: Normal pulses.  Heart sounds: Normal heart sounds.  Pulmonary:     Effort: Pulmonary effort is normal.     Breath sounds: Normal breath sounds.  Chest:     Comments: Patient declined. Abdominal:      General: Bowel sounds are normal.     Palpations: Abdomen is soft.  Genitourinary:    Comments: Patient declined. Musculoskeletal:        General: Normal range of motion.     Right shoulder: Normal.     Left shoulder: Normal.     Right upper arm: Normal.     Left upper arm: Normal.     Right elbow: Normal.     Left elbow: Normal.     Right forearm: Normal.     Left forearm: Normal.     Right wrist: Normal.     Left wrist: Normal.     Right hand: Normal.     Left hand: Normal.     Cervical back: Normal, normal range of motion and neck supple.     Thoracic back: Normal.     Lumbar back: Normal.     Right hip: Normal.     Left hip: Normal.     Right upper leg: Normal.     Left upper leg: Normal.     Right knee: Normal.     Left knee: Normal.     Right lower leg: Normal.     Left lower leg: Normal.     Right ankle: Normal.     Left ankle: Swelling and ecchymosis present.     Right foot: Normal.     Left foot: Normal.  Skin:    General: Skin is warm and dry.     Capillary Refill: Capillary refill takes less than 2 seconds.  Neurological:     General: No focal deficit present.     Mental Status: She is alert and oriented to person, place, and time.  Psychiatric:        Mood and Affect: Mood normal.        Behavior: Behavior normal.    ASSESSMENT AND PLAN: 1. Annual physical exam (Primary) - Counseled on 150 minutes of exercise per week as tolerated, healthy eating (including decreased daily intake of saturated fats, cholesterol, added sugars, sodium), STI prevention, and routine healthcare maintenance.  2. Screening for metabolic disorder - Routine screening.  - CMP14+EGFR  3. Screening for deficiency anemia - Routine screening.  - CBC  4. Diabetes mellitus screening - Routine screening.  - Hemoglobin A1c  5. Hyperlipidemia, unspecified hyperlipidemia type - Routine screening.  - Lipid panel  6. Encounter for screening mammogram for malignant neoplasm of  breast - Routine screening.  - MM Digital Screening; Future  7. Chronic pain syndrome 8. IPF (idiopathic pulmonary fibrosis) (HCC) - Referral to Pain Clinic for evaluation/management. - Ambulatory referral to Pain Clinic  9. Left ankle pain, unspecified chronicity - Continue over-the-counter regimen and conservative therapy. - Diagnostic xray left ankle for evaluation. - Follow-up with primary provider as scheduled. - DG Ankle Complete Left; Future    Patient was given the opportunity to ask questions.  Patient verbalized understanding of the plan and was able to repeat key elements of the plan. Patient was given clear instructions to go to Emergency Department or return to medical center if symptoms don't improve, worsen, or new problems develop.The patient verbalized understanding.   Orders Placed This Encounter  Procedures   MM Digital Screening   DG Ankle Complete Left   CBC  Lipid panel   CMP14+EGFR   Hemoglobin A1c   Ambulatory referral to Pain Clinic    Return in about 1 year (around 01/31/2025) for Physical per patient preference.  [name], NP

## 2024-02-02 LAB — CMP14+EGFR
ALT: 51 IU/L — ABNORMAL HIGH (ref 0–32)
AST: 70 IU/L — ABNORMAL HIGH (ref 0–40)
Albumin: 4.3 g/dL (ref 3.8–4.9)
Alkaline Phosphatase: 89 IU/L (ref 44–121)
BUN/Creatinine Ratio: 13 (ref 9–23)
BUN: 11 mg/dL (ref 6–24)
Bilirubin Total: 0.4 mg/dL (ref 0.0–1.2)
CO2: 26 mmol/L (ref 20–29)
Calcium: 9.5 mg/dL (ref 8.7–10.2)
Chloride: 100 mmol/L (ref 96–106)
Creatinine, Ser: 0.88 mg/dL (ref 0.57–1.00)
Globulin, Total: 1.9 g/dL (ref 1.5–4.5)
Glucose: 93 mg/dL (ref 70–99)
Potassium: 4.1 mmol/L (ref 3.5–5.2)
Sodium: 141 mmol/L (ref 134–144)
Total Protein: 6.2 g/dL (ref 6.0–8.5)
eGFR: 77 mL/min/{1.73_m2} (ref >59)

## 2024-02-02 LAB — CBC
Hematocrit: 42 % (ref 34.0–46.6)
Hemoglobin: 13.8 g/dL (ref 11.1–15.9)
MCH: 29.2 pg (ref 26.6–33.0)
MCHC: 32.9 g/dL (ref 31.5–35.7)
MCV: 89 fL (ref 79–97)
Platelets: 204 10*3/uL (ref 150–450)
RBC: 4.73 x10E6/uL (ref 3.77–5.28)
RDW: 14.4 % (ref 11.7–15.4)
WBC: 5.9 10*3/uL (ref 3.4–10.8)

## 2024-02-02 LAB — LIPID PANEL
Chol/HDL Ratio: 1.9 ratio (ref 0.0–4.4)
Cholesterol, Total: 164 mg/dL (ref 100–199)
HDL: 85 mg/dL (ref >39)
LDL Chol Calc (NIH): 66 mg/dL (ref 0–99)
Triglycerides: 69 mg/dL (ref 0–149)
VLDL Cholesterol Cal: 13 mg/dL (ref 5–40)

## 2024-02-02 LAB — HEMOGLOBIN A1C
Est. average glucose Bld gHb Est-mCnc: 111 mg/dL
Hgb A1c MFr Bld: 5.5 % (ref 4.8–5.6)

## 2024-02-03 DIAGNOSIS — R32 Unspecified urinary incontinence: Secondary | ICD-10-CM | POA: Diagnosis not present

## 2024-02-03 DIAGNOSIS — J449 Chronic obstructive pulmonary disease, unspecified: Secondary | ICD-10-CM | POA: Diagnosis not present

## 2024-02-04 ENCOUNTER — Encounter: Payer: Self-pay | Admitting: Family

## 2024-02-04 ENCOUNTER — Other Ambulatory Visit: Payer: Self-pay | Admitting: Family

## 2024-02-04 DIAGNOSIS — R748 Abnormal levels of other serum enzymes: Secondary | ICD-10-CM

## 2024-02-04 NOTE — Progress Notes (Signed)
 I called patient and made her aware of lab results and Amy recommendations.Kidney function normal.  - No anemia.  - No diabetes.  - Cholesterol stable. Continue Rosuvastatin. Recheck routinely.   The following abnormalities are noted:   - Liver function above goal.   All other values are normal, stable or within acceptable limits.   Medication changes / Follow up labs / Other changes or recommendations:   - Referral to Gastroenterology for evaluation/management of liver function. Expect call soon with appointment details.

## 2024-02-13 ENCOUNTER — Telehealth: Payer: Self-pay

## 2024-02-13 NOTE — Telephone Encounter (Signed)
 Pt has a virtual appointment on [phone] ( tomorrow) with Thelbert Finner, NP to review PFT results. The pft is not scheduled until 04-03-24.   Beth- [employer]- please reschedule appointment unless we have openings tomorrow in office for this pft.

## 2024-02-13 NOTE — Telephone Encounter (Signed)
 Per last AVS notes Dr.Ramaswamy wanted a 6 week check with Beth for medication follow up  and a 12 week with him for and 12 weeks after spri/dlco with Ramaswamy 30 min. Leaving appointments as is, adjusting appt notes. Please let me know if this is not correct and I will follow thru w/resched. TY.

## 2024-02-14 ENCOUNTER — Encounter: Payer: Self-pay | Admitting: Primary Care

## 2024-02-14 ENCOUNTER — Telehealth: Admitting: Primary Care

## 2024-02-14 VITALS — BP 123/81 | HR 75 | Ht 60.0 in | Wt 130.0 lb

## 2024-02-14 DIAGNOSIS — J849 Interstitial pulmonary disease, unspecified: Secondary | ICD-10-CM

## 2024-02-14 DIAGNOSIS — J84112 Idiopathic pulmonary fibrosis: Secondary | ICD-10-CM | POA: Diagnosis not present

## 2024-02-14 MED ORDER — OXYCODONE HCL 5 MG PO TABS: 5.0000 mg | ORAL_TABLET | Freq: Two times a day (BID) | ORAL | 0 refills | Status: AC | PRN

## 2024-02-14 MED ORDER — BREZTRI AEROSPHERE 160-9-4.8 MCG/ACT IN AERO: 2.0000 | INHALATION_SPRAY | Freq: Two times a day (BID) | RESPIRATORY_TRACT | 11 refills | Status: AC

## 2024-02-14 NOTE — Progress Notes (Signed)
 Virtual Visit via Video Note  I connected with [name] on 02/14/24 at 11:30 AM EDT by a video enabled telemedicine application and verified that I am speaking with the correct person using two identifiers.  Location: Patient: Home Provider: Office    I discussed the limitations of evaluation and management by telemedicine and the availability of in person appointments. The patient expressed understanding and agreed to proceed.  History of Present Illness:    01/01/2024 -   Chief Complaint  Patient presents with   Follow-up    Having some right side pain after lung biopsy. She is getting over the flu- just finished tamiflu  on 12/27/23.      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 Ahmad Alert May 2023   #ILD   - GGO mild 2020  -> slightly more proinet 2022 -> ILD/?NSIP intdeterminate pattern 2023 and 2024 WORSE  -Surgical lung biopsy by Dr. 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 Annita Kindle   HPI [name] 58 y.o. -returns for follow-up after surgical lung biopsy.  She tells me that after the last visit in November 2024 she quit smoking.  Then in October 09, 2023 she underwent surgical lung biopsy by Dr. Deloise Ferries.  The results are showing UIP but there is also inflammation.  There is microscopic honeycombing.  This needs to be discussed in the case conference.  But at this point based on pathology of UIP and official CT read of probable UIP this would be IPF [I personally thought that she had CT features that were not fully  consistent with UIP and therefore we did the biopsy].  From a breathing standpoint she feels she is stable but her main complaint is postoperative pain.  She says she is very tender at the incisional area which is actually healed quite well.  It gets worse when she lies down or puts pressure on it or touches it.  Today's the first day she wore a bra.  Because it is extremely sensitive and tender.  She got some opioid prescriptions from Dr. Deloise Ferries but these are run out.  She wants me to fill that.  I have agreed to a temporary refill.  We discussed the possibility this is UIP/IPF and discussed antifibrotic therapy.  We discussed both pirfenidone and nintedanib.  She prefers nintedanib because of the easy schedule she understands diarrhea is a side effect but also requires drug-induced liver injury monitoring and understands the rare side effects   We also discussed clinical trials as a care option and she is interested.  We gave her IPF-pro registry consent.    ILD (interstitial lung disease) (HCC) IPF [idiopathic pulmonary fibrosis] -According to biopsy report 10/09/2023 the results are consistent with IPF - Plan shared decision making after extensive counseling -Check liver function test today -Start nintedanib 100 mg twice daily low-dose protocol             -Consider pirfenidone as second line -Consider clinical trials as a care option and glad you are interested             -  Meet Sheilda Deputy for enrollment in IPF-pro registry study -Will get your biopsy read at the [facility] clinic for a second opinion -Will get discussed on case conference   Postoperative chest pain following surgical lung biopsy   Plan  - Will give you a prescription for oxycodone  till the end of March 2025 but after this please follow this with Dr. Deloise Ferries     Emphysema   Plan   -conitnue Breztril daily  - continue albuterolas needed   Smoking - QUIT 09/06/23   -glad you quit 09/06/23   PLAN  - Stay in  remission.     Follow-up - 6 weeks nurse practitioner visit to discuss uptake with nintedanib -12 weeks perimetry and DLCO   02/14/2024- Interim hx  Discussed the use of AI scribe software for clinical note transcription with the patient, who gave verbal consent to proceed.  History of Present Illness   [name] is a 58 year old female with emphysema and interstitial lung disease who presents for follow-up on Ofev  treatment. She was referred by Dr. Bertrum Brodie for follow-up on her lung disease treatment.  She has a history of emphysema and interstitial lung disease, with a lung biopsy in December 2024 confirming idiopathic pulmonary fibrosis. She started Ofev  100 mg twice daily about three weeks ago. She has no difficulties taking the medication and no worsening of her chronic diarrhea, which she attributes to Linzess  use. She has gained weight since starting Ofev , now weighing 130 pounds, which she considers positive.  She experiences chronic nausea, managed with daily Zofran , and rates her diarrhea as a 2 out of 5. No vomiting is present. Fatigue is rated at 3 out of 5, anxiety and depression at 2 out of 5, and she reports no cough but clears her throat occasionally. She experiences no shortness of breath at rest, but rates it as 2 out of 5 during simple tasks and 3 out of 5 during household tasks and walking upstairs.  She has chronic rib pain on the right side since a biopsy in December, for which she was previously on a medication starting with 'O'. She mainly experiences pain at night when lying on her right side and is awaiting a pain management appointment at the end of the month.         SYMPTOM SCALE - Pulm 02/22/2021   06/09/2021   09/06/2023   01/01/2024 Postbiopsy 10/08/2024 02/14/2024   O2 use ra RA ra   RA  Shortness of Breath 0 -> 5 scale with 5 being worst (score 6 If unable to do)         At rest 2 2 2  0/5 0  Simple tasks - showers, clothes change, eating, shaving 2 3 2.5  2 2   Household (dishes, doing bed, laundry) 3.5 4 2.5 2.5 3  Shopping 4 4 3  2.5 NA  Walking level at own pace 2 3 3 2 2   Walking up Stairs 3 4 4  3.5 3  Total (30-36) Dyspnea Score 18.5 20 19 13 10   How bad is your cough? 4 3 Bad ribs hurts Not right now 0  How bad is your fatigue 5 5 All the time All te time 3  How bad is nausea 4 4 sometimes 5 5- chronic, takes zofran   How bad is vomiting?   2 2 no 2 0  How bad is diarrhea? 0 0 no 2 2  How bad is anxiety? 5 3 Not bad 4 2  How bad  is depression 5 3 Not bad 4 2    Observations/Objective:  No respiratory symptoms notes, able to speak in full sentences   Assessment and Plan:   1. IPF (idiopathic pulmonary fibrosis) (HCC) (Primary) - Hepatic function panel; Standing - budeson-glycopyrrolate -formoterol  (BREZTRI  AEROSPHERE) 160-9-4.8 MCG/ACT AERO inhaler; Inhale 2 puffs into the lungs in the morning and at bedtime.  Dispense: 10.7 g; Refill: 11 - oxyCODONE  (OXY IR/ROXICODONE ) 5 MG immediate release tablet; Take 1 tablet (5 mg total) by mouth every 12 (twelve) hours as needed for up to 7 days for severe pain (pain score 7-10).  Dispense: 14 tablet; Refill: 0   Assessment and Plan    Idiopathic Pulmonary Fibrosis (IPF) IPF confirmed by lung biopsy. Started on Ofev  100 mg twice daily in March 2025. Tolerating medication. Chronic diarrhea on Linzess . No new GI symptoms. Chronic nausea managed with Zofran . No weight loss, patient actually reports weight gain. Mild to moderate exertional dyspnea. Liver function monitoring required due to Ofev  medication - Continue Ofev  100 mg twice daily. - Standing order liver function tests monthly for May, June, July, then every three months.  Emphysema Emphysema managed with Breztri . No current smoking. - Refill Breztri  inhaler.  Chronic Pain Chronic right rib pain since lung biopsy. Awaiting pain management appointment. Pain primarily at night when lying on right side. - Prescribe one-week supply  of previous pain medication oxycodone  5mg  q 12 hours for moderate-severe pain - Advise follow-up with pain management for ongoing pain management.      Follow Up Instructions:   FU with Dr. Bertrum Brodie as scheduled  I discussed the assessment and treatment plan with the patient. The patient was provided an opportunity to ask questions and all were answered. The patient agreed with the plan and demonstrated an understanding of the instructions.   The patient was advised to call back or seek an in-person evaluation if the symptoms worsen or if the condition fails to improve as anticipated.  I provided 22 minutes of non-face-to-face time during this encounter.   Antonio Baumgarten, NP

## 2024-02-19 ENCOUNTER — Telehealth: Payer: Self-pay | Admitting: Family

## 2024-02-19 NOTE — Telephone Encounter (Signed)
 A document form from [facility] has been faxed:  Lab Request: Comprehensive Neurology Panel Test , to be filled out by provider. Send document back via Fax within 5-days. Document is located in providers tray at front office.          Fax number:  [phone]

## 2024-02-21 ENCOUNTER — Telehealth: Payer: Self-pay

## 2024-02-21 NOTE — Telephone Encounter (Signed)
 Copied from CRM [id number]. Topic: Clinical - Request for Lab/Test Order >> Feb 19, 2024  1:46 PM Rosamond Comes wrote: Reason for CRM: Lavonia Powers with [facility] phone [phone] inquired if we received a fax, that was faxed to [phone] regarding Lab request for Neurology test. I do not see where a fax has been entered into patients chart.  Mark asked to please be on the look out for this 4 page fax.

## 2024-02-21 NOTE — Telephone Encounter (Signed)
 Certified Medical Assistant to check on receipt of fax.

## 2024-02-22 ENCOUNTER — Ambulatory Visit

## 2024-02-22 ENCOUNTER — Telehealth: Payer: Self-pay | Admitting: Family

## 2024-02-22 DIAGNOSIS — S22070S Wedge compression fracture of T9-T10 vertebra, sequela: Secondary | ICD-10-CM | POA: Diagnosis not present

## 2024-02-22 DIAGNOSIS — J849 Interstitial pulmonary disease, unspecified: Secondary | ICD-10-CM | POA: Diagnosis not present

## 2024-02-22 DIAGNOSIS — M549 Dorsalgia, unspecified: Secondary | ICD-10-CM | POA: Diagnosis not present

## 2024-02-22 DIAGNOSIS — Z79899 Other long term (current) drug therapy: Secondary | ICD-10-CM | POA: Diagnosis not present

## 2024-02-22 DIAGNOSIS — M129 Arthropathy, unspecified: Secondary | ICD-10-CM | POA: Diagnosis not present

## 2024-02-22 NOTE — Telephone Encounter (Signed)
 noted

## 2024-02-22 NOTE — Telephone Encounter (Signed)
 error

## 2024-02-22 NOTE — Telephone Encounter (Signed)
 I called [facility] back and no one answer so I left a message to return my call. PCP is unable to fill out paperwork.

## 2024-02-22 NOTE — Telephone Encounter (Signed)
 PCP unable to fill out paperwork

## 2024-02-26 ENCOUNTER — Encounter

## 2024-02-29 ENCOUNTER — Ambulatory Visit

## 2024-02-29 DIAGNOSIS — M549 Dorsalgia, unspecified: Secondary | ICD-10-CM | POA: Diagnosis not present

## 2024-02-29 DIAGNOSIS — S22070S Wedge compression fracture of T9-T10 vertebra, sequela: Secondary | ICD-10-CM | POA: Diagnosis not present

## 2024-02-29 DIAGNOSIS — J849 Interstitial pulmonary disease, unspecified: Secondary | ICD-10-CM | POA: Diagnosis not present

## 2024-02-29 DIAGNOSIS — M5412 Radiculopathy, cervical region: Secondary | ICD-10-CM | POA: Diagnosis not present

## 2024-02-29 DIAGNOSIS — Z79899 Other long term (current) drug therapy: Secondary | ICD-10-CM | POA: Diagnosis not present

## 2024-03-01 ENCOUNTER — Other Ambulatory Visit: Payer: Self-pay | Admitting: Family

## 2024-03-01 DIAGNOSIS — K219 Gastro-esophageal reflux disease without esophagitis: Secondary | ICD-10-CM

## 2024-03-03 NOTE — Telephone Encounter (Signed)
 I called patient and made her aware Established with [facility] for management. Request refills from the same. Please let me know if I can further assist. Thank you.   Patient stated she does not need medication

## 2024-03-03 NOTE — Telephone Encounter (Signed)
 Established with [facility] for management. Request refills from the same. Please let me know if I can further assist. Thank you.

## 2024-03-05 DIAGNOSIS — R32 Unspecified urinary incontinence: Secondary | ICD-10-CM | POA: Diagnosis not present

## 2024-03-05 DIAGNOSIS — J449 Chronic obstructive pulmonary disease, unspecified: Secondary | ICD-10-CM | POA: Diagnosis not present

## 2024-03-07 ENCOUNTER — Ambulatory Visit

## 2024-03-29 ENCOUNTER — Other Ambulatory Visit: Payer: Self-pay | Admitting: Family

## 2024-03-29 DIAGNOSIS — E785 Hyperlipidemia, unspecified: Secondary | ICD-10-CM

## 2024-03-31 ENCOUNTER — Ambulatory Visit: Admitting: Neurology

## 2024-03-31 DIAGNOSIS — Z79899 Other long term (current) drug therapy: Secondary | ICD-10-CM | POA: Diagnosis not present

## 2024-03-31 DIAGNOSIS — S22070S Wedge compression fracture of T9-T10 vertebra, sequela: Secondary | ICD-10-CM | POA: Diagnosis not present

## 2024-03-31 DIAGNOSIS — M549 Dorsalgia, unspecified: Secondary | ICD-10-CM | POA: Diagnosis not present

## 2024-03-31 DIAGNOSIS — M5412 Radiculopathy, cervical region: Secondary | ICD-10-CM | POA: Diagnosis not present

## 2024-03-31 NOTE — Telephone Encounter (Signed)
 Requested Prescriptions  Pending Prescriptions Disp Refills   rosuvastatin  (CRESTOR ) 40 MG tablet [Pharmacy Med Name: ROSUVASTATIN  CALCIUM  40 MG TAB] 90 tablet 3    Sig: TAKE 1 TABLET BY MOUTH EVERY DAY     Cardiovascular:  Antilipid - Statins 2 Failed - 03/31/2024  1:53 PM      Failed - Lipid Panel in normal range within the last 12 months    Cholesterol, Total  Date Value Ref Range Status  02/01/2024 164 100 - 199 mg/dL Final   LDL Chol Calc (NIH)  Date Value Ref Range Status  02/01/2024 66 0 - 99 mg/dL Final   HDL  Date Value Ref Range Status  02/01/2024 85 >39 mg/dL Final   Triglycerides  Date Value Ref Range Status  02/01/2024 69 0 - 149 mg/dL Final         Passed - Cr in normal range and within 360 days    Creat  Date Value Ref Range Status  10/11/2011 0.66 0.50 - 1.10 mg/dL Final   Creatinine, Ser  Date Value Ref Range Status  02/01/2024 0.88 0.57 - 1.00 mg/dL Final         Passed - Patient is not pregnant      Passed - Valid encounter within last 12 months    Recent Outpatient Visits           1 month ago Annual physical exam   [facility], Amy J, NP   2 months ago Chronic obstructive pulmonary disease, unspecified COPD type [facility])   [facility] [place], Rankin Buzzard, NP   3 months ago Anxiety and depression   [facility] [place], NP   4 months ago Hospital discharge follow-up   [facility], Amy J, NP   9 months ago Acute conjunctivitis of right eye, unspecified acute conjunctivitis type   [facility], MD

## 2024-04-03 ENCOUNTER — Other Ambulatory Visit: Payer: Self-pay | Admitting: Family

## 2024-04-03 ENCOUNTER — Ambulatory Visit: Admitting: Internal Medicine

## 2024-04-03 ENCOUNTER — Other Ambulatory Visit

## 2024-04-03 VITALS — BP 92/51 | HR 68 | Ht 60.0 in | Wt 130.4 lb

## 2024-04-03 DIAGNOSIS — J84112 Idiopathic pulmonary fibrosis: Secondary | ICD-10-CM | POA: Diagnosis not present

## 2024-04-03 DIAGNOSIS — Z87891 Personal history of nicotine dependence: Secondary | ICD-10-CM

## 2024-04-03 DIAGNOSIS — G8918 Other acute postprocedural pain: Secondary | ICD-10-CM

## 2024-04-03 DIAGNOSIS — J849 Interstitial pulmonary disease, unspecified: Secondary | ICD-10-CM | POA: Diagnosis not present

## 2024-04-03 DIAGNOSIS — Z79899 Other long term (current) drug therapy: Secondary | ICD-10-CM | POA: Diagnosis not present

## 2024-04-03 DIAGNOSIS — J439 Emphysema, unspecified: Secondary | ICD-10-CM

## 2024-04-03 DIAGNOSIS — R110 Nausea: Secondary | ICD-10-CM

## 2024-04-03 DIAGNOSIS — Z5181 Encounter for therapeutic drug level monitoring: Secondary | ICD-10-CM

## 2024-04-03 LAB — PULMONARY FUNCTION TEST
DL/VA % pred: 61 %
DL/VA: 2.68 ml/min/mmHg/L
DLCO unc % pred: 39 %
DLCO unc: 7.09 ml/min/mmHg
FEF 25-75 Pre: 3.09 L/s
FEF2575-%Pred-Pre: 137 %
FEV1-%Pred-Pre: 71 %
FEV1-Pre: 1.63 L
FEV1FVC-%Pred-Pre: 113 %
FEV6-%Pred-Pre: 64 %
FEV6-Pre: 1.82 L
FEV6FVC-%Pred-Pre: 103 %
FVC-%Pred-Pre: 62 %
FVC-Pre: 1.82 L
Pre FEV1/FVC ratio: 89 %
Pre FEV6/FVC Ratio: 100 %
RV % pred: 46 %
RV: 0.79 L
TLC % pred: 63 %
TLC: 2.84 L

## 2024-04-03 MED ORDER — ONDANSETRON 4 MG PO TBDP
4.0000 mg | ORAL_TABLET | Freq: Three times a day (TID) | ORAL | 2 refills | Status: DC | PRN
Start: 2024-04-03 — End: 2024-08-11

## 2024-04-03 NOTE — Patient Instructions (Signed)
 Full pft without post spiro performed today

## 2024-04-03 NOTE — Patient Instructions (Addendum)
  ILD (interstitial lung disease) (HCC) IPF [idiopathic pulmonary fibrosis]  -According to biopsy report 10/09/2023 the results are consistent with IPF - toleraing low dose ofev  100mg  twice daily since March 2025 wekk   - baseline fatigue and nausea without change  PLAN -Check liver function test today -Continue nintedanib 100 mg twice daily low-dose protocol  - can conside titration up in future -Consider clinical trials as a care option and glad you are interested  -Meet Mei/Jordan  for enrollment in IPF-pro registry study -Will get your biopsy read at the [facility] clinic for a second opinion  - apologize for delay in this -Will get discussed on case conference August 2025  Postoperative chest pain following surgical lung biopsy  - improved  Plan  - supportive care   Emphysema  Plan   -conitnue Breztril daily  - continue albuterolas needed  Smoking - QUIT 09/06/23  -glad you quit 09/06/23  PLAN  - Stay in remission.   Follow-up - -16 weeks perimetry and DLCO - symptoms core and sit/stand test at followup

## 2024-04-03 NOTE — Telephone Encounter (Signed)
 Refilled 04/03/24 # 30 with 2 refills. Requested Prescriptions  Refused Prescriptions Disp Refills   ondansetron  (ZOFRAN -ODT) 4 MG disintegrating tablet [Pharmacy Med Name: ONDANSETRON  ODT 4 MG TABLET] 30 tablet 2    Sig: TAKE 1 TABLET BY MOUTH EVERY 8 HOURS AS NEEDED FOR NAUSEA AND VOMITING     Not Delegated - Gastroenterology: Antiemetics - ondansetron  Failed - 04/03/2024 12:47 PM      Failed - This refill cannot be delegated      Failed - AST in normal range and within 360 days    AST  Date Value Ref Range Status  02/01/2024 70 (H) 0 - 40 IU/L Final         Failed - ALT in normal range and within 360 days    ALT  Date Value Ref Range Status  02/01/2024 51 (H) 0 - 32 IU/L Final         Passed - Valid encounter within last 6 months    Recent Outpatient Visits           2 months ago Annual physical exam   [facility], Amy J, NP   2 months ago Chronic obstructive pulmonary disease, unspecified COPD type [facility])   [facility] [place], Rankin Buzzard, NP   3 months ago Anxiety and depression   [facility] [place], NP   4 months ago Hospital discharge follow-up   [facility], Amy J, NP   9 months ago Acute conjunctivitis of right eye, unspecified acute conjunctivitis type   [facility], MD

## 2024-04-03 NOTE — Progress Notes (Signed)
 OV 02/22/2021  Subjective:  Patient ID: [name], female , DOB: [birth year] , age 58 y.o. , MRN: [id number] , ADDRESS: [id number] [place] [address] PCP Kenneth Peace, DO Patient Care Team: Kenneth Peace, DO as PCP - General (Family Medicine) Patel, Donika K, DO as Consulting Physician (Neurology)  This Provider for this visit: Treatment Team:  Attending Provider: Maire Scot, MD    02/22/2021 -   Chief Complaint  Patient presents with   Consult    Hx of COPD, nodules on CT scan a year or 2 ago.  Pna on 4/2.     HPI [name] 58 y.o. -presents for pulm evaluation because of chronic cough and shortness of breath.  She tells me that she has cough and shortness of breath for few to several years.  Slowly it has been progressive.  In March 2022 she had COVID-19.  Treated as outpatient.  This did not change her baseline symptoms.  She feels symptoms are significant as documented below.  There is associated wheezing.  She is on Trelegy and this helps her.  Albuterol  also helps.  She says she has been given a diagnosis of COPD based on CT scan of the chest and symptoms.  She says she has had exacerbations treated by prednisone  3 times as outpatient.  This is also helped her symptoms.  She has over 25 pack smoking history.  Probably over 30 pack.  She quit for many years and then finally started smoking again 5 years ago because of stressors in life.  She finally quit again 2 weeks ago although she says it is very hard to maintain remission.  Even in the years when she quit smoking she was still exposed to the wood smoke.  Her mom continues to smoke and she is exposed to passive smoke.  Her mom has COPD so this family history of COPD.  Unclear if this alpha-1 antitrypsin phenotype.   Of note: She has poor dentition.  She is seen Dr. Alejandro Hurt in [place] [facility].  Given her chronic cough and wheezing and shortness of breath they wanted  pulmonary clearance.   Exposure hx  - She smoked from age 21 to age 37 x 1 pack/day.  Then she quit till age 52 and for the last 5 years has been smoking 1 pack a day again and then quit 2 weeks ago.  Total smoking history then becomes 30 pack/day  -She owns a large property according to history.  Unclear if it is rule out commercial or residential line.  Nevertheless she does burn a lot of wood on a regular basis constantly and gets exposed to the wood smoke.  Although does not stove is wood smoke exposure there is significant  -No roaches or mildew in the house.  No mold in the house.  No bird feather in the house.  No pet birds or parakeets.  No gerbils no hamsters.  No down pillow.  No feather jackets.  There are no roaches in the house.  CT Chest data dec 2020   IMPRESSION: Peribronchial thickening with persistent bibasilar ground-glass infiltrates which could be infectious or inflammatory.   Resolution of majority of mucous plugging since previous exam.   Atherosclerotic calcifications including coronary arteries.   Resolution of previously identified 8 mm LEFT upper lobe ground-glass nodule.   Aortic Atherosclerosis (ICD10-I70.0).     Electronically Signed   By: Philippe Brazen.D.  On: 10/15/2019 08:59   1 Patient Communication  Component 1 mo ago 3/722  SARS Coronavirus 2 RESULT: POSITIVE Abnormal         PFT  No flowsheet data found.       03/08/2021 Patient presents today for follow-up. During last visit she was ordered for labs including CBC with diff, IgE, RAST allergy panel and Alpha 1 phenotype. She was also ordered for HRCT. Recommended to continue Trelegy. Pre-op eval for Dr. Alejandro Hurt DDS at follow-up, may need cardiology eval/clearance if imaging shows any significant cardiac findings. CT Imaging showed significant worsening of chronic extensive patchy ground glass opacities throughout both lungs with associated mild patchy subpleural reticulation  and mild cylindrical bronchiectasis. Favoring desquamative interstitial pneumonia. Three vessel coronary atherosclerosis.   She feels her breathing has improve some. She still has a np congested cough, this is not new. She is unable to produce mucus. She is still smoking 10 cigarettes a day. She is interested in quitting and has in the past several years ago. Her primary care physican just put her on chantix . She has no PFTs on file. She is needing clearance for dental work, unclear type of anesthesia. She had no issues with recent colonscopy several weeks ago.    06/09/2021 - Interim hx  Patient presents today for 3 month follow-up with PFTs. Current smoker, she is still smoking 10 cigarettes a day. She is having a hard time quitting, she tells me that everyone in her life smokes around her. She was unable to tolerate chantix  d/t nausea/vomiting for 1 month. She reports improvement in breathing with Trelegy. Uses SABA on average once a day. Albuterol  helped her wheezing after she took it today for her breathing test. She still has a chronic np cough, intermittent wheezing and dyspnea on exertion. She was unable to take mucinex because she can not swallow large pills. HRCT imaging in May 2022 showed significant worsening of smoking related ILD changes, at her last visit we stressed importance of smoking cessation. Breathing test today showed moderate-severe restriction and severe diffusion defect. 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 FENO 23.     Pulmonary function testing 06/09/2021-FVC 1.55 (50%), FEV1 1.34 (55%), ratio 87, TLC 76%, DLCOunc 8.09 (43%)  Labs:  02/25/21 - Eos absolute 700; IgE 2. Rast allergy panel negative.  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 Significant worsening of chronic extensive patchy ground-glass opacities throughout both lungs, lower lobe predominant, with associated mild patchy subpleural reticulation and mild cylindrical bronchiectasis. 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 2. Three-vessel coronary atherosclerosis. 3. Diffuse bronchial wall thickening and mild centrilobular emphysema, compatible with the provided history of COPD. 4. Aortic Atherosclerosis (ICD10-I70.0) and Emphysema (ICD10-J43.9).   Exposure hx  - She smoked from age 32 to age 89 x 1 pack/day.  Then she quit till age 47 and for the last 5 years has been smoking 1 pack a day again and then quit 2 weeks ago.  Total smoking history then becomes 39 pack/day   -She owns a large property according to history.  Unclear if it is rule out commercial or residential line.  Nevertheless she does burn a lot of wood on a regular basis constantly and gets exposed to the wood smoke.  Although does not stove is wood smoke exposure there is significant   -No roaches or mildew in the house.  No mold in the house.  No bird feather  in the house.  No pet birds or parakeets.  No gerbils no hamsters.  No down pillow.  No feather jackets.  There are no roaches in the house.   OV 06/13/2022  Subjective:  Patient ID: [name], female , DOB: [birth year] , age 34 y.o. , MRN: [id number] , ADDRESS: [address] [place] [address] PCP Irwin Manual, DO Patient Care Team: Irwin Manual, DO as PCP - General (Family Medicine) Nahser, Lela Purple, MD as PCP - Cardiology (Cardiology) Patel, Donika K, DO as Consulting Physician (Neurology)  This Provider for this visit: Treatment Team:  Attending Provider: Maire Scot, MD    06/13/2022 -   Chief Complaint  Patient presents with   Follow-up    PFT performed today.  Pt states she has been having pain in sides and back when she coughs or breathes. States her breathing has been worse.   HPI YAZMEN BRIONES 58 y.o. -returns for follow-up.  Its been a year since I saw her.  At that time there is concern significantly for DIP which is interstitial lung disease on account  of her smoking.  So we also do pulmonary function test.  She did pulmonary function test today.  She has severe restrictio nd her FEV1 is significantly declined compared to a year ago..  I personally visualized PFT.  Unable to get this to flow into the epic at this point in time.  She tells me that overall she continues to have significant cough.  She also tries to bring her to phlegm and she can taste it to be of metallic taste but unable to expectorate.  She is on Trelegy but she ran out a few weeks ago because of insurance issues.  She is also complaining about pain in her scapular region from coughing a lot.  And for the last few days she has had increased cough, congestion, tightness and increased wheezing.  She feels she is in a COPD exacerbation at this point.  Her smoking has relapsed.  People around her house smoke.  She has extensive family members with COPD.  Her mother is going to see 1 of pulmonary physicians in the office in the next few weeks.  She is interested in research studies.   OV 04/25/2023  Subjective:  Patient ID: [name], female , DOB: [birth year] , age 31 y.o. , MRN: [id number] , ADDRESS: [address] [place] [address] PCP Senaida Dama, NP Patient Care Team: Senaida Dama, NP as PCP - General (Nurse Practitioner) Nahser, Lela Purple, MD as PCP - Cardiology (Cardiology) Patel, Donika K, DO as Consulting Physician (Neurology)  This Provider for this visit: Treatment Team:  Attending Provider: Maire Scot, MD    04/25/2023 -  No chief complaint on file.   HPI [name] 58 y.o. -   OV 07/03/2023  Subjective:  Patient ID: [name], female , DOB: August 26, 1966 , age 42 y.o. , MRN: [id number] , ADDRESS: [address] [place] [address] PCP Senaida Dama, NP Patient Care Team: Senaida Dama, NP as PCP - General (Nurse Practitioner) Nahser, Lela Purple, MD as PCP - Cardiology (Cardiology) Patel, Donika K, DO as Consulting Physician  (Neurology)  This Provider for this visit: Treatment Team:  Attending Provider: Maire Scot, MD 07/03/2023 -   Chief Complaint  Patient presents with   Follow-up    F/up      HPI [name] 58 y.o. -not seen in  over a year.  In October 2023 she had a CT scan and showed worsening ILD.  The ILD features are getting more prominent.  She missed her appointment in June 2024.  This is because a mother who had small cell cancer died.  She was the main caregiver.  She says she continues to smoke.  She is not able to afford Trelegy.  Therefore she is doing albuterol  as needed.  She is constantly coughing.  Cough is particularly worse at night but also in the daytime it is dry.  She says she is wheezing all the time.  She is also short of breath but the wheezing has gotten worse and the cough is gotten worse the shortness of breath around the same.  Of note for the last 1 days she is noticing submandibular cervical adenopathy.  Has not seen anybody for this.  There is no sore throat or fever or chills.    CT Chest data from date: *HRCT 08/17/22  - personally visualized and independently interpreted : yes - my findings are: as below  IMPRESSION: 1. The appearance of the lungs is indicative of interstitial lung disease, with some areas of progression compared to the prior study and other areas which appear improved. Overall, the spectrum of findings on today's examination is technically categorized as probable usual interstitial pneumonia (UIP) per current ATS guidelines. Repeat high-resolution chest CT is suggested in 12 months to assess for temporal changes in the appearance of the lung parenchyma. 2. Mild centrilobular and paraseptal emphysema also noted, compatible with reported clinical history of COPD. 3. Aortic atherosclerosis, in addition to left main and three-vessel coronary artery disease. Please note that although the presence of coronary artery calcium  documents the presence  of coronary artery disease, the severity of this disease and any potential stenosis cannot be assessed on this non-gated CT examination. Assessment for potential risk factor modification, dietary therapy or pharmacologic therapy may be warranted, if clinically indicated.   Aortic Atherosclerosis (ICD10-I70.0) and Emphysema (ICD10-J43.9).     Electronically Signed   By: Alexandria Angel M.D.   On: 08/18/2022 09:43    OV 09/06/2023  Subjective:  Patient ID: [name], female , DOB: [birth year] , age 59 y.o. , MRN: [id number] , ADDRESS: [address] [place] [address] PCP Senaida Dama, NP Patient Care Team: Senaida Dama, NP as PCP - General (Nurse Practitioner) Nahser, Lela Purple, MD as PCP - Cardiology (Cardiology) Patel, Donika K, DO as Consulting Physician (Neurology)  This Provider for this visit: Treatment Team:  Attending Provider: Maire Scot, MD    09/06/2023 -   Chief Complaint  Patient presents with   Follow-up    Pt would like to f/u on ct, labs and pft results. Has concerns with severe cough     HPI [name] 58 y.o. -presents for follow-up after completing some of her ILD workup.   In terms of his symptoms: These remain the same.  See below.  No new medical problems no ER visits no surgeries.  In terms of her ILD workup  -She continues to smoke  - This underwent EGD by Dr. 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  She is on antinausea medications according to chart review.  According to history the EGD was unremarkable.  I confirmed this upon record review of the her endoscopy report 09/04/2023  -  she had autoimmune serology this is noorma  -She had  a high-resolution CT chest: I agree with the radiologist there is emphysema and also air trapping.  I disagree with the radiologist after my own personal visualization that this is probable UIP.  I feel it is  indeterminate/alternative diagnosis.  What started as a groundglass opacities 4 years ago has progressed into full-blown ILD.  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  She is a smoker this could be smokers ILD as well.   CT Chest data from date: *  - personally visualized and independently interpreted : yes - my findings are: INDETERMINATE FOR UIP v ALTERNATIVE Dx  Narrative & Impression  CLINICAL DATA:  58 year old female current smoker with history of interstitial lung disease. Follow-up study.   EXAM: CT CHEST WITHOUT CONTRAST   TECHNIQUE: Multidetector CT imaging of the chest was performed following the standard protocol without intravenous contrast. High resolution imaging of the lungs, as well as inspiratory and expiratory imaging, was performed.   RADIATION DOSE REDUCTION: This exam was performed according to the departmental dose-optimization program which includes automated exposure control, adjustment of the mA and/or kV according to patient size and/or use of iterative reconstruction technique.   COMPARISON:  High-resolution chest CT 08/17/2022.   FINDINGS: Cardiovascular: Heart size is normal. There is no significant pericardial fluid, thickening or pericardial calcification. There is aortic atherosclerosis, as well as atherosclerosis of the great vessels of the mediastinum and the coronary arteries, including calcified atherosclerotic plaque in the left main, left anterior descending, left circumflex and right coronary arteries.   Mediastinum/Nodes: No pathologically enlarged mediastinal or hilar lymph nodes. Please note that accurate exclusion of hilar adenopathy is limited on noncontrast CT scans. Esophagus is unremarkable in appearance. No axillary lymphadenopathy.   Lungs/Pleura: High-resolution images again demonstrate widespread areas of ground-glass attenuation, septal thickening, subpleural reticulation, cylindrical bronchiectasis,  peripheral bronchiolectasis and thickening of the peribronchovascular interstitium. These findings are most evident throughout the mid to lower lungs bilaterally and appear clearly progressive compared to the prior study. No definitive honeycombing confidently identified. Inspiratory and expiratory imaging is remarkable for mild air trapping indicative of mild small airways disease. No pleural effusions. No definite suspicious appearing pulmonary nodules or masses are noted. Mild centrilobular and paraseptal emphysema.   Upper Abdomen: Aortic atherosclerosis.   Musculoskeletal: Bilateral breast implants are incidentally noted. Chronic appearing compression fracture of inferior endplate of T10 with proximally 20% loss of anterior vertebral body height, unchanged. There are no aggressive appearing lytic or blastic lesions noted in the visualized portions of the skeleton.   IMPRESSION: 1. Progressive interstitial lung disease once again categorized as probable usual interstitial pneumonia (UIP) per current ATS guidelines. 2. Mild air trapping indicative of mild small airways disease. 3. Mild centrilobular and paraseptal emphysema also noted. 4. Aortic atherosclerosis, in addition to left main and three-vessel coronary artery disease. Please note that although the presence of coronary artery calcium  documents the presence of coronary artery disease, the severity of this disease and any potential stenosis cannot be assessed on this non-gated CT examination. Assessment for potential risk factor modification, dietary therapy or pharmacologic therapy may be warranted, if clinically indicated.   Aortic Atherosclerosis (ICD10-I70.0) and Emphysema (ICD10-J43.9).     Electronically Signed   By: Alexandria Angel M.D.   On: 08/05/2023 12:08     OV 01/01/2024  Subjective:  Patient ID: [name], female , DOB: 1966/09/23 , age 54 y.o. , MRN: [id number] , ADDRESS: [address] [place] [address] PCP Senaida Dama, NP Patient  Care Team: Senaida Dama, NP as PCP - General (Nurse Practitioner) Nahser, Lela Purple, MD as PCP - Cardiology (Cardiology) Patel, Donika K, DO as Consulting Physician (Neurology)  This Provider for this visit: Treatment Team:  Attending Provider: Maire Scot, MD    01/01/2024 -   Chief Complaint  Patient presents with   Follow-up    Having some right side pain after lung biopsy. She is getting over the flu- just finished tamiflu  on 12/27/23.        HPI [name] 58 y.o. -returns for follow-up after surgical lung biopsy.  She tells me that after the last visit in November 2024 she quit smoking.  Then in October 09, 2023 she underwent surgical lung biopsy by Dr. Deloise Ferries.  The results are showing UIP but there is also inflammation.  There is microscopic honeycombing.  This needs to be discussed in the case conference.  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  From a breathing standpoint she feels she is stable but her main complaint is postoperative pain.  She says she is very tender at the incisional area which is actually healed quite well.  It gets worse when she lies down or puts pressure on it or touches it.  Today's the first day she wore a bra.  Because it is extremely sensitive and tender.  She got some opioid prescriptions from Dr. Deloise Ferries but these are run out.  She wants me to fill that.  I have agreed to a temporary refill.  We discussed the possibility this is UIP/IPF and discussed antifibrotic therapy.  We discussed both pirfenidone and nintedanib.  She prefers nintedanib because of the easy schedule she understands diarrhea is a side effect but also requires drug-induced liver injury monitoring and understands the rare side  effects   We also discussed clinical trials as a care option and she is interested.  We gave her IPF-pro registry consent.   0   OV 04/03/2024  Subjective:  Patient ID: [name], female , DOB: [birth year] , age 49 y.o. , MRN: [id number] , ADDRESS: [address] 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 Ahmad Alert May 2023   #ILD   - GGO mild 2020  -> slightly more proinet 2022 -> ILD/?NSIP intdeterminate pattern 2023 and 2024 WORSE  -  Surgical lung biopsy by Dr. 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 Annita Kindle HPI [name] 58 y.o. -presents for follow-up.  This is to see uptake with nintedanib for diagnose of IPF.  She is now on nintedanib 100 mg twice daily.  She started on the low-dose protocol because of baseline nausea that she has had because of eating disorder and baseline fatigue.  These are not any worse.  She is not having much of her diarrhea at all.  She feels overall stable  in terms of shortness of breath and also GI symptoms.  Her exercise hypoxemia test is also stable.  Of note we were supposed to discuss in the case conference yesterday but we could not.  We also wanted to get her slides over to the [facility] clinic which we still have not accomplished.  I did share these findings with her.  I have asked navigator release to get this coordinated.  I did communicate with the ILD navigator.  Her main concern is that she has gained weight and she feels because of nintedanib.  However her BMI is normal at 25.5.  I did share this with her.  I did tell her that most patients lose weight and within normal BMI is very reassuring and I did indicate to her that we will be monitoring this.  Of note she was supposed to join the IPF-pro registry.  She got a copy of the consent but she did not schedule.  She said her car broke and she could not make the appointment.  She is meeting the research coordinators again to fix a new appointment time.  She is interested in participating.  Of note her postoperative pain is almost resolved.  Smoking is still in remission.         SYMPTOM SCALE - Pulm 02/22/2021   06/09/2021  09/06/2023  01/01/2024 Postbiopsy 10/08/2024 04/03/2024 Ofev  100mg  bid  130#  O2 use ra RA ra    Shortness of Breath 0 -> 5 scale with 5 being worst (score 6 If unable to do)      At rest 2 2 2  0/5 1  Simple tasks - showers, clothes change, eating, shaving 2 3 2.5 2 2.5  Household (dishes, doing bed, laundry) 3.5 4 2.5 2.5 3.5  Shopping 4 4 3  2.5 3.5  Walking level at own pace 2 3 3 2 2   Walking up Stairs 3 4 4  3.5 4  Total (30-36) Dyspnea Score 18.5 20 19 13  16.5  How bad is your cough? 4 3 Bad ribs hurts Not right now 0  How bad is your fatigue 5 5 All the time All te time 5 - baseline pre ofev   How bad is nausea 4 4 sometimes 5 5 - baseline esating disore  How bad is vomiting?   2 2 no 2 1  How bad is diarrhea? 0 0 no 2 0  How bad is anxiety? 5 3 Not  bad 4 2  How bad is depression 5 3 Not bad 4 2     PFT     Latest Ref Rng & Units 04/03/2024    1:10 PM 08/14/2023   10:59 AM 06/13/2022    9:05 AM 06/09/2021    9:05 AM  PFT Results  FVC-Pre L 1.82  P 1.34  1.19  1.61   FVC-Predicted Pre % 62  P 45  38  52   FVC-Post L  1.45   1.55  FVC-Predicted Post %  49   50   Pre FEV1/FVC % % 89  P 83  83  81   Post FEV1/FCV % %  82   87   FEV1-Pre L 1.63  P 1.12  0.98  1.30   FEV1-Predicted Pre % 71  P 48  40  53   FEV1-Post L  1.20   1.34   DLCO uncorrected ml/min/mmHg 7.09  P 7.49  7.87  8.09   DLCO UNC% % 39  P 41  42  43   DLCO corrected ml/min/mmHg   7.87  8.09   DLCO COR %Predicted %   42  43   DLVA Predicted % 61  P 72  72  83   TLC L 2.84  P 2.92   3.52   TLC % Predicted % 63  P 65   76   RV % Predicted % 46  P 84   110     P Preliminary result       LAB RESULTS last 96 hours No results found.       has a past medical history of Allergy, Anemia, Anxiety, Arthritis, Cancer (HCC), CAP (community acquired pneumonia) (01/07/2020), Chronic kidney disease, Cigarette smoker (11/05/2018), COPD (chronic obstructive pulmonary disease) (HCC), Depression, Heart murmur, High cholesterol, History of endometriosis, Incontinence, Low back pain radiating to both legs (07/25/2020), Menopausal symptoms, Menopause (01/09/2012), Migraine, Myocardial infarction [facility]), Night sweats (10/05/2019), Osteoporosis, Paresthesia of foot, bilateral (03/28/2019), Productive cough (12/25/2019), Renal stone (06/01/2020), Skin cancer, Thyroid  disease, and Tick bite (02/10/2019).   reports that she has quit smoking. Her smoking use included cigarettes. She has a 34 pack-year smoking history. She has been exposed to tobacco smoke. She has never used smokeless tobacco.  Past Surgical History:  Procedure Laterality Date   ANKLE SURGERY     RIGHT   APPENDECTOMY     AUGMENTATION MAMMAPLASTY     BREAST ENHANCEMENT SURGERY     SALINE   BUNIONECTOMY      BILATERAL FEET   INTERCOSTAL NERVE BLOCK Right 10/09/2023   Procedure: INTERCOSTAL NERVE BLOCK;  Surgeon: Hilarie Lovely, MD;  Location: [facility];  Service: Thoracic;  Laterality: Right;   LEFT HEART CATH AND CORONARY ANGIOGRAPHY N/A 04/13/2021   Procedure: LEFT HEART CATH AND CORONARY ANGIOGRAPHY;  Surgeon: Arleen Lacer, MD;  Location: [facility] INVASIVE CV LAB;  Service: Cardiovascular;  Laterality: N/A;   TOOTH EXTRACTION N/A 07/11/2021   Procedure: DENTAL RESTORATION / EXTRACTIONS x 13;  Surgeon: Ascencion Lava, DMD;  Location: [facility];  Service: Oral Surgery;  Laterality: N/A;   TOTAL ABDOMINAL HYSTERECTOMY W/ BILATERAL SALPINGOOPHORECTOMY     TVT  10/30/2005   WISDOM TOOTH EXTRACTION     X 4    Allergies  Allergen Reactions   Dextromethorphan Other (See Comments)    keeps awake, legs constantly moving    Doxylamine Other (See Comments)    keeps awake, legs constantly moving    Pseudoeph-Doxylamine-Dm-Apap Other (See Comments)    REACTION: keeps awake, legs constantly moving  NYQUIL   Pseudoephedrine Hcl Other (See Comments)    keeps awake, legs constantly moving     Immunization History  Administered Date(s) Administered   Hepatitis B, PED/ADOLESCENT 07/09/2023   Influenza,inj,Quad PF,6+ Mos 12/10/2020   Influenza-Unspecified 07/09/2023   Pneumococcal-Unspecified 02/10/2021   Td 05/30/2002   Tdap 11/12/2011, 02/16/2021   Zoster Recombinant(Shingrix) 12/12/2020, 02/16/2021    Family History  Problem Relation Age of Onset  Diabetes Mother    Heart disease Mother    Hyperlipidemia Mother    Colon polyps Mother    Tuberculosis Father    Alcohol abuse Father    Heart disease Sister    Hyperlipidemia Sister    Heart disease Brother    Hyperlipidemia Brother    Diabetes Maternal Grandmother    Diabetes Maternal Grandfather    Colon cancer Maternal Uncle    Stomach cancer Maternal Uncle      Current Outpatient Medications:    albuterol   (PROVENTIL ) (2.5 MG/3ML) 0.083% nebulizer solution, INHALE 3 ML BY NEBULIZATION EVERY 6 HOURS AS NEEDED FOR WHEEZING OR SHORTNESS OF BREATH, Disp: 75 mL, Rfl: 5   aspirin  81 MG chewable tablet, Chew 81 mg by mouth daily., Disp: , Rfl:    budeson-glycopyrrolate -formoterol  (BREZTRI  AEROSPHERE) 160-9-4.8 MCG/ACT AERO inhaler, Inhale 2 puffs into the lungs in the morning and at bedtime., Disp: 10.7 g, Rfl: 11   busPIRone  (BUSPAR ) 30 MG tablet, Take 1 tablet (30 mg total) by mouth 2 (two) times daily., Disp: 180 tablet, Rfl: 0   hydrOXYzine  (VISTARIL ) 25 MG capsule, Take 1 capsule (25 mg total) by mouth every 8 (eight) hours as needed., Disp: 30 capsule, Rfl: 1   linaclotide  (LINZESS ) 145 MCG CAPS capsule, Take 1 capsule (145 mcg total) by mouth daily as needed (Constipation)., Disp: , Rfl:    naloxone (NARCAN) nasal spray 4 mg/0.1 mL, SMARTSIG:Both Nares, Disp: , Rfl:    nicotine  (NICODERM CQ ) 14 mg/24hr patch, Place 1 patch (14 mg total) onto the skin daily., Disp: 30 patch, Rfl: 2   Nintedanib (OFEV ) 100 MG CAPS, Take 1 capsule (100 mg total) by mouth 2 (two) times daily., Disp: 180 capsule, Rfl: 1   nitroGLYCERIN  (NITROSTAT ) 0.4 MG SL tablet, Place 1 tablet (0.4 mg total) under the tongue every 5 (five) minutes as needed for chest pain., Disp: 25 tablet, Rfl: 1   omeprazole  (PRILOSEC) 20 MG capsule, TAKE 1 CAPSULE BY MOUTH EVERY DAY, Disp: 90 capsule, Rfl: 1   ondansetron  (ZOFRAN -ODT) 4 MG disintegrating tablet, Take 1 tablet (4 mg total) by mouth every 8 (eight) hours as needed for nausea or vomiting., Disp: 30 tablet, Rfl: 2   oxyCODONE -acetaminophen  (PERCOCET/ROXICET) 5-325 MG tablet, Take 1 tablet by mouth every 6 (six) hours as needed., Disp: , Rfl:    pregabalin (LYRICA) 75 MG capsule, Take 75 mg by mouth 2 (two) times daily., Disp: , Rfl:    promethazine -dextromethorphan (PROMETHAZINE -DM) 6.25-15 MG/5ML syrup, Take 5 mLs by mouth 4 (four) times daily as needed., Disp: 118 mL, Rfl: 0   ranolazine   (RANEXA ) 500 MG 12 hr tablet, Take 1 tablet (500 mg total) by mouth 2 (two) times daily., Disp: 180 tablet, Rfl: 3   rosuvastatin  (CRESTOR ) 40 MG tablet, TAKE 1 TABLET BY MOUTH EVERY DAY, Disp: 90 tablet, Rfl: 3   SUMAtriptan  (IMITREX ) 6 MG/0.5ML SOLN injection, Inject 0.5 mLs (6 mg total) into the skin every 2 (two) hours as needed for migraine or headache. May repeat in 2 hours if headache persists or recurs.  Limit to twice per week., Disp: 0.5 mL, Rfl: 8   traZODone  (DESYREL ) 100 MG tablet, Take 1 tablet (100 mg total) by mouth at bedtime., Disp: 90 tablet, Rfl: 0   VENTOLIN  HFA 108 (90 Base) MCG/ACT inhaler, INHALE 1-2 PUFFS BY MOUTH EVERY 6 HOURS AS NEEDED FOR WHEEZE OR SHORTNESS OF BREATH, Disp: 18 each, Rfl: 2   ezetimibe  (ZETIA ) 10 MG tablet, Take 1 tablet (10  mg total) by mouth daily., Disp: 90 tablet, Rfl: 3      Objective:   Vitals:   04/03/24 1448  BP: (!) 92/51  Pulse: 68  SpO2: 94%  Weight: 130 lb 6.4 oz (59.1 kg)  Height: 5' (1.524 m)    Estimated body mass index is 25.47 kg/m as calculated from the following:   Height as of this encounter: 5' (1.524 m).   Weight as of this encounter: 130 lb 6.4 oz (59.1 kg).  @WEIGHTCHANGE @  [employer]   04/03/24 1448  Weight: 130 lb 6.4 oz (59.1 kg)     Physical Exam   General: No distress. Looks well O2 at rest: no Cane present: no Sitting in wheel chair: no Frail: no Obese: no Neuro: Alert and Oriented x 3. GCS 15. Speech normal Psych: Pleasant Resp:  Barrel Chest - no.  Wheeze - no, Crackles - some in UL but not L, No overt respiratory distress CVS: Normal heart sounds. Murmurs - no Ext: Stigmata of Connective Tissue Disease - no HEENT: Normal upper airway. PEERL +. No post nasal drip        Assessment:       ICD-10-CM   1. IPF (idiopathic pulmonary fibrosis) (HCC)  J84.112 Hepatic function panel    Pulmonary function test    2. Pulmonary emphysema, unspecified emphysema type (HCC)  J43.9 Hepatic  function panel    Pulmonary function test    3. Post-operative pain  G89.18 Hepatic function panel    Pulmonary function test    4. Quit smoking within past year  Z87.891 Hepatic function panel    Pulmonary function test    5. Encounter for therapeutic drug monitoring  Z51.81 Hepatic function panel    Pulmonary function test         Plan:     Patient Instructions   ILD (interstitial lung disease) (HCC) IPF [idiopathic pulmonary fibrosis]  -According to biopsy report 10/09/2023 the results are consistent with IPF - toleraing low dose ofev  100mg  twice daily since March 2025 wekk   - baseline fatigue and nausea without change  PLAN -Check liver function test today -Continue nintedanib 100 mg twice daily low-dose protocol  - can conside titration up in future -Consider clinical trials as a care option and glad you are interested  -Meet Mei/Jordan  for enrollment in IPF-pro registry study -Will get your biopsy read at the [facility] for a second opinion  - apologize for delay in this -Will get discussed on case conference August 2025  Postoperative chest pain following surgical lung biopsy  - improved  Plan  - supportive care   Emphysema  Plan   -conitnue Breztril daily  - continue albuterolas needed  Smoking - QUIT 09/06/23  -glad you quit 09/06/23  PLAN  - Stay in remission.   Follow-up - -16 weeks perimetry and DLCO - symptoms core and sit/stand test at followup   FOLLOWUP Return in about 16 weeks (around 07/24/2024) for 30 min visit, after Spiro and DLCO, with Dr Bertrum Brodie, Face to Face Visit.    SIGNATURE    Dr. Maire Scot, M.D., F.C.C.P,  Pulmonary and Critical Care Medicine Staff Physician, [facility] Center Director - Interstitial Lung Disease  Program  Pulmonary Fibrosis [facility] [place], [place], [address]  Pager: [phone], If no answer or between  15:00h - 7:00h: call [phone] Telephone: [phone]  3:34 PM 04/03/2024

## 2024-04-03 NOTE — Progress Notes (Signed)
 Full pft without post spiro performed today

## 2024-04-03 NOTE — Telephone Encounter (Signed)
 Ondansetron  prescribed 04/03/2024.

## 2024-04-04 LAB — HEPATIC FUNCTION PANEL
ALT: 35 U/L (ref 0–35)
AST: 41 U/L — ABNORMAL HIGH (ref 0–37)
Albumin: 4.6 g/dL (ref 3.5–5.2)
Alkaline Phosphatase: 55 U/L (ref 39–117)
Bilirubin, Direct: 0.1 mg/dL (ref 0.0–0.3)
Total Bilirubin: 0.6 mg/dL (ref 0.2–1.2)
Total Protein: 7.1 g/dL (ref 6.0–8.3)

## 2024-04-05 DIAGNOSIS — J449 Chronic obstructive pulmonary disease, unspecified: Secondary | ICD-10-CM | POA: Diagnosis not present

## 2024-04-05 DIAGNOSIS — R32 Unspecified urinary incontinence: Secondary | ICD-10-CM | POA: Diagnosis not present

## 2024-04-08 ENCOUNTER — Telehealth: Payer: Self-pay | Admitting: Family

## 2024-04-08 NOTE — Telephone Encounter (Signed)
 A document form from [facility] has been faxed: Incontinence order paperwork, to be filled out by provider. Send document back via Fax within 7-days. Document is located in providers tray at front office.           Fax number: [phone]

## 2024-04-15 NOTE — Telephone Encounter (Signed)
 Paperwork was fax back on 04/10/2024

## 2024-04-25 DIAGNOSIS — M5412 Radiculopathy, cervical region: Secondary | ICD-10-CM | POA: Diagnosis not present

## 2024-04-25 DIAGNOSIS — S22070S Wedge compression fracture of T9-T10 vertebra, sequela: Secondary | ICD-10-CM | POA: Diagnosis not present

## 2024-04-25 DIAGNOSIS — J849 Interstitial pulmonary disease, unspecified: Secondary | ICD-10-CM | POA: Diagnosis not present

## 2024-04-25 DIAGNOSIS — Z79899 Other long term (current) drug therapy: Secondary | ICD-10-CM | POA: Diagnosis not present

## 2024-04-28 ENCOUNTER — Encounter (HOSPITAL_COMMUNITY): Payer: Self-pay

## 2024-04-28 ENCOUNTER — Other Ambulatory Visit: Payer: Self-pay | Admitting: Family

## 2024-04-28 DIAGNOSIS — G4700 Insomnia, unspecified: Secondary | ICD-10-CM

## 2024-04-28 NOTE — Telephone Encounter (Signed)
 Complete

## 2024-05-05 ENCOUNTER — Ambulatory Visit: Payer: Self-pay | Admitting: Internal Medicine

## 2024-05-05 NOTE — Telephone Encounter (Signed)
 LFT ok but wanting to go up  Plan  -= check LFT again next week or so

## 2024-05-06 ENCOUNTER — Other Ambulatory Visit: Payer: Self-pay | Admitting: Internal Medicine

## 2024-05-06 DIAGNOSIS — J449 Chronic obstructive pulmonary disease, unspecified: Secondary | ICD-10-CM | POA: Diagnosis not present

## 2024-05-06 DIAGNOSIS — Z5181 Encounter for therapeutic drug level monitoring: Secondary | ICD-10-CM

## 2024-05-06 DIAGNOSIS — R32 Unspecified urinary incontinence: Secondary | ICD-10-CM | POA: Diagnosis not present

## 2024-05-06 NOTE — Progress Notes (Signed)
 Spoke with the pt and notified of results/recs per MR. She verbalized understanding. Lab order was done and appt scheduled for this.

## 2024-05-09 ENCOUNTER — Telehealth: Payer: Self-pay | Admitting: Family

## 2024-05-09 NOTE — Telephone Encounter (Signed)
 A document form from [facility] has been faxed: Incontinence order, to be filled out by provider. Send document back via Fax within 7-days to 10 business days. Document is located in providers tray at front office.          Fax number: [phone]

## 2024-05-09 NOTE — Telephone Encounter (Signed)
 Patient needs a appointment to get documentation about her incontinence.[name] desk has call her to schedule the appointment

## 2024-05-15 NOTE — Progress Notes (Deleted)
 Natalie Console, PA-C [address] [place], [place]  [address] Phone: [phone]   Primary Care Physician: Lorren Greig PARAS, NP  Primary Gastroenterologist:  Natalie Console, PA-C / Norleen Kiang, MD   Chief Complaint: Elevated LFTs, follow-up iron deficiency anemia       HPI:   [name] is a 58 y.o. female is referred by her PCP to evaluate elevated liver transaminases, predominantly AST.  Review of labs show mild elevated AST 52 starting December 2024.  Before that all LFTs normal.  01/2024 labs mild elevated AST 70 > ALT 51.  Labs 03/2024 showed improved AST 41, ALT 35.  She has not had liver ultrasound or imaging.  Alcohol?  Family history liver disease:  Takes multiple medications including oxycodone -acetaminophen  for chronic pain.  Is on Crestor  (rosuvastatin ) 40 Mg daily and Zetia  10 Mg daily.  Current symptoms: Takes Prilosec 20 once daily for GERD and Zofran  for nausea.  Linzess  145 for constipation.  PMH: COPD, interstitial lung disease, idiopathic pulmonary fibrosis, mitral valve prolapse, hyperlipidemia, tobacco dependence, history of eating disorder, IDA, chronic pain, GERD, chronic constipation, chronic nausea.  08/2023 EGD by Dr. Kiang (for IDA, GERD, nausea): Normal.  Biopsies negative for celiac.  01/2021 screening colonoscopy: One small 2 mm sessile serrated polyp removed.  Diverticulosis.  Excellent prep.  7-year repeat (12/2027).  Current Outpatient Medications  Medication Sig Dispense Refill   albuterol  (PROVENTIL ) (2.5 MG/3ML) 0.083% nebulizer solution INHALE 3 ML BY NEBULIZATION EVERY 6 HOURS AS NEEDED FOR WHEEZING OR SHORTNESS OF BREATH 75 mL 5   aspirin  81 MG chewable tablet Chew 81 mg by mouth daily.     budeson-glycopyrrolate -formoterol  (BREZTRI  AEROSPHERE) 160-9-4.8 MCG/ACT AERO inhaler Inhale 2 puffs into the lungs in the morning and at bedtime. 10.7 g 11   ezetimibe  (ZETIA ) 10 MG tablet Take 1 tablet (10 mg total) by mouth daily. 90 tablet 3    hydrOXYzine  (VISTARIL ) 25 MG capsule Take 1 capsule (25 mg total) by mouth every 8 (eight) hours as needed. 30 capsule 1   linaclotide  (LINZESS ) 145 MCG CAPS capsule Take 1 capsule (145 mcg total) by mouth daily as needed (Constipation).     naloxone (NARCAN) nasal spray 4 mg/0.1 mL SMARTSIG:Both Nares     nicotine  (NICODERM CQ ) 14 mg/24hr patch Place 1 patch (14 mg total) onto the skin daily. 30 patch 2   Nintedanib (OFEV ) 100 MG CAPS Take 1 capsule (100 mg total) by mouth 2 (two) times daily. 180 capsule 1   nitroGLYCERIN  (NITROSTAT ) 0.4 MG SL tablet Place 1 tablet (0.4 mg total) under the tongue every 5 (five) minutes as needed for chest pain. 25 tablet 1   omeprazole  (PRILOSEC) 20 MG capsule TAKE 1 CAPSULE BY MOUTH EVERY DAY 90 capsule 1   ondansetron  (ZOFRAN -ODT) 4 MG disintegrating tablet Take 1 tablet (4 mg total) by mouth every 8 (eight) hours as needed for nausea or vomiting. 30 tablet 2   oxyCODONE -acetaminophen  (PERCOCET/ROXICET) 5-325 MG tablet Take 1 tablet by mouth every 6 (six) hours as needed.     pregabalin (LYRICA) 75 MG capsule Take 75 mg by mouth 2 (two) times daily.     promethazine -dextromethorphan (PROMETHAZINE -DM) 6.25-15 MG/5ML syrup Take 5 mLs by mouth 4 (four) times daily as needed. 118 mL 0   ranolazine  (RANEXA ) 500 MG 12 hr tablet Take 1 tablet (500 mg total) by mouth 2 (two) times daily. 180 tablet 3   rosuvastatin  (CRESTOR ) 40 MG tablet TAKE 1  TABLET BY MOUTH EVERY DAY 90 tablet 3   SUMAtriptan  (IMITREX ) 6 MG/0.5ML SOLN injection Inject 0.5 mLs (6 mg total) into the skin every 2 (two) hours as needed for migraine or headache. May repeat in 2 hours if headache persists or recurs.  Limit to twice per week. 0.5 mL 8   traZODone  (DESYREL ) 100 MG tablet TAKE 1 TABLET BY MOUTH EVERYDAY AT BEDTIME 90 tablet 0   VENTOLIN  HFA 108 (90 Base) MCG/ACT inhaler INHALE 1-2 PUFFS BY MOUTH EVERY 6 HOURS AS NEEDED FOR WHEEZE OR SHORTNESS OF BREATH 18 each 2   No current  facility-administered medications for this visit.    Allergies as of 05/16/2024 - Review Complete 04/03/2024  Allergen Reaction Noted   Dextromethorphan Other (See Comments) 01/13/2021   Doxylamine Other (See Comments) 01/13/2021   Pseudoeph-doxylamine-dm-apap Other (See Comments)    Pseudoephedrine hcl Other (See Comments) 01/13/2021    Past Medical History:  Diagnosis Date   Allergy    Anemia    during pregnancies   Anxiety    Arthritis    hands and hip   Cancer (HCC)    CAP (community acquired pneumonia) 01/07/2020   Chronic kidney disease    KIDNEYSTONES   Cigarette smoker 11/05/2018   COPD (chronic obstructive pulmonary disease) (HCC)    Depression    Heart murmur    MITRAL VALVE PROLASP   High cholesterol    History of endometriosis    Incontinence    Low back pain radiating to both legs 07/25/2020   Menopausal symptoms    Menopause 01/09/2012   Migraine    Myocardial infarction (HCC)    silent 3 years ago   Night sweats 10/05/2019   Osteoporosis    Paresthesia of foot, bilateral 03/28/2019   Productive cough 12/25/2019   Renal stone 06/01/2020   Skin cancer    Thyroid  disease    Tick bite 02/10/2019    Past Surgical History:  Procedure Laterality Date   ANKLE SURGERY     RIGHT   APPENDECTOMY     AUGMENTATION MAMMAPLASTY     BREAST ENHANCEMENT SURGERY     SALINE   BUNIONECTOMY     BILATERAL FEET   INTERCOSTAL NERVE BLOCK Right 10/09/2023   Procedure: INTERCOSTAL NERVE BLOCK;  Surgeon: Shyrl Linnie KIDD, MD;  Location: [facility] OR;  Service: Thoracic;  Laterality: Right;   LEFT HEART CATH AND CORONARY ANGIOGRAPHY N/A 04/13/2021   Procedure: LEFT HEART CATH AND CORONARY ANGIOGRAPHY;  Surgeon: Anner Alm ORN, MD;  Location: [facility] INVASIVE CV LAB;  Service: Cardiovascular;  Laterality: N/A;   TOOTH EXTRACTION N/A 07/11/2021   Procedure: DENTAL RESTORATION / EXTRACTIONS x 13;  Surgeon: Sheryle Hamilton, DMD;  Location: [facility];  Service: Oral  Surgery;  Laterality: N/A;   TOTAL ABDOMINAL HYSTERECTOMY W/ BILATERAL SALPINGOOPHORECTOMY     TVT  10/30/2005   WISDOM TOOTH EXTRACTION     X 4    Review of Systems:    All systems reviewed and negative except where noted in HPI.    Physical Exam:  There were no vitals taken for this visit. No LMP recorded. Patient has had a hysterectomy.  General: Well-nourished, well-developed in no acute distress.  Lungs: Clear to auscultation bilaterally. Non-labored. Heart: Regular rate and rhythm, no murmurs rubs or gallops.  Abdomen: Bowel sounds are normal; Abdomen is Soft; No hepatosplenomegaly, masses or hernias;  No Abdominal Tenderness; No guarding or rebound tenderness. Neuro: Alert and oriented x 3.  Grossly  intact.  Psych: Alert and cooperative, normal mood and affect.   Imaging Studies: No results found.  Labs: CBC    Component Value Date/Time   WBC 5.9 02/01/2024 0956   WBC 8.9 10/11/2023 0216   RBC 4.73 02/01/2024 0956   RBC 3.60 (L) 10/11/2023 0216   HGB 13.8 02/01/2024 0956   HCT 42.0 02/01/2024 0956   PLT 204 02/01/2024 0956   MCV 89 02/01/2024 0956   MCH 29.2 02/01/2024 0956   MCH 29.2 10/11/2023 0216   MCHC 32.9 02/01/2024 0956   MCHC 32.8 10/11/2023 0216   RDW 14.4 02/01/2024 0956   LYMPHSABS 1.9 07/11/2023 0931   LYMPHSABS 2.2 12/10/2020 1114   MONOABS 1.3 (H) 07/11/2023 0931   EOSABS 0.2 07/11/2023 0931   EOSABS 0.2 12/10/2020 1114   BASOSABS 0.1 07/11/2023 0931   BASOSABS 0.1 12/10/2020 1114    CMP     Component Value Date/Time   NA 141 02/01/2024 0956   K 4.1 02/01/2024 0956   CL 100 02/01/2024 0956   CO2 26 02/01/2024 0956   GLUCOSE 93 02/01/2024 0956   GLUCOSE 98 10/11/2023 0216   BUN 11 02/01/2024 0956   CREATININE 0.88 02/01/2024 0956   CREATININE 0.66 10/11/2011 1137   CALCIUM  9.5 02/01/2024 0956   PROT 7.1 04/03/2024 1535   PROT 6.2 02/01/2024 0956   ALBUMIN  4.6 04/03/2024 1535   ALBUMIN  4.3 02/01/2024 0956   AST 41 (H) 04/03/2024  1535   ALT 35 04/03/2024 1535   ALKPHOS 55 04/03/2024 1535   BILITOT 0.6 04/03/2024 1535   BILITOT 0.4 02/01/2024 0956   GFRNONAA >60 10/11/2023 0216   GFRAA 106 12/10/2020 1114       Assessment and Plan:   [name] is a 58 y.o. y/o female is referred to evaluate elevated liver transaminase.  Mostly AST greater than ALT.  Concerning for alcohol use.  Differential also includes hepatic steatosis and adverse side effect of medication (oxycodone , acetaminophen , statin).  1.  Elevated liver transaminase: AST greater than ALT; recently improved - RUQ abdominal ultrasound: Check for gallstones and fatty liver - Continue medication for hyperlipidemia - Avoid all alcohol   Fibrosis 4 Score = 1.94 (Indeterminate)       Interpretation for patients with NAFLD          <1.30       -  F0-F1 (Low risk)          1.30-2.67 -  Indeterminate           >2.67      -  F3-F4 (High risk)     Validated for ages 8-65           [name], [place]  Follow up ***

## 2024-05-16 ENCOUNTER — Ambulatory Visit: Admitting: Physician Assistant

## 2024-05-19 ENCOUNTER — Other Ambulatory Visit

## 2024-05-22 DIAGNOSIS — J849 Interstitial pulmonary disease, unspecified: Secondary | ICD-10-CM | POA: Diagnosis not present

## 2024-05-22 DIAGNOSIS — Z79899 Other long term (current) drug therapy: Secondary | ICD-10-CM | POA: Diagnosis not present

## 2024-05-22 DIAGNOSIS — R5383 Other fatigue: Secondary | ICD-10-CM | POA: Diagnosis not present

## 2024-05-22 DIAGNOSIS — R748 Abnormal levels of other serum enzymes: Secondary | ICD-10-CM | POA: Diagnosis not present

## 2024-05-22 DIAGNOSIS — M5412 Radiculopathy, cervical region: Secondary | ICD-10-CM | POA: Diagnosis not present

## 2024-05-22 DIAGNOSIS — S22070S Wedge compression fracture of T9-T10 vertebra, sequela: Secondary | ICD-10-CM | POA: Diagnosis not present

## 2024-05-22 DIAGNOSIS — D539 Nutritional anemia, unspecified: Secondary | ICD-10-CM | POA: Diagnosis not present

## 2024-05-23 ENCOUNTER — Telehealth: Payer: Self-pay | Admitting: Family

## 2024-05-23 NOTE — Telephone Encounter (Signed)
 I faxed back paperwork today

## 2024-05-23 NOTE — Telephone Encounter (Signed)
 A document form [facility] has been faxed: Incontinence order, to be filled out by provider. Send document back via Fax within 7-days. Document is located in providers tray at front office.          Fax number: [phone]

## 2024-05-24 ENCOUNTER — Other Ambulatory Visit: Payer: Self-pay | Admitting: Internal Medicine

## 2024-05-24 DIAGNOSIS — R053 Chronic cough: Secondary | ICD-10-CM

## 2024-05-24 DIAGNOSIS — J449 Chronic obstructive pulmonary disease, unspecified: Secondary | ICD-10-CM

## 2024-05-26 ENCOUNTER — Ambulatory Visit: Admitting: Neurology

## 2024-06-04 ENCOUNTER — Ambulatory Visit: Payer: Self-pay | Admitting: Internal Medicine

## 2024-06-04 ENCOUNTER — Telehealth: Payer: Self-pay | Admitting: Internal Medicine

## 2024-06-04 NOTE — Telephone Encounter (Signed)
 [name]  Please try to get the path report from the [facility] lung biopsy  Thanks    SIGNATURE    Dr. Dorethia Cave, M.D., F.C.C.P,  Pulmonary and Critical Care Medicine Staff Physician, [facility] Center Director - Interstitial Lung Disease  Program  Pulmonary Fibrosis [facility], [place], [address]   Pager: [phone], If no answer  -> Check AMION or Try [phone] Telephone (clinical office): [phone] Telephone (research): [phone]  7:46 AM 06/04/2024

## 2024-06-05 NOTE — Telephone Encounter (Signed)
 Second opinion from [facility] was done on 04/10/2024. Pathology has report and has been sent to scan into chart. Report was emailed to you. Please advise if you need this emailed again. Thanks.

## 2024-06-06 NOTE — Progress Notes (Signed)
 Interstitial Lung Disease Multidisciplinary Conference   [name]    MRN [id number]    DOB 04/06/1966  Primary Care Physician:Stephens, Greig PARAS, NP  Referring Physician: Dr. Geronimo  Time of Conference: 7.30am- 8.30am Date of conference: 06/04/2024 Location of Conference: -  Virtual  Participating Pulmonary: Dr. Dorethia Geronimo Pathology: Dr Katrine Muskrat Radiology: Dr Newell Eke Others:   Brief History: Quit smoking . Had bx because I Thought CT was not prob UIP. Our path read 10/18/23 says there is infilammation but also said this is UIP. Second opinion with [facility] sent by path.  Also discuss in confernce next 2 months   PFT    Latest Ref Rng & Units 04/03/2024    1:10 PM 08/14/2023   10:59 AM 06/13/2022    9:05 AM 06/09/2021    9:05 AM  PFT Results  FVC-Pre L 1.82  1.34  1.19  1.61   FVC-Predicted Pre % 62  45  38  52   FVC-Post L  1.45   1.55   FVC-Predicted Post %  49   50   Pre FEV1/FVC % % 89  83  83  81   Post FEV1/FCV % %  82   87   FEV1-Pre L 1.63  1.12  0.98  1.30   FEV1-Predicted Pre % 71  48  40  53   FEV1-Post L  1.20   1.34   DLCO uncorrected ml/min/mmHg 7.09  7.49  7.87  8.09   DLCO UNC% % 39  41  42  43   DLCO corrected ml/min/mmHg   7.87  8.09   DLCO COR %Predicted %   42  43   DLVA Predicted % 61  72  72  83   TLC L 2.84  2.92   3.52   TLC % Predicted % 63  65   76   RV % Predicted % 46  84   110       MDD discussion of CT scan    - Date or time period of scan: HRCT: 07/24/2023 HRCT: 08/17/2022 HRCT: 03/04/2021 CT Chest WO: 10/14/2019  - Discussion synopsis:  In conference Upper Lobe bx showed: There is marked archiectrual distorian, lot of fibrosis, termporal heterogenity ,with archiectrual distortion. There is mucin pools and this is microscopic honeycombing. But fibroblastic focii of classic UIP was missing.  Then in  Immediate adjanct to it is intact lung.  In Lower lobe : aveolar spaces intact with significant  interstitial fibrosis. Loooks more like NSIP but in some areas there is archiectrual distortion with adjacent normal lung that looks more like UIP  - What is the final conclusion per 2018 ATS/Fleischner Criteria - Alternative Diagnosis but mostly fibrotic  - Concordance with official report: Not commented upon   Pathology discussion of biopsy :Radiology in 2020: mainly lung based, Mostly GGO . Some traction bronchiectasis. Minimal UL. Definite Cranio Cauld gradient. -> then in 2022: progression. More progression. 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 In 2023: more fibrotic. less GGO. In Sept 2024: Alternative Diagnosis but mostly fibrotic    MDD Impression/Recs: seems both rad and path are saying no inflammation anymore. It is all fibrosis. So Rx accordingly. Immune modulatory likely wont work   Time Spent in preparation and discussion:  > 30 min    SIGNATURE   Dr. Dorethia Geronimo, M.D., F.C.C.P,  Pulmonary and Critical Care Medicine Staff Physician, [facility] Director - Interstitial Lung  Disease  Program  Pulmonary Fibrosis [facility] at [facility] [place], [place], [address]  Pager: [phone], If no answer or between  15:00h - 7:00h: call [phone] Telephone: [phone]  1:51 PM 06/06/2024 ...................................................................................................................SABRA References: Diagnosis of Hypersensitivity Pneumonitis in Adults. An Official ATS/JRS/ALAT Clinical Practice Guideline. Ragu G et al, Am J Respir Crit Care Med. 2020 Aug 1;202(3):e36-e69.       Diagnosis of Idiopathic Pulmonary Fibrosis. An Official ATS/ERS/JRS/ALAT Clinical Practice Guideline. Raghu G et al, Am J Respir Crit Care Med. 2018 Sep 1;198(5):e44-e68.   IPF Suspected   Histopath ology Pattern      UIP  Probable UIP  Indeterminate for  UIP  Alternative  diagnosis    UIP  IPF  IPF   IPF  Non-IPF dx   HRCT   Probabe UIP  IPF  IPF  IPF (Likely)**  Non-IPF dx  Pattern  Indeterminate for UIP  IPF  IPF (Likely)**  Indeterminate  for IPF**  Non-IPF dx    Alternative diagnosis  IPF (Likely)**/ non-IPF dx  Non-IPF dx  Non-IPF dx  Non-IPF dx     Idiopathic pulmonary fibrosis diagnosis based upon HRCT and Biopsy paterns.  ** IPF is the likely diagnosis when any of following features are present:  Moderate-to-severe traction bronchiectasis/bronchiolectasis (defined as mild traction bronchiectasis/bronchiolectasis in four or more lobes including the lingual as a lobe, or moderate to severe traction bronchiectasis in two or more lobes) in a man over age 30 years or in a woman over age 60 years Extensive (>30%) reticulation on HRCT and an age >70 years  Increased neutrophils and/or absence of lymphocytosis in BAL fluid  Multidisciplinary discussion reaches a confident diagnosis of IPF.   **Indeterminate for IPF  Without an adequate biopsy is unlikely to be IPF  With an adequate biopsy may be reclassified to a more specific diagnosis after multidisciplinary discussion and/or additional consultation.   dx = diagnosis; HRCT = high-resolution computed tomography; IPF = idiopathic pulmonary fibrosis; UIP = usual interstitial pneumonia.

## 2024-06-16 ENCOUNTER — Other Ambulatory Visit

## 2024-06-18 ENCOUNTER — Other Ambulatory Visit: Payer: Self-pay | Admitting: Internal Medicine

## 2024-06-18 DIAGNOSIS — Z5181 Encounter for therapeutic drug level monitoring: Secondary | ICD-10-CM

## 2024-06-18 DIAGNOSIS — J84112 Idiopathic pulmonary fibrosis: Secondary | ICD-10-CM

## 2024-06-18 DIAGNOSIS — J849 Interstitial pulmonary disease, unspecified: Secondary | ICD-10-CM

## 2024-06-18 NOTE — Telephone Encounter (Signed)
 Patient needs to repeat LFTs. Lab appt scheduled for 06/20/2024.  Will send Ofev  refill after labs result  Sherry Pennant, PharmD, MPH, BCPS, CPP Clinical Pharmacist (Rheumatology and Pulmonology)

## 2024-06-20 ENCOUNTER — Ambulatory Visit: Payer: Self-pay | Admitting: Pharmacist

## 2024-06-20 ENCOUNTER — Other Ambulatory Visit

## 2024-06-20 DIAGNOSIS — M5412 Radiculopathy, cervical region: Secondary | ICD-10-CM | POA: Diagnosis not present

## 2024-06-20 DIAGNOSIS — Z79899 Other long term (current) drug therapy: Secondary | ICD-10-CM | POA: Diagnosis not present

## 2024-06-20 DIAGNOSIS — D539 Nutritional anemia, unspecified: Secondary | ICD-10-CM | POA: Diagnosis not present

## 2024-06-20 DIAGNOSIS — S22070S Wedge compression fracture of T9-T10 vertebra, sequela: Secondary | ICD-10-CM | POA: Diagnosis not present

## 2024-06-20 DIAGNOSIS — Z5181 Encounter for therapeutic drug level monitoring: Secondary | ICD-10-CM | POA: Diagnosis not present

## 2024-06-20 DIAGNOSIS — R748 Abnormal levels of other serum enzymes: Secondary | ICD-10-CM | POA: Diagnosis not present

## 2024-06-20 DIAGNOSIS — R5383 Other fatigue: Secondary | ICD-10-CM | POA: Diagnosis not present

## 2024-06-20 DIAGNOSIS — R030 Elevated blood-pressure reading, without diagnosis of hypertension: Secondary | ICD-10-CM | POA: Diagnosis not present

## 2024-06-20 DIAGNOSIS — J849 Interstitial pulmonary disease, unspecified: Secondary | ICD-10-CM | POA: Diagnosis not present

## 2024-06-20 LAB — HEPATIC FUNCTION PANEL
ALT: 74 U/L — ABNORMAL HIGH (ref 0–35)
AST: 68 U/L — ABNORMAL HIGH (ref 0–37)
Albumin: 4.6 g/dL (ref 3.5–5.2)
Alkaline Phosphatase: 76 U/L (ref 39–117)
Bilirubin, Direct: 0.2 mg/dL (ref 0.0–0.3)
Total Bilirubin: 0.8 mg/dL (ref 0.2–1.2)
Total Protein: 7 g/dL (ref 6.0–8.3)

## 2024-06-20 NOTE — Progress Notes (Signed)
 LFTs continue to increase. Hold Ofev  for 2 weeks. Repeat hepatic function panel in two weeks at [facility] on 07/03/2024. Patient states that pain management did eliminate all acetaminophen  products from her regimen. She does not drink alcohol. No other changes.  If LFTs normalize, consider switching to pirfenidone. (She is already on low dose of Ofev )  Has f/u with Dr. Geronimo on 07/03/2024 and GI on 07/07/2024  Patient verbalized understanding.

## 2024-06-21 DIAGNOSIS — R32 Unspecified urinary incontinence: Secondary | ICD-10-CM | POA: Diagnosis not present

## 2024-06-21 DIAGNOSIS — J449 Chronic obstructive pulmonary disease, unspecified: Secondary | ICD-10-CM | POA: Diagnosis not present

## 2024-06-25 NOTE — Telephone Encounter (Signed)
 Patient is holding Ofev  due to elevated LFTs. Rechecking hepatic function panel at upcoming OV  Sherry Pennant, PharmD, MPH, BCPS, CPP Clinical Pharmacist (Rheumatology and Pulmonology)

## 2024-07-02 NOTE — Progress Notes (Unsigned)
 OV 02/22/2021  Subjective:  Patient ID: [name], female , DOB: September 05, 1966 , age 58 y.o. , MRN: [id number] , ADDRESS: [id number] [place] [id number] PCP Jarrett Lucie SAILOR, DO Patient Care Team: Jarrett Lucie SAILOR, DO as PCP - General (Family Medicine) Patel, Donika K, DO as Consulting Physician (Neurology)  This Provider for this visit: Treatment Team:  Attending Provider: Geronimo Amel, MD    02/22/2021 -   Chief Complaint  Patient presents with   Consult    Hx of COPD, nodules on CT scan a year or 2 ago.  Pna on 4/2.     HPI [name] 58 y.o. -presents for pulm evaluation because of chronic cough and shortness of breath.  She tells me that she has cough and shortness of breath for few to several years.  Slowly it has been progressive.  In March 2022 she had COVID-19.  Treated as outpatient.  This did not change her baseline symptoms.  She feels symptoms are significant as documented below.  There is associated wheezing.  She is on Trelegy and this helps her.  Albuterol  also helps.  She says she has been given a diagnosis of COPD based on CT scan of the chest and symptoms.  She says she has had exacerbations treated by prednisone  3 times as outpatient.  This is also helped her symptoms.  She has over 25 pack smoking history.  Probably over 30 pack.  She quit for many years and then finally started smoking again 5 years ago because of stressors in life.  She finally quit again 2 weeks ago although she says it is very hard to maintain remission.  Even in the years when she quit smoking she was still exposed to the wood smoke.  Her mom continues to smoke and she is exposed to passive smoke.  Her mom has COPD so this family history of COPD.  Unclear if this alpha-1 antitrypsin phenotype.   Of note: She has poor dentition.  She is seen Dr. Glendia Budge in [place] oral surgery.  Given her chronic cough and wheezing and shortness of breath they wanted  pulmonary clearance.   Exposure hx  - She smoked from age 40 to age 25 x 1 pack/day.  Then she quit till age 38 and for the last 5 years has been smoking 1 pack a day again and then quit 2 weeks ago.  Total smoking history then becomes 53 pack/day  -She owns a large property according to history.  Unclear if it is rule out commercial or residential line.  Nevertheless she does burn a lot of wood on a regular basis constantly and gets exposed to the wood smoke.  Although does not stove is wood smoke exposure there is significant  -No roaches or mildew in the house.  No mold in the house.  No bird feather in the house.  No pet birds or parakeets.  No gerbils no hamsters.  No down pillow.  No feather jackets.  There are no roaches in the house.  CT Chest data dec 2020   IMPRESSION: Peribronchial thickening with persistent bibasilar ground-glass infiltrates which could be infectious or inflammatory.   Resolution of majority of mucous plugging since previous exam.   Atherosclerotic calcifications including coronary arteries.   Resolution of previously identified 8 mm LEFT upper lobe ground-glass nodule.   Aortic Atherosclerosis (ICD10-I70.0).     Electronically Signed   By: Oneil Vida CHRISTELLA.D.  On: 10/15/2019 08:59   1 Patient Communication  Component 1 mo ago 3/722  SARS Coronavirus 2 RESULT: POSITIVE Abnormal         PFT  No flowsheet data found.       03/08/2021 Patient presents today for follow-up. During last visit she was ordered for labs including CBC with diff, IgE, RAST allergy panel and Alpha 1 phenotype. She was also ordered for HRCT. Recommended to continue Trelegy. Pre-op eval for Dr. Glendia Budge DDS at follow-up, may need cardiology eval/clearance if imaging shows any significant cardiac findings. CT Imaging showed significant worsening of chronic extensive patchy ground glass opacities throughout both lungs with associated mild patchy subpleural reticulation  and mild cylindrical bronchiectasis. Favoring desquamative interstitial pneumonia. Three vessel coronary atherosclerosis.   She feels her breathing has improve some. She still has a np congested cough, this is not new. She is unable to produce mucus. She is still smoking 10 cigarettes a day. She is interested in quitting and has in the past several years ago. Her primary care physican just put her on chantix . She has no PFTs on file. She is needing clearance for dental work, unclear type of anesthesia. She had no issues with recent colonscopy several weeks ago.    06/09/2021 - Interim hx  Patient presents today for 3 month follow-up with PFTs. Current smoker, she is still smoking 10 cigarettes a day. She is having a hard time quitting, she tells me that everyone in her life smokes around her. She was unable to tolerate chantix  d/t nausea/vomiting for 1 month. She reports improvement in breathing with Trelegy. Uses SABA on average once a day. Albuterol  helped her wheezing after she took it today for her breathing test. She still has a chronic np cough, intermittent wheezing and dyspnea on exertion. She was unable to take mucinex because she can not swallow large pills. HRCT imaging in May 2022 showed significant worsening of smoking related ILD changes, at her last visit we stressed importance of smoking cessation. Breathing test today showed moderate-severe restriction and severe diffusion defect. 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 FENO 23.     Pulmonary function testing 06/09/2021-FVC 1.55 (50%), FEV1 1.34 (55%), ratio 87, TLC 76%, DLCOunc 8.09 (43%)  Labs:  02/25/21 - Eos absolute 700; IgE 2. Rast allergy panel negative.  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 Significant worsening of chronic extensive patchy ground-glass opacities throughout both lungs, lower lobe predominant, with associated mild patchy subpleural reticulation and mild cylindrical bronchiectasis. 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 2. Three-vessel coronary atherosclerosis. 3. Diffuse bronchial wall thickening and mild centrilobular emphysema, compatible with the provided history of COPD. 4. Aortic Atherosclerosis (ICD10-I70.0) and Emphysema (ICD10-J43.9).   Exposure hx  - She smoked from age 11 to age 54 x 1 pack/day.  Then she quit till age 70 and for the last 5 years has been smoking 1 pack a day again and then quit 2 weeks ago.  Total smoking history then becomes 63 pack/day   -She owns a large property according to history.  Unclear if it is rule out commercial or residential line.  Nevertheless she does burn a lot of wood on a regular basis constantly and gets exposed to the wood smoke.  Although does not stove is wood smoke exposure there is significant   -No roaches or mildew in the house.  No mold in the house.  No bird feather  in the house.  No pet birds or parakeets.  No gerbils no hamsters.  No down pillow.  No feather jackets.  There are no roaches in the house.   OV 06/13/2022  Subjective:  Patient ID: [name], female , DOB: [birth year] , age 58 y.o. , MRN: [id number] , ADDRESS: [address] [place] [place] [id number] PCP Carlyon Richerd PARAS, DO Patient Care Team: Carlyon Richerd PARAS, DO as PCP - General (Family Medicine) Nahser, Aleene PARAS, MD as PCP - Cardiology (Cardiology) Patel, Donika K, DO as Consulting Physician (Neurology)  This Provider for this visit: Treatment Team:  Attending Provider: Geronimo Amel, MD    06/13/2022 -   Chief Complaint  Patient presents with   Follow-up    PFT performed today.  Pt states she has been having pain in sides and back when she coughs or breathes. States her breathing has been worse.   HPI ALLESANDRA HUEBSCH 58 y.o. -returns for follow-up.  Its been a year since I saw her.  At that time there is concern significantly for DIP which is interstitial lung disease on account  of her smoking.  So we also do pulmonary function test.  She did pulmonary function test today.  She has severe restrictio nd her FEV1 is significantly declined compared to a year ago..  I personally visualized PFT.  Unable to get this to flow into the epic at this point in time.  She tells me that overall she continues to have significant cough.  She also tries to bring her to phlegm and she can taste it to be of metallic taste but unable to expectorate.  She is on Trelegy but she ran out a few weeks ago because of insurance issues.  She is also complaining about pain in her scapular region from coughing a lot.  And for the last few days she has had increased cough, congestion, tightness and increased wheezing.  She feels she is in a COPD exacerbation at this point.  Her smoking has relapsed.  People around her house smoke.  She has extensive family members with COPD.  Her mother is going to see 1 of pulmonary physicians in the office in the next few weeks.  She is interested in research studies.   OV 04/25/2023  Subjective:  Patient ID: [name], female , DOB: 1966/02/14 , age 89 y.o. , MRN: [id number] , ADDRESS: [address] [place] [id number] PCP Lorren Greig PARAS, NP Patient Care Team: Lorren Greig PARAS, NP as PCP - General (Nurse Practitioner) Nahser, Aleene PARAS, MD as PCP - Cardiology (Cardiology) Patel, Donika K, DO as Consulting Physician (Neurology)  This Provider for this visit: Treatment Team:  Attending Provider: Geronimo Amel, MD    04/25/2023 -  No chief complaint on file.   HPI ASHWINI JAGO 58 y.o. -   OV 07/03/2023  Subjective:  Patient ID: [name], female , DOB: Jun 10, 1966 , age 52 y.o. , MRN: [id number] , ADDRESS: [address] [place] [address] PCP Lorren Greig PARAS, NP Patient Care Team: Lorren Greig PARAS, NP as PCP - General (Nurse Practitioner) Nahser, Aleene PARAS, MD as PCP - Cardiology (Cardiology) Patel, Donika K, DO as Consulting Physician  (Neurology)  This Provider for this visit: Treatment Team:  Attending Provider: Geronimo Amel, MD 07/03/2023 -   Chief Complaint  Patient presents with   Follow-up    F/up      HPI [name] 58 y.o. -not seen in  over a year.  In October 2023 she had a CT scan and showed worsening ILD.  The ILD features are getting more prominent.  She missed her appointment in June 2024.  This is because a mother who had small cell cancer died.  She was the main caregiver.  She says she continues to smoke.  She is not able to afford Trelegy.  Therefore she is doing albuterol  as needed.  She is constantly coughing.  Cough is particularly worse at night but also in the daytime it is dry.  She says she is wheezing all the time.  She is also short of breath but the wheezing has gotten worse and the cough is gotten worse the shortness of breath around the same.  Of note for the last 1 days she is noticing submandibular cervical adenopathy.  Has not seen anybody for this.  There is no sore throat or fever or chills.    CT Chest data from date: *HRCT 08/17/22  - personally visualized and independently interpreted : yes - my findings are: as below  IMPRESSION: 1. The appearance of the lungs is indicative of interstitial lung disease, with some areas of progression compared to the prior study and other areas which appear improved. Overall, the spectrum of findings on today's examination is technically categorized as probable usual interstitial pneumonia (UIP) per current ATS guidelines. Repeat high-resolution chest CT is suggested in 12 months to assess for temporal changes in the appearance of the lung parenchyma. 2. Mild centrilobular and paraseptal emphysema also noted, compatible with reported clinical history of COPD. 3. Aortic atherosclerosis, in addition to left main and three-vessel coronary artery disease. Please note that although the presence of coronary artery calcium  documents the presence  of coronary artery disease, the severity of this disease and any potential stenosis cannot be assessed on this non-gated CT examination. Assessment for potential risk factor modification, dietary therapy or pharmacologic therapy may be warranted, if clinically indicated.   Aortic Atherosclerosis (ICD10-I70.0) and Emphysema (ICD10-J43.9).     Electronically Signed   By: Toribio Aye M.D.   On: 08/18/2022 09:43    OV 09/06/2023  Subjective:  Patient ID: [name], female , DOB: [birth year] , age 30 y.o. , MRN: [id number] , ADDRESS: [address] [place] [id number] PCP Lorren Greig PARAS, NP Patient Care Team: Lorren Greig PARAS, NP as PCP - General (Nurse Practitioner) Nahser, Aleene PARAS, MD as PCP - Cardiology (Cardiology) Patel, Donika K, DO as Consulting Physician (Neurology)  This Provider for this visit: Treatment Team:  Attending Provider: Geronimo Amel, MD    09/06/2023 -   Chief Complaint  Patient presents with   Follow-up    Pt would like to f/u on ct, labs and pft results. Has concerns with severe cough     HPI [name] 58 y.o. -presents for follow-up after completing some of her ILD workup.   In terms of his symptoms: These remain the same.  See below.  No new medical problems no ER visits no surgeries.  In terms of her ILD workup  -She continues to smoke  - This underwent EGD by Dr. 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  She is on antinausea medications according to chart review.  According to history the EGD was unremarkable.  I confirmed this upon record review of the her endoscopy report 09/04/2023  -  she had autoimmune serology this is noorma  -She had  a high-resolution CT chest: I agree with the radiologist there is emphysema and also air trapping.  I disagree with the radiologist after my own personal visualization that this is probable UIP.  I feel it is  indeterminate/alternative diagnosis.  What started as a groundglass opacities 4 years ago has progressed into full-blown ILD.  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  She is a smoker this could be smokers ILD as well.   CT Chest data from date: *  - personally visualized and independently interpreted : yes - my findings are: INDETERMINATE FOR UIP v ALTERNATIVE Dx  Narrative & Impression  CLINICAL DATA:  58 year old female current smoker with history of interstitial lung disease. Follow-up study.   EXAM: CT CHEST WITHOUT CONTRAST   TECHNIQUE: Multidetector CT imaging of the chest was performed following the standard protocol without intravenous contrast. High resolution imaging of the lungs, as well as inspiratory and expiratory imaging, was performed.   RADIATION DOSE REDUCTION: This exam was performed according to the departmental dose-optimization program which includes automated exposure control, adjustment of the mA and/or kV according to patient size and/or use of iterative reconstruction technique.   COMPARISON:  High-resolution chest CT 08/17/2022.   FINDINGS: Cardiovascular: Heart size is normal. There is no significant pericardial fluid, thickening or pericardial calcification. There is aortic atherosclerosis, as well as atherosclerosis of the great vessels of the mediastinum and the coronary arteries, including calcified atherosclerotic plaque in the left main, left anterior descending, left circumflex and right coronary arteries.   Mediastinum/Nodes: No pathologically enlarged mediastinal or hilar lymph nodes. Please note that accurate exclusion of hilar adenopathy is limited on noncontrast CT scans. Esophagus is unremarkable in appearance. No axillary lymphadenopathy.   Lungs/Pleura: High-resolution images again demonstrate widespread areas of ground-glass attenuation, septal thickening, subpleural reticulation, cylindrical bronchiectasis,  peripheral bronchiolectasis and thickening of the peribronchovascular interstitium. These findings are most evident throughout the mid to lower lungs bilaterally and appear clearly progressive compared to the prior study. No definitive honeycombing confidently identified. Inspiratory and expiratory imaging is remarkable for mild air trapping indicative of mild small airways disease. No pleural effusions. No definite suspicious appearing pulmonary nodules or masses are noted. Mild centrilobular and paraseptal emphysema.   Upper Abdomen: Aortic atherosclerosis.   Musculoskeletal: Bilateral breast implants are incidentally noted. Chronic appearing compression fracture of inferior endplate of T10 with proximally 20% loss of anterior vertebral body height, unchanged. There are no aggressive appearing lytic or blastic lesions noted in the visualized portions of the skeleton.   IMPRESSION: 1. Progressive interstitial lung disease once again categorized as probable usual interstitial pneumonia (UIP) per current ATS guidelines. 2. Mild air trapping indicative of mild small airways disease. 3. Mild centrilobular and paraseptal emphysema also noted. 4. Aortic atherosclerosis, in addition to left main and three-vessel coronary artery disease. Please note that although the presence of coronary artery calcium  documents the presence of coronary artery disease, the severity of this disease and any potential stenosis cannot be assessed on this non-gated CT examination. Assessment for potential risk factor modification, dietary therapy or pharmacologic therapy may be warranted, if clinically indicated.   Aortic Atherosclerosis (ICD10-I70.0) and Emphysema (ICD10-J43.9).     Electronically Signed   By: Toribio Aye M.D.   On: 08/05/2023 12:08     OV 01/01/2024  Subjective:  Patient ID: [name], female , DOB: 29-Nov-1965 , age 63 y.o. , MRN: [id number] , ADDRESS: [address] [place] [address] PCP Lorren Greig PARAS, NP Patient  Care Team: Lorren Greig PARAS, NP as PCP - General (Nurse Practitioner) Nahser, Aleene PARAS, MD as PCP - Cardiology (Cardiology) Patel, Donika K, DO as Consulting Physician (Neurology)  This Provider for this visit: Treatment Team:  Attending Provider: Geronimo Amel, MD    01/01/2024 -   Chief Complaint  Patient presents with   Follow-up    Having some right side pain after lung biopsy. She is getting over the flu- just finished tamiflu  on 12/27/23.        HPI [name] 58 y.o. -returns for follow-up after surgical lung biopsy.  She tells me that after the last visit in November 2024 she quit smoking.  Then in October 09, 2023 she underwent surgical lung biopsy by Dr. Shyrl.  The results are showing UIP but there is also inflammation.  There is microscopic honeycombing.  This needs to be discussed in the case conference.  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  From a breathing standpoint she feels she is stable but her main complaint is postoperative pain.  She says she is very tender at the incisional area which is actually healed quite well.  It gets worse when she lies down or puts pressure on it or touches it.  Today's the first day she wore a bra.  Because it is extremely sensitive and tender.  She got some opioid prescriptions from Dr. Shyrl but these are run out.  She wants me to fill that.  I have agreed to a temporary refill.  We discussed the possibility this is UIP/IPF and discussed antifibrotic therapy.  We discussed both pirfenidone and nintedanib.  She prefers nintedanib because of the easy schedule she understands diarrhea is a side effect but also requires drug-induced liver injury monitoring and understands the rare side  effects   We also discussed clinical trials as a care option and she is interested.  We gave her IPF-pro registry consent.   0   OV 04/03/2024  Subjective:  Patient ID: [name], female , DOB: [birth year] , age 70 y.o. , MRN: [id number] , ADDRESS: [address] [place] [id number] PCP Lorren Greig PARAS, NP Patient Care Team: Lorren Greig PARAS, NP as PCP - General (Nurse Practitioner) Nahser, Aleene PARAS, MD as PCP - Cardiology (Cardiology) Tobie Tonita POUR, DO as Consulting Physician (Neurology) Geronimo Amel, MD as Consulting Physician (Pulmonary Disease) Hope Almarie ORN, NP as Nurse Practitioner (Pulmonary Disease)  This Provider for this visit: Treatment Team:  Attending Provider: Geronimo Amel, MD    04/03/2024 -   Chief Complaint  Patient presents with   Follow-up    F/u pft   HPI [name] 58 y.o. -presents for follow-up.  This is to see uptake with nintedanib for diagnose of IPF.  She is now on nintedanib 100 mg twice daily.  She started on the low-dose protocol because of baseline nausea that she has had because of eating disorder and baseline fatigue.  These are not any worse.  She is not having much of her diarrhea at all.  She feels overall stable in terms of shortness of breath and also GI symptoms.  Her exercise hypoxemia test is also stable.  Of note we were supposed to discuss in the case conference yesterday but we could not.  We  also wanted to get her slides over to the [facility] clinic which we still have not accomplished.  I did share these findings with her.  I have asked navigator release to get this coordinated.  I did communicate with the ILD navigator.  Her main concern is that she has gained weight and she feels because of nintedanib.  However her BMI is normal at 25.5.  I did share this with her.  I did tell her that most patients lose weight and within normal BMI is very reassuring and I did indicate to her that we will be monitoring  this.  Of note she was supposed to join the IPF-pro registry.  She got a copy of the consent but she did not schedule.  She said her car broke and she could not make the appointment.  She is meeting the research coordinators again to fix a new appointment time.  She is interested in participating.  Of note her postoperative pain is almost resolved.  Smoking is still in remission.      OV 07/03/2024  Subjective:  Patient ID: [name], female , DOB: 04-23-1966 , age 59 y.o. 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 Aleene Passe May 2023    #ILD   - GGO mild 2020  -> slightly more proinet 2022 -> ILD/?NSIP intdeterminate pattern 2023 and 2024 WORSE  -Surgical lung biopsy by Dr. Helayne Rayas consistent with UIP or likely UIP pathology Dr. Kenith    - Second opinion from [place] [facility] in summer 2025: Fibrotic pattern but unclear specific etiology.  Reports seems to headge  - Progressive phenotype on CT chest October 2024   07/03/2024 -    Chief Complaint  Patient presents with   Medical Management of Chronic Issues   Interstitial Lung Disease    She is c/o nasal congestion and constant throat clearing. No sputum production.      HPI [name] 58 y.o. -returns for routine follow-up.  At this point in time she has quit taking nintedanib upon advice because liver function test was persistently high.  The last liver function test was it was end of August 2025.  She requires another liver function test off the nintedanib right now.  She says despite stopping nintedanib her baseline fatigue is worse.  Her baseline nausea and vomiting is persistent.  Her respiratory symptoms in terms of disease severity stable.  She had pulmonary function test today and it is stable.  The main issue is that the fatigue is worse for the last few to several weeks.  And also for  the last 1 month she has brown-colored urine with bubbles in it.  She says the primary care wants me to check a CBC and be met.  She is was to get liver function test today anyway.  She says she has had urine analysis done by the primary care already.  I did go over the claim clinic biopsy report today.  Pulmonary function test stable Symptoms: Stable.   Path [facility] 2nd opinoion: Diagnosis Comment The biopsy of the upper lobe shows diffuse areas of interstitial fibrosis with extensive peribronchiolar and mucinous metaplasia, and mucous pooling. The lung of the middle lobe (B) is mostly preserved with areas of subpleural interstitial organization and focal interstitial fibrosis similar to the upper lobe. In the less involved areas in the upper lobe (A) and in the middle lobe (B), the interstitial changes are mostly centered around the airways. In addition, the lung parenchyma in the lower lobe (C) shows extensive areas of subpleural fibroelastosis in allsamples, there are pigmented and pigmented alveolar macrophages with scattered multinucleate giant cells, and foci of  interstitial organization. Overall, based on the diffuse and airway centered distribution, though nonspecific, the findings suggest the possibility of an inhalational cause. 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 [facility]/ 04/10/2024 Electronically signed by Twyla Alfonso GAILS, MD on 04/10/2024 at 2046 ED  Dr MARLA ointerpretation of [facility] via email  Hi Arali Somera,  I went through the report but don't see that they are suggesting NSIP. The report indicates that there are some histologic features of UIP but given the bronchocentric distribution in their opinion, they do not favor UIP/ IPF. They add that the etiology of the chronic fibrosing pneumonia is unclear and they have given a differential but then comment that chronic HP is less likely.    Thanks  Katrine Muskrat MD, PhD Staff Pathologist   SYMPTOM SCALE - Pulm 02/22/2021   06/09/2021  09/06/2023  01/01/2024 Postbiopsy 10/08/2024 04/03/2024 Ofev  100mg  bid  130# 07/03/2024 Off ofev  due to raised LFT in Aug 2025  O2 use ra RA ra     Shortness of Breath 0 -> 5 scale with 5 being worst (score 6 If unable to do)       At rest 2 2 2  0/5 1 1   Simple tasks - showers, clothes change, eating, shaving 2 3 2.5 2 2.5 2.5  Household (dishes, doing bed, laundry) 3.5 4 2.5 2.5 3.5 3  Shopping 4 4 3  2.5 3.5 3.5  Walking level at own pace 2 3 3 2 2 3   Walking up Stairs 3 4 4  3.5 4 4   Total (30-36) Dyspnea Score 18.5 20 19 13  16.5 17  How bad is your cough? 4 3 Bad ribs hurts Not right now 0 1.5  How bad is your fatigue 5 5 All the time All te time 5 - baseline pre ofev  5 but worse despite stopping ofv  How bad is nausea 4 4 sometimes 5 5 - baseline esating disore 3.5  How bad is vomiting?   2 2 no  2 1 1.5  How bad is diarrhea? 0 0 no 2 0 0  How bad is anxiety? 5 3 Not bad 4 2 2.5  How bad is depression 5 3 Not bad 4 2 2.5  PFT     Latest Ref Rng & Units 07/03/2024    8:02 AM 04/03/2024    1:10 PM 08/14/2023   10:59 AM 06/13/2022    9:05 AM 06/09/2021    9:05 AM  PFT Results  FVC-Pre L 1.78  P 1.82  1.34  1.19  1.61   FVC-Predicted Pre % 60  P 62  45  38  52   FVC-Post L   1.45   1.55   FVC-Predicted Post %   49   50   Pre FEV1/FVC % % 92  P 89  83  83  81   Post FEV1/FCV % %   82   87   FEV1-Pre L 1.63  P 1.63  1.12  0.98  1.30   FEV1-Predicted Pre % 71  P 71  48  40  53   FEV1-Post L   1.20   1.34   DLCO uncorrected ml/min/mmHg 7.29  P 7.09  7.49  7.87  8.09   DLCO UNC% % 40  P 39  41  42  43   DLCO corrected ml/min/mmHg 7.29  P   7.87  8.09   DLCO COR %Predicted % 40  P   42  43   DLVA Predicted % 60  P 61  72  72  83   TLC L  2.84  2.92   3.52   TLC % Predicted %  63  65   76   RV % Predicted %  46  84   110     P Preliminary result       LAB RESULTS last 96 hours No results found.       has a past medical history of Allergy, Anemia, Anxiety, Arthritis, Cancer (HCC), CAP (community acquired pneumonia) (01/07/2020), Chronic kidney disease, Cigarette smoker (11/05/2018), COPD (chronic obstructive pulmonary disease) (HCC), Depression, Heart murmur, High cholesterol, History of endometriosis, Incontinence, Low back pain radiating to both legs (07/25/2020), Menopausal symptoms, Menopause (01/09/2012), Migraine, Myocardial infarction [facility] Night sweats (10/05/2019), Osteoporosis, Paresthesia of foot, bilateral (03/28/2019), Productive cough (12/25/2019), Renal stone (06/01/2020), Skin cancer, Thyroid  disease, and Tick bite (02/10/2019).   reports that she has quit smoking. Her smoking use included cigarettes. She has a 34 pack-year smoking history. She has been exposed to tobacco smoke. She has never used smokeless tobacco.  Past Surgical History:  Procedure  Laterality Date   ANKLE SURGERY     RIGHT   APPENDECTOMY     AUGMENTATION MAMMAPLASTY     BREAST ENHANCEMENT SURGERY     SALINE   BUNIONECTOMY     BILATERAL FEET   INTERCOSTAL NERVE BLOCK Right 10/09/2023   Procedure: INTERCOSTAL NERVE BLOCK;  Surgeon: Shyrl Linnie KIDD, MD;  Location: [facility] OR;  Service: Thoracic;  Laterality: Right;   LEFT HEART CATH AND CORONARY ANGIOGRAPHY N/A 04/13/2021   Procedure: LEFT HEART CATH AND CORONARY ANGIOGRAPHY;  Surgeon: Anner Alm ORN, MD;  Location: [facility] INVASIVE CV LAB;  Service: Cardiovascular;  Laterality: N/A;   TOOTH EXTRACTION N/A 07/11/2021   Procedure: DENTAL RESTORATION / EXTRACTIONS x 13;  Surgeon: Sheryle Hamilton, DMD;  Location: [facility];  Service: Oral Surgery;  Laterality: N/A;   TOTAL ABDOMINAL HYSTERECTOMY W/ BILATERAL SALPINGOOPHORECTOMY     TVT  10/30/2005   WISDOM TOOTH EXTRACTION     X 4    Allergies  Allergen Reactions   Dextromethorphan Other (See Comments)    keeps awake, legs constantly moving  Doxylamine Other (See Comments)    keeps awake, legs constantly moving    Pseudoeph-Doxylamine-Dm-Apap Other (See Comments)    REACTION: keeps awake, legs constantly moving  NYQUIL   Pseudoephedrine Hcl Other (See Comments)    keeps awake, legs constantly moving     Immunization History  Administered Date(s) Administered   Hepatitis B, PED/ADOLESCENT 07/09/2023   Influenza,inj,Quad PF,6+ Mos 12/10/2020   Influenza-Unspecified 07/09/2023, 06/20/2024   Pneumococcal-Unspecified 02/10/2021   Td 05/30/2002   Tdap 11/12/2011, 02/16/2021   Zoster Recombinant(Shingrix) 12/12/2020, 02/16/2021    Family History  Problem Relation Age of Onset   Diabetes Mother    Heart disease Mother    Hyperlipidemia Mother    Colon polyps Mother    Tuberculosis Father    Alcohol abuse Father    Heart disease Sister    Hyperlipidemia Sister    Heart disease Brother    Hyperlipidemia Brother    Diabetes Maternal  Grandmother    Diabetes Maternal Grandfather    Colon cancer Maternal Uncle    Stomach cancer Maternal Uncle      Current Outpatient Medications:    albuterol  (PROVENTIL ) (2.5 MG/3ML) 0.083% nebulizer solution, INHALE 3 ML BY NEBULIZATION EVERY 6 HOURS AS NEEDED FOR WHEEZING OR SHORTNESS OF BREATH, Disp: 75 mL, Rfl: 5   aspirin  81 MG chewable tablet, Chew 81 mg by mouth daily., Disp: , Rfl:    budeson-glycopyrrolate -formoterol  (BREZTRI  AEROSPHERE) 160-9-4.8 MCG/ACT AERO inhaler, Inhale 2 puffs into the lungs in the morning and at bedtime., Disp: 10.7 g, Rfl: 11   ezetimibe  (ZETIA ) 10 MG tablet, Take 1 tablet (10 mg total) by mouth daily., Disp: 90 tablet, Rfl: 3   hydrOXYzine  (VISTARIL ) 25 MG capsule, Take 1 capsule (25 mg total) by mouth every 8 (eight) hours as needed., Disp: 30 capsule, Rfl: 1   linaclotide  (LINZESS ) 145 MCG CAPS capsule, Take 1 capsule (145 mcg total) by mouth daily as needed (Constipation)., Disp: , Rfl:    naloxone (NARCAN) nasal spray 4 mg/0.1 mL, SMARTSIG:Both Nares, Disp: , Rfl:    nitroGLYCERIN  (NITROSTAT ) 0.4 MG SL tablet, Place 1 tablet (0.4 mg total) under the tongue every 5 (five) minutes as needed for chest pain., Disp: 25 tablet, Rfl: 1   ondansetron  (ZOFRAN -ODT) 4 MG disintegrating tablet, Take 1 tablet (4 mg total) by mouth every 8 (eight) hours as needed for nausea or vomiting., Disp: 30 tablet, Rfl: 2   oxyCODONE -acetaminophen  (PERCOCET/ROXICET) 5-325 MG tablet, Take 1 tablet by mouth every 6 (six) hours as needed., Disp: , Rfl:    pregabalin (LYRICA) 75 MG capsule, Take 75 mg by mouth 2 (two) times daily., Disp: , Rfl:    promethazine -dextromethorphan (PROMETHAZINE -DM) 6.25-15 MG/5ML syrup, Take 5 mLs by mouth 4 (four) times daily as needed., Disp: 118 mL, Rfl: 0   ranolazine  (RANEXA ) 500 MG 12 hr tablet, Take 1 tablet (500 mg total) by mouth 2 (two) times daily., Disp: 180 tablet, Rfl: 3   rosuvastatin  (CRESTOR ) 40 MG tablet, TAKE 1 TABLET BY MOUTH EVERY  DAY, Disp: 90 tablet, Rfl: 3   SUMAtriptan  (IMITREX ) 6 MG/0.5ML SOLN injection, Inject 0.5 mLs (6 mg total) into the skin every 2 (two) hours as needed for migraine or headache. May repeat in 2 hours if headache persists or recurs.  Limit to twice per week., Disp: 0.5 mL, Rfl: 8   traZODone  (DESYREL ) 100 MG tablet, TAKE 1 TABLET BY MOUTH EVERYDAY AT BEDTIME, Disp: 90 tablet, Rfl: 0   VENTOLIN  HFA 108 (90 Base) MCG/ACT  inhaler, INHALE 1-2 PUFFS BY MOUTH EVERY 6 HOURS AS NEEDED FOR WHEEZE OR SHORTNESS OF BREATH, Disp: 18 each, Rfl: 2   nicotine  (NICODERM CQ ) 14 mg/24hr patch, Place 1 patch (14 mg total) onto the skin daily. (Patient not taking: Reported on 07/03/2024), Disp: 30 patch, Rfl: 2   omeprazole  (PRILOSEC) 20 MG capsule, TAKE 1 CAPSULE BY MOUTH EVERY DAY (Patient not taking: Reported on 07/03/2024), Disp: 90 capsule, Rfl: 1      Objective:   Vitals:   07/03/24 0902  BP: (!) 84/58  Pulse: 63  Weight: 124 lb (56.2 kg)  Height: 5' 0.3 (1.532 m)    Estimated body mass index is 23.98 kg/m as calculated from the following:   Height as of this encounter: 5' 0.3 (1.532 m).   Weight as of this encounter: 124 lb (56.2 kg).  @WEIGHTCHANGE @  [employer]   07/03/24 0902  Weight: 124 lb (56.2 kg)     Physical Exam   General: No distress. Looks well O2 at rest: no Cane present: no Sitting in wheel chair: no Frail: no Obese: no Neuro: Alert and Oriented x 3. GCS 15. Speech normal Psych: Pleasant Resp:  Barrel Chest - no.  Wheeze - no, Crackles - YES, No overt respiratory distress CVS: Normal heart sounds. Murmurs - no Ext: Stigmata of Connective Tissue Disease - no. CLUBBING + HEENT: Normal upper airway. PEERL +. No post nasal drip        Assessment/     Assessment & Plan ILD (interstitial lung disease) (HCC)  IPF (idiopathic pulmonary fibrosis) (HCC)  Encounter for therapeutic drug monitoring  Other fatigue  Abnormal urine color  Abnormal liver function  test    PLAN Patient Instructions   ILD (interstitial lung disease) (HCC) IPF [idiopathic pulmonary fibrosis]  -According to biopsy report 10/09/2023 the results are consistent with IPF; [facility] is not sure about IPF but their reports suggest it is all scar tissue like IPF without much inflammation  - Recent rasised LFT and ofev  stopped  - stopping ofev  made no differnce to fatigue and nausea  - lung function stable x 2 years   PLAN -Check liver function test today 07/03/2024 -Hold off on ofev  - given fatigue (new worsening), urine issues (news), and recent liver function issues and lung function stabilty - hold off antifibritics for now - do HRCT in 4 months  Abnormal Color of Urine x few weeks Fatigue x few weeks  - new onset  Plan  - check cbc, bmet  and lft 07/03/2024 - follow with PCP    Emphysema  Plan   -conitnue Breztril daily  - continue albuterolas needed  Smoking - QUIT 09/06/23 > 30 pack Lung cancer screen  -glad you quit 09/06/23 - last CT sept 2024  PLAN  - Stay in remission. - caputre information on CT chest in 4 months   Follow-up - -16 weeks spiro and DLCO - symptoms score and sit/stand test at followup    FOLLOWUP    Return in about 4 months (around 11/02/2024) for 30 min visit, after HRCT chest, with Dr Geronimo.  ( Level 05 visit E&M 2024: Estb >= 40 min   visit type: on-site physical face to visit  in total care time and counseling or/and coordination of care by this undersigned MD - Dr Dorethia Geronimo. This includes one or more of the following on this same day 07/03/2024: pre-charting, chart review, note writing, documentation discussion of test results, diagnostic or treatment recommendations,  prognosis, risks and benefits of management options, instructions, education, compliance or risk-factor reduction. It excludes time spent by the CMA or office staff in the care of the patient. Actual time 40 min)   SIGNATURE    Dr.  Dorethia Cave, M.D., F.C.C.P,  Pulmonary and Critical Care Medicine Staff Physician, [facility] Director - Interstitial Lung Disease  Program  Pulmonary Fibrosis [facility] at [facility] [place], [place], [address]  Pager: [phone], If no answer or between  15:00h - 7:00h: call [phone] Telephone: [phone]  9:38 AM 07/03/2024

## 2024-07-02 NOTE — Patient Instructions (Incomplete)
  ILD (interstitial lung disease) (HCC) IPF [idiopathic pulmonary fibrosis]  -According to biopsy report 10/09/2023 the results are consistent with IPF - toleraing low dose ofev  100mg  twice daily since March 2025 wekk   - baseline fatigue and nausea without change  PLAN -Check liver function test today -Continue nintedanib 100 mg twice daily low-dose protocol  - can conside titration up in future -Consider clinical trials as a care option and glad you are interested  -Meet Mei/Jordan  for enrollment in IPF-pro registry study -Will get your biopsy read at the [facility] clinic for a second opinion  - apologize for delay in this -Will get discussed on case conference August 2025  Postoperative chest pain following surgical lung biopsy  - improved  Plan  - supportive care   Emphysema  Plan   -conitnue Breztril daily  - continue albuterolas needed  Smoking - QUIT 09/06/23  -glad you quit 09/06/23  PLAN  - Stay in remission.   Follow-up - -16 weeks perimetry and DLCO - symptoms core and sit/stand test at followup

## 2024-07-03 ENCOUNTER — Ambulatory Visit: Admitting: Internal Medicine

## 2024-07-03 ENCOUNTER — Encounter: Payer: Self-pay | Admitting: Internal Medicine

## 2024-07-03 VITALS — BP 84/58 | HR 63 | Ht 60.3 in | Wt 124.0 lb

## 2024-07-03 DIAGNOSIS — R5383 Other fatigue: Secondary | ICD-10-CM

## 2024-07-03 DIAGNOSIS — R7989 Other specified abnormal findings of blood chemistry: Secondary | ICD-10-CM

## 2024-07-03 DIAGNOSIS — J439 Emphysema, unspecified: Secondary | ICD-10-CM

## 2024-07-03 DIAGNOSIS — J84112 Idiopathic pulmonary fibrosis: Secondary | ICD-10-CM

## 2024-07-03 DIAGNOSIS — Z87891 Personal history of nicotine dependence: Secondary | ICD-10-CM

## 2024-07-03 DIAGNOSIS — Z5181 Encounter for therapeutic drug level monitoring: Secondary | ICD-10-CM

## 2024-07-03 DIAGNOSIS — G8918 Other acute postprocedural pain: Secondary | ICD-10-CM

## 2024-07-03 DIAGNOSIS — J849 Interstitial pulmonary disease, unspecified: Secondary | ICD-10-CM

## 2024-07-03 DIAGNOSIS — R3989 Other symptoms and signs involving the genitourinary system: Secondary | ICD-10-CM | POA: Diagnosis not present

## 2024-07-03 LAB — COMPREHENSIVE METABOLIC PANEL WITH GFR
ALT: 35 U/L (ref 0–35)
AST: 46 U/L — ABNORMAL HIGH (ref 0–37)
Albumin: 3.9 g/dL (ref 3.5–5.2)
Alkaline Phosphatase: 56 U/L (ref 39–117)
BUN: 8 mg/dL (ref 6–23)
CO2: 31 meq/L (ref 19–32)
Calcium: 9.1 mg/dL (ref 8.4–10.5)
Chloride: 103 meq/L (ref 96–112)
Creatinine, Ser: 1.2 mg/dL (ref 0.40–1.20)
GFR: 50.15 mL/min — ABNORMAL LOW (ref >60.00)
Glucose, Bld: 100 mg/dL — ABNORMAL HIGH (ref 70–99)
Potassium: 4 meq/L (ref 3.5–5.1)
Sodium: 142 meq/L (ref 135–145)
Total Bilirubin: 0.6 mg/dL (ref 0.2–1.2)
Total Protein: 6.2 g/dL (ref 6.0–8.3)

## 2024-07-03 LAB — PULMONARY FUNCTION TEST
DL/VA % pred: 60 %
DL/VA: 2.65 ml/min/mmHg/L
DLCO cor % pred: 40 %
DLCO cor: 7.29 ml/min/mmHg
DLCO unc % pred: 40 %
DLCO unc: 7.29 ml/min/mmHg
FEF 25-75 Pre: 3.56 L/s
FEF2575-%Pred-Pre: 156 %
FEV1-%Pred-Pre: 71 %
FEV1-Pre: 1.63 L
FEV1FVC-%Pred-Pre: 116 %
FEV6-%Pred-Pre: 62 %
FEV6-Pre: 1.76 L
FEV6FVC-%Pred-Pre: 103 %
FVC-%Pred-Pre: 60 %
FVC-Pre: 1.78 L
Pre FEV1/FVC ratio: 92 %
Pre FEV6/FVC Ratio: 100 %

## 2024-07-03 LAB — HEPATIC FUNCTION PANEL
ALT: 35 U/L (ref 0–35)
AST: 46 U/L — ABNORMAL HIGH (ref 0–37)
Albumin: 3.9 g/dL (ref 3.5–5.2)
Alkaline Phosphatase: 56 U/L (ref 39–117)
Bilirubin, Direct: 0.2 mg/dL (ref 0.0–0.3)
Total Bilirubin: 0.6 mg/dL (ref 0.2–1.2)
Total Protein: 6.2 g/dL (ref 6.0–8.3)

## 2024-07-03 LAB — CBC WITH DIFFERENTIAL/PLATELET
Basophils Absolute: 0 K/uL (ref 0.0–0.1)
Basophils Relative: 0.9 % (ref 0.0–3.0)
Eosinophils Absolute: 0 K/uL (ref 0.0–0.7)
Eosinophils Relative: 1 % (ref 0.0–5.0)
HCT: 40.3 % (ref 36.0–46.0)
Hemoglobin: 13.3 g/dL (ref 12.0–15.0)
Lymphocytes Relative: 26.4 % (ref 12.0–46.0)
Lymphs Abs: 1.4 K/uL (ref 0.7–4.0)
MCHC: 33 g/dL (ref 30.0–36.0)
MCV: 91.7 fl (ref 78.0–100.0)
Monocytes Absolute: 0.4 K/uL (ref 0.1–1.0)
Monocytes Relative: 8.3 % (ref 3.0–12.0)
Neutro Abs: 3.3 K/uL (ref 1.4–7.7)
Neutrophils Relative %: 63.4 % (ref 43.0–77.0)
Platelets: 180 K/uL (ref 150.0–400.0)
RBC: 4.39 Mil/uL (ref 3.87–5.11)
RDW: 14.9 % (ref 11.5–15.5)
WBC: 5.2 K/uL (ref 4.0–10.5)

## 2024-07-03 NOTE — Patient Instructions (Signed)
 Spirometry and diffusion capacity performed today.

## 2024-07-03 NOTE — Progress Notes (Signed)
 Spirometry and diffusion capacity performed today.

## 2024-07-04 NOTE — Telephone Encounter (Signed)
 At OV with Dr. Geronimo on 07/03/24,    Recent rasised LFT and ofev  stopped   - stopping ofev  made no differnce to fatigue and nausea   - lung function stable x 2 years     PLAN -Check liver function test today 07/03/2024 -Hold off on ofev  - given fatigue (new worsening), urine issues (news), and recent liver function issues and lung function stabilty - hold off antifibritics for now

## 2024-07-04 NOTE — Telephone Encounter (Signed)
 Most recent addendum signed prematurely due to system error.  Plan at most recent OV with Dr. Geronimo to hold off antifibrotics for now.  Pharmacy will close this encounter.   Aleck Puls, PharmD, BCPS Clinical Pharmacist  [facility]

## 2024-07-05 ENCOUNTER — Ambulatory Visit: Payer: Self-pay | Admitting: Internal Medicine

## 2024-07-05 DIAGNOSIS — J849 Interstitial pulmonary disease, unspecified: Secondary | ICD-10-CM

## 2024-07-05 NOTE — Progress Notes (Signed)
 Mild persistent but high normal AST elevation. Continue LFT check every 2 months

## 2024-07-06 NOTE — Telephone Encounter (Signed)
We are good

## 2024-07-07 ENCOUNTER — Ambulatory Visit: Admitting: Nurse Practitioner

## 2024-07-07 NOTE — Progress Notes (Signed)
 Called and spoke with patient, provided results/recommendations per Dr. Geronimo.  She verbalized understanding.  She will call back closer to the 2 month mark to schedule her lab to be drawn.  Nothing further needed.

## 2024-07-21 DIAGNOSIS — J849 Interstitial pulmonary disease, unspecified: Secondary | ICD-10-CM | POA: Diagnosis not present

## 2024-07-21 DIAGNOSIS — Z79899 Other long term (current) drug therapy: Secondary | ICD-10-CM | POA: Diagnosis not present

## 2024-07-21 DIAGNOSIS — S22070S Wedge compression fracture of T9-T10 vertebra, sequela: Secondary | ICD-10-CM | POA: Diagnosis not present

## 2024-07-21 DIAGNOSIS — M5412 Radiculopathy, cervical region: Secondary | ICD-10-CM | POA: Diagnosis not present

## 2024-07-22 ENCOUNTER — Telehealth: Payer: Self-pay

## 2024-07-22 DIAGNOSIS — R32 Unspecified urinary incontinence: Secondary | ICD-10-CM | POA: Diagnosis not present

## 2024-07-22 DIAGNOSIS — J449 Chronic obstructive pulmonary disease, unspecified: Secondary | ICD-10-CM | POA: Diagnosis not present

## 2024-07-22 DIAGNOSIS — J849 Interstitial pulmonary disease, unspecified: Secondary | ICD-10-CM

## 2024-07-22 NOTE — Telephone Encounter (Signed)
 See n sept 2024 - > just get her 4 months with spiro/dlco and hrct in 4 months approx

## 2024-07-22 NOTE — Telephone Encounter (Signed)
 PT states she wants a APT in Jan when she can have transportation and get everything completed in one visit. She said she can wait tell his schedule opens up.

## 2024-07-22 NOTE — Telephone Encounter (Unsigned)
 Copied from CRM #[id number]. Topic: Appointments - Scheduling Inquiry for Clinic >> Jul 22, 2024 12:35 PM Corean SAUNDERS wrote: Reason for CRM: Patient is requesting Dr. Geronimo to place her order for a PFT so that she may schedule it and her follow up in December. Patient is inquiring about being seen on 10/21/24 around 9:30 am as that is when her neighbor (transportation) can bring her to the clinic.    Patient also states that she is to have a chest CT completed before seeing the provider but states imaging has not called her to get scheduled.

## 2024-07-24 NOTE — Telephone Encounter (Signed)
 ATC patient x1.  No answer.  Left detailed VM per DPR letting her know that we can schedule a 30 minute breathing test (spiro and DLCO) and 4 month f/u with Dr. Geronimo, however as of right now his schedule is not available out that far.  I also left information that he wants a HRCT scan prior to her visit in January and that one of our PCCs will call her to schedule that for her.  Orders placed for HRCT scan and PFT.  Advised to call the office back.

## 2024-07-27 ENCOUNTER — Other Ambulatory Visit (HOSPITAL_BASED_OUTPATIENT_CLINIC_OR_DEPARTMENT_OTHER)

## 2024-07-28 ENCOUNTER — Other Ambulatory Visit: Payer: Self-pay | Admitting: Family

## 2024-07-28 DIAGNOSIS — G4700 Insomnia, unspecified: Secondary | ICD-10-CM

## 2024-07-28 NOTE — Telephone Encounter (Signed)
 I called and spoke with patient.  She had not gotten the message that was left for her last week.  I provided the recommendations per Dr. Geronimo.  She states she was just seen in September of 2025, not 2024.  I verified this in her chart.  She is scheduled for her HRCT scan on Saturday.  Advised to call back later to see if his schedule is out and schedule the 30 minute breathing test and the 4 month f/u with Dr. Geronimo on the same day.  She verified understanding.  Nothing further needed.

## 2024-07-28 NOTE — Telephone Encounter (Signed)
 Complete

## 2024-08-02 ENCOUNTER — Ambulatory Visit (HOSPITAL_BASED_OUTPATIENT_CLINIC_OR_DEPARTMENT_OTHER)
Admission: RE | Admit: 2024-08-02 | Discharge: 2024-08-02 | Disposition: A | Discharge status: Home / Self Care | Source: Ambulatory Visit | Attending: Internal Medicine | Admitting: Internal Medicine

## 2024-08-02 DIAGNOSIS — R5383 Other fatigue: Secondary | ICD-10-CM | POA: Insufficient documentation

## 2024-08-02 DIAGNOSIS — J849 Interstitial pulmonary disease, unspecified: Secondary | ICD-10-CM | POA: Insufficient documentation

## 2024-08-02 DIAGNOSIS — R3989 Other symptoms and signs involving the genitourinary system: Secondary | ICD-10-CM | POA: Insufficient documentation

## 2024-08-02 DIAGNOSIS — R7989 Other specified abnormal findings of blood chemistry: Secondary | ICD-10-CM | POA: Diagnosis present

## 2024-08-02 DIAGNOSIS — J84112 Idiopathic pulmonary fibrosis: Secondary | ICD-10-CM | POA: Diagnosis present

## 2024-08-02 DIAGNOSIS — Z5181 Encounter for therapeutic drug level monitoring: Secondary | ICD-10-CM | POA: Insufficient documentation

## 2024-08-03 ENCOUNTER — Other Ambulatory Visit: Payer: Self-pay | Admitting: Medical Genetics

## 2024-08-09 ENCOUNTER — Other Ambulatory Visit: Payer: Self-pay | Admitting: Family

## 2024-08-09 DIAGNOSIS — R110 Nausea: Secondary | ICD-10-CM

## 2024-08-10 NOTE — Progress Notes (Signed)
 Patient is overdue for a follow-up. Please call and schedule an appointment to see Dr Geronimo I 3-4 months after spirometry. 15 min visit fine BUT She needs to get a LFT checked NOW becaue they are alowasy elevated

## 2024-08-10 NOTE — Progress Notes (Signed)
 Good news - no chagne in fibrosis between sept 2-024 - Oct 2025 ; 13 months. Please do get a spiro.dlco I n3-4 months and then see me

## 2024-08-11 ENCOUNTER — Other Ambulatory Visit: Payer: Self-pay | Admitting: Family

## 2024-08-11 DIAGNOSIS — R110 Nausea: Secondary | ICD-10-CM

## 2024-08-11 NOTE — Telephone Encounter (Unsigned)
 Copied from CRM [id number]. Topic: Clinical - Medication Refill >> Aug 11, 2024 12:55 PM Amy B wrote: Medication: ondansetron  (ZOFRAN -ODT) 4 MG disintegrating tablet  Has the patient contacted their pharmacy? Yes (Agent: If no, request that the patient contact the pharmacy for the refill. If patient does not wish to contact the pharmacy document the reason why and proceed with request.) (Agent: If yes, when and what did the pharmacy advise?)  This is the patient's preferred pharmacy:  [id number] [place], [place] - [address] [place] [address] Phone: [phone] Fax: [phone]  Is this the correct pharmacy for this prescription? Yes If no, delete pharmacy and type the correct one.   Has the prescription been filled recently? No  Is the patient out of the medication? Yes  Has the patient been seen for an appointment in the last year OR does the patient have an upcoming appointment? Yes  Can we respond through MyChart? Yes  Agent: Please be advised that Rx refills may take up to 3 business days. We ask that you follow-up with your pharmacy.

## 2024-08-12 ENCOUNTER — Telehealth: Payer: Self-pay | Admitting: Physician Assistant

## 2024-08-12 NOTE — Telephone Encounter (Signed)
 Pt had imaging done with her pulmonologist and found she had blockages, and her heart is enlarged. She has appt set 11/5 but would still like a call back to discuss the matter. Please advise.

## 2024-08-12 NOTE — Telephone Encounter (Signed)
 Patient had chest CT performed on 10/4, ordered by pulmonologist. She reports she was told she has blockages in 3 of her heart arteries and an enlarged heart.  Patient would like to review with provider. She has many questions and is anxious regarding the cardiac findings on CT scan.  Former patient of Dr. Allena. Overdue for follow-up. Scheduled patient to see APP tomorrow afternoon.

## 2024-08-13 ENCOUNTER — Ambulatory Visit: Attending: Physician Assistant | Admitting: Physician Assistant

## 2024-08-13 ENCOUNTER — Telehealth: Payer: Self-pay | Admitting: *Deleted

## 2024-08-13 VITALS — BP 102/70 | HR 80 | Wt 121.0 lb

## 2024-08-13 DIAGNOSIS — R9389 Abnormal findings on diagnostic imaging of other specified body structures: Secondary | ICD-10-CM | POA: Insufficient documentation

## 2024-08-13 DIAGNOSIS — J849 Interstitial pulmonary disease, unspecified: Secondary | ICD-10-CM | POA: Diagnosis not present

## 2024-08-13 DIAGNOSIS — I341 Nonrheumatic mitral (valve) prolapse: Secondary | ICD-10-CM | POA: Diagnosis not present

## 2024-08-13 DIAGNOSIS — R079 Chest pain, unspecified: Secondary | ICD-10-CM | POA: Diagnosis not present

## 2024-08-13 DIAGNOSIS — I2510 Atherosclerotic heart disease of native coronary artery without angina pectoris: Secondary | ICD-10-CM | POA: Diagnosis not present

## 2024-08-13 MED ORDER — ONDANSETRON 4 MG PO TBDP: 4.0000 mg | ORAL_TABLET | Freq: Three times a day (TID) | ORAL | 2 refills | Status: DC | PRN

## 2024-08-13 NOTE — Telephone Encounter (Signed)
 Complete

## 2024-08-13 NOTE — Telephone Encounter (Signed)
 Per 07/03/24 ov with MR plan was: Follow-up - -16 weeks spiro and DLCO - symptoms score and sit/stand test at followup. Next pft and f/u around 11/02/24.     Copied from CRM [id number]. Topic: Clinical - Request for Lab/Test Order >> Aug 11, 2024 12:50 PM Russell PARAS wrote: Reason for CRM:   Pt is contacting clinic regarding the request to schedule PFT in 07/2024. She reports already having a PFT performed in 06/2024. She is going to call back next month when Ramaswamy's schedule is released to schedule her PFT and FU OV together. Requested clinic to stop sending messages.  CB#  [phone]

## 2024-08-13 NOTE — Patient Instructions (Signed)
 Medication Instructions:   Your physician recommends that you continue on your current medications as directed. Please refer to the Current Medication list given to you today.  *If you need a refill on your cardiac medications before your next appointment, please call your pharmacy*   Testing/Procedures:  Your physician has requested that you have an echocardiogram. Echocardiography is a painless test that uses sound waves to create images of your heart. It provides your doctor with information about the size and shape of your heart and how well your heart's chambers and valves are working. This procedure takes approximately one hour. There are no restrictions for this procedure. Please do NOT wear cologne, perfume, aftershave, or lotions (deodorant is allowed). Please arrive 15 minutes prior to your appointment time.  Please note: We ask at that you not bring children with you during ultrasound (echo/ vascular) testing. Due to room size and safety concerns, children are not allowed in the ultrasound rooms during exams. Our front office staff cannot provide observation of children in our lobby area while testing is being conducted. An adult accompanying a patient to their appointment will only be allowed in the ultrasound room at the discretion of the ultrasound technician under special circumstances. We apologize for any inconvenience.      Please report to Radiology at the [facility] Main Entrance 30 minutes early for your test.  [address] [place], [place] [address]                         OR   How to Prepare for Your Cardiac PET/CT Stress Test:  Nothing to eat or drink, except water, 3 hours prior to arrival time.  NO caffeine/decaffeinated products, or chocolate 12 hours prior to arrival. (Please note decaffeinated beverages (teas/coffees) still contain caffeine).  If you have caffeine within 12 hours prior, the test will need to be rescheduled.  Medication  instructions: Do not take erectile dysfunction medications for 72 hours prior to test (sildenafil, tadalafil) Do not take nitrates (isosorbide mononitrate, Ranexa ) the day before or day of test   Diabetic Preparation: If able to eat breakfast prior to 3 hour fasting, you may take all medications, including your insulin. Do not worry if you miss your breakfast dose of insulin - start at your next meal. If you do not eat prior to 3 hour fast-Hold all diabetes (oral and insulin) medications. Patients who wear a continuous glucose monitor MUST remove the device prior to scanning.  You may take your remaining medications with water.  NO perfume, cologne or lotion on chest or abdomen area. FEMALES - Please avoid wearing dresses to this appointment.  Total time is 1 to 2 hours; you may want to bring reading material for the waiting time.  IF YOU THINK YOU MAY BE PREGNANT, OR ARE NURSING PLEASE INFORM THE TECHNOLOGIST.  In preparation for your appointment, medication and supplies will be purchased.  Appointment availability is limited, so if you need to cancel or reschedule, please call the Radiology Department Scheduler at [phone] 24 hours in advance to avoid a cancellation fee of $100.00  What to Expect When you Arrive:  Once you arrive and check in for your appointment, you will be taken to a preparation room within the Radiology Department.  A technologist or Nurse will obtain your medical history, verify that you are correctly prepped for the exam, and explain the procedure.  Afterwards, an IV will be started in your  arm and electrodes will be placed on your skin for EKG monitoring during the stress portion of the exam. Then you will be escorted to the PET/CT scanner.  There, staff will get you positioned on the scanner and obtain a blood pressure and EKG.  During the exam, you will continue to be connected to the EKG and blood pressure machines.  A small, safe amount of a radioactive  tracer will be injected in your IV to obtain a series of pictures of your heart along with an injection of a stress agent.    After your Exam:  It is recommended that you eat a meal and drink a caffeinated beverage to counter act any effects of the stress agent.  Drink plenty of fluids for the remainder of the day and urinate frequently for the first couple of hours after the exam.  Your doctor will inform you of your test results within 7-10 business days.  For more information and frequently asked questions, please visit our website: [email or web address]  For questions about your test or how to prepare for your test, please call: Cardiac Imaging Nurse Navigators Office: [phone]   Follow-Up:   Your next appointment:   6 month(s)  Provider:   Dr. Vergia

## 2024-08-13 NOTE — Telephone Encounter (Signed)
 Requested medication (s) are due for refill today: yes  Requested medication (s) are on the active medication list: yes  Last refill:  04/03/24  Future visit scheduled: {Yes  Notes to clinic:  Unable to refill per protocol, cannot delegate.      Requested Prescriptions  Pending Prescriptions Disp Refills   ondansetron  (ZOFRAN -ODT) 4 MG disintegrating tablet 30 tablet 2    Sig: Take 1 tablet (4 mg total) by mouth every 8 (eight) hours as needed for nausea or vomiting.     Not Delegated - Gastroenterology: Antiemetics - ondansetron  Failed - 08/13/2024  2:28 PM      Failed - This refill cannot be delegated      Failed - AST in normal range and within 360 days    AST  Date Value Ref Range Status  07/03/2024 46 (H) 0 - 37 U/L Final  07/03/2024 46 (H) 0 - 37 U/L Final         Failed - Valid encounter within last 6 months    Recent Outpatient Visits           6 months ago Annual physical exam   [place] [facility], Amy J, NP   6 months ago Chronic obstructive pulmonary disease, unspecified COPD type [facility])   [facility], Angeline ORN, NP   7 months ago Anxiety and depression   [place] [facility], [place], NP   9 months ago Hospital discharge follow-up   [facility], Virginia J, NP   1 year ago Acute conjunctivitis of right eye, unspecified acute conjunctivitis type   [place] [facility], MD       Future Appointments             Today Janene Boer, [place] [facility] at [facility] A Dept of [employer]. [facility], H&V   In 3 weeks Darryle Thom CROME, PA-C [facility] at [employer] [facility] [employer]. [facility], H&V            Passed - ALT in normal range and within 360 days    ALT  Date Value Ref Range Status  07/03/2024 35 0 - 35 U/L Final  07/03/2024 35 0 - 35 U/L Final

## 2024-08-13 NOTE — Progress Notes (Unsigned)
 Cardiology Office Note   Date:  08/13/2024  ID:  [name], [name] 1966/09/30, MRN [id number] PCP: Lorren Greig PARAS, NP  [facility] Providers Cardiologist:  Aleene Passe, MD (Inactive) { Click to update primary MD,subspecialty MD or APP then REFRESH:1}    History of Present Illness [name] is a 58 y.o. female with past medical history of chronic intermittent chest pain, mild CAD by cath 2022, COPD, interstitial lung disease, hyperlipidemia, migraine, anxiety, MVP, tobacco abuse and mild carotid artery disease.  She was previously evaluated in May 2022 for chest pain with mixed features.  Her initial symptom was felt to be possibly pericarditis following COVID infection so she was treated with colchicine .  Lexiscan deferred due to severe wheezing on exam.  Her symptom persisted, she eventually underwent cardiac catheterization in 03/2021 that showed 20% ostial to proximal LAD lesion with otherwise luminal irregularities, normal EF, normal LVEDP.  Echocardiogram in July 2022 showed EF 60 to 65%, mild mitral valve prolapse, trivial MR.  She has severe headache after sublingual nitroglycerin .  She was seen in October 2024 for chest pain in the setting of increased caregiver stress while taking care of her terminally ill mother who passed away due to lung cancer.  She has tried Bystolic  in the past however could not tolerated as she felt she was going to pass out.  Patient was last seen by Lonell Bring, PA-C in December 2024, it was recommended for her to start on Ranexa  500 mg twice a day.  Bystolic  was removed from her med list.  She was still having intermittent chest discomfort, however given similarity of her chest discomfort was her symptom in 2022, suspicion for significant progression of coronary artery disease was very low.  Subsequent echocardiogram obtained 11/29/2023 showed EF 60 to 65%, no regional wall motion abnormality, mild to moderate MR, moderate late systolic prolapse of the mid  scallop of posterior leaflet of mitral valve.  She quit smoking in November 2024.  Pulmonary function test in 2024 showed FEV1 down to 48%, this improved on the recent PFT in September 2025.  FEV1 improved to 71%  Patient was most recently seen by Dr. Geronimo of pulmonology service on 07/03/2024.  She continued to have cough and shortness of breath has been going on for the past several years.  Trelegy seems to have helped with her symptom as well as albuterol .   High-resolution CT showed pulmonary parenchymal pattern of interstitial lung disease disease and air trapping unchanged when compared to previous study in September 2024, most compatible with fibrotic hypersensitivity pneumonitis, age advanced three-vessel coronary artery calcification.  Patient presents today for evaluation of increased chest discomfort and abnormal high-resolution CT image.  Recent high-resolution CT suggested enlarged heart, however previous echocardiogram in January was normal.  Patient has severe lung base issue.  I will obtain an echocardiogram to suggest her cardiac size and function.  She has increased chest discomfort about twice a week.  This primarily happens at rest however yesterday's episode happened with exertion.  She has significant pulmonary disease.  High-resolution CT showed quite significant coronary calcification, coronary CT is likely not a good option due to blooming effect.  I reviewed her case with Dr. Barbaraann, I will order a PET stress test to further assess.  If echocardiogram and coronary CT came back okay, she can follow-up in 6 months to establish with Dr. Barbaraann since Dr. Passe her primary cardiologist has retired  ROS: ***  Studies Reviewed      ***  Risk Assessment/Calculations {Does this patient have ATRIAL FIBRILLATION?:[phone]}         Physical Exam VS:  BP 102/70   Pulse 80   Wt 121 lb (54.9 kg)   SpO2 95%   BMI 23.40 kg/m        Wt Readings from Last 3 Encounters:  08/13/24  121 lb (54.9 kg)  07/03/24 124 lb (56.2 kg)  04/03/24 130 lb 6.4 oz (59.1 kg)    GEN: Well nourished, well developed in no acute distress NECK: No JVD; No carotid bruits CARDIAC: ***RRR, no murmurs, rubs, gallops RESPIRATORY:  Clear to auscultation without rales, wheezing or rhonchi  ABDOMEN: Soft, non-tender, non-distended EXTREMITIES:  No edema; No deformity   ASSESSMENT AND PLAN ***    {Are you ordering a CV Procedure (e.g. stress test, cath, DCCV, TEE, etc)?   Press F2        :[id number]}  Dispo: ***  Signed, Scot Ford, PA

## 2024-08-15 ENCOUNTER — Telehealth: Payer: Self-pay | Admitting: *Deleted

## 2024-08-15 DIAGNOSIS — J449 Chronic obstructive pulmonary disease, unspecified: Secondary | ICD-10-CM

## 2024-08-15 NOTE — Progress Notes (Unsigned)
 Complex Care Management Note Care Guide Note  08/15/2024 Name: [name] MRN: [id number] DOB: Oct 23, 1966   Complex Care Management Outreach Attempts: An unsuccessful telephone outreach was attempted today to offer the patient information about available complex care management services.  Follow Up Plan:  Additional outreach attempts will be made to offer the patient complex care management information and services.   Encounter Outcome:  No Answer  Thedford Franks, CMA [place]  [facility], [facility] Guide Direct Dial: [phone]  Fax: [phone] Website: [email or web address]

## 2024-08-20 DIAGNOSIS — R030 Elevated blood-pressure reading, without diagnosis of hypertension: Secondary | ICD-10-CM | POA: Diagnosis not present

## 2024-08-20 DIAGNOSIS — J849 Interstitial pulmonary disease, unspecified: Secondary | ICD-10-CM | POA: Diagnosis not present

## 2024-08-20 DIAGNOSIS — Z79899 Other long term (current) drug therapy: Secondary | ICD-10-CM | POA: Diagnosis not present

## 2024-08-20 DIAGNOSIS — M5412 Radiculopathy, cervical region: Secondary | ICD-10-CM | POA: Diagnosis not present

## 2024-08-20 DIAGNOSIS — R5383 Other fatigue: Secondary | ICD-10-CM | POA: Diagnosis not present

## 2024-08-20 DIAGNOSIS — S22070S Wedge compression fracture of T9-T10 vertebra, sequela: Secondary | ICD-10-CM | POA: Diagnosis not present

## 2024-08-21 NOTE — Progress Notes (Unsigned)
 Complex Care Management Note Care Guide Note  08/21/2024 Name: [name] MRN: [id number] DOB: 1966-01-04   Complex Care Management Outreach Attempts: A second unsuccessful outreach was attempted today to offer the patient with information about available complex care management services.  Follow Up Plan:  Additional outreach attempts will be made to offer the patient complex care management information and services.   Encounter Outcome:  No Answer  Thedford Franks, CMA [place]  [facility], [place] [facility] Guide Direct Dial: [phone]  Fax: [phone] Website: [email or web address]

## 2024-08-22 NOTE — Progress Notes (Signed)
 Complex Care Management Note Care Guide Note  08/22/2024 Name: [name] MRN: [id number] DOB: February 04, 1966   Complex Care Management Outreach Attempts: A third unsuccessful outreach was attempted today to offer the patient with information about available complex care management services.  Follow Up Plan:  No further outreach attempts will be made at this time. We have been unable to contact the patient to offer or enroll patient in complex care management services.  Encounter Outcome:  No Answer  Thedford Franks, CMA [place]  [facility], [facility] Guide Direct Dial: [phone]  Fax: [phone] Website: [email or web address]

## 2024-09-01 ENCOUNTER — Encounter: Payer: Self-pay | Admitting: Radiology

## 2024-09-03 ENCOUNTER — Ambulatory Visit: Admitting: Cardiology

## 2024-09-05 ENCOUNTER — Ambulatory Visit: Admitting: Physician Assistant

## 2024-09-05 NOTE — Telephone Encounter (Signed)
 Is there a phone number I can call? Is this a prior authorization or peer to peer review? Jazz Biddy

## 2024-09-09 ENCOUNTER — Ambulatory Visit (HOSPITAL_COMMUNITY)

## 2024-09-10 ENCOUNTER — Other Ambulatory Visit: Payer: Self-pay | Admitting: Cardiology

## 2024-09-10 ENCOUNTER — Telehealth: Payer: Self-pay | Admitting: Physician Assistant

## 2024-09-10 ENCOUNTER — Other Ambulatory Visit: Payer: Self-pay | Admitting: Physician Assistant

## 2024-09-10 DIAGNOSIS — R0602 Shortness of breath: Secondary | ICD-10-CM

## 2024-09-10 DIAGNOSIS — R079 Chest pain, unspecified: Secondary | ICD-10-CM

## 2024-09-10 MED ORDER — EZETIMIBE 10 MG PO TABS: 10.0000 mg | ORAL_TABLET | Freq: Every day | ORAL | 3 refills | Status: DC

## 2024-09-10 NOTE — Telephone Encounter (Signed)
   You are scheduled for a Myocardial Perfusion Imaging Study.   Please arrive 15 minutes prior to your appointment time for registration and insurance purposes. Lexiscan Myoview ordered and instructions sent to pt's mychart.   The test will take approximately 3 to 4 hours to complete; you may bring reading material.  If someone comes with you to your appointment, they will need to remain in the main lobby due to limited space in the testing area. **If you are pregnant or breastfeeding, please notify the nuclear lab prior to your appointment**  How to prepare for your Myocardial Perfusion Test: Do not eat or drink 3 hours prior to your test, except you may have water. Do not consume products containing caffeine (regular or decaffeinated) 12 hours prior to your test. (ex: coffee, chocolate, sodas, tea). Do bring a list of your current medications with you.  If not listed below, you may take your medications as normal. Do wear comfortable clothes (no dresses or overalls) and walking shoes, tennis shoes preferred (No heels or open toe shoes are allowed). Do NOT wear cologne, perfume, aftershave, or lotions (deodorant is allowed). If these instructions are not followed, your test will have to be rescheduled.

## 2024-09-10 NOTE — Telephone Encounter (Signed)
 Spoke to our [employer].  Apparently the patient's insurance does not cover PET stress test.  Will switch to SPECT Myoview.  Risk and the benefit of the SPECT Myoview has been explained to the patient who is willing to proceed.  She has been instructed to hold any kind of caffeine for 12 hours prior to the SPECT Myoview.  Purpose of the SPECT Myoview is for evaluation of chest pain and worsening shortness of breath.  Benefit and risk of the Myoview has been explained to the patient include shortness of breath, chest pain, flushing sensation, nausea vomiting and severe headache.

## 2024-09-17 DIAGNOSIS — R030 Elevated blood-pressure reading, without diagnosis of hypertension: Secondary | ICD-10-CM | POA: Diagnosis not present

## 2024-09-17 DIAGNOSIS — R5383 Other fatigue: Secondary | ICD-10-CM | POA: Diagnosis not present

## 2024-09-17 DIAGNOSIS — S22070S Wedge compression fracture of T9-T10 vertebra, sequela: Secondary | ICD-10-CM | POA: Diagnosis not present

## 2024-09-17 DIAGNOSIS — J849 Interstitial pulmonary disease, unspecified: Secondary | ICD-10-CM | POA: Diagnosis not present

## 2024-09-17 DIAGNOSIS — Z79899 Other long term (current) drug therapy: Secondary | ICD-10-CM | POA: Diagnosis not present

## 2024-09-17 DIAGNOSIS — M5412 Radiculopathy, cervical region: Secondary | ICD-10-CM | POA: Diagnosis not present

## 2024-09-30 ENCOUNTER — Ambulatory Visit: Admitting: Physician Assistant

## 2024-10-02 ENCOUNTER — Telehealth (HOSPITAL_COMMUNITY): Payer: Self-pay | Admitting: *Deleted

## 2024-10-02 ENCOUNTER — Other Ambulatory Visit: Payer: Self-pay | Admitting: Physician Assistant

## 2024-10-02 DIAGNOSIS — R079 Chest pain, unspecified: Secondary | ICD-10-CM

## 2024-10-02 DIAGNOSIS — R0602 Shortness of breath: Secondary | ICD-10-CM

## 2024-10-02 NOTE — Telephone Encounter (Signed)
 Left a detailed message with instructions regarding a STRESS TEST for 10/03/24 at 11:00.

## 2024-10-03 ENCOUNTER — Ambulatory Visit: Admitting: Physician Assistant

## 2024-10-03 ENCOUNTER — Ambulatory Visit: Payer: Self-pay | Admitting: Physician Assistant

## 2024-10-03 ENCOUNTER — Ambulatory Visit (HOSPITAL_COMMUNITY)
Admission: RE | Admit: 2024-10-03 | Discharge: 2024-10-03 | Disposition: A | Source: Ambulatory Visit | Attending: Physician Assistant

## 2024-10-03 ENCOUNTER — Ambulatory Visit (HOSPITAL_COMMUNITY)

## 2024-10-03 ENCOUNTER — Ambulatory Visit (HOSPITAL_COMMUNITY): Admission: RE | Admit: 2024-10-03 | Discharge: 2024-10-03 | Attending: Physician Assistant

## 2024-10-03 DIAGNOSIS — R079 Chest pain, unspecified: Secondary | ICD-10-CM

## 2024-10-03 DIAGNOSIS — I341 Nonrheumatic mitral (valve) prolapse: Secondary | ICD-10-CM

## 2024-10-03 DIAGNOSIS — R0602 Shortness of breath: Secondary | ICD-10-CM | POA: Diagnosis not present

## 2024-10-03 DIAGNOSIS — I2510 Atherosclerotic heart disease of native coronary artery without angina pectoris: Secondary | ICD-10-CM

## 2024-10-03 LAB — MYOCARDIAL PERFUSION IMAGING
Base ST Depression (mm): 0 mm
LV dias vol: 77 mL (ref 46–106)
LV sys vol: 28 mL (ref 3.8–5.2)
Nuc Stress EF: 64 %
Peak HR: 81 {beats}/min
Rest HR: 56 {beats}/min
Rest Nuclear Isotope Dose: 10.1 mCi
SDS: 4
SRS: 3
SSS: 6
ST Depression (mm): 0 mm
Stress Nuclear Isotope Dose: 31.4 mCi
TID: 0.96

## 2024-10-03 LAB — ECHOCARDIOGRAM COMPLETE
Area-P 1/2: 3.61 cm2
Height: 60 in
S' Lateral: 2.8 cm
Weight: 1936 [oz_av]

## 2024-10-03 MED ORDER — REGADENOSON 0.4 MG/5ML IV SOLN
INTRAVENOUS | Status: AC
  Filled 2024-10-03: qty 5

## 2024-10-03 MED ORDER — REGADENOSON 0.4 MG/5ML IV SOLN
0.4000 mg | Freq: Once | INTRAVENOUS | Status: AC
  Administered 2024-10-03: 0.4 mg via INTRAVENOUS

## 2024-10-03 MED ORDER — TECHNETIUM TC 99M TETROFOSMIN IV KIT
31.4000 | PACK | Freq: Once | INTRAVENOUS | Status: AC | PRN
  Administered 2024-10-03: 31.4 via INTRAVENOUS

## 2024-10-03 MED ORDER — TECHNETIUM TC 99M TETROFOSMIN IV KIT
10.1000 | PACK | Freq: Once | INTRAVENOUS | Status: AC | PRN
  Administered 2024-10-03: 10.1 via INTRAVENOUS

## 2024-10-03 MED ORDER — TECHNETIUM TC 99M TETROFOSMIN IV KIT: 32.4000 | PACK | Freq: Once | INTRAVENOUS | Status: DC | PRN

## 2024-10-03 MED ORDER — REGADENOSON 0.4 MG/5ML IV SOLN: 0.4000 mg | Freq: Once | INTRAVENOUS | Status: DC

## 2024-10-03 NOTE — Progress Notes (Signed)
 Please arrange a earlier visit in the next week or two to review stress test result.

## 2024-10-03 NOTE — Progress Notes (Signed)
 This will need to be a in-office visit with EKG. However if it is easier for Mrs. Rolene, she can take any of the PV slot, 72 hour preop, 24 hours slot and HeartFirst slots. I have opening on 12/8, 12/12, 12/18, 12/19 and 12/22nd, will any of those days work?

## 2024-10-14 ENCOUNTER — Ambulatory Visit: Admitting: Internal Medicine

## 2024-10-17 DIAGNOSIS — R0989 Other specified symptoms and signs involving the circulatory and respiratory systems: Secondary | ICD-10-CM | POA: Diagnosis not present

## 2024-10-17 DIAGNOSIS — M5412 Radiculopathy, cervical region: Secondary | ICD-10-CM | POA: Diagnosis not present

## 2024-10-17 DIAGNOSIS — J111 Influenza due to unidentified influenza virus with other respiratory manifestations: Secondary | ICD-10-CM | POA: Diagnosis not present

## 2024-10-17 DIAGNOSIS — Z20822 Contact with and (suspected) exposure to covid-19: Secondary | ICD-10-CM | POA: Diagnosis not present

## 2024-10-17 DIAGNOSIS — Z6822 Body mass index (BMI) 22.0-22.9, adult: Secondary | ICD-10-CM | POA: Diagnosis not present

## 2024-10-17 DIAGNOSIS — Z79899 Other long term (current) drug therapy: Secondary | ICD-10-CM | POA: Diagnosis not present

## 2024-10-17 DIAGNOSIS — J069 Acute upper respiratory infection, unspecified: Secondary | ICD-10-CM | POA: Diagnosis not present

## 2024-10-17 DIAGNOSIS — S22070S Wedge compression fracture of T9-T10 vertebra, sequela: Secondary | ICD-10-CM | POA: Diagnosis not present

## 2024-10-18 ENCOUNTER — Other Ambulatory Visit: Payer: Self-pay | Admitting: Family

## 2024-10-18 DIAGNOSIS — G4700 Insomnia, unspecified: Secondary | ICD-10-CM

## 2024-10-19 ENCOUNTER — Other Ambulatory Visit: Payer: Self-pay | Admitting: Family

## 2024-10-19 DIAGNOSIS — R110 Nausea: Secondary | ICD-10-CM

## 2024-10-20 ENCOUNTER — Other Ambulatory Visit: Payer: Self-pay

## 2024-10-20 NOTE — Telephone Encounter (Signed)
 Complete. Schedule appointment. Last office visit 02/01/2024.

## 2024-10-20 NOTE — Telephone Encounter (Signed)
 Pt scheduled

## 2024-10-21 MED ORDER — RANOLAZINE ER 500 MG PO TB12: 500.0000 mg | ORAL_TABLET | Freq: Two times a day (BID) | ORAL | 3 refills | Status: AC

## 2024-10-21 NOTE — Telephone Encounter (Signed)
 Pt scheduled

## 2024-10-22 DIAGNOSIS — J449 Chronic obstructive pulmonary disease, unspecified: Secondary | ICD-10-CM | POA: Diagnosis not present

## 2024-10-22 DIAGNOSIS — R32 Unspecified urinary incontinence: Secondary | ICD-10-CM | POA: Diagnosis not present

## 2024-10-22 DIAGNOSIS — Z79899 Other long term (current) drug therapy: Secondary | ICD-10-CM | POA: Diagnosis not present

## 2024-10-28 ENCOUNTER — Encounter: Payer: Self-pay | Admitting: Physician Assistant

## 2024-10-28 ENCOUNTER — Ambulatory Visit: Attending: Physician Assistant | Admitting: Physician Assistant

## 2024-10-28 VITALS — BP 106/62 | HR 60 | Ht 60.0 in | Wt 124.0 lb

## 2024-10-28 DIAGNOSIS — J449 Chronic obstructive pulmonary disease, unspecified: Secondary | ICD-10-CM

## 2024-10-28 DIAGNOSIS — J849 Interstitial pulmonary disease, unspecified: Secondary | ICD-10-CM | POA: Insufficient documentation

## 2024-10-28 DIAGNOSIS — Z01818 Encounter for other preprocedural examination: Secondary | ICD-10-CM | POA: Diagnosis present

## 2024-10-28 DIAGNOSIS — R9439 Abnormal result of other cardiovascular function study: Secondary | ICD-10-CM | POA: Insufficient documentation

## 2024-10-28 DIAGNOSIS — I341 Nonrheumatic mitral (valve) prolapse: Secondary | ICD-10-CM | POA: Diagnosis present

## 2024-10-28 DIAGNOSIS — E785 Hyperlipidemia, unspecified: Secondary | ICD-10-CM | POA: Insufficient documentation

## 2024-10-28 LAB — BASIC METABOLIC PANEL WITH GFR
BUN/Creatinine Ratio: 8 — ABNORMAL LOW (ref 9–23)
BUN: 11 mg/dL (ref 6–24)
CO2: 28 mmol/L (ref 20–29)
Calcium: 9.9 mg/dL (ref 8.7–10.2)
Chloride: 100 mmol/L (ref 96–106)
Creatinine, Ser: 1.31 mg/dL — ABNORMAL HIGH (ref 0.57–1.00)
Glucose: 95 mg/dL (ref 70–99)
Potassium: 4.2 mmol/L (ref 3.5–5.2)
Sodium: 142 mmol/L (ref 134–144)
eGFR: 47 mL/min/1.73 — ABNORMAL LOW

## 2024-10-28 LAB — CBC
Hematocrit: 40.7 % (ref 34.0–46.6)
Hemoglobin: 13.4 g/dL (ref 11.1–15.9)
MCH: 30.7 pg (ref 26.6–33.0)
MCHC: 32.9 g/dL (ref 31.5–35.7)
MCV: 93 fL (ref 79–97)
Platelets: 235 x10E3/uL (ref 150–450)
RBC: 4.37 x10E6/uL (ref 3.77–5.28)
RDW: 13.5 % (ref 11.7–15.4)
WBC: 7.9 x10E3/uL (ref 3.4–10.8)

## 2024-10-28 NOTE — Patient Instructions (Signed)
 Medication Instructions:  NO CHANGES *If you need a refill on your cardiac medications before your next appointment, please call your pharmacy*  Lab Work: CBC AND BMET TODAY If you have labs (blood work) drawn today and your tests are completely normal, you will receive your results only by: MyChart Message (if you have MyChart) OR A paper copy in the mail If you have any lab test that is abnormal or we need to change your treatment, we will call you to review the results.  Testing/Procedures:  [place] [facility] A DEPT OF [place]. [facility] AT [address] A DEPT OF THE [place]. [facility] [address] [place] [address] Dept: [phone] Loc: [phone]  [name]  10/28/2024  You are scheduled for a Cardiac Catheterization on Tuesday, January 13 with Dr. Alm Clay.  1. Please arrive at the [facility] (Main Entrance A) at [facility]: [address] [place], [place] [address] at 5:30 AM (This time is 2 hour(s) before your procedure to ensure your preparation).   Free valet parking service is available. You will check in at ADMITTING. The support person will be asked to wait in the waiting room.  It is OK to have someone drop you off and come back when you are ready to be discharged.    Special note: Every effort is made to have your procedure done on time. Please understand that emergencies sometimes delay scheduled procedures.  2. Diet: Nothing to eat after midnight.   3. Hydration: Nothing to eat and drink after midnight.  4. Labs: You will need to have blood drawn on prior to procedure. You do not need to be fasting.  5. Medication instructions in preparation for your procedure:   Contrast Allergy: No  NO MEDICATIONS NEED TO BE HELD, MAY TAKE MEDICATIONS AS NORMAL  On the morning of your procedure, take your Aspirin  81 mg and any morning medicines NOT listed above.  You may use sips of water.  6. Plan to go home the  same day, you will only stay overnight if medically necessary. 7. Bring a current list of your medications and current insurance cards. 8. You MUST have a responsible person to drive you home. 9. Someone MUST be with you the first 24 hours after you arrive home or your discharge will be delayed. 10. Please wear clothes that are easy to get on and off and wear slip-on shoes.  Thank you for allowing us  to care for you!   -- [place] [facility]   Follow-Up: At [facility], you and your health needs are our priority.  As part of our continuing mission to provide you with exceptional heart care, our providers are all part of one team.  This team includes your primary Cardiologist (physician) and Advanced Practice Providers or APPs (Physician Assistants and Nurse Practitioners) who all work together to provide you with the care you need, when you need it.  Your next appointment:   4-5 week(s) AFTER HEART CATH  Provider:   Alm Clay, MD or Scot Ford, PA-C

## 2024-10-28 NOTE — H&P (View-Only) (Signed)
 " Cardiology Office Note   Date:  10/29/2024  ID:  Natalie, [name] [birth year], MRN [id number] PCP: Jaycee Greig PARAS, NP  [facility] Providers Cardiologist:  Aleene Passe, MD (Inactive)   -- Dr. Barbaraann  History of Present Illness [name] is a 58 y.o. female with past medical history of chronic intermittent chest pain, mild CAD by cath 2022, COPD, interstitial lung disease, hyperlipidemia, migraine, anxiety, MVP, tobacco abuse and mild carotid artery disease. She was previously evaluated in May 2022 for chest pain with mixed features. Her initial symptom was felt to be possibly pericarditis following COVID infection so she was treated with colchicine . Lexiscan  deferred due to severe wheezing on exam. Her symptom persisted, she eventually underwent cardiac catheterization in 03/2021 that showed 20% ostial to proximal LAD lesion with otherwise luminal irregularities, normal EF, normal LVEDP. Echocardiogram in July 2022 showed EF 60 to 65%, mild mitral valve prolapse, trivial MR. She has severe headache after sublingual nitroglycerin . She was seen in October 2024 for chest pain in the setting of increased caregiver stress while taking care of her terminally ill mother who passed away due to lung cancer. She has tried Bystolic  in the past however could not tolerated as she felt she was going to pass out. Patient was last seen by Lonell Bring, PA-C in December 2024, it was recommended for her to start on Ranexa  500 mg twice a day. Bystolic  was removed from her med list. She was still having intermittent chest discomfort, however given similarity of her chest discomfort was her symptom in 2022, suspicion for significant progression of coronary artery disease was very low. Subsequent echocardiogram obtained 11/29/2023 showed EF 60 to 65%, no regional wall motion abnormality, mild to moderate MR, moderate late systolic prolapse of the mid scallop of posterior leaflet of mitral valve. She quit smoking in  November 2024. Pulmonary function test in 2024 showed FEV1 down to 48%, this improved on the recent PFT in September 2025. FEV1 improved to 71%.   Patient was most recently seen by Dr. Geronimo of pulmonology service on 07/03/2024.  She continued to have cough and shortness of breath has been going on for the past several years.  Trelegy seems to have helped with her symptom as well as albuterol .   High-resolution CT showed pulmonary parenchymal pattern of interstitial lung disease disease and air trapping unchanged when compared to previous study in September 2024, most compatible with fibrotic hypersensitivity pneumonitis, age advanced three-vessel coronary artery calcification.  I last saw the patient on 08/13/2024 for increased chest discomfort and abnormal high-resolution CT imaging that suggested enlarged heart.  I recommended repeat echocardiogram and a PET stress test.  I reviewed her case with Dr. Barbaraann, due to heavy calcification, coronary CT was not ideal study.  Echocardiogram obtained on 10/03/2024 showed EF 60 to 65%, no regional wall motion abnormality, normal RV size and normal RV systolic pressure, mild prolapse of the posterior leaflet of mitral valve with mild to moderate MR.  PET stress test was turned down by insurance.  We ultimately proceeded with Lexiscan  Myoview  that showed intermediate risk study was medium defect of moderate reduction in uptake present in the apical to mid anteroseptal location that is reversible, this is consistent with area of ischemia, EF 64%.  Patient presents today for follow-up.  She continued to have multiple episode of chest pain per week.  Symptom lasted only a few minutes at a time and may occur both at rest and with exertion.  We reviewed the recent echocardiogram and Myoview , I also discussed her case with DOD Dr. Francyne, we recommend the patient proceed with a left and right heart cath.  The right heart cath portion will assess the degree of pulmonary  implication on her heart.  She will need a basic metabolic panel and a CBC.  She can follow-up with cardiology service in 4 to 5 weeks.    ROS:   Patient has chronic shortness of breath with exertion.  She continued to have intermittent chest discomfort.  She has no lower extremity edema, orthopnea or PND.   Studies Reviewed EKG Interpretation Date/Time:  Tuesday October 28 2024 15:20:07 EST Ventricular Rate:  60 PR Interval:  142 QRS Duration:  94 QT Interval:  406 QTC Calculation: 406 R Axis:   109  Text Interpretation: Normal sinus rhythm Rightward axis Incomplete right bundle branch block Chronic T wave inversion in the inferolateral leads Incomplete right bundle branch block is now Present Nonspecific T wave abnormality has replaced inverted T waves in Lateral leads Confirmed by Jaclyne Haverstick [phone]) on 10/28/2024 3:28:50 PM    Cardiac Studies & Procedures   ______________________________________________________________________________________________ CARDIAC CATHETERIZATION  CARDIAC CATHETERIZATION 04/13/2021  Conclusion  Ost LAD to Prox LAD lesion is 20% stenosed.  Otherwise minimal luminal irregularities  The left ventricular systolic function is normal.  LV end diastolic pressure is normal.  The left ventricular ejection fraction is 55-65% by visual estimate.  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  Otherwise minimal CAD of the LCx or RCA  Normal LVEF & LVEDP   RECOMMENDATION  Consider Microvascular Disease as cause of symptoms concerning for Angina  Did not start Ranexa  due to potential medication interaction  Follow-up with Primary Cardiologist    Alm Clay, MD  Findings Coronary Findings Diagnostic  Dominance: Right  Left Main Vessel was injected. Vessel is moderate in size. The vessel exhibits minimal luminal irregularities.  Left Anterior Descending Ost LAD to Prox  LAD lesion is 20% stenosed. The lesion is eccentric. The lesion is calcified. Mostly Positive Remodeling - Calcium  seen on Non-Injection Imaging.  First Diagonal Branch Vessel is small in size.  Second Septal Branch Vessel is small in size.  Third Diagonal Branch Vessel is small in size.  Left Circumflex The vessel exhibits minimal luminal irregularities.  First Obtuse Marginal Branch Vessel is small in size.  Left Posterior Atrioventricular Artery Vessel is small in size.  Right Coronary Artery Vessel was injected. Vessel is normal in caliber. The vessel exhibits minimal luminal irregularities. The vessel is mildly tortuous.  Acute Marginal Branch Vessel is small in size.  Right Ventricular Branch Vessel is small in size.  Right Posterior Descending Artery Vessel is small in size.  First Right Posterolateral Branch Vessel is small in size.  Second Right Posterolateral Branch Vessel is moderate in size.  Third Right Posterolateral Branch Vessel is small in size.  Intervention  No interventions have been documented.   STRESS TESTS  MYOCARDIAL PERFUSION IMAGING 10/03/2024  Interpretation Summary   Findings are consistent with ischemia. The study is intermediate risk.   No ST deviation was noted.   LV perfusion is abnormal. There is evidence of ischemia. There is no evidence of infarction. Defect 1: There is a medium defect with moderate reduction in uptake present in the apical to mid anteroseptal location(s) that is reversible. There is normal wall motion in the defect area. Consistent with ischemia.  Left ventricular function is normal. Nuclear stress EF: 64%. The left ventricular ejection fraction is normal (55-65%). End diastolic cavity size is normal. End systolic cavity size is normal. No evidence of transient ischemic dilation (TID) noted.   CT images were obtained for attenuation correction and were examined for the presence of coronary calcium  when  appropriate.   Coronary calcium  was present on the attenuation correction CT images. Moderate coronary calcifications were present. Coronary calcifications were present in the left anterior descending artery, left circumflex artery and right coronary artery distribution(s).   Prior study not available for comparison.   ECHOCARDIOGRAM  ECHOCARDIOGRAM COMPLETE 10/03/2024  Narrative ECHOCARDIOGRAM REPORT    Patient Name:   [name]  Date of Exam: 10/03/2024 Medical Rec #:  [id number]     Height:       60.3 in Accession #:    [id number]    Weight:       121.0 lb Date of Birth:  1966/10/03    BSA:          1.513 m Patient Age:    58 years      BP:           102/70 mmHg Patient Gender: F             HR:           55 bpm. Exam Location:  [address]  Procedure: 2D Echo, Cardiac Doppler and Color Doppler (Both Spectral and Color Flow Doppler were utilized during procedure).  Indications:    R07.9 Chest pain  History:        Patient has prior history of Echocardiogram examinations, most recent 11/29/2023. Previous Myocardial Infarction, COPD, Mitral Valve Prolapse, Signs/Symptoms:Chest Pain; Risk Factors:Dyslipidemia and Former Smoker.  Sonographer:    Elsie Bohr RDCS Referring Phys: [phone] Tamaj Jurgens  IMPRESSIONS   1. Left ventricular ejection fraction, by estimation, is 60 to 65%. The left ventricle has normal function. The left ventricle has no regional wall motion abnormalities. Left ventricular diastolic parameters are indeterminate. 2. Right ventricular systolic function is normal. The right ventricular size is normal. There is normal pulmonary artery systolic pressure. 3. The mitral valve is normal in structure. Late peaking mild mitral valve regurgitation. No evidence of mitral stenosis. There is mild prolapse of posterior leaflet of the mitral valve. 4. The aortic valve is normal in structure. Aortic valve regurgitation is not visualized. No aortic stenosis is  present. 5. The inferior vena cava is normal in size with greater than 50% respiratory variability, suggesting right atrial pressure of 3 mmHg.  Comparison(s): A prior study was performed on 11/29/2023. LVEF 60-65%, mild to moderate MR, posterior mitral valve prolapse.  FINDINGS Left Ventricle: Left ventricular ejection fraction, by estimation, is 60 to 65%. The left ventricle has normal function. The left ventricle has no regional wall motion abnormalities. The left ventricular internal cavity size was normal in size. There is no left ventricular hypertrophy. Left ventricular diastolic parameters are indeterminate.  Right Ventricle: The right ventricular size is normal. No increase in right ventricular wall thickness. Right ventricular systolic function is normal. There is normal pulmonary artery systolic pressure. The tricuspid regurgitant velocity is 2.04 m/s, and with an assumed right atrial pressure of 3 mmHg, the estimated right ventricular systolic pressure is 19.6 mmHg.  Left Atrium: Left atrial size was normal in size.  Right Atrium: Right atrial size was normal in size.  Pericardium: There is no evidence of pericardial effusion.  Mitral Valve: The  mitral valve is normal in structure. There is mild prolapse of posterior leaflet of the mitral valve. Late peaking mild mitral valve regurgitation. No evidence of mitral valve stenosis.  Tricuspid Valve: The tricuspid valve is normal in structure. Tricuspid valve regurgitation is trivial. No evidence of tricuspid stenosis.  Aortic Valve: The aortic valve is normal in structure. Aortic valve regurgitation is not visualized. No aortic stenosis is present.  Pulmonic Valve: The pulmonic valve was normal in structure. Pulmonic valve regurgitation is mild. No evidence of pulmonic stenosis.  Aorta: The aortic root and ascending aorta are structurally normal, with no evidence of dilitation.  Venous: The inferior vena cava is normal in size  with greater than 50% respiratory variability, suggesting right atrial pressure of 3 mmHg.  IAS/Shunts: No atrial level shunt detected by color flow Doppler.   LEFT VENTRICLE PLAX 2D LVIDd:         4.60 cm   Diastology LVIDs:         2.80 cm   LV e' medial:    6.31 cm/s LV PW:         0.90 cm   LV E/e' medial:  17.6 LV IVS:        0.90 cm   LV e' lateral:   8.70 cm/s LVOT diam:     1.90 cm   LV E/e' lateral: 12.8 LV SV:         58 LV SV Index:   39 LVOT Area:     2.84 cm   RIGHT VENTRICLE             IVC RV S prime:     11.30 cm/s  IVC diam: 1.30 cm TAPSE (M-mode): 2.0 cm RVSP:           19.6 mmHg  LEFT ATRIUM             Index        RIGHT ATRIUM           Index LA diam:        3.80 cm 2.51 cm/m   RA Pressure: 3.00 mmHg LA Vol (A2C):   44.4 ml 29.34 ml/m  RA Area:     12.10 cm LA Vol (A4C):   34.8 ml 22.99 ml/m  RA Volume:   26.60 ml  17.58 ml/m LA Biplane Vol: 41.4 ml 27.36 ml/m AORTIC VALVE LVOT Vmax:   91.80 cm/s LVOT Vmean:  56.400 cm/s LVOT VTI:    0.206 m  AORTA Ao Root diam: 3.10 cm Ao Asc diam:  3.30 cm  MITRAL VALVE                TRICUSPID VALVE MV Area (PHT): 3.61 cm     TR Peak grad:   16.6 mmHg MV Decel Time: 210 msec     TR Vmax:        204.00 cm/s MV E velocity: 111.00 cm/s  Estimated RAP:  3.00 mmHg MV A velocity: 67.10 cm/s   RVSP:           19.6 mmHg MV E/A ratio:  1.65 SHUNTS Systemic VTI:  0.21 m Systemic Diam: 1.90 cm  Sunit Tolia Electronically signed by Madonna Large Signature Date/Time: 10/03/2024/3:09:01 PM    Final          ______________________________________________________________________________________________      Risk Assessment/Calculations           Physical Exam VS:  BP 106/62 (BP Location: Left Arm, Patient Position: Sitting, Cuff Size:  Normal)   Pulse 60   Ht 5' (1.524 m)   Wt 124 lb (56.2 kg)   SpO2 92%   BMI 24.22 kg/m        Wt Readings from Last 3 Encounters:  10/28/24 124 lb (56.2 kg)   10/03/24 121 lb (54.9 kg)  08/13/24 121 lb (54.9 kg)    GEN: Well nourished, well developed in no acute distress NECK: No JVD; No carotid bruits CARDIAC: RRR, no murmurs, rubs, gallops RESPIRATORY: Bibasilar crackles ABDOMEN: Soft, non-tender, non-distended EXTREMITIES:  No edema; No deformity   ASSESSMENT AND PLAN  Abnormal stress test: Previous cardiac catheterization in 2022 showed mild proximal LAD lesion.  Due to worsening chest discomfort, a Myoview  was ordered which came back abnormal.  Risk and the benefit of cardiac catheterization has been discussed with the patient who is willing to proceed with diagnostic cardiac catheterization.  The case was discussed with DOD Dr. Francyne as well, given her history of interstitial lung disease and COPD, we decided to add left and right heart cath.  Mitral valve prolapse: Seen on recent echocardiogram  Hyperlipidemia: On Zetia  and rosuvastatin   Interstitial lung disease: Bibasilar crackles on physical exam consistent with chronic interstitial lung disease.  Followed with pulmonology service.      Informed Consent   Shared Decision Making/Informed Consent The risks [stroke (1 in 1000), death (1 in 1000), kidney failure [usually temporary] (1 in 500), bleeding (1 in 200), allergic reaction [possibly serious] (1 in 200)], benefits (diagnostic support and management of coronary artery disease) and alternatives of a cardiac catheterization were discussed in detail with [name] and she is willing to proceed.     Dispo: Follow-up with cardiology service in 4 to 5 weeks.  Signed, Scot Ford, PA  "

## 2024-10-28 NOTE — Progress Notes (Unsigned)
 " Cardiology Office Note   Date:  10/29/2024  ID:  [name], [name] 20-Oct-1966, MRN [id number] PCP: Jaycee Greig PARAS, NP  [facility] Providers Cardiologist:  Aleene Passe, MD (Inactive)   -- Dr. Barbaraann  History of Present Illness [name] is a 58 y.o. female with past medical history of chronic intermittent chest pain, mild CAD by cath 2022, COPD, interstitial lung disease, hyperlipidemia, migraine, anxiety, MVP, tobacco abuse and mild carotid artery disease. She was previously evaluated in May 2022 for chest pain with mixed features. Her initial symptom was felt to be possibly pericarditis following COVID infection so she was treated with colchicine . Lexiscan  deferred due to severe wheezing on exam. Her symptom persisted, she eventually underwent cardiac catheterization in 03/2021 that showed 20% ostial to proximal LAD lesion with otherwise luminal irregularities, normal EF, normal LVEDP. Echocardiogram in July 2022 showed EF 60 to 65%, mild mitral valve prolapse, trivial MR. She has severe headache after sublingual nitroglycerin . She was seen in October 2024 for chest pain in the setting of increased caregiver stress while taking care of her terminally ill mother who passed away due to lung cancer. She has tried Bystolic  in the past however could not tolerated as she felt she was going to pass out. Patient was last seen by Lonell Bring, PA-C in December 2024, it was recommended for her to start on Ranexa  500 mg twice a day. Bystolic  was removed from her med list. She was still having intermittent chest discomfort, however given similarity of her chest discomfort was her symptom in 2022, suspicion for significant progression of coronary artery disease was very low. Subsequent echocardiogram obtained 11/29/2023 showed EF 60 to 65%, no regional wall motion abnormality, mild to moderate MR, moderate late systolic prolapse of the mid scallop of posterior leaflet of mitral valve. She quit smoking in  November 2024. Pulmonary function test in 2024 showed FEV1 down to 48%, this improved on the recent PFT in September 2025. FEV1 improved to 71%.   Patient was most recently seen by Dr. Geronimo of pulmonology service on 07/03/2024.  She continued to have cough and shortness of breath has been going on for the past several years.  Trelegy seems to have helped with her symptom as well as albuterol .   High-resolution CT showed pulmonary parenchymal pattern of interstitial lung disease disease and air trapping unchanged when compared to previous study in September 2024, most compatible with fibrotic hypersensitivity pneumonitis, age advanced three-vessel coronary artery calcification.  I last saw the patient on 08/13/2024 for increased chest discomfort and abnormal high-resolution CT imaging that suggested enlarged heart.  I recommended repeat echocardiogram and a PET stress test.  I reviewed her case with Dr. Barbaraann, due to heavy calcification, coronary CT was not ideal study.  Echocardiogram obtained on 10/03/2024 showed EF 60 to 65%, no regional wall motion abnormality, normal RV size and normal RV systolic pressure, mild prolapse of the posterior leaflet of mitral valve with mild to moderate MR.  PET stress test was turned down by insurance.  We ultimately proceeded with Lexiscan  Myoview  that showed intermediate risk study was medium defect of moderate reduction in uptake present in the apical to mid anteroseptal location that is reversible, this is consistent with area of ischemia, EF 64%.  Patient presents today for follow-up.  She continued to have multiple episode of chest pain per week.  Symptom lasted only a few minutes at a time and may occur both at rest and with exertion.  We reviewed the recent echocardiogram and Myoview , I also discussed her case with DOD Dr. Francyne, we recommend the patient proceed with a left and right heart cath.  The right heart cath portion will assess the degree of pulmonary  implication on her heart.  She will need a basic metabolic panel and a CBC.  She can follow-up with cardiology service in 4 to 5 weeks.    ROS:   Patient has chronic shortness of breath with exertion.  She continued to have intermittent chest discomfort.  She has no lower extremity edema, orthopnea or PND.   Studies Reviewed EKG Interpretation Date/Time:  Tuesday October 28 2024 15:20:07 EST Ventricular Rate:  60 PR Interval:  142 QRS Duration:  94 QT Interval:  406 QTC Calculation: 406 R Axis:   109  Text Interpretation: Normal sinus rhythm Rightward axis Incomplete right bundle branch block Chronic T wave inversion in the inferolateral leads Incomplete right bundle branch block is now Present Nonspecific T wave abnormality has replaced inverted T waves in Lateral leads Confirmed by Yechezkel Fertig [phone]) on 10/28/2024 3:28:50 PM    Cardiac Studies & Procedures   ______________________________________________________________________________________________ CARDIAC CATHETERIZATION  CARDIAC CATHETERIZATION 04/13/2021  Conclusion  Ost LAD to Prox LAD lesion is 20% stenosed.  Otherwise minimal luminal irregularities  The left ventricular systolic function is normal.  LV end diastolic pressure is normal.  The left ventricular ejection fraction is 55-65% by visual estimate.  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  Otherwise minimal CAD of the LCx or RCA  Normal LVEF & LVEDP   RECOMMENDATION  Consider Microvascular Disease as cause of symptoms concerning for Angina  Did not start Ranexa  due to potential medication interaction  Follow-up with Primary Cardiologist    Alm Clay, MD  Findings Coronary Findings Diagnostic  Dominance: Right  Left Main Vessel was injected. Vessel is moderate in size. The vessel exhibits minimal luminal irregularities.  Left Anterior Descending Ost LAD to Prox  LAD lesion is 20% stenosed. The lesion is eccentric. The lesion is calcified. Mostly Positive Remodeling - Calcium  seen on Non-Injection Imaging.  First Diagonal Branch Vessel is small in size.  Second Septal Branch Vessel is small in size.  Third Diagonal Branch Vessel is small in size.  Left Circumflex The vessel exhibits minimal luminal irregularities.  First Obtuse Marginal Branch Vessel is small in size.  Left Posterior Atrioventricular Artery Vessel is small in size.  Right Coronary Artery Vessel was injected. Vessel is normal in caliber. The vessel exhibits minimal luminal irregularities. The vessel is mildly tortuous.  Acute Marginal Branch Vessel is small in size.  Right Ventricular Branch Vessel is small in size.  Right Posterior Descending Artery Vessel is small in size.  First Right Posterolateral Branch Vessel is small in size.  Second Right Posterolateral Branch Vessel is moderate in size.  Third Right Posterolateral Branch Vessel is small in size.  Intervention  No interventions have been documented.   STRESS TESTS  MYOCARDIAL PERFUSION IMAGING 10/03/2024  Interpretation Summary   Findings are consistent with ischemia. The study is intermediate risk.   No ST deviation was noted.   LV perfusion is abnormal. There is evidence of ischemia. There is no evidence of infarction. Defect 1: There is a medium defect with moderate reduction in uptake present in the apical to mid anteroseptal location(s) that is reversible. There is normal wall motion in the defect area. Consistent with ischemia.  Left ventricular function is normal. Nuclear stress EF: 64%. The left ventricular ejection fraction is normal (55-65%). End diastolic cavity size is normal. End systolic cavity size is normal. No evidence of transient ischemic dilation (TID) noted.   CT images were obtained for attenuation correction and were examined for the presence of coronary calcium  when  appropriate.   Coronary calcium  was present on the attenuation correction CT images. Moderate coronary calcifications were present. Coronary calcifications were present in the left anterior descending artery, left circumflex artery and right coronary artery distribution(s).   Prior study not available for comparison.   ECHOCARDIOGRAM  ECHOCARDIOGRAM COMPLETE 10/03/2024  Narrative ECHOCARDIOGRAM REPORT    Patient Name:   [name]  Date of Exam: 10/03/2024 Medical Rec #:  [id number]     Height:       60.3 in Accession #:    [id number]    Weight:       121.0 lb Date of Birth:  [birth year]    BSA:          1.513 m Patient Age:    58 years      BP:           102/70 mmHg Patient Gender: F             HR:           55 bpm. Exam Location:  [address]  Procedure: 2D Echo, Cardiac Doppler and Color Doppler (Both Spectral and Color Flow Doppler were utilized during procedure).  Indications:    R07.9 Chest pain  History:        Patient has prior history of Echocardiogram examinations, most recent 11/29/2023. Previous Myocardial Infarction, COPD, Mitral Valve Prolapse, Signs/Symptoms:Chest Pain; Risk Factors:Dyslipidemia and Former Smoker.  Sonographer:    Elsie Bohr RDCS Referring Phys: [phone] Rollie Hynek  IMPRESSIONS   1. Left ventricular ejection fraction, by estimation, is 60 to 65%. The left ventricle has normal function. The left ventricle has no regional wall motion abnormalities. Left ventricular diastolic parameters are indeterminate. 2. Right ventricular systolic function is normal. The right ventricular size is normal. There is normal pulmonary artery systolic pressure. 3. The mitral valve is normal in structure. Late peaking mild mitral valve regurgitation. No evidence of mitral stenosis. There is mild prolapse of posterior leaflet of the mitral valve. 4. The aortic valve is normal in structure. Aortic valve regurgitation is not visualized. No aortic stenosis is  present. 5. The inferior vena cava is normal in size with greater than 50% respiratory variability, suggesting right atrial pressure of 3 mmHg.  Comparison(s): A prior study was performed on 11/29/2023. LVEF 60-65%, mild to moderate MR, posterior mitral valve prolapse.  FINDINGS Left Ventricle: Left ventricular ejection fraction, by estimation, is 60 to 65%. The left ventricle has normal function. The left ventricle has no regional wall motion abnormalities. The left ventricular internal cavity size was normal in size. There is no left ventricular hypertrophy. Left ventricular diastolic parameters are indeterminate.  Right Ventricle: The right ventricular size is normal. No increase in right ventricular wall thickness. Right ventricular systolic function is normal. There is normal pulmonary artery systolic pressure. The tricuspid regurgitant velocity is 2.04 m/s, and with an assumed right atrial pressure of 3 mmHg, the estimated right ventricular systolic pressure is 19.6 mmHg.  Left Atrium: Left atrial size was normal in size.  Right Atrium: Right atrial size was normal in size.  Pericardium: There is no evidence of pericardial effusion.  Mitral Valve: The  mitral valve is normal in structure. There is mild prolapse of posterior leaflet of the mitral valve. Late peaking mild mitral valve regurgitation. No evidence of mitral valve stenosis.  Tricuspid Valve: The tricuspid valve is normal in structure. Tricuspid valve regurgitation is trivial. No evidence of tricuspid stenosis.  Aortic Valve: The aortic valve is normal in structure. Aortic valve regurgitation is not visualized. No aortic stenosis is present.  Pulmonic Valve: The pulmonic valve was normal in structure. Pulmonic valve regurgitation is mild. No evidence of pulmonic stenosis.  Aorta: The aortic root and ascending aorta are structurally normal, with no evidence of dilitation.  Venous: The inferior vena cava is normal in size  with greater than 50% respiratory variability, suggesting right atrial pressure of 3 mmHg.  IAS/Shunts: No atrial level shunt detected by color flow Doppler.   LEFT VENTRICLE PLAX 2D LVIDd:         4.60 cm   Diastology LVIDs:         2.80 cm   LV e' medial:    6.31 cm/s LV PW:         0.90 cm   LV E/e' medial:  17.6 LV IVS:        0.90 cm   LV e' lateral:   8.70 cm/s LVOT diam:     1.90 cm   LV E/e' lateral: 12.8 LV SV:         58 LV SV Index:   39 LVOT Area:     2.84 cm   RIGHT VENTRICLE             IVC RV S prime:     11.30 cm/s  IVC diam: 1.30 cm TAPSE (M-mode): 2.0 cm RVSP:           19.6 mmHg  LEFT ATRIUM             Index        RIGHT ATRIUM           Index LA diam:        3.80 cm 2.51 cm/m   RA Pressure: 3.00 mmHg LA Vol (A2C):   44.4 ml 29.34 ml/m  RA Area:     12.10 cm LA Vol (A4C):   34.8 ml 22.99 ml/m  RA Volume:   26.60 ml  17.58 ml/m LA Biplane Vol: 41.4 ml 27.36 ml/m AORTIC VALVE LVOT Vmax:   91.80 cm/s LVOT Vmean:  56.400 cm/s LVOT VTI:    0.206 m  AORTA Ao Root diam: 3.10 cm Ao Asc diam:  3.30 cm  MITRAL VALVE                TRICUSPID VALVE MV Area (PHT): 3.61 cm     TR Peak grad:   16.6 mmHg MV Decel Time: 210 msec     TR Vmax:        204.00 cm/s MV E velocity: 111.00 cm/s  Estimated RAP:  3.00 mmHg MV A velocity: 67.10 cm/s   RVSP:           19.6 mmHg MV E/A ratio:  1.65 SHUNTS Systemic VTI:  0.21 m Systemic Diam: 1.90 cm  Sunit Tolia Electronically signed by Madonna Large Signature Date/Time: 10/03/2024/3:09:01 PM    Final          ______________________________________________________________________________________________      Risk Assessment/Calculations           Physical Exam VS:  BP 106/62 (BP Location: Left Arm, Patient Position: Sitting, Cuff Size:  Normal)   Pulse 60   Ht 5' (1.524 m)   Wt 124 lb (56.2 kg)   SpO2 92%   BMI 24.22 kg/m        Wt Readings from Last 3 Encounters:  10/28/24 124 lb (56.2 kg)   10/03/24 121 lb (54.9 kg)  08/13/24 121 lb (54.9 kg)    GEN: Well nourished, well developed in no acute distress NECK: No JVD; No carotid bruits CARDIAC: RRR, no murmurs, rubs, gallops RESPIRATORY: Bibasilar crackles ABDOMEN: Soft, non-tender, non-distended EXTREMITIES:  No edema; No deformity   ASSESSMENT AND PLAN  Abnormal stress test: Previous cardiac catheterization in 2022 showed mild proximal LAD lesion.  Due to worsening chest discomfort, a Myoview  was ordered which came back abnormal.  Risk and the benefit of cardiac catheterization has been discussed with the patient who is willing to proceed with diagnostic cardiac catheterization.  The case was discussed with DOD Dr. Francyne as well, given her history of interstitial lung disease and COPD, we decided to add left and right heart cath.  Mitral valve prolapse: Seen on recent echocardiogram  Hyperlipidemia: On Zetia  and rosuvastatin   Interstitial lung disease: Bibasilar crackles on physical exam consistent with chronic interstitial lung disease.  Followed with pulmonology service.      Informed Consent   Shared Decision Making/Informed Consent The risks [stroke (1 in 1000), death (1 in 1000), kidney failure [usually temporary] (1 in 500), bleeding (1 in 200), allergic reaction [possibly serious] (1 in 200)], benefits (diagnostic support and management of coronary artery disease) and alternatives of a cardiac catheterization were discussed in detail with [name] and she is willing to proceed.     Dispo: Follow-up with cardiology service in 4 to 5 weeks.  Signed, Scot Ford, PA  "

## 2024-10-29 ENCOUNTER — Other Ambulatory Visit: Payer: Self-pay | Admitting: Physician Assistant

## 2024-10-29 ENCOUNTER — Ambulatory Visit: Payer: Self-pay | Admitting: Physician Assistant

## 2024-10-29 NOTE — Progress Notes (Signed)
 Normal red blood cell count. Kidney function stable. Recommend increased fluid intake for 2 days prior to cath.

## 2024-11-07 ENCOUNTER — Ambulatory Visit

## 2024-11-10 ENCOUNTER — Telehealth: Payer: Self-pay | Admitting: *Deleted

## 2024-11-10 NOTE — Telephone Encounter (Signed)
 Cardiac Catheterization scheduled at [facility] for: Tuesday November 11, 2024 7:30 AM Arrival time [facility] Main Entrance A at: 5:30 AM  Diet: -Nothing to eat after midnight.  Hydration: -May drink clear liquids until 2 hours before the procedure.  Approved liquids: Water , clear tea, black coffee, fruit juices-non-citric and without pulp,Gatorade, plain Jello/popsicles.   -Please drink 16 oz of water  2 hours before procedure.  Medication instructions: -Usual morning medications can be taken including aspirin  81 mg.  Plan to go home the same day, you will only stay overnight if medically necessary.  You must have responsible adult to drive you home.  Someone must be with you the first 24 hours after you arrive home.  Reviewed procedure instructions with patient.

## 2024-11-11 ENCOUNTER — Encounter (HOSPITAL_COMMUNITY): Payer: Self-pay | Admitting: Cardiology

## 2024-11-11 ENCOUNTER — Other Ambulatory Visit: Payer: Self-pay

## 2024-11-11 ENCOUNTER — Ambulatory Visit (HOSPITAL_COMMUNITY)
Admission: RE | Admit: 2024-11-11 | Discharge: 2024-11-11 | Disposition: A | Attending: Cardiology | Admitting: Cardiology

## 2024-11-11 ENCOUNTER — Encounter (HOSPITAL_COMMUNITY)
Admission: RE | Disposition: A | Discharge status: Home / Self Care | Payer: Self-pay | Source: Home / Self Care | Attending: Cardiology

## 2024-11-11 DIAGNOSIS — E785 Hyperlipidemia, unspecified: Secondary | ICD-10-CM | POA: Insufficient documentation

## 2024-11-11 DIAGNOSIS — Z79899 Other long term (current) drug therapy: Secondary | ICD-10-CM | POA: Diagnosis not present

## 2024-11-11 DIAGNOSIS — R9439 Abnormal result of other cardiovascular function study: Secondary | ICD-10-CM | POA: Diagnosis present

## 2024-11-11 DIAGNOSIS — I341 Nonrheumatic mitral (valve) prolapse: Secondary | ICD-10-CM | POA: Insufficient documentation

## 2024-11-11 DIAGNOSIS — I2584 Coronary atherosclerosis due to calcified coronary lesion: Secondary | ICD-10-CM | POA: Insufficient documentation

## 2024-11-11 DIAGNOSIS — Z7982 Long term (current) use of aspirin: Secondary | ICD-10-CM | POA: Insufficient documentation

## 2024-11-11 DIAGNOSIS — J849 Interstitial pulmonary disease, unspecified: Secondary | ICD-10-CM | POA: Insufficient documentation

## 2024-11-11 DIAGNOSIS — J449 Chronic obstructive pulmonary disease, unspecified: Secondary | ICD-10-CM | POA: Insufficient documentation

## 2024-11-11 DIAGNOSIS — I25118 Atherosclerotic heart disease of native coronary artery with other forms of angina pectoris: Secondary | ICD-10-CM | POA: Diagnosis not present

## 2024-11-11 DIAGNOSIS — Z87891 Personal history of nicotine dependence: Secondary | ICD-10-CM | POA: Diagnosis not present

## 2024-11-11 HISTORY — PX: RIGHT/LEFT HEART CATH AND CORONARY ANGIOGRAPHY: CATH118266

## 2024-11-11 LAB — POCT I-STAT EG7
Acid-Base Excess: 1 mmol/L (ref 0.0–2.0)
Acid-base deficit: 1 mmol/L (ref 0.0–2.0)
Bicarbonate: 24.3 mmol/L (ref 20.0–28.0)
Bicarbonate: 26.4 mmol/L (ref 20.0–28.0)
Calcium, Ion: 1.23 mmol/L (ref 1.15–1.40)
Calcium, Ion: 1.25 mmol/L (ref 1.15–1.40)
HCT: 37 % (ref 36.0–46.0)
HCT: 37 % (ref 36.0–46.0)
Hemoglobin: 12.6 g/dL (ref 12.0–15.0)
Hemoglobin: 12.6 g/dL (ref 12.0–15.0)
O2 Saturation: 72 %
O2 Saturation: 76 %
Potassium: 3.1 mmol/L — ABNORMAL LOW (ref 3.5–5.1)
Potassium: 3.2 mmol/L — ABNORMAL LOW (ref 3.5–5.1)
Sodium: 140 mmol/L (ref 135–145)
Sodium: 142 mmol/L (ref 135–145)
TCO2: 26 mmol/L (ref 22–32)
TCO2: 28 mmol/L (ref 22–32)
pCO2, Ven: 42.4 mmHg — ABNORMAL LOW (ref 44–60)
pCO2, Ven: 42.9 mmHg — ABNORMAL LOW (ref 44–60)
pH, Ven: 7.36 (ref 7.25–7.43)
pH, Ven: 7.403 (ref 7.25–7.43)
pO2, Ven: 40 mmHg (ref 32–45)
pO2, Ven: 41 mmHg (ref 32–45)

## 2024-11-11 LAB — POCT I-STAT 7, (LYTES, BLD GAS, ICA,H+H)
Acid-base deficit: 1 mmol/L (ref 0.0–2.0)
Bicarbonate: 23.4 mmol/L (ref 20.0–28.0)
Calcium, Ion: 1.24 mmol/L (ref 1.15–1.40)
HCT: 37 % (ref 36.0–46.0)
Hemoglobin: 12.6 g/dL (ref 12.0–15.0)
O2 Saturation: 96 %
Potassium: 3.1 mmol/L — ABNORMAL LOW (ref 3.5–5.1)
Sodium: 142 mmol/L (ref 135–145)
TCO2: 25 mmol/L (ref 22–32)
pCO2 arterial: 38.4 mmHg (ref 32–48)
pH, Arterial: 7.394 (ref 7.35–7.45)
pO2, Arterial: 86 mmHg (ref 83–108)

## 2024-11-11 MED ORDER — LABETALOL HCL 5 MG/ML IV SOLN: 10.0000 mg | INTRAVENOUS | Status: DC | PRN

## 2024-11-11 MED ORDER — SODIUM CHLORIDE 0.9% FLUSH: 3.0000 mL | Freq: Two times a day (BID) | INTRAVENOUS | Status: DC

## 2024-11-11 MED ORDER — HYDRALAZINE HCL 20 MG/ML IJ SOLN: 10.0000 mg | INTRAMUSCULAR | Status: DC | PRN

## 2024-11-11 MED ORDER — SODIUM CHLORIDE 0.9 % IV SOLN: 250.0000 mL | INTRAVENOUS | Status: DC | PRN

## 2024-11-11 MED ORDER — ONDANSETRON HCL 4 MG/2ML IJ SOLN: 4.0000 mg | Freq: Four times a day (QID) | INTRAMUSCULAR | Status: DC | PRN

## 2024-11-11 MED ORDER — HEPARIN SODIUM (PORCINE) 1000 UNIT/ML IJ SOLN
INTRAMUSCULAR | Status: DC | PRN
  Administered 2024-11-11: 3000 [IU] via INTRAVENOUS

## 2024-11-11 MED ORDER — FENTANYL CITRATE (PF) 100 MCG/2ML IJ SOLN
INTRAMUSCULAR | Status: AC
  Filled 2024-11-11: qty 2

## 2024-11-11 MED ORDER — MIDAZOLAM HCL (PF) 2 MG/2ML IJ SOLN
INTRAMUSCULAR | Status: DC | PRN
  Administered 2024-11-11: 1 mg via INTRAVENOUS

## 2024-11-11 MED ORDER — SODIUM CHLORIDE 0.9% FLUSH: 3.0000 mL | INTRAVENOUS | Status: DC | PRN

## 2024-11-11 MED ORDER — IOHEXOL 350 MG/ML SOLN
INTRAVENOUS | Status: DC | PRN
  Administered 2024-11-11: 40 mL

## 2024-11-11 MED ORDER — VERAPAMIL HCL 2.5 MG/ML IV SOLN
INTRAVENOUS | Status: AC
  Filled 2024-11-11: qty 2

## 2024-11-11 MED ORDER — LIDOCAINE HCL (PF) 1 % IJ SOLN
INTRAMUSCULAR | Status: AC
  Filled 2024-11-11: qty 30

## 2024-11-11 MED ORDER — LIDOCAINE HCL (PF) 1 % IJ SOLN
INTRAMUSCULAR | Status: DC | PRN
  Administered 2024-11-11: 5 mL via INTRADERMAL

## 2024-11-11 MED ORDER — MIDAZOLAM HCL 2 MG/2ML IJ SOLN
INTRAMUSCULAR | Status: AC
  Filled 2024-11-11: qty 2

## 2024-11-11 MED ORDER — ASPIRIN 81 MG PO CHEW: 81.0000 mg | CHEWABLE_TABLET | ORAL | Status: DC

## 2024-11-11 MED ORDER — FREE WATER: 500.0000 mL | Freq: Once | Status: DC

## 2024-11-11 MED ORDER — FENTANYL CITRATE (PF) 100 MCG/2ML IJ SOLN
INTRAMUSCULAR | Status: DC | PRN
  Administered 2024-11-11: 25 ug via INTRAVENOUS

## 2024-11-11 MED ORDER — VERAPAMIL HCL 2.5 MG/ML IV SOLN
INTRAVENOUS | Status: DC | PRN
  Administered 2024-11-11: 10 mL via INTRA_ARTERIAL

## 2024-11-11 MED ORDER — HEPARIN (PORCINE) IN NACL 1000-0.9 UT/500ML-% IV SOLN
INTRAVENOUS | Status: DC | PRN
  Administered 2024-11-11: 1000 mL

## 2024-11-11 MED ORDER — HEPARIN SODIUM (PORCINE) 1000 UNIT/ML IJ SOLN
INTRAMUSCULAR | Status: AC
  Filled 2024-11-11: qty 10

## 2024-11-11 NOTE — Progress Notes (Signed)
 Discharge instructions reviewed with patient and friend at bedside. Denies questions or concerns. PT tolerated PO intake. Incision site remains clean dry and intact. No s/s of complications. PT escorted from the unit via wheel chair to personal vehicle.

## 2024-11-11 NOTE — Discharge Instructions (Signed)
 Radial Site Care The following information offers guidance on how to care for yourself after your procedure. Your health care provider may also give you more specific instructions. If you have problems or questions, contact your health care provider. What can I expect after the procedure? After the procedure, it is common to have bruising and tenderness in the incision area. Follow these instructions at home: Incision site care  Follow instructions from your health care provider about how to take care of your incision site. Make sure you: Wash your hands with soap and water  for at least 20 seconds before and after you change your bandage (dressing). If soap and water  are not available, use hand sanitizer. Remove your dressing in 24 hours. Leave stitches (sutures), skin glue, or adhesive strips in place. These skin closures may need to stay in place for 2 weeks or longer. If adhesive strip edges start to loosen and curl up, you may trim the loose edges. Do not remove adhesive strips completely unless your health care provider tells you to do that. Do not take baths, swim, or use a hot tub for at least 1 week. You may shower 24 hours after the procedure or as told by your health care provider. Remove the dressing and gently wash the incision area with plain soap and water . Pat the area dry with a clean towel. Do not rub the site. That could cause bleeding. Do not apply powder or lotion to the site. Check your incision site every day for signs of infection. Check for: Redness, swelling, or pain. Fluid or blood. Warmth. Pus or a bad smell. Activity For 24 hours after the procedure, or as directed by your health care provider: Do not flex or bend the affected arm. Do not push or pull heavy objects with the affected arm. Do not operate machinery or power tools. Do not drive. You should not drive yourself home from the hospital or clinic if you go home during that time period. You may drive 24  hours after the procedure unless your health care provider tells you not to. Do not lift anything that is heavier than 10 lb (4.5 kg), or the limit that you are told, until your health care provider says that it is safe. Return to your normal activities as told by your health care provider. Ask your health care provider what activities are safe for you and when you can return to work. If you were given a sedative during the procedure, it can affect you for several hours. Do not drive or operate machinery until your health care provider says that it is safe. General instructions Take over-the-counter and prescription medicines only as told by your health care provider. If you will be going home right after the procedure, plan to have a responsible adult care for you for the time you are told. This is important. Keep all follow-up visits. This is important. Contact a health care provider if: You have a fever or chills. You have any of these signs of infection at your incision site: Redness, swelling, or pain. Fluid or blood. Warmth. Pus or a bad smell. Get help right away if: The incision area swells very fast. The incision area is bleeding, and the bleeding does not stop when you hold steady pressure on the area. Your arm or hand becomes pale, cool, tingly, or numb. These symptoms may represent a serious problem that is an emergency. Do not wait to see if the symptoms will go away. Get medical  help right away. Call your local emergency services (911 in the [place] Do not drive yourself to the hospital. Summary After the procedure, it is common to have bruising and tenderness at the incision site. Follow instructions from your health care provider about how to take care of your radial site incision. Check the incision every day for signs of infection. Do not lift anything that is heavier than 10 lb (4.5 kg), or the limit that you are told, until your health care provider says that it is  safe. Get help right away if the incision area swells very fast, you have bleeding at the incision site that will not stop, or your arm or hand becomes pale, cool, or numb. This information is not intended to replace advice given to you by your health care provider. Make sure you discuss any questions you have with your health care provider. Document Revised: 12/05/2020 Document Reviewed: 12/05/2020 Elsevier Patient Education  2024 [employer]Brachial Site Care   This sheet gives you information about how to care for yourself after your procedure. Your health care provider may also give you more specific instructions. If you have problems or questions, contact your health care provider. What can I expect after the procedure? After the procedure, it is common to have: Bruising and tenderness at the catheter insertion area. Follow these instructions at home:  Insertion site care Follow instructions from your health care provider about how to take care of your insertion site. Make sure you: Wash your hands with soap and water  before you change your bandage (dressing). If soap and water  are not available, use hand sanitizer. Remove your dressing as told by your health care provider. In 24 hours Check your insertion site every day for signs of infection. Check for: Redness, swelling, or pain. Pus or a bad smell. Warmth. You may shower 24-48 hours after the procedure. Do not apply powder or lotion to the site.  Activity For 24 hours after the procedure, or as directed by your health care provider: Do not push or pull heavy objects with the affected arm. Do not drive yourself home from the hospital or clinic. You may drive 24 hours after the procedure unless your health care provider tells you not to. Do not lift anything that is heavier than 10 lb (4.5 kg), or the limit that you are told, until your health care provider says that it is safe.  For 24 hours

## 2024-11-11 NOTE — Interval H&P Note (Signed)
 History and Physical Interval Note:  11/11/2024 7:23 AM  [name]  has presented today for surgery, with the diagnosis of Abnormal Stress Test.  The various methods of treatment have been discussed with the patient and family. After consideration of risks, benefits and other options for treatment, the patient has consented to  Procedures: RIGHT/LEFT HEART CATH AND CORONARY ANGIOGRAPHY (N/A)  PERCUTANEOUS CORONARY INTERVENTION  as a surgical intervention.  The patient's history has been reviewed, patient examined, no change in status, stable for surgery.  I have reviewed the patient's chart and labs.  Questions were answered to the patient's satisfaction.    Cath Lab Visit (complete for each Cath Lab visit)  Clinical Evaluation Leading to the Procedure:   ACS: No.  Non-ACS:    Anginal Classification: CCS III  Anti-ischemic medical therapy: Minimal Therapy (1 class of medications)  Non-Invasive Test Results: Intermediate-risk stress test findings: cardiac mortality 1-3%/year  Prior CABG: No previous CABG    Alm Clay

## 2024-11-13 ENCOUNTER — Other Ambulatory Visit

## 2024-11-16 ENCOUNTER — Other Ambulatory Visit: Payer: Self-pay | Admitting: Internal Medicine

## 2024-11-16 DIAGNOSIS — R053 Chronic cough: Secondary | ICD-10-CM

## 2024-11-16 DIAGNOSIS — J449 Chronic obstructive pulmonary disease, unspecified: Secondary | ICD-10-CM

## 2024-11-28 ENCOUNTER — Ambulatory Visit: Payer: Self-pay | Admitting: Family

## 2024-12-05 ENCOUNTER — Ambulatory Visit

## 2024-12-17 ENCOUNTER — Ambulatory Visit: Admitting: Physician Assistant

## 2025-01-05 ENCOUNTER — Encounter

## 2025-01-05 ENCOUNTER — Ambulatory Visit: Admitting: Internal Medicine

## 2025-02-02 ENCOUNTER — Encounter: Admitting: Family
